# Patient Record
Sex: Male | Born: 1975 | Race: White | Hispanic: No | Marital: Married | Smoking: Former smoker
Health system: Southern US, Community
[De-identification: ages and names within clinical notes are randomized; demographics above are authoritative.]

## PROBLEM LIST (undated history)

## (undated) DIAGNOSIS — M549 Dorsalgia, unspecified: Secondary | ICD-10-CM

## (undated) DIAGNOSIS — C3490 Malignant neoplasm of unspecified part of unspecified bronchus or lung: Secondary | ICD-10-CM

## (undated) DIAGNOSIS — L309 Dermatitis, unspecified: Secondary | ICD-10-CM

## (undated) DIAGNOSIS — K219 Gastro-esophageal reflux disease without esophagitis: Secondary | ICD-10-CM

## (undated) DIAGNOSIS — G8929 Other chronic pain: Secondary | ICD-10-CM

## (undated) HISTORY — PX: OTHER SURGICAL HISTORY: SHX169

## (undated) HISTORY — PX: WISDOM TOOTH EXTRACTION: SHX21

## (undated) HISTORY — PX: PORTACATH PLACEMENT: SHX2246

---

## 2006-07-18 ENCOUNTER — Ambulatory Visit (HOSPITAL_COMMUNITY): Admission: RE | Admit: 2006-07-18 | Discharge: 2006-07-18 | Payer: Self-pay | Admitting: Family Medicine

## 2011-07-09 ENCOUNTER — Emergency Department (HOSPITAL_COMMUNITY)
Admission: EM | Admit: 2011-07-09 | Discharge: 2011-07-09 | Disposition: A | Discharge status: Home / Self Care | Payer: Worker's Compensation | Attending: Emergency Medicine | Admitting: Emergency Medicine

## 2011-07-09 ENCOUNTER — Emergency Department (HOSPITAL_COMMUNITY): Payer: Worker's Compensation

## 2011-07-09 ENCOUNTER — Encounter: Payer: Self-pay | Admitting: *Deleted

## 2011-07-09 DIAGNOSIS — S0180XA Unspecified open wound of other part of head, initial encounter: Secondary | ICD-10-CM | POA: Insufficient documentation

## 2011-07-09 DIAGNOSIS — S0083XA Contusion of other part of head, initial encounter: Secondary | ICD-10-CM

## 2011-07-09 DIAGNOSIS — S0003XA Contusion of scalp, initial encounter: Secondary | ICD-10-CM | POA: Insufficient documentation

## 2011-07-09 DIAGNOSIS — S0181XA Laceration without foreign body of other part of head, initial encounter: Secondary | ICD-10-CM

## 2011-07-09 DIAGNOSIS — S1093XA Contusion of unspecified part of neck, initial encounter: Secondary | ICD-10-CM | POA: Insufficient documentation

## 2011-07-09 DIAGNOSIS — IMO0002 Reserved for concepts with insufficient information to code with codable children: Secondary | ICD-10-CM | POA: Insufficient documentation

## 2011-07-09 DIAGNOSIS — F17200 Nicotine dependence, unspecified, uncomplicated: Secondary | ICD-10-CM | POA: Insufficient documentation

## 2011-07-09 MED ORDER — TETANUS-DIPHTH-ACELL PERTUSSIS 5-2.5-18.5 LF-MCG/0.5 IM SUSP
0.5000 mL | Freq: Once | INTRAMUSCULAR | Status: AC
  Administered 2011-07-09: 0.5 mL via INTRAMUSCULAR
  Filled 2011-07-09: qty 0.5

## 2011-07-09 NOTE — ED Notes (Signed)
Pt states was using a pri-bar bar at work when [employer] bar came loose and hit pt in nose causing a small laceration on lt side of nare and noted swelling at the bridge of nose. Denies LOC.

## 2011-07-09 NOTE — ED Provider Notes (Signed)
History     CSN: [id number] Arrival date & time: 07/09/2011  3:00 PM  Chief Complaint  Patient presents with  . Facial Injury   HPI Comments: Was using a prybar to open a door which slipped and struck him in the face.  Caused a small laceration to the left side of the nose.  Complains of nasal pain, swelling.  Last tdap 7 years ago.  No loc.  No other complaints.  Patient is a 35 y.o. male presenting with facial injury. The history is provided by the patient.  Facial Injury  The incident occurred just prior to arrival. The incident occurred at work. The injury mechanism was a direct blow. Pertinent negatives include no headaches and no light-headedness.    History reviewed. No pertinent past medical history.  History reviewed. No pertinent past surgical history.  History reviewed. No pertinent family history.  History  Substance Use Topics  . Smoking status: Current Everyday Smoker  . Smokeless tobacco: Not on file  . Alcohol Use: No      Review of Systems  Constitutional: Negative for chills and fatigue.  HENT: Positive for facial swelling.        As above  Respiratory: Negative for shortness of breath.   Neurological: Negative for dizziness, light-headedness and headaches.    Physical Exam  BP 121/76  Pulse 78  Temp(Src) 98 F (36.7 C) (Oral)  Resp 20  Ht 5\' 10"  (1.778 m)  Wt 180 lb (81.647 kg)  BMI 25.83 kg/m2  SpO2 100%  Physical Exam  Constitutional: He is oriented to person, place, and time. He appears well-developed and well-nourished. No distress.  HENT:  Head: Normocephalic.       There is a small laceration to the left side of the nose.  There is no deformity otherwise and there is no septal hematoma.    Eyes: EOM are normal. Pupils are equal, round, and reactive to light.  Neck: Normal range of motion. Neck supple.  Neurological: He is alert and oriented to person, place, and time.  Skin: He is not diaphoretic.    ED Course  Procedures  MDM No  fracture.  No sutures indicated.  Will discharge.      Geoffery Lyons, MD 07/09/11 [phone]

## 2011-07-09 NOTE — ED Notes (Signed)
Pt was working with a piece of equipment  when it flew back and hit pt in his nose, pt has laceration to left side of nose, no bleeding noted, denies any loc,

## 2013-01-30 ENCOUNTER — Emergency Department (HOSPITAL_COMMUNITY)
Admission: EM | Admit: 2013-01-30 | Discharge: 2013-01-30 | Disposition: A | Discharge status: Home / Self Care | Payer: Managed Care, Other (non HMO) | Attending: Emergency Medicine | Admitting: Emergency Medicine

## 2013-01-30 ENCOUNTER — Encounter (HOSPITAL_COMMUNITY): Payer: Self-pay

## 2013-01-30 DIAGNOSIS — T394X5A Adverse effect of antirheumatics, not elsewhere classified, initial encounter: Secondary | ICD-10-CM | POA: Insufficient documentation

## 2013-01-30 DIAGNOSIS — R21 Rash and other nonspecific skin eruption: Secondary | ICD-10-CM | POA: Insufficient documentation

## 2013-01-30 DIAGNOSIS — Z79899 Other long term (current) drug therapy: Secondary | ICD-10-CM | POA: Insufficient documentation

## 2013-01-30 DIAGNOSIS — T7840XA Allergy, unspecified, initial encounter: Secondary | ICD-10-CM

## 2013-01-30 DIAGNOSIS — Z872 Personal history of diseases of the skin and subcutaneous tissue: Secondary | ICD-10-CM | POA: Insufficient documentation

## 2013-01-30 DIAGNOSIS — L509 Urticaria, unspecified: Secondary | ICD-10-CM | POA: Insufficient documentation

## 2013-01-30 DIAGNOSIS — F17200 Nicotine dependence, unspecified, uncomplicated: Secondary | ICD-10-CM | POA: Insufficient documentation

## 2013-01-30 DIAGNOSIS — Z8739 Personal history of other diseases of the musculoskeletal system and connective tissue: Secondary | ICD-10-CM | POA: Insufficient documentation

## 2013-01-30 HISTORY — DX: Dorsalgia, unspecified: M54.9

## 2013-01-30 HISTORY — DX: Other chronic pain: G89.29

## 2013-01-30 HISTORY — DX: Dermatitis, unspecified: L30.9

## 2013-01-30 MED ORDER — DIPHENHYDRAMINE HCL 25 MG PO TABS: 25.0000 mg | ORAL_TABLET | Freq: Four times a day (QID) | ORAL | Status: DC | PRN

## 2013-01-30 MED ORDER — FAMOTIDINE 20 MG PO TABS
20.0000 mg | ORAL_TABLET | Freq: Once | ORAL | Status: AC
  Administered 2013-01-30: 20 mg via ORAL
  Filled 2013-01-30: qty 1

## 2013-01-30 MED ORDER — PREDNISONE 50 MG PO TABS
60.0000 mg | ORAL_TABLET | Freq: Once | ORAL | Status: AC
  Administered 2013-01-30: 60 mg via ORAL
  Filled 2013-01-30: qty 1

## 2013-01-30 MED ORDER — PREDNISONE 20 MG PO TABS: 40.0000 mg | ORAL_TABLET | Freq: Every day | ORAL | Status: DC

## 2013-01-30 MED ORDER — DIPHENHYDRAMINE HCL 25 MG PO CAPS
50.0000 mg | ORAL_CAPSULE | Freq: Once | ORAL | Status: AC
  Administered 2013-01-30: 50 mg via ORAL
  Filled 2013-01-30: qty 2

## 2013-01-30 MED ORDER — FAMOTIDINE 20 MG PO TABS: 20.0000 mg | ORAL_TABLET | Freq: Two times a day (BID) | ORAL | Status: DC

## 2013-01-30 NOTE — ED Provider Notes (Signed)
History  This chart was scribed for Cameron Roller, MD by Shari Heritage, ED Scribe. The patient was seen in room APA08/APA08. Patient's care was started at 0911.   CSN: [id number]  Arrival date & time 01/30/13  0902   First MD Initiated Contact with Patient 01/30/13 [phone]      Chief Complaint  Patient presents with  . Allergic Reaction     The history is provided by the patient. No language interpreter was used.    HPI Comments: Cameron Harper is a 37 y.o. male who presents to the Emergency Department complaining of a diffuse itching to arms, legs, chest and back; and hives to abdomen onset 2 hours ago. He has a history of eczema, but thinks that new urticarial rash may have developed due to medication reaction. Patient was prescribed diclofenac last year by his neurologist for treatment of chronic back pain. He says that he took the medicine this morning at 7 am for the first time in 6 months. He attributes current symptoms to this medicine. He denies use of any other medicines, new soaps, fabrics, lotions or detergents. He states that he hasn't eaten any new or unusual foods in the past several hours. No exposure to new animals or pets. He has no shortness of breath, chest tightness, throat swelling, trouble swallowing, or fever. Patient also denies chills, wheezing, chest pain, hematuria, blood in stool, abdominal pain, history of bleeding easily, blurred vision, myalgias or arthralgias. He has not taken any benadryl for symptom relief. Patient lives at home with his wife and two kids. He states that they are all asymptomatic. Patient is a current every day smoker. He denies drug or alcohol use.    Past Medical History  Diagnosis Date  . Eczema   . Chronic back pain     History reviewed. No pertinent past surgical history.  No family history on file.  History  Substance Use Topics  . Smoking status: Current Every Day Smoker    Types: Cigarettes  . Smokeless tobacco: Not on  file  . Alcohol Use: No      Review of Systems  Constitutional: Negative for fever and chills.  HENT: Negative for sore throat, trouble swallowing and neck pain.   Eyes: Negative for visual disturbance.  Respiratory: Negative for cough, chest tightness, shortness of breath and wheezing.   Cardiovascular: Negative for chest pain.  Gastrointestinal: Negative for nausea, vomiting, abdominal pain, diarrhea and blood in stool.  Genitourinary: Negative for dysuria, frequency and hematuria.  Musculoskeletal: Negative for myalgias, back pain and arthralgias.  Skin: Positive for rash.  Neurological: Negative for weakness, numbness and headaches.  Hematological: Negative for adenopathy. Does not bruise/bleed easily.  Psychiatric/Behavioral: Negative for behavioral problems.  All other systems reviewed and are negative.    Allergies  Penicillins  Home Medications   Current Outpatient Rx  Name  Route  Sig  Dispense  Refill  . acetaminophen (TYLENOL) 500 MG tablet   Oral   Take 1,000 mg by mouth once as needed. For headache           . diphenhydrAMINE (BENADRYL) 25 MG tablet   Oral   Take 1 tablet (25 mg total) by mouth every 6 (six) hours as needed for itching (Rash).   30 tablet   0   . famotidine (PEPCID) 20 MG tablet   Oral   Take 1 tablet (20 mg total) by mouth 2 (two) times daily.   30 tablet   0   .  predniSONE (DELTASONE) 20 MG tablet   Oral   Take 2 tablets (40 mg total) by mouth daily.   10 tablet   0     Triage Vitals: BP 130/69  Pulse 71  Temp(Src) 97.3 F (36.3 C) (Oral)  Resp 20  SpO2 98%  Physical Exam  Constitutional: He is oriented to person, place, and time. He appears well-developed and well-nourished.  HENT:  Head: Normocephalic and atraumatic.  Mouth/Throat: Oropharynx is clear and moist.  Eyes: Conjunctivae and EOM are normal. Pupils are equal, round, and reactive to light. No scleral icterus.  Neck: Normal range of motion. Neck supple.   Cardiovascular: Normal rate, regular rhythm and normal heart sounds.  Exam reveals no gallop and no friction rub.   No murmur heard. Pulmonary/Chest: Effort normal and breath sounds normal. No respiratory distress. He has no wheezes. He has no rales.  Abdominal: Soft. There is no tenderness.  Musculoskeletal: Normal range of motion.  Neurological: He is alert and oriented to person, place, and time.  Skin: Skin is warm and dry. Rash noted. Rash is urticarial.  Urticaria to lower abdomen. Excoriations noted throughout.    ED Course  Procedures (including critical care time) DIAGNOSTIC STUDIES: Oxygen Saturation is 98% on room air, normal by my interpretation.    COORDINATION OF CARE: 9:22 AM- Patient informed of current plan for treatment and evaluation and agrees with plan at this time.      Labs Reviewed - No data to display No results found.   1. Allergic reaction, initial encounter       MDM  37 yo male with apparent type 1 allergic reaction. Only exposure is Diclofenac. DDx: presumed allergic reaction to Diclofenac. Plan: d/c all NSAIDs including Diclofenac, Benadryl/Pepcid/Prenisone. Needs skin allergy testing. Told to return to ED if symptoms worsen or if respiratory symptoms develop.  NO signs of oral swelling, SOB or wheezing, rash has all but resolved, stable for d/c, recommended allergy testing, prednisone, pepcid, benadryl, stable, expresses understasnding.   I personally performed the services described in this documentation, which was scribed in my presence. The recorded information has been reviewed and is accurate.      Cameron Roller, MD 01/30/13 [phone]

## 2013-01-30 NOTE — ED Notes (Signed)
Pt arrived to er after new onset of itching, fine rash noted to abd, back, chest and arm area. Pt states that the itching started after taking diclofenac this am. Has had the medication before but has not taken it "in a while", denies any sob, resp problems, VSS,

## 2013-01-30 NOTE — ED Notes (Signed)
?  allergic reaction to diclofenac this am. Itching all over. No resp distress. Denies any other new meds, lotions or creams.

## 2013-01-30 NOTE — ED Notes (Signed)
Pt states that the itching is better, pt updated on plan of care,  

## 2014-10-07 ENCOUNTER — Encounter (HOSPITAL_COMMUNITY): Payer: Self-pay | Admitting: Emergency Medicine

## 2014-10-07 ENCOUNTER — Emergency Department (HOSPITAL_COMMUNITY): Payer: Worker's Compensation

## 2014-10-07 ENCOUNTER — Emergency Department (HOSPITAL_COMMUNITY)
Admission: EM | Admit: 2014-10-07 | Discharge: 2014-10-07 | Disposition: A | Discharge status: Home / Self Care | Payer: Worker's Compensation | Attending: Emergency Medicine | Admitting: Emergency Medicine

## 2014-10-07 DIAGNOSIS — S61221A Laceration with foreign body of left index finger without damage to nail, initial encounter: Secondary | ICD-10-CM | POA: Insufficient documentation

## 2014-10-07 DIAGNOSIS — Z872 Personal history of diseases of the skin and subcutaneous tissue: Secondary | ICD-10-CM | POA: Insufficient documentation

## 2014-10-07 DIAGNOSIS — Z87891 Personal history of nicotine dependence: Secondary | ICD-10-CM | POA: Diagnosis not present

## 2014-10-07 DIAGNOSIS — Z880 Allergy status to penicillin: Secondary | ICD-10-CM | POA: Diagnosis not present

## 2014-10-07 DIAGNOSIS — Y929 Unspecified place or not applicable: Secondary | ICD-10-CM | POA: Diagnosis not present

## 2014-10-07 DIAGNOSIS — G8929 Other chronic pain: Secondary | ICD-10-CM | POA: Insufficient documentation

## 2014-10-07 DIAGNOSIS — Z23 Encounter for immunization: Secondary | ICD-10-CM | POA: Insufficient documentation

## 2014-10-07 DIAGNOSIS — Z7952 Long term (current) use of systemic steroids: Secondary | ICD-10-CM | POA: Diagnosis not present

## 2014-10-07 DIAGNOSIS — W458XXA Other foreign body or object entering through skin, initial encounter: Secondary | ICD-10-CM | POA: Insufficient documentation

## 2014-10-07 DIAGNOSIS — Y9389 Activity, other specified: Secondary | ICD-10-CM | POA: Insufficient documentation

## 2014-10-07 DIAGNOSIS — M795 Residual foreign body in soft tissue: Secondary | ICD-10-CM

## 2014-10-07 DIAGNOSIS — Z79899 Other long term (current) drug therapy: Secondary | ICD-10-CM | POA: Insufficient documentation

## 2014-10-07 MED ORDER — POVIDONE-IODINE 10 % EX SOLN
CUTANEOUS | Status: AC
  Administered 2014-10-07: 15:00:00
  Filled 2014-10-07: qty 118

## 2014-10-07 MED ORDER — IBUPROFEN 800 MG PO TABS
800.0000 mg | ORAL_TABLET | Freq: Once | ORAL | Status: AC
  Administered 2014-10-07: 800 mg via ORAL
  Filled 2014-10-07: qty 1

## 2014-10-07 MED ORDER — LIDOCAINE HCL (PF) 2 % IJ SOLN
10.0000 mL | Freq: Once | INTRAMUSCULAR | Status: AC
  Administered 2014-10-07: 10 mL
  Filled 2014-10-07: qty 10

## 2014-10-07 MED ORDER — TETANUS-DIPHTH-ACELL PERTUSSIS 5-2.5-18.5 LF-MCG/0.5 IM SUSP
0.5000 mL | Freq: Once | INTRAMUSCULAR | Status: AC
  Administered 2014-10-07: 0.5 mL via INTRAMUSCULAR
  Filled 2014-10-07: qty 0.5

## 2014-10-07 NOTE — ED Notes (Signed)
4x4 drsg applied to finger.

## 2014-10-07 NOTE — ED Provider Notes (Signed)
CSN: [id number]     Arrival date & time 10/07/14  1406 History   First MD Initiated Contact with Patient 10/07/14 1419     Chief Complaint  Patient presents with  . Foreign Body in Skin     (Consider location/radiation/quality/duration/timing/severity/associated sxs/prior Treatment) HPI  Cameron Harper is a 38 y.o. male who presents to the Emergency Department complaining of metal staple embedded into the nail of the left index finger.  He states this is a work related injury that occurred while using a airgun stapler.  He c/o pain to his fingertip, but denies numbness or inability to move the end of his finger.  Minimal bleeding.  Pt is unsure of last Td.     Past Medical History  Diagnosis Date  . Eczema   . Chronic back pain    History reviewed. No pertinent past surgical history. No family history on file. History  Substance Use Topics  . Smoking status: Former Smoker    Types: Cigarettes  . Smokeless tobacco: Not on file  . Alcohol Use: No    Review of Systems  Constitutional: Negative for fever and chills.  Gastrointestinal: Negative for nausea and vomiting.  Genitourinary: Negative for dysuria and difficulty urinating.  Musculoskeletal: Positive for arthralgias. Negative for joint swelling.       Left index finger pain  Skin: Negative for color change and wound.  Neurological: Negative for weakness and numbness.  All other systems reviewed and are negative.     Allergies  Penicillins  Home Medications   Prior to Admission medications   Medication Sig Start Date End Date Taking? Authorizing Provider  acetaminophen (TYLENOL) 500 MG tablet Take 1,000 mg by mouth once as needed. For headache      Historical Provider, MD  diphenhydrAMINE (BENADRYL) 25 MG tablet Take 1 tablet (25 mg total) by mouth every 6 (six) hours as needed for itching (Rash). 01/30/13   Johnna Acosta, MD  famotidine (PEPCID) 20 MG tablet Take 1 tablet (20 mg total) by mouth 2 (two) times  daily. 01/30/13   Johnna Acosta, MD  predniSONE (DELTASONE) 20 MG tablet Take 2 tablets (40 mg total) by mouth daily. 01/30/13   Johnna Acosta, MD   BP 109/56 mmHg  Pulse 75  Temp(Src) 97.8 F (36.6 C) (Oral)  Resp 18  Ht 5\' 9"  (1.753 m)  Wt 185 lb (83.915 kg)  BMI 27.31 kg/m2  SpO2 100% Physical Exam  Constitutional: He is oriented to person, place, and time. He appears well-developed and well-nourished. No distress.  Cardiovascular: Normal rate, normal heart sounds and intact distal pulses.   No murmur heard. Pulmonary/Chest: Effort normal and breath sounds normal. No respiratory distress.  Musculoskeletal: He exhibits tenderness. He exhibits no edema.  One end of a metal staple embedded in the fingernail of the left index finger,  No active bleeding, distal sensation intact.  Pt has full ROM of the DIP.  No edema  Neurological: He is alert and oriented to person, place, and time. He exhibits normal muscle tone. Coordination normal.  Skin: Skin is warm and dry.  Nursing note and vitals reviewed.   ED Course  FOREIGN BODY REMOVAL Date/Time: 10/07/2014 2:30 PM Performed by: Vanessa [place], Yessika Otte L. Authorized by: Hale Bogus Consent: Verbal consent obtained. Risks and benefits: risks, benefits and alternatives were discussed Consent given by: patient Site marked: the operative site was marked Imaging studies: imaging studies available Patient identity confirmed: verbally with patient and arm band  Time out: Immediately prior to procedure a "time out" was called to verify the correct patient, procedure, equipment, support staff and site/side marked as required. Intake: left index finger. Anesthesia: digital block Local anesthetic: lidocaine 2% without epinephrine Anesthetic total: 2 ml Patient sedated: no Patient restrained: no Patient cooperative: yes Complexity: simple 1 objects recovered. Objects recovered: metal staple Post-procedure assessment: foreign body  removed Patient tolerance: Patient tolerated the procedure well with no immediate complications   (including critical care time) Labs Review Labs Reviewed - No data to display  Imaging Review Dg Finger Index Left  10/07/2014   CLINICAL DATA:  The patient shot a staple in his finger  EXAM: LEFT INDEX FINGER 2+V  COMPARISON:  None.  FINDINGS: There is no retained radiopaque foreign body. There is no acute fracture or dislocation. There is no significant soft tissue swelling. The bone mineralization appears grossly normal.  IMPRESSION: 1. No acute fracture or dislocation 2. No radiopaque foreign body.   Electronically Signed   By: Rosemarie Ax   On: 10/07/2014 15:02     EKG Interpretation None        MDM   Final diagnoses:  Foreign body (FB) in soft tissue    Staple removed w/o difficulty.  Mild bleeding upon staple removal that quickly subsided.   puncture wound of the nail bed.  Pt has full ROM of the distal finger, sensation intact.  No bony injury on XR.  Td updated, finger bandaged.  OTC ibuprofen if needed.    Sandee Bernath L. Vanessa [place], PA-C 10/08/14 2106  Shaune Pollack, MD 10/11/14 [phone]

## 2014-10-07 NOTE — ED Notes (Signed)
PT was using an airgun stapler today and a staple hit a piece of metal and came back and is present in left hand index finger in fingernail bed.

## 2015-05-31 ENCOUNTER — Other Ambulatory Visit (HOSPITAL_COMMUNITY): Payer: Self-pay | Admitting: Family Medicine

## 2015-05-31 ENCOUNTER — Ambulatory Visit (HOSPITAL_COMMUNITY)
Admission: RE | Admit: 2015-05-31 | Discharge: 2015-05-31 | Disposition: A | Discharge status: Home / Self Care | Payer: Managed Care, Other (non HMO) | Source: Ambulatory Visit | Attending: Family Medicine | Admitting: Family Medicine

## 2015-05-31 DIAGNOSIS — R05 Cough: Secondary | ICD-10-CM | POA: Diagnosis not present

## 2015-05-31 DIAGNOSIS — M5412 Radiculopathy, cervical region: Secondary | ICD-10-CM | POA: Diagnosis not present

## 2015-05-31 DIAGNOSIS — Z87891 Personal history of nicotine dependence: Secondary | ICD-10-CM | POA: Insufficient documentation

## 2015-05-31 DIAGNOSIS — R0989 Other specified symptoms and signs involving the circulatory and respiratory systems: Secondary | ICD-10-CM | POA: Insufficient documentation

## 2015-05-31 DIAGNOSIS — M542 Cervicalgia: Secondary | ICD-10-CM

## 2015-05-31 DIAGNOSIS — M25512 Pain in left shoulder: Secondary | ICD-10-CM

## 2017-03-25 ENCOUNTER — Other Ambulatory Visit (HOSPITAL_COMMUNITY): Payer: Self-pay | Admitting: Registered Nurse

## 2017-03-25 DIAGNOSIS — R7401 Elevation of levels of liver transaminase levels: Secondary | ICD-10-CM

## 2017-03-25 DIAGNOSIS — R740 Nonspecific elevation of levels of transaminase and lactic acid dehydrogenase [LDH]: Principal | ICD-10-CM

## 2017-04-02 ENCOUNTER — Ambulatory Visit (HOSPITAL_COMMUNITY)
Admission: RE | Admit: 2017-04-02 | Discharge: 2017-04-02 | Disposition: A | Discharge status: Home / Self Care | Payer: Managed Care, Other (non HMO) | Source: Ambulatory Visit | Attending: Registered Nurse | Admitting: Registered Nurse

## 2017-04-02 DIAGNOSIS — R740 Nonspecific elevation of levels of transaminase and lactic acid dehydrogenase [LDH]: Secondary | ICD-10-CM | POA: Insufficient documentation

## 2017-04-02 DIAGNOSIS — R7402 Elevation of levels of lactic acid dehydrogenase (LDH): Secondary | ICD-10-CM

## 2017-04-15 ENCOUNTER — Encounter: Payer: Self-pay | Admitting: Gastroenterology

## 2017-05-10 ENCOUNTER — Ambulatory Visit: Payer: Managed Care, Other (non HMO) | Admitting: Gastroenterology

## 2020-06-30 ENCOUNTER — Ambulatory Visit (HOSPITAL_COMMUNITY)
Admission: RE | Admit: 2020-06-30 | Discharge: 2020-06-30 | Disposition: A | Discharge status: Home / Self Care | Payer: Managed Care, Other (non HMO) | Source: Ambulatory Visit | Attending: Family Medicine | Admitting: Family Medicine

## 2020-06-30 ENCOUNTER — Other Ambulatory Visit (HOSPITAL_COMMUNITY): Payer: Self-pay | Admitting: Family Medicine

## 2020-06-30 ENCOUNTER — Other Ambulatory Visit: Payer: Self-pay

## 2020-06-30 DIAGNOSIS — R0789 Other chest pain: Secondary | ICD-10-CM | POA: Insufficient documentation

## 2020-07-24 ENCOUNTER — Other Ambulatory Visit: Payer: Self-pay

## 2020-07-24 ENCOUNTER — Ambulatory Visit
Admission: EM | Admit: 2020-07-24 | Discharge: 2020-07-24 | Disposition: A | Discharge status: Home / Self Care | Payer: Managed Care, Other (non HMO) | Attending: Family Medicine | Admitting: Family Medicine

## 2020-07-24 DIAGNOSIS — Z1152 Encounter for screening for COVID-19: Secondary | ICD-10-CM

## 2020-07-24 NOTE — ED Triage Notes (Signed)
Pt began feeling unwell and took home covid test and may have been positvie , needs pcr test for work

## 2020-07-25 LAB — SARS-COV-2, NAA 2 DAY TAT

## 2020-07-25 LAB — NOVEL CORONAVIRUS, NAA: SARS-CoV-2, NAA: DETECTED — AB

## 2020-09-09 ENCOUNTER — Encounter: Payer: Self-pay | Admitting: *Deleted

## 2020-09-12 ENCOUNTER — Ambulatory Visit: Payer: Managed Care, Other (non HMO) | Admitting: Cardiology

## 2020-10-18 ENCOUNTER — Ambulatory Visit: Payer: Managed Care, Other (non HMO) | Admitting: Cardiology

## 2020-11-15 ENCOUNTER — Other Ambulatory Visit (HOSPITAL_COMMUNITY): Payer: Self-pay | Admitting: Family Medicine

## 2020-11-15 ENCOUNTER — Other Ambulatory Visit: Payer: Self-pay | Admitting: Family Medicine

## 2020-11-15 DIAGNOSIS — M549 Dorsalgia, unspecified: Secondary | ICD-10-CM

## 2020-11-30 ENCOUNTER — Encounter (HOSPITAL_COMMUNITY): Payer: Self-pay

## 2020-11-30 ENCOUNTER — Ambulatory Visit (HOSPITAL_COMMUNITY): Payer: Managed Care, Other (non HMO)

## 2020-12-09 ENCOUNTER — Ambulatory Visit (HOSPITAL_COMMUNITY)
Admission: RE | Admit: 2020-12-09 | Discharge: 2020-12-09 | Disposition: A | Discharge status: Home / Self Care | Payer: Managed Care, Other (non HMO) | Source: Ambulatory Visit | Attending: Family Medicine | Admitting: Family Medicine

## 2020-12-09 ENCOUNTER — Other Ambulatory Visit: Payer: Self-pay

## 2020-12-09 DIAGNOSIS — M549 Dorsalgia, unspecified: Secondary | ICD-10-CM | POA: Diagnosis present

## 2021-01-26 ENCOUNTER — Ambulatory Visit (HOSPITAL_COMMUNITY)
Admission: RE | Admit: 2021-01-26 | Discharge: 2021-01-26 | Disposition: A | Discharge status: Home / Self Care | Payer: Managed Care, Other (non HMO) | Source: Ambulatory Visit | Attending: Family Medicine | Admitting: Family Medicine

## 2021-01-26 ENCOUNTER — Other Ambulatory Visit (HOSPITAL_COMMUNITY): Payer: Self-pay | Admitting: Family Medicine

## 2021-01-26 ENCOUNTER — Other Ambulatory Visit: Payer: Self-pay

## 2021-01-26 DIAGNOSIS — J208 Acute bronchitis due to other specified organisms: Secondary | ICD-10-CM | POA: Insufficient documentation

## 2021-03-06 ENCOUNTER — Encounter: Payer: Self-pay | Admitting: *Deleted

## 2021-03-06 ENCOUNTER — Encounter: Payer: Self-pay | Admitting: Cardiology

## 2021-03-06 NOTE — Progress Notes (Signed)
Cardiology Office Note  Date: 03/07/2021   ID: Cameron Harper, DOB 01-01-1976, MRN [id number]  PCP:  [name], MD  Cardiologist:  Rozann Lesches, MD Electrophysiologist:  None   Chief Complaint  Patient presents with  . Chest discomfort    History of Present Illness: Cameron Harper is a 45 y.o. male referred for cardiology consultation by Dr. Hilma Favors for evaluation of chest discomfort.  I reviewed the available records.  He tells me that he has had intermittent episodes of chest congestion and cough, associated shortness of breath and pleuritic chest discomfort.  He has been treated with antibiotics at least twice with improvement in symptoms, has pending evaluation with Pulmonary as well.  No baseline history of chronic lung disease or asthma.  He does state that he had COVID-19 in the fall of last year.  Chest x-ray in February showed no acute process.  He does not report any wheezing, no orthopnea or PND.  No syncope. Chest discomfort is largely pleuritic, did notice it when he was doing some yard work recently, since resolved.    ECG today shows sinus rhythm with LPFB, no significant ST segment changes.  Past Medical History:  Diagnosis Date  . Chronic back pain   . Eczema     Past Surgical History:  Procedure Laterality Date  . Spine injection     Pain control    Current Outpatient Medications  Medication Sig Dispense Refill  . LEVOFLOXACIN PO Take by mouth.    . naproxen sodium (ALEVE) 220 MG tablet Take 220 mg by mouth.     No current facility-administered medications for this visit.   Allergies:  Penicillins   Social History: The patient  reports that he has been smoking cigarettes. He has never used smokeless tobacco. He reports that he does not drink alcohol and does not use drugs.   Family History: The patient's family history includes CAD in his maternal grandmother; Diabetes in his maternal grandfather; Heart attack in his maternal grandmother.    ROS: No palpitations or syncope.  Physical Exam: VS:  BP 118/68   Pulse 88   Ht 5\' 9"  (1.753 m)   Wt 173 lb (78.5 kg)   SpO2 98%   BMI 25.55 kg/m , BMI Body mass index is 25.55 kg/m.  Wt Readings from Last 3 Encounters:  03/07/21 173 lb (78.5 kg)  10/07/14 185 lb (83.9 kg)  07/09/11 180 lb (81.6 kg)    General: Patient appears comfortable at rest. HEENT: Conjunctiva and lids normal, wearing a mask. Neck: Supple, no elevated JVP or carotid bruits, no thyromegaly. Lungs: Coarse inspiratory sounds, no wheezing, nonlabored breathing at rest. Cardiac: Regular rate and rhythm, no S3 or significant systolic murmur, no pericardial rub. Abdomen: Soft, bowel sounds present. Extremities: No pitting edema, distal pulses 2+. Skin: Warm and dry. Musculoskeletal: No kyphosis. Neuropsychiatric: Alert and oriented x3, affect grossly appropriate.  ECG:  No prior tracings for review.  Recent Labwork:  No lab work for review today.  Other Studies Reviewed Today:  Chest x-ray 01/26/2021: FINDINGS: The heart size and mediastinal contours are within normal limits. Both lungs are clear. The visualized skeletal structures are unremarkable.  IMPRESSION: No active cardiopulmonary disease.  Assessment and Plan:  1.  Atypical chest pain as outlined above.  ECG reviewed and overall nonspecific.  He has not undergone any prior ischemic testing, will arrange a basic GXT for screening evaluation at age 33.  2.  Recurring chest congestion, cough and  possible URI symptoms.  Reports history of COVID-19 in the fall of last year.  He has pending consultation with Pulmonary.  Echocardiogram will be obtained to ensure structurally normal heart.  Medication Adjustments/Labs and Tests Ordered: Current medicines are reviewed at length with the patient today.  Concerns regarding medicines are outlined above.   Tests Ordered: Orders Placed This Encounter  Procedures  . EXERCISE TOLERANCE TEST (ETT)   . EKG 12-Lead  . ECHOCARDIOGRAM COMPLETE    Medication Changes: No orders of the defined types were placed in this encounter.   Disposition:  Follow up test results.  Signed, Satira Sark, MD, [facility] 03/07/2021 10:52 AM    [facility] [place], [place], [place] [address] Phone: [phone]; Fax: [phone]

## 2021-03-07 ENCOUNTER — Encounter: Payer: Self-pay | Admitting: *Deleted

## 2021-03-07 ENCOUNTER — Other Ambulatory Visit: Payer: Self-pay

## 2021-03-07 ENCOUNTER — Ambulatory Visit: Payer: Managed Care, Other (non HMO) | Admitting: Cardiology

## 2021-03-07 ENCOUNTER — Encounter: Payer: Self-pay | Admitting: Cardiology

## 2021-03-07 VITALS — BP 118/68 | HR 88 | Ht 69.0 in | Wt 173.0 lb

## 2021-03-07 DIAGNOSIS — R0602 Shortness of breath: Secondary | ICD-10-CM | POA: Diagnosis not present

## 2021-03-07 DIAGNOSIS — R0789 Other chest pain: Secondary | ICD-10-CM | POA: Diagnosis not present

## 2021-03-07 NOTE — Patient Instructions (Addendum)
Medication Instructions:   Your physician recommends that you continue on your current medications as directed. Please refer to the Current Medication list given to you today.  Labwork:  Covid test before stress test.Quarantine afterwards until stress test completed  Testing/Procedures: Your physician has requested that you have an echocardiogram. Echocardiography is a painless test that uses sound waves to create images of your heart. It provides your doctor with information about the size and shape of your heart and how well your heart's chambers and valves are working. This procedure takes approximately one hour. There are no restrictions for this procedure. Your physician has requested that you have an exercise tolerance test. For further information please visit [email or web address]. Please also follow instruction sheet, as given.   Follow-Up:  Your physician recommends that you schedule a follow-up appointment in: pending.  Any Other Special Instructions Will Be Listed Below (If Applicable).  If you need a refill on your cardiac medications before your next appointment, please call your pharmacy.

## 2021-03-13 ENCOUNTER — Other Ambulatory Visit (HOSPITAL_COMMUNITY)
Admission: RE | Admit: 2021-03-13 | Discharge: 2021-03-13 | Disposition: A | Discharge status: Home / Self Care | Payer: Managed Care, Other (non HMO) | Source: Ambulatory Visit | Attending: Cardiology | Admitting: Cardiology

## 2021-03-13 ENCOUNTER — Other Ambulatory Visit: Payer: Self-pay

## 2021-03-13 ENCOUNTER — Ambulatory Visit (INDEPENDENT_AMBULATORY_CARE_PROVIDER_SITE_OTHER): Payer: Managed Care, Other (non HMO)

## 2021-03-13 DIAGNOSIS — I361 Nonrheumatic tricuspid (valve) insufficiency: Secondary | ICD-10-CM

## 2021-03-13 DIAGNOSIS — Z20822 Contact with and (suspected) exposure to covid-19: Secondary | ICD-10-CM | POA: Insufficient documentation

## 2021-03-13 DIAGNOSIS — Z01812 Encounter for preprocedural laboratory examination: Secondary | ICD-10-CM | POA: Diagnosis not present

## 2021-03-13 DIAGNOSIS — R0602 Shortness of breath: Secondary | ICD-10-CM

## 2021-03-14 LAB — ECHOCARDIOGRAM COMPLETE
AR max vel: 2.41 cm2
AV Area VTI: 2.46 cm2
AV Area mean vel: 2.42 cm2
AV Mean grad: 3 mmHg
AV Peak grad: 6.2 mmHg
Ao pk vel: 1.25 m/s
Area-P 1/2: 4.86 cm2
Calc EF: 57.8 %
S' Lateral: 2.86 cm
Single Plane A2C EF: 60.7 %
Single Plane A4C EF: 57.1 %

## 2021-03-14 LAB — SARS CORONAVIRUS 2 (TAT 6-24 HRS): SARS Coronavirus 2: NEGATIVE

## 2021-03-15 ENCOUNTER — Other Ambulatory Visit: Payer: Self-pay

## 2021-03-15 ENCOUNTER — Ambulatory Visit (HOSPITAL_COMMUNITY)
Admission: RE | Admit: 2021-03-15 | Discharge: 2021-03-15 | Disposition: A | Discharge status: Home / Self Care | Payer: Managed Care, Other (non HMO) | Source: Ambulatory Visit | Attending: Cardiology | Admitting: Cardiology

## 2021-03-15 DIAGNOSIS — R0789 Other chest pain: Secondary | ICD-10-CM | POA: Diagnosis present

## 2021-03-15 LAB — EXERCISE TOLERANCE TEST
Estimated workload: 7.8 METS
Exercise duration (min): 6 min
Exercise duration (sec): 35 s
MPHR: 173 {beats}/min
Peak HR: 153 {beats}/min
Percent HR: 88 %
RPE: 16
Rest HR: 83 {beats}/min

## 2021-03-16 ENCOUNTER — Telehealth: Payer: Self-pay | Admitting: *Deleted

## 2021-03-16 NOTE — Telephone Encounter (Signed)
-----   Message from Satira Sark, MD sent at 03/15/2021  4:33 PM EDT ----- Please let him know stress test was normal, reassuring. No further cardiac testing is planned. Would keep follow-up with PCP.

## 2021-03-16 NOTE — Telephone Encounter (Signed)
Patient informed. Copy sent to PCP °

## 2021-03-16 NOTE — Telephone Encounter (Signed)
-----   Message from Satira Sark, MD sent at 03/14/2021  5:02 PM EDT ----- Results reviewed. Normal cardiac function with LVEF 60-65%. No significant valvular abnormalities. GXT pending.

## 2021-03-20 ENCOUNTER — Encounter (HOSPITAL_COMMUNITY): Payer: Managed Care, Other (non HMO)

## 2021-04-03 ENCOUNTER — Ambulatory Visit (INDEPENDENT_AMBULATORY_CARE_PROVIDER_SITE_OTHER): Payer: Managed Care, Other (non HMO) | Admitting: Internal Medicine

## 2021-04-03 ENCOUNTER — Encounter: Payer: Self-pay | Admitting: Internal Medicine

## 2021-04-03 ENCOUNTER — Other Ambulatory Visit (HOSPITAL_COMMUNITY)
Admission: RE | Admit: 2021-04-03 | Discharge: 2021-04-03 | Disposition: A | Discharge status: Home / Self Care | Payer: Managed Care, Other (non HMO) | Source: Ambulatory Visit | Attending: Internal Medicine | Admitting: Internal Medicine

## 2021-04-03 ENCOUNTER — Other Ambulatory Visit: Payer: Self-pay

## 2021-04-03 DIAGNOSIS — F17210 Nicotine dependence, cigarettes, uncomplicated: Secondary | ICD-10-CM

## 2021-04-03 DIAGNOSIS — R053 Chronic cough: Secondary | ICD-10-CM | POA: Diagnosis not present

## 2021-04-03 LAB — CBC WITH DIFFERENTIAL/PLATELET
Abs Immature Granulocytes: 0.03 10*3/uL (ref 0.00–0.07)
Basophils Absolute: 0.1 10*3/uL (ref 0.0–0.1)
Basophils Relative: 1 %
Eosinophils Absolute: 0.3 10*3/uL (ref 0.0–0.5)
Eosinophils Relative: 4 %
HCT: 44.5 % (ref 39.0–52.0)
Hemoglobin: 14.2 g/dL (ref 13.0–17.0)
Immature Granulocytes: 0 %
Lymphocytes Relative: 22 %
Lymphs Abs: 1.9 10*3/uL (ref 0.7–4.0)
MCH: 29.6 pg (ref 26.0–34.0)
MCHC: 31.9 g/dL (ref 30.0–36.0)
MCV: 92.9 fL (ref 80.0–100.0)
Monocytes Absolute: 0.7 10*3/uL (ref 0.1–1.0)
Monocytes Relative: 8 %
Neutro Abs: 5.5 10*3/uL (ref 1.7–7.7)
Neutrophils Relative %: 65 %
Platelets: 266 10*3/uL (ref 150–400)
RBC: 4.79 MIL/uL (ref 4.22–5.81)
RDW: 12.6 % (ref 11.5–15.5)
WBC: 8.4 10*3/uL (ref 4.0–10.5)
nRBC: 0 % (ref 0.0–0.2)

## 2021-04-03 LAB — SEDIMENTATION RATE: Sed Rate: 35 mm/hr — ABNORMAL HIGH (ref 0–16)

## 2021-04-03 MED ORDER — BENZONATATE 200 MG PO CAPS: 200.0000 mg | ORAL_CAPSULE | Freq: Three times a day (TID) | ORAL | 1 refills | Status: DC | PRN

## 2021-04-03 MED ORDER — METHYLPREDNISOLONE ACETATE 80 MG/ML IJ SUSP
120.0000 mg | Freq: Once | INTRAMUSCULAR | Status: AC
  Administered 2021-04-03: 120 mg via INTRAMUSCULAR

## 2021-04-03 MED ORDER — PANTOPRAZOLE SODIUM 40 MG PO TBEC: 40.0000 mg | DELAYED_RELEASE_TABLET | Freq: Every day | ORAL | 2 refills | Status: DC

## 2021-04-03 MED ORDER — FAMOTIDINE 20 MG PO TABS: ORAL_TABLET | ORAL | 11 refills | Status: DC

## 2021-04-03 NOTE — Assessment & Plan Note (Signed)
Counseled re importance of smoking cessation but did not meet time criteria for separate billing   °

## 2021-04-03 NOTE — Patient Instructions (Addendum)
The key is to stop smoking completely before smoking completely stops you!   Pantoprazole (protonix) 40 mg   Take  30-60 min before first meal of the day and Pepcid (famotidine)  20 mg one after supper  until return to office - this is the best way to tell whether stomach acid is contributing to your problem.     For drainage / throat tickle try take CHLORPHENIRAMINE  4 mg  (Chlortab 4mg   at [employer] should be easiest to find in the green box)  take one every 4 hours as needed - available over the counter- may cause drowsiness so start with just a  dose or two an hour before bedtime  and see how you tolerate it before trying in daytime     GERD (REFLUX)  is an extremely common cause of respiratory symptoms just like yours , many times with no obvious heartburn at all.    It can be treated with medication, but also with lifestyle changes including elevation of the head of your bed (ideally with 6-8inch blocks under the headboard of your bed),  Smoking cessation, avoidance of late meals, excessive alcohol, and avoid fatty foods, chocolate, peppermint, colas, red wine, and acidic juices such as orange juice.  NO MINT OR MENTHOL PRODUCTS SO NO COUGH DROPS  USE SUGARLESS CANDY INSTEAD (Jolley ranchers or Stover's or Life Savers) or even ice chips will also do - the key is to swallow to prevent all throat clearing. NO OIL BASED VITAMINS - use powdered substitutes.  Avoid fish oil when coughing.   For cough > tessalon 200 mg every 6 hours as needed   Depomedrol 120 mg today   Please schedule a follow up office visit in 2 weeks, sooner if needed

## 2021-04-03 NOTE — Assessment & Plan Note (Signed)
Onset Feb 2022  - Allergy profile 04/03/2021 >  Eos 0.3 /  IgE   - max rx for gerd plus tessalon  04/03/2021 >>>   Worse on breztri, generally better nocturnally with pseudowheeze on exam all favor over asthma the dx of Upper airway cough syndrome (previously labeled PNDS),  is so named because it's frequently impossible to sort out how much is  CR/sinusitis with freq throat clearing (which can be related to primary GERD)   vs  causing  secondary (" extra esophageal")  GERD from wide swings in gastric pressure that occur with throat clearing, often  promoting self use of mint and menthol lozenges that reduce the lower esophageal sphincter tone and exacerbate the problem further in a cyclical fashion.   These are the same pts (now being labeled as having "irritable larynx syndrome" by some cough centers) who not infrequently have a history of having failed to tolerate ace inhibitors,  dry powder inhalers or biphosphonates or report having atypical/extraesophageal reflux symptoms that don't respond to standard doses of PPI  and are easily confused as having aecopd or asthma flares by even experienced allergists/ pulmonologists (myself included).    Of the three most common causes of  Sub-acute / recurrent or chronic cough, only one (GERD)  can actually contribute to/ trigger  the other two (asthma and post nasal drip syndrome)  and perpetuate the cylce of cough.  While not intuitively obvious, many patients with chronic low grade reflux do not cough until there is a primary insult that disturbs the protective epithelial barrier and exposes sensitive nerve endings.   This is typically viral but can due to PNDS and  either may apply here.   The point is that once this occurs, it is difficult to eliminate the cycle  using anything but a maximally effective acid suppression regimen at least in the short run, accompanied by an appropriate diet to address non acid GERD and control / eliminate the cough itself for at  least 3 days with tessalon plus  depomedrol 120 mg IM   in case of component of Th-2 driven upper or lower airways inflammation (if cough responds short term only to relapse before return while will on full rx for uacs (as above), then  that would point to allergic rhinitis/ asthma or eos bronchitis as alternative dx)           Each maintenance medication was reviewed in detail including emphasizing most importantly the difference between maintenance and prns and under what circumstances the prns are to be triggered using an action plan format where appropriate.  Total time for H and P, chart review, counseling,  and generating customized AVS unique to this office visit / same day charting = 50 min

## 2021-04-03 NOTE — Progress Notes (Signed)
Cameron Harper, male    DOB: 08-15-1976,   MRN: [id number]   Brief patient profile:  60 yowm active smoker with new springtime rhinitis/ sneezing x 4098-11 complicated ? Sinusitis > better p abx / prednisone then covid Sept 2021 p vax April 2021 c/b chronic cough > better on breztri  X one week then stopped and did fine until Feb 2022 gradually worse cough > ? Sinusitis per PCP > rx abx and completely better for just a few weeks then cough > retried breztri felt worse p one puff so did not take it again and referred to pulmonary clinic 04/03/2021 by Dr  Cameron Harper for refractory cough and mild doe.      History of Present Illness  04/03/2021  Pulmonary/ 1st office eval/ Cameron Harper / [facility] Office  Chief Complaint  Patient presents with  . Pulmonary Consult    Referred by Dr Cameron Harper. Pt c/o cough and chest tightness since Feb 2022. Cough is non prod and worse in the evening and at night.   Dyspnea:  MMRC1 = can walk nl pace, flat grade, can't hurry or go uphills or steps s sob   Cough: dry but globus sedation worse before supper and uses cough drops  Sleep: wakes up several  Nights per week  / on side /bed is flat/  SABA use: none  Takes advil x 2 (or one aleve) daily  for back and chest discomfort R abd pain with coughing only  No obvious day to day or daytime variability or assoc excess/ purulent sputum or mucus plugs or hemoptysis or cp or   subjective wheeze or overt sinus or hb symptoms.     Also denies any obvious fluctuation of symptoms with weather or environmental changes or other aggravating or alleviating factors except as outlined above   No unusual exposure hx or h/o childhood pna/ asthma or knowledge of premature birth.  Current Allergies, Complete Past Medical History, Past Surgical History, Family History, and Social History were reviewed in [employer] record.  ROS  The following are not active complaints unless bolded Hoarseness, sore throat,  dysphagia, dental problems, itching, sneezing,  nasal congestion or discharge of excess mucus or purulent secretions, ear ache,   fever, chills, sweats, unintended wt loss or wt gain, classically pleuritic or exertional cp,  orthopnea pnd or arm/hand swelling  or leg swelling, presyncope, palpitations, abdominal pain, anorexia, nausea, vomiting, diarrhea  or change in bowel habits or change in bladder habits, change in stools or change in urine, dysuria, hematuria,  rash, arthralgias, visual complaints, headache, numbness, weakness or ataxia or problems with walking or coordination,  change in mood or  memory.           Past Medical History:  Diagnosis Date  . Chronic back pain   . Eczema     Outpatient Medications Prior to Visit  Medication Sig Dispense Refill  . naproxen sodium (ALEVE) 220 MG tablet Take 220 mg by mouth 2 (two) times daily as needed.    . Pseudoeph-Doxylamine-DM-APAP (NYQUIL PO) Take by mouth as needed.               Objective:     BP 106/60 (BP Location: Left Arm, Cuff Size: Normal)   Pulse 85   Temp 97.6 F (36.4 C) (Temporal)   Ht 5\' 9"  (1.753 m)   Wt 171 lb (77.6 kg)   SpO2 100% Comment: on RA  BMI 25.25 kg/m   SpO2: 100 % (  on RA)  slt hoarse amb wm nad with classic pseudowheeze.    HEENT : pt wearing mask not removed for exam due to covid -19 concerns.    NECK :  without JVD/Nodes/TM/ nl carotid upstrokes bilaterally   LUNGS: no acc muscle use,  Nl contour chest which is clear to A and P bilaterally without cough on insp or exp maneuvers   CV:  RRR  no s3 or murmur or increase in P2, and no edema   ABD:  soft and nontender with nl inspiratory excursion in the supine position. No bruits or organomegaly appreciated, bowel sounds nl  MS:  Nl gait/ ext warm without deformities, calf tenderness, cyanosis or clubbing No obvious joint restrictions   SKIN: warm and dry without lesions    NEURO:  alert, approp, nl sensorium with  no motor or  cerebellar deficits apparent.    I personally reviewed images and agree with radiology impression as follows:  CXR:  Pa and lateral 01/26/21  No active cardiopulmonary disease.     Assessment   Chronic cough Onset Feb 2022  - Allergy profile 04/03/2021 >  Eos 0.3 /  IgE   - max rx for gerd plus tessalon  04/03/2021 >>>   Worse on breztri, generally better nocturnally with pseudowheeze on exam all favor over asthma the dx of Upper airway cough syndrome (previously labeled PNDS),  is so named because it's frequently impossible to sort out how much is  CR/sinusitis with freq throat clearing (which can be related to primary GERD)   vs  causing  secondary (" extra esophageal")  GERD from wide swings in gastric pressure that occur with throat clearing, often  promoting self use of mint and menthol lozenges that reduce the lower esophageal sphincter tone and exacerbate the problem further in a cyclical fashion.   These are the same pts (now being labeled as having "irritable larynx syndrome" by some cough centers) who not infrequently have a history of having failed to tolerate ace inhibitors,  dry powder inhalers or biphosphonates or report having atypical/extraesophageal reflux symptoms that don't respond to standard doses of PPI  and are easily confused as having aecopd or asthma flares by even experienced allergists/ pulmonologists (myself included).    Of the three most common causes of  Sub-acute / recurrent or chronic cough, only one (GERD)  can actually contribute to/ trigger  the other two (asthma and post nasal drip syndrome)  and perpetuate the cylce of cough.  While not intuitively obvious, many patients with chronic low grade reflux do not cough until there is a primary insult that disturbs the protective epithelial barrier and exposes sensitive nerve endings.   This is typically viral but can due to PNDS and  either may apply here.   The point is that once this occurs, it is difficult to  eliminate the cycle  using anything but a maximally effective acid suppression regimen at least in the short run, accompanied by an appropriate diet to address non acid GERD and control / eliminate the cough itself for at least 3 days with tessalon plus  depomedrol 120 mg IM   in case of component of Th-2 driven upper or lower airways inflammation (if cough responds short term only to relapse before return while will on full rx for uacs (as above), then  that would point to allergic rhinitis/ asthma or eos bronchitis as alternative dx)    Cigarette smoking Counseled re importance of smoking cessation but did  not meet time criteria for separate billing      Each maintenance medication was reviewed in detail including emphasizing most importantly the difference between maintenance and prns and under what circumstances the prns are to be triggered using an action plan format where appropriate.  Total time for H and P, chart review, counseling,  and generating customized AVS unique to this office visit / same day charting = 50 min           Christinia Gully, MD 04/03/2021

## 2021-04-04 ENCOUNTER — Other Ambulatory Visit (HOSPITAL_COMMUNITY): Payer: Managed Care, Other (non HMO)

## 2021-04-07 ENCOUNTER — Telehealth: Payer: Self-pay | Admitting: Internal Medicine

## 2021-04-07 NOTE — Telephone Encounter (Signed)
Called and updated patient on Dr Gustavus Bryant response/recommendations. All questions answered and patient expressed full understanding. Nothing further needed at this time.

## 2021-04-07 NOTE — Telephone Encounter (Signed)
I don't worry about esr of anything < 40  And will let him know once all labs are back on any change in recs

## 2021-04-07 NOTE — Telephone Encounter (Signed)
Spoke with the pt  He is calling about labs  IGE still pending but he reviewed them in [place] and wants to know if MW concerned about sed rate of 35 Please advise thanks

## 2021-04-09 LAB — IGE: IgE (Immunoglobulin E), Serum: 36 IU/mL (ref 6–495)

## 2021-04-10 ENCOUNTER — Encounter: Payer: Self-pay | Admitting: Internal Medicine

## 2021-04-10 NOTE — Progress Notes (Signed)
Letter mailed

## 2021-04-18 ENCOUNTER — Encounter: Payer: Self-pay | Admitting: Internal Medicine

## 2021-04-18 ENCOUNTER — Ambulatory Visit: Payer: Managed Care, Other (non HMO) | Admitting: Internal Medicine

## 2021-04-18 ENCOUNTER — Ambulatory Visit (HOSPITAL_COMMUNITY)
Admission: RE | Admit: 2021-04-18 | Discharge: 2021-04-18 | Disposition: A | Discharge status: Home / Self Care | Payer: Managed Care, Other (non HMO) | Source: Ambulatory Visit | Attending: Internal Medicine | Admitting: Internal Medicine

## 2021-04-18 ENCOUNTER — Other Ambulatory Visit: Payer: Self-pay | Admitting: Internal Medicine

## 2021-04-18 ENCOUNTER — Other Ambulatory Visit: Payer: Self-pay

## 2021-04-18 DIAGNOSIS — R053 Chronic cough: Secondary | ICD-10-CM

## 2021-04-18 DIAGNOSIS — R918 Other nonspecific abnormal finding of lung field: Secondary | ICD-10-CM | POA: Insufficient documentation

## 2021-04-18 DIAGNOSIS — F17210 Nicotine dependence, cigarettes, uncomplicated: Secondary | ICD-10-CM

## 2021-04-18 MED ORDER — METHYLPREDNISOLONE ACETATE 80 MG/ML IJ SUSP
120.0000 mg | Freq: Once | INTRAMUSCULAR | Status: AC
  Administered 2021-04-18: 120 mg via INTRAMUSCULAR

## 2021-04-18 NOTE — Assessment & Plan Note (Signed)
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            Each maintenance medication was reviewed in detail including emphasizing most importantly the difference between maintenance and prns and under what circumstances the prns are to be triggered using an action plan format where appropriate.  Total time for H and P, chart review, counseling, reviewing 30 min  device(s) and generating customized AVS unique to this office visit / same day charting  > 30 min

## 2021-04-18 NOTE — Progress Notes (Signed)
Cameron Harper, male    DOB: 26-Mar-1976,   MRN: [id number]   Brief patient profile:  68 yowm active smoker with new  rhinitis/ sneezing x 9528-41 complicated ? Sinusitis > resolved  p abx / prednisone then covid Sept 2021 p vax April 2021 c/b chronic cough >improved on breztri  X one week then stopped and did fine until Feb 2022 gradually worse cough/nasal discharge/generalized facial pressure  > ? Sinusitis per PCP > rx abx and completely better for just a few weeks then cough s assoc nasal symptoms > retried breztri felt worse p one puff so did not take it again and referred to pulmonary clinic 04/03/2021 by Dr  Hilma Favors for refractory cough and mild doe.      History of Present Illness  04/03/2021  Pulmonary/ 1st office eval/ Cameron Harper / [facility] Office  Chief Complaint  Patient presents with  . Pulmonary Consult    Referred by Dr [name]. Pt c/o cough and chest tightness since Feb 2022. Cough is non prod and worse in the evening and at night.   Dyspnea:  MMRC1 = can walk nl pace, flat grade, can't hurry or go uphills or steps s sob   Cough: dry but globus sedation worse before supper and uses cough drops  Sleep: wakes up several  Nights per week with cough  / on side /bed is flat/  SABA use: none  Takes advil x 2 (or one aleve) daily  for back and chest discomfort R abd pain with coughing only rec The key is to stop smoking completely before smoking completely stops you! Pantoprazole (protonix) 40 mg   Take  30-60 min before first meal of the day and Pepcid (famotidine)  20 mg one after supper   For drainage / throat tickle try take CHLORPHENIRAMINE  4 mg   GERD diet  For cough > tessalon 200 mg every 6 hours as needed  Depomedrol 120 mg today    - Allergy profile 04/03/2021 >  Eos 0.3 /  IgE   32    04/18/2021  f/u ov/[place] office/Cameron Harper re: uacs  Chief Complaint  Patient presents with  . Follow-up    Cough is much better, but still present and worse in late evening and  early am. Also, heavy exertion can trigger cough. Cough is non prod.   Dyspnea:  Better  Cough: no change on Tessalon/ worse p stirs in am  Sleeping: bed blocks/ no noct cough p one chlotrimeton  SABA use: none  02: none  Covid status: vax x 2 and had infection  Lung cancer screening: n/a    No obvious day to day or daytime variability or assoc excess/ purulent sputum or mucus plugs or hemoptysis or cp or chest tightness, subjective wheeze or overt sinus or hb symptoms.   Sleeping  without nocturnal  or early am exacerbation  of respiratory  c/o's or need for noct saba. Also denies any obvious fluctuation of symptoms with weather or environmental changes or other aggravating or alleviating factors except as outlined above   No unusual exposure hx or h/o childhood pna/ asthma or knowledge of premature birth.  Current Allergies, Complete Past Medical History, Past Surgical History, Family History, and Social History were reviewed in [employer] record.  ROS  The following are not active complaints unless bolded Hoarseness, sore throat, dysphagia, dental problems, itching, sneezing,  nasal congestion or discharge of excess mucus or purulent secretions, ear ache,   fever,  chills, sweats, unintended wt loss or wt gain, classically pleuritic or exertional cp,  orthopnea pnd or arm/hand swelling  or leg swelling, presyncope, palpitations, abdominal pain, anorexia, nausea, vomiting, diarrhea  or change in bowel habits or change in bladder habits, change in stools or change in urine, dysuria, hematuria,  rash, arthralgias, visual complaints, headache, numbness, weakness or ataxia or problems with walking or coordination,  change in mood or  memory.        Current Meds  Medication Sig  . chlorpheniramine (CHLOR-TRIMETON) 4 MG tablet Take 4 mg by mouth every 4 (four) hours as needed for allergies.  . famotidine (PEPCID) 20 MG tablet One after supper  . ibuprofen (ADVIL) 200 MG  tablet Take 200 mg by mouth every 6 (six) hours as needed.  . pantoprazole (PROTONIX) 40 MG tablet Take 1 tablet (40 mg total) by mouth daily. Take 30-60 min before first meal of the day               Past Medical History:  Diagnosis Date  . Chronic back pain   . Eczema     Objective:       Wt Readings from Last 3 Encounters:  04/18/21 170 lb (77.1 kg)  04/03/21 171 lb (77.6 kg)  03/07/21 173 lb (78.5 kg)     Vital signs reviewed  04/18/2021  - Note at rest 02 sats  99% on RA   General appearance:    amb wm with prominent "pseudowheeze"     HEENT : pt wearing mask not removed for exam due to covid -19 concerns.    NECK :  without JVD/Nodes/TM/ nl carotid upstrokes bilaterally   LUNGS: no acc muscle use,  Nl contour chest which is clear to A and P bilaterally without cough on insp or exp maneuvers   CV:  RRR  no s3 or murmur or increase in P2, and no edema   ABD:  soft and nontender with nl inspiratory excursion in the supine position. No bruits or organomegaly appreciated, bowel sounds nl  MS:  Nl gait/ ext warm without deformities, calf tenderness, cyanosis  - subtle  clubbing No obvious joint restrictions   SKIN: warm and dry without lesions    NEURO:  alert, approp, nl sensorium with  no motor or cerebellar deficits apparent.     CXR PA and Lateral:   04/18/2021 :    I personally reviewed images and agree with radiology impression as follows:    Mediastinal mass is suspected overlying the left hilar region. Further evaluation with contrast-enhanced chest CT recommended.                Assessment

## 2021-04-18 NOTE — Assessment & Plan Note (Signed)
Onset Feb 2022  - Allergy profile 04/03/2021 >  Eos 0.3 /  IgE   32 - max rx for gerd plus tessalon  04/03/2021 >> some better 04/18/2021 but clubbing on exam and L hilar mass clearly present on cxr > see lung mass

## 2021-04-18 NOTE — Progress Notes (Signed)
PCC's aware to cancel ENT referral and order for ct with cm and bmet was placed.

## 2021-04-18 NOTE — H&P (View-Only) (Signed)
Cameron Harper, male    DOB: 03/26/1976,   MRN: [id number]   Brief patient profile:  62 yowm active smoker with new  rhinitis/ sneezing x 8469-62 complicated ? Sinusitis > resolved  p abx / prednisone then covid Sept 2021 p vax April 2021 c/b chronic cough >improved on breztri  X one week then stopped and did fine until Feb 2022 gradually worse cough/nasal discharge/generalized facial pressure  > ? Sinusitis per PCP > rx abx and completely better for just a few weeks then cough s assoc nasal symptoms > retried breztri felt worse p one puff so did not take it again and referred to pulmonary clinic 04/03/2021 by Cameron Harper for refractory cough and mild doe.      History of Present Illness  04/03/2021  Pulmonary/ 1st office eval/ Cameron Harper / [facility] Office  Chief Complaint  Patient presents with  . Pulmonary Consult    Referred by Cameron Harper. Pt c/o cough and chest tightness since Feb 2022. Cough is non prod and worse in the evening and at night.   Dyspnea:  MMRC1 = can walk nl pace, flat grade, can't hurry or go uphills or steps s sob   Cough: dry but globus sedation worse before supper and uses cough drops  Sleep: wakes up several  Nights per week with cough  / on side /bed is flat/  SABA use: none  Takes advil x 2 (or one aleve) daily  for back and chest discomfort R abd pain with coughing only rec The key is to stop smoking completely before smoking completely stops you! Pantoprazole (protonix) 40 mg   Take  30-60 min before first meal of the day and Pepcid (famotidine)  20 mg one after supper   For drainage / throat tickle try take CHLORPHENIRAMINE  4 mg   GERD diet  For cough > tessalon 200 mg every 6 hours as needed  Depomedrol 120 mg today    - Allergy profile 04/03/2021 >  Eos 0.3 /  IgE   32    04/18/2021  f/u ov/[place] office/Cameron Harper re: uacs  Chief Complaint  Patient presents with  . Follow-up    Cough is much better, but still present and worse in late evening and  early am. Also, heavy exertion can trigger cough. Cough is non prod.   Dyspnea:  Better  Cough: no change on Tessalon/ worse p stirs in am  Sleeping: bed blocks/ no noct cough p one chlotrimeton  SABA use: none  02: none  Covid status: vax x 2 and had infection  Lung cancer screening: n/a    No obvious day to day or daytime variability or assoc excess/ purulent sputum or mucus plugs or hemoptysis or cp or chest tightness, subjective wheeze or overt sinus or hb symptoms.   Sleeping  without nocturnal  or early am exacerbation  of respiratory  c/o's or need for noct saba. Also denies any obvious fluctuation of symptoms with weather or environmental changes or other aggravating or alleviating factors except as outlined above   No unusual exposure hx or h/o childhood pna/ asthma or knowledge of premature birth.  Current Allergies, Complete Past Medical History, Past Surgical History, Family History, and Social History were reviewed in [employer] record.  ROS  The following are not active complaints unless bolded Hoarseness, sore throat, dysphagia, dental problems, itching, sneezing,  nasal congestion or discharge of excess mucus or purulent secretions, ear ache,   fever,  chills, sweats, unintended wt loss or wt gain, classically pleuritic or exertional cp,  orthopnea pnd or arm/hand swelling  or leg swelling, presyncope, palpitations, abdominal pain, anorexia, nausea, vomiting, diarrhea  or change in bowel habits or change in bladder habits, change in stools or change in urine, dysuria, hematuria,  rash, arthralgias, visual complaints, headache, numbness, weakness or ataxia or problems with walking or coordination,  change in mood or  memory.        Current Meds  Medication Sig  . chlorpheniramine (CHLOR-TRIMETON) 4 MG tablet Take 4 mg by mouth every 4 (four) hours as needed for allergies.  . famotidine (PEPCID) 20 MG tablet One after supper  . ibuprofen (ADVIL) 200 MG  tablet Take 200 mg by mouth every 6 (six) hours as needed.  . pantoprazole (PROTONIX) 40 MG tablet Take 1 tablet (40 mg total) by mouth daily. Take 30-60 min before first meal of the day               Past Medical History:  Diagnosis Date  . Chronic back pain   . Eczema     Objective:       Wt Readings from Last 3 Encounters:  04/18/21 170 lb (77.1 kg)  04/03/21 171 lb (77.6 kg)  03/07/21 173 lb (78.5 kg)     Vital signs reviewed  04/18/2021  - Note at rest 02 sats  99% on RA   General appearance:    amb wm with prominent "pseudowheeze"     HEENT : pt wearing mask not removed for exam due to covid -19 concerns.    NECK :  without JVD/Nodes/TM/ nl carotid upstrokes bilaterally   LUNGS: no acc muscle use,  Nl contour chest which is clear to A and P bilaterally without cough on insp or exp maneuvers   CV:  RRR  no s3 or murmur or increase in P2, and no edema   ABD:  soft and nontender with nl inspiratory excursion in the supine position. No bruits or organomegaly appreciated, bowel sounds nl  MS:  Nl gait/ ext warm without deformities, calf tenderness, cyanosis  - subtle  clubbing No obvious joint restrictions   SKIN: warm and dry without lesions    NEURO:  alert, approp, nl sensorium with  no motor or cerebellar deficits apparent.     CXR PA and Lateral:   04/18/2021 :    I personally reviewed images and agree with radiology impression as follows:    Mediastinal mass is suspected overlying the left hilar region. Further evaluation with contrast-enhanced chest CT recommended.                Assessment

## 2021-04-18 NOTE — Patient Instructions (Addendum)
The key is to stop smoking completely before smoking completely stops you!  I will be referring you to ENT for sinus and throat evaluaton and record your "wheezing" sound with normal breathing and deep breathing.  Depomderol 120 mg IM today    Please remember to go to the lab and x-ray department at [facility]   for your tests - we will call you with the results when they are available.      Pulmonary follow up is as needed Add: cancel ENT/ order  CT chest  With contrast

## 2021-04-19 ENCOUNTER — Other Ambulatory Visit: Payer: Self-pay | Admitting: Internal Medicine

## 2021-04-19 DIAGNOSIS — R918 Other nonspecific abnormal finding of lung field: Secondary | ICD-10-CM

## 2021-04-20 ENCOUNTER — Telehealth: Payer: Self-pay | Admitting: Internal Medicine

## 2021-04-20 NOTE — Telephone Encounter (Signed)
Pt states that [facility] wanted to schedule a PET scan, but pt's understanding was that he needed a CT scan. Is scheduled on the 31st but wants to know if he should go sooner. Is also wondering about the meds he is taking and if he should keep taking them. Final concern is the mass that was supposedly found. Pt just has a lot of questions, concerns and anxiety about everything going on. Please advise.

## 2021-04-20 NOTE — Telephone Encounter (Signed)
ATC x1, LVM to return call.  Dr. Melvyn Novas, Does the patient need to get a PET scan as well as the CT with contrast?  Please advise.  Thank you.

## 2021-04-20 NOTE — Telephone Encounter (Signed)
I called patient back and he has already spoken with Dr. Melvyn Novas who explained why he wanted a PET scan and not CT w/contrast and answered all the patients questions. Nothing further was needed so will close encounter.

## 2021-04-25 NOTE — Telephone Encounter (Signed)
Advised that insurance won't pay for PET  See if we can set up a peer to peer call asap

## 2021-04-25 NOTE — Telephone Encounter (Signed)
This was brought up to Dr Melvyn Novas because I was trying to precert the Pet scan and there was no ct to go by insurance will not auth the pet without a ct first Magda Paganini was going to talk to dr wert about it to see if he would order the ct first Cameron Harper

## 2021-04-25 NOTE — Telephone Encounter (Signed)
Routing to [employer] about peer-to-peer

## 2021-05-02 ENCOUNTER — Encounter (HOSPITAL_COMMUNITY)
Admission: RE | Admit: 2021-05-02 | Discharge: 2021-05-02 | Disposition: A | Discharge status: Home / Self Care | Payer: Managed Care, Other (non HMO) | Source: Ambulatory Visit | Attending: Internal Medicine | Admitting: Internal Medicine

## 2021-05-02 ENCOUNTER — Other Ambulatory Visit: Payer: Self-pay

## 2021-05-02 DIAGNOSIS — R918 Other nonspecific abnormal finding of lung field: Secondary | ICD-10-CM | POA: Diagnosis not present

## 2021-05-02 LAB — GLUCOSE, CAPILLARY: Glucose-Capillary: 98 mg/dL (ref 70–99)

## 2021-05-02 MED ORDER — FLUDEOXYGLUCOSE F - 18 (FDG) INJECTION
8.4600 | Freq: Once | INTRAVENOUS | Status: AC | PRN
  Administered 2021-05-02: 8.46 via INTRAVENOUS

## 2021-05-03 ENCOUNTER — Telehealth: Payer: Self-pay | Admitting: Internal Medicine

## 2021-05-03 NOTE — Telephone Encounter (Signed)
Dr Melvyn Novas can you please advise on PET results?

## 2021-05-04 ENCOUNTER — Telehealth: Payer: Self-pay | Admitting: Pulmonary Disease

## 2021-05-04 DIAGNOSIS — R918 Other nonspecific abnormal finding of lung field: Secondary | ICD-10-CM

## 2021-05-04 DIAGNOSIS — R599 Enlarged lymph nodes, unspecified: Secondary | ICD-10-CM | POA: Diagnosis present

## 2021-05-04 NOTE — Telephone Encounter (Signed)
Referred to Dr Windell Norfolk

## 2021-05-04 NOTE — Telephone Encounter (Signed)
Pt informed of the following:  Covid test 6/7 @ 12:00pm  EBUS 6/10 @ 7:30am; 5:30am arrival time at [facility] ENDO

## 2021-05-04 NOTE — Telephone Encounter (Addendum)
PCCM:   Received urgent referral for bronchoscopy from Dr. Lottie Dawson.  Patient imaging reviewed.  I called and spoke with the patient via phone.  He is available to do this case next Friday morning.  Orders have been placed for video bronchoscopy with endobronchial ultrasound.  Garner Nash, DO [facility] 05/04/2021 12:10 PM

## 2021-05-05 ENCOUNTER — Telehealth: Payer: Self-pay | Admitting: Internal Medicine

## 2021-05-05 NOTE — Telephone Encounter (Signed)
Called and spoke with patient who stated Pepcid is too expensive at pharmacy. Would like something else called into pharmacy. Pharmacy is [facility].   Dr Melvyn Novas please advise.

## 2021-05-05 NOTE — Telephone Encounter (Signed)
Called and went over Dr Gustavus Bryant response/recommendations for OTC pepcid 20mg . All questions answered and patient expressed full understanding. Nothing further needed at this time.

## 2021-05-05 NOTE — Telephone Encounter (Signed)
Would do otc pepcid 20 mg if insurance not covering as this is as cheap as it gets.

## 2021-05-09 ENCOUNTER — Other Ambulatory Visit (HOSPITAL_COMMUNITY)
Admission: RE | Admit: 2021-05-09 | Discharge: 2021-05-09 | Disposition: A | Discharge status: Home / Self Care | Payer: Managed Care, Other (non HMO) | Source: Ambulatory Visit | Attending: Pulmonary Disease | Admitting: Pulmonary Disease

## 2021-05-09 DIAGNOSIS — Z01812 Encounter for preprocedural laboratory examination: Secondary | ICD-10-CM | POA: Diagnosis not present

## 2021-05-09 DIAGNOSIS — Z20822 Contact with and (suspected) exposure to covid-19: Secondary | ICD-10-CM | POA: Insufficient documentation

## 2021-05-09 LAB — SARS CORONAVIRUS 2 (TAT 6-24 HRS): SARS Coronavirus 2: NEGATIVE

## 2021-05-11 ENCOUNTER — Encounter (HOSPITAL_COMMUNITY): Payer: Self-pay | Admitting: Pulmonary Disease

## 2021-05-11 ENCOUNTER — Telehealth: Payer: Self-pay | Admitting: Pulmonary Disease

## 2021-05-11 ENCOUNTER — Other Ambulatory Visit: Payer: Self-pay

## 2021-05-11 NOTE — Telephone Encounter (Signed)
Called and spoke with patient, he had questions regarding medications he can take prior to his procedure in the morning.  He states he received a call from endo regarding his procedure (do's and dont's and when to arrive) and a call from [facility] regarding his medications, but he still had questions.  He says no one told him whether he could leave his nicotine patch on as he is still on the step down process since he stopped smoking.  He also take Pepcid at HS and Protonix in the morning and wants to know if he can take those as he usually does.  He takes Advil dual action (has advil and tylenol in it) for his back).  He told the peple that called him about his mediations everything he takes but was not advised about what to do as far as taking them the day of his procedure.  He last took the Advil dual action today 05/11/21 at 10:08 am.  He also wants to know if he can take something to help him sleep tonight as he is very nervous.  Dr. Valeta Harms, please advise.  Thank you.

## 2021-05-11 NOTE — Progress Notes (Signed)
DUE TO COVID-19 ONLY ONE VISITOR IS ALLOWED TO COME WITH YOU AND STAY IN THE WAITING ROOM ONLY DURING PRE OP AND PROCEDURE DAY OF SURGERY.   Patient denies shortness of breath, fever, cough or chest pain.  PCP - Dr [name] Pulmonology - Dr Christinia Gully  Chest x-ray - DOS 05/12/21; 04/18/21 (2V) EKG - 03/07/21 Stress Test - 03/15/21 ECHO - 03/13/21 Cardiac Cath - n/a  STOP now taking any Aspirin (unless otherwise instructed by your surgeon), Aleve, Naproxen, Ibuprofen, Motrin, Advil, Goody's, BC's, all herbal medications, fish oil, and all vitamins.   Coronavirus Screening Covid test ion 05/09/21 was negative.  Patient verbalized understanding of instructions that were given via phone.

## 2021-05-11 NOTE — Telephone Encounter (Signed)
Called and spoke with patient, provided recommendations per Dr. Valeta Harms.  He states he did receive a call from pre admission and they went over some information and said not to take the Advil, but Tylenol was ok.  He verbalized understanding.  Nothing further needed.

## 2021-05-11 NOTE — Anesthesia Preprocedure Evaluation (Addendum)
Anesthesia Evaluation  Patient identified by MRN, date of birth, ID band Patient awake    Reviewed: Allergy & Precautions, NPO status , Patient's Chart, lab work & pertinent test results  History of Anesthesia Complications Negative for: history of anesthetic complications  Airway Mallampati: II  TM Distance: >3 FB Neck ROM: Full    Dental  (+) Teeth Intact, Dental Advisory Given   Pulmonary neg pulmonary ROS, Patient abstained from smoking., former smoker,  Lung mass   Pulmonary exam normal        Cardiovascular negative cardio ROS Normal cardiovascular exam     Neuro/Psych negative neurological ROS     GI/Hepatic Neg liver ROS, GERD  ,  Endo/Other  negative endocrine ROS  Renal/GU negative Renal ROS  negative genitourinary   Musculoskeletal negative musculoskeletal ROS (+)   Abdominal   Peds  Hematology negative hematology ROS (+)   Anesthesia Other Findings  ETT 03/15/21: Normal ETT, No significant arrhythmias, Normal hemodynamic response   Echo 03/13/21: EF 27-03%, normal diastolic function, normal RV function, normal valves  Reproductive/Obstetrics                           Anesthesia Physical Anesthesia Plan  ASA: 2  Anesthesia Plan: General   Post-op Pain Management:    Induction: Intravenous  PONV Risk Score and Plan: 2 and Ondansetron, Dexamethasone, Treatment may vary due to age or medical condition and Midazolam  Airway Management Planned: Oral ETT  Additional Equipment: None  Intra-op Plan:   Post-operative Plan: Extubation in OR  Informed Consent: I have reviewed the patients History and Physical, chart, labs and discussed the procedure including the risks, benefits and alternatives for the proposed anesthesia with the patient or authorized representative who has indicated his/her understanding and acceptance.     Dental advisory given  Plan Discussed with:    Anesthesia Plan Comments:        Anesthesia Quick Evaluation

## 2021-05-12 ENCOUNTER — Ambulatory Visit (HOSPITAL_COMMUNITY): Payer: Managed Care, Other (non HMO)

## 2021-05-12 ENCOUNTER — Ambulatory Visit (HOSPITAL_COMMUNITY)
Admission: RE | Admit: 2021-05-12 | Discharge: 2021-05-12 | Disposition: A | Discharge status: Home / Self Care | Payer: Managed Care, Other (non HMO) | Attending: Pulmonary Disease | Admitting: Pulmonary Disease

## 2021-05-12 ENCOUNTER — Telehealth: Payer: Self-pay | Admitting: Internal Medicine

## 2021-05-12 ENCOUNTER — Ambulatory Visit (HOSPITAL_COMMUNITY): Payer: Managed Care, Other (non HMO) | Admitting: Anesthesiology

## 2021-05-12 ENCOUNTER — Encounter (HOSPITAL_COMMUNITY)
Admission: RE | Disposition: A | Discharge status: Home / Self Care | Payer: Self-pay | Source: Home / Self Care | Attending: Pulmonary Disease

## 2021-05-12 ENCOUNTER — Encounter (HOSPITAL_COMMUNITY): Payer: Self-pay | Admitting: Pulmonary Disease

## 2021-05-12 ENCOUNTER — Other Ambulatory Visit: Payer: Self-pay

## 2021-05-12 DIAGNOSIS — C3492 Malignant neoplasm of unspecified part of left bronchus or lung: Secondary | ICD-10-CM

## 2021-05-12 DIAGNOSIS — R918 Other nonspecific abnormal finding of lung field: Secondary | ICD-10-CM | POA: Diagnosis present

## 2021-05-12 DIAGNOSIS — Z79899 Other long term (current) drug therapy: Secondary | ICD-10-CM | POA: Diagnosis not present

## 2021-05-12 DIAGNOSIS — R599 Enlarged lymph nodes, unspecified: Secondary | ICD-10-CM | POA: Diagnosis not present

## 2021-05-12 DIAGNOSIS — C3432 Malignant neoplasm of lower lobe, left bronchus or lung: Secondary | ICD-10-CM

## 2021-05-12 DIAGNOSIS — I509 Heart failure, unspecified: Secondary | ICD-10-CM

## 2021-05-12 DIAGNOSIS — C3402 Malignant neoplasm of left main bronchus: Secondary | ICD-10-CM | POA: Insufficient documentation

## 2021-05-12 DIAGNOSIS — C771 Secondary and unspecified malignant neoplasm of intrathoracic lymph nodes: Secondary | ICD-10-CM | POA: Diagnosis not present

## 2021-05-12 DIAGNOSIS — Z87891 Personal history of nicotine dependence: Secondary | ICD-10-CM | POA: Diagnosis not present

## 2021-05-12 HISTORY — PX: HEMOSTASIS CONTROL: SHX6838

## 2021-05-12 HISTORY — PX: BRONCHIAL WASHINGS: SHX5105

## 2021-05-12 HISTORY — PX: VIDEO BRONCHOSCOPY WITH ENDOBRONCHIAL ULTRASOUND: SHX6177

## 2021-05-12 HISTORY — PX: CRYOTHERAPY: SHX6894

## 2021-05-12 HISTORY — PX: BRONCHIAL NEEDLE ASPIRATION BIOPSY: SHX5106

## 2021-05-12 HISTORY — PX: BRONCHIAL BRUSHINGS: SHX5108

## 2021-05-12 HISTORY — DX: Gastro-esophageal reflux disease without esophagitis: K21.9

## 2021-05-12 HISTORY — PX: BRONCHIAL DILITATION: SHX5107

## 2021-05-12 SURGERY — BRONCHOSCOPY, WITH EBUS
Anesthesia: General

## 2021-05-12 MED ORDER — LACTATED RINGERS IV SOLN: INTRAVENOUS | Status: DC

## 2021-05-12 MED ORDER — DEXAMETHASONE SODIUM PHOSPHATE 10 MG/ML IJ SOLN
INTRAMUSCULAR | Status: DC | PRN
  Administered 2021-05-12: 10 mg via INTRAVENOUS

## 2021-05-12 MED ORDER — OXYCODONE HCL 5 MG PO TABS
5.0000 mg | ORAL_TABLET | Freq: Once | ORAL | Status: AC | PRN
  Administered 2021-05-12: 5 mg via ORAL

## 2021-05-12 MED ORDER — FENTANYL CITRATE (PF) 250 MCG/5ML IJ SOLN
INTRAMUSCULAR | Status: DC | PRN
  Administered 2021-05-12: 100 ug via INTRAVENOUS

## 2021-05-12 MED ORDER — SUGAMMADEX SODIUM 200 MG/2ML IV SOLN
INTRAVENOUS | Status: DC | PRN
  Administered 2021-05-12: 200 mg via INTRAVENOUS

## 2021-05-12 MED ORDER — CHLORHEXIDINE GLUCONATE 0.12 % MT SOLN
OROMUCOSAL | Status: AC
  Administered 2021-05-12: 15 mL
  Filled 2021-05-12: qty 15

## 2021-05-12 MED ORDER — ROCURONIUM BROMIDE 10 MG/ML (PF) SYRINGE
PREFILLED_SYRINGE | INTRAVENOUS | Status: DC | PRN
  Administered 2021-05-12: 70 mg via INTRAVENOUS

## 2021-05-12 MED ORDER — OXYCODONE HCL 5 MG PO TABS
ORAL_TABLET | ORAL | Status: AC
  Filled 2021-05-12: qty 1

## 2021-05-12 MED ORDER — SODIUM CHLORIDE (PF) 0.9 % IJ SOLN
PREFILLED_SYRINGE | INTRAMUSCULAR | Status: DC | PRN
  Administered 2021-05-12: 4 mL

## 2021-05-12 MED ORDER — ONDANSETRON HCL 4 MG/2ML IJ SOLN
INTRAMUSCULAR | Status: DC | PRN
  Administered 2021-05-12: 4 mg via INTRAVENOUS

## 2021-05-12 MED ORDER — LIDOCAINE 2% (20 MG/ML) 5 ML SYRINGE
INTRAMUSCULAR | Status: DC | PRN
  Administered 2021-05-12: 100 mg via INTRAVENOUS

## 2021-05-12 MED ORDER — OXYCODONE HCL 5 MG/5ML PO SOLN: 5.0000 mg | Freq: Once | ORAL | Status: AC | PRN

## 2021-05-12 MED ORDER — ONDANSETRON HCL 4 MG/2ML IJ SOLN: 4.0000 mg | Freq: Once | INTRAMUSCULAR | Status: DC | PRN

## 2021-05-12 MED ORDER — PROPOFOL 10 MG/ML IV BOLUS
INTRAVENOUS | Status: DC | PRN
  Administered 2021-05-12: 200 mg via INTRAVENOUS

## 2021-05-12 MED ORDER — FENTANYL CITRATE (PF) 100 MCG/2ML IJ SOLN: 25.0000 ug | INTRAMUSCULAR | Status: DC | PRN

## 2021-05-12 MED ORDER — EPINEPHRINE 1 MG/10ML IJ SOSY
PREFILLED_SYRINGE | INTRAMUSCULAR | Status: AC
  Filled 2021-05-12: qty 10

## 2021-05-12 MED ORDER — AMISULPRIDE (ANTIEMETIC) 5 MG/2ML IV SOLN
10.0000 mg | Freq: Once | INTRAVENOUS | Status: DC | PRN
  Filled 2021-05-12: qty 4

## 2021-05-12 MED ORDER — MIDAZOLAM HCL 5 MG/5ML IJ SOLN
INTRAMUSCULAR | Status: DC | PRN
  Administered 2021-05-12: 2 mg via INTRAVENOUS

## 2021-05-12 SURGICAL SUPPLY — 30 items
BRUSH CYTOL CELLEBRITY 1.5X140 (MISCELLANEOUS) IMPLANT
CANISTER SUCT 3000ML PPV (MISCELLANEOUS) ×4 IMPLANT
CONT SPEC 4OZ CLIKSEAL STRL BL (MISCELLANEOUS) ×4 IMPLANT
COVER BACK TABLE 60X90IN (DRAPES) ×4 IMPLANT
COVER DOME SNAP 22 D (MISCELLANEOUS) ×4 IMPLANT
FORCEPS BIOP RJ4 1.8 (CUTTING FORCEPS) IMPLANT
GAUZE SPONGE 4X4 12PLY STRL (GAUZE/BANDAGES/DRESSINGS) ×4 IMPLANT
GLOVE BIO SURGEON STRL SZ7.5 (GLOVE) ×4 IMPLANT
GOWN STRL REUS W/ TWL LRG LVL3 (GOWN DISPOSABLE) ×2 IMPLANT
GOWN STRL REUS W/TWL LRG LVL3 (GOWN DISPOSABLE) ×4
KIT CLEAN ENDO COMPLIANCE (KITS) ×8 IMPLANT
KIT TURNOVER KIT B (KITS) ×4 IMPLANT
MARKER SKIN DUAL TIP RULER LAB (MISCELLANEOUS) ×4 IMPLANT
NEEDLE EBUS SONO TIP PENTAX (NEEDLE) ×4 IMPLANT
NS IRRIG 1000ML POUR BTL (IV SOLUTION) ×4 IMPLANT
OIL SILICONE PENTAX (PARTS (SERVICE/REPAIRS)) ×4 IMPLANT
PAD ARMBOARD 7.5X6 YLW CONV (MISCELLANEOUS) ×8 IMPLANT
SOL ANTI FOG 6CC (MISCELLANEOUS) ×2 IMPLANT
SOLUTION ANTI FOG 6CC (MISCELLANEOUS) ×2
SYR 20CC LL (SYRINGE) ×8 IMPLANT
SYR 20ML ECCENTRIC (SYRINGE) ×8 IMPLANT
SYR 50ML SLIP (SYRINGE) IMPLANT
SYR 5ML LUER SLIP (SYRINGE) ×4 IMPLANT
TOWEL OR 17X24 6PK STRL BLUE (TOWEL DISPOSABLE) ×4 IMPLANT
TRAP SPECIMEN MUCOUS 40CC (MISCELLANEOUS) IMPLANT
TUBE CONNECTING 20'X1/4 (TUBING) ×2
TUBE CONNECTING 20X1/4 (TUBING) ×6 IMPLANT
UNDERPAD 30X30 (UNDERPADS AND DIAPERS) ×4 IMPLANT
VALVE DISPOSABLE (MISCELLANEOUS) ×4 IMPLANT
WATER STERILE IRR 1000ML POUR (IV SOLUTION) ×4 IMPLANT

## 2021-05-12 NOTE — Anesthesia Procedure Notes (Signed)
Procedure Name: Intubation Date/Time: 05/12/2021 7:32 AM Performed by: Myna Bright, CRNA Pre-anesthesia Checklist: Patient identified, Emergency Drugs available, Suction available and Patient being monitored Patient Re-evaluated:Patient Re-evaluated prior to induction Oxygen Delivery Method: Circle system utilized Preoxygenation: Pre-oxygenation with 100% oxygen Induction Type: IV induction Ventilation: Mask ventilation without difficulty Laryngoscope Size: Mac and 4 Grade View: Grade I Tube type: Oral Tube size: 8.5 mm Number of attempts: 1 Airway Equipment and Method: Stylet Placement Confirmation: ETT inserted through vocal cords under direct vision, positive ETCO2 and breath sounds checked- equal and bilateral Secured at: 22 cm Tube secured with: Tape Dental Injury: Teeth and Oropharynx as per pre-operative assessment

## 2021-05-12 NOTE — Discharge Instructions (Signed)
Flexible Bronchoscopy, Care After This sheet gives you information about how to care for yourself after your test. Your doctor may also give you more specific instructions. If you have problems or questions, contact your doctor. Follow these instructions at home: Eating and drinking Do not eat or drink anything (not even water) for 2 hours after your test, or until your numbing medicine (local anesthetic) wears off. When your numbness is gone and your cough and gag reflexes have come back, you may: Eat only soft foods. Slowly drink liquids. The day after the test, go back to your normal diet. Driving Do not drive for 24 hours if you were given a medicine to help you relax (sedative). Do not drive or use heavy machinery while taking prescription pain medicine. General instructions  Take over-the-counter and prescription medicines only as told by your doctor. Return to your normal activities as told. Ask what activities are safe for you. Do not use any products that have nicotine or tobacco in them. This includes cigarettes and e-cigarettes. If you need help quitting, ask your doctor. Keep all follow-up visits as told by your doctor. This is important. It is very important if you had a tissue sample (biopsy) taken. Get help right away if: You have shortness of breath that gets worse. You get light-headed. You feel like you are going to pass out (faint). You have chest pain. You cough up: More than a little blood. More blood than before. Summary Do not eat or drink anything (not even water) for 2 hours after your test, or until your numbing medicine wears off. Do not use cigarettes. Do not use e-cigarettes. Get help right away if you have chest pain.  This information is not intended to replace advice given to you by your health care provider. Make sure you discuss any questions you have with your health care provider. Document Released: 09/16/2009 Document Revised: 11/01/2017 Document  Reviewed: 12/07/2016 Elsevier Patient Education  2020 [employer].

## 2021-05-12 NOTE — Interval H&P Note (Signed)
History and Physical Interval Note:  05/12/2021 7:07 AM  Cameron Harper  has presented today for surgery, with the diagnosis of Mediastinal adenopathy.  The various methods of treatment have been discussed with the patient and family. After consideration of risks, benefits and other options for treatment, the patient has consented to  Procedure(s): Ensign (N/A) as a surgical intervention.  The patient's history has been reviewed, patient examined, no change in status, stable for surgery.  I have reviewed the patient's chart and labs.  Questions were answered to the patient's satisfaction.     Lee

## 2021-05-12 NOTE — Transfer of Care (Signed)
Immediate Anesthesia Transfer of Care Note  Patient: LUVERN MCISAAC  Procedure(s) Performed: VIDEO BRONCHOSCOPY WITH ENDOBRONCHIAL ULTRASOUND BRONCHIAL BRUSHINGS HEMOSTASIS CONTROL BRONCHIAL DILITATION CRYOTHERAPY BRONCHIAL WASHINGS BRONCHIAL NEEDLE ASPIRATION BIOPSIES  Patient Location: PACU  Anesthesia Type:General  Level of Consciousness: awake, alert  and oriented  Airway & Oxygen Therapy: Patient Spontanous Breathing  Post-op Assessment: Report given to RN and Post -op Vital signs reviewed and stable  Post vital signs: Reviewed and stable  Last Vitals:  Vitals Value Taken Time  BP 119/67 05/12/21 0838  Temp    Pulse 86 05/12/21 0839  Resp 16 05/12/21 0839  SpO2 98 % 05/12/21 0839  Vitals shown include unvalidated device data.  Last Pain:  Vitals:   05/12/21 0627  PainSc: 2       Patients Stated Pain Goal: 2 (83/41/96 2229)  Complications: No notable events documented.

## 2021-05-12 NOTE — Anesthesia Postprocedure Evaluation (Signed)
Anesthesia Post Note  Patient: Cameron Harper  Procedure(s) Performed: VIDEO BRONCHOSCOPY WITH ENDOBRONCHIAL ULTRASOUND BRONCHIAL BRUSHINGS HEMOSTASIS CONTROL BRONCHIAL DILITATION CRYOTHERAPY BRONCHIAL WASHINGS BRONCHIAL NEEDLE ASPIRATION BIOPSIES     Patient location during evaluation: PACU Anesthesia Type: General Level of consciousness: awake and alert Pain management: pain level controlled Vital Signs Assessment: post-procedure vital signs reviewed and stable Respiratory status: spontaneous breathing, nonlabored ventilation and respiratory function stable Cardiovascular status: blood pressure returned to baseline and stable Postop Assessment: no apparent nausea or vomiting Anesthetic complications: no   No notable events documented.  Last Vitals:  Vitals:   05/12/21 0852 05/12/21 0907  BP: 115/69 121/73  Pulse: 89 79  Resp: (!) 23 15  Temp:  36.7 C  SpO2: 99% 98%    Last Pain:  Vitals:   05/12/21 0900  PainSc: [place]

## 2021-05-12 NOTE — Telephone Encounter (Signed)
Scheduled appt per 6/9 referral. Called pt, no answer. Left msg for pt with appt date and time.

## 2021-05-12 NOTE — Op Note (Signed)
Video Bronchoscopy with Endobronchial Ultrasound Procedure Note Video Bronchoscopy with endobronchial cryotherapy, cryo biopsies, tumor debulking, relief of obstruction and left mainstem airway balloon dilatation   Date of Operation: 05/12/2021  Pre-op Diagnosis: Left lung mass  Post-op Diagnosis: Left lung mass  Surgeon: Garner Nash, DO   Assistants: None   Anesthesia: General endotracheal anesthesia  Operation: Flexible video fiberoptic bronchoscopy with endobronchial ultrasound and biopsies.  Estimated Blood Loss: Minimal  Complications: None   Indications and History: Cameron Harper is a 45 y.o. male with left lung mass.  The risks, benefits, complications, treatment options and expected outcomes were discussed with the patient.  The possibilities of pneumothorax, pneumonia, reaction to medication, pulmonary aspiration, perforation of a viscus, bleeding, failure to diagnose a condition and creating a complication requiring transfusion or operation were discussed with the patient who freely signed the consent.    Description of Procedure: The patient was examined in the preoperative area and history and data from the preprocedure consultation were reviewed. It was deemed appropriate to proceed.  The patient was taken to [facility] endoscopy room 2, identified as Cameron Harper and the procedure verified as Flexible Video Fiberoptic Bronchoscopy.  A Time Out was held and the above information confirmed. After being taken to the operating room general anesthesia was initiated and the patient  was orally intubated. The video fiberoptic bronchoscope was introduced via the endotracheal tube and a general inspection was performed which showed normal right lung anatomy, distal left mainstem approximately 75% occlusion of the opening of the left lower lobe from submucosal tumor infiltration and extrinsic compression.  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 The standard scope was then withdrawn and the endobronchial ultrasound was used to identify and characterize the peritracheal, hilar and bronchial lymph nodes. Inspection showed enlarged paratracheal adenopathy, 4L. Using real-time ultrasound guidance Wang needle biopsies were take from Station 4L nodes and were sent for cytology.   Following the endobronchial ultrasound portion the standard therapeutic bronchoscope was inserted to the airway.  4 cc of 1: 10,000 dilution of epinephrine was used to coat the left mainstem endobronchial tumor bed.  Using a cytology brush we completed specimens from the left mainstem brushings, the brush was clipped and added to cellblock analysis. Using a 1.7 mm erbe therapy probe endobronchial cryo biopsies were taken from visible tumor for relief of obstruction, lesional excision and tumor debulking.  Following opening of the hall we utilized freeze thaw cycles of approximately 30 seconds along the anterior lateral wall of the left mainstem.  At this point most of the left lower lobe was now open and at least visible.  We were able to obtain visualization of the left lower lobe superior segment.  However the opening was still too small to pass the bronchoscope distally into the left lower lobe.  We then utilized a 07-11-09 millimeter [employer] pulmonary a balloon.  We inserted the balloon into the left lower lobe positioned with the mid section of the balloon over the distal portion of the left mainstem extending into the lower lobe.  We completed three 1 minute dilatations of the left lower lobe holding the balloon in place for 1-2 minutes at a time.  After each balloon dilatation the bronchoscope was used for airway inspection to ensure no mucosal tearing.  The left lower lobe  was well visualized now.  We were able to pass the  bronchoscope into the left lower lobe.  And there is no distal tumor involvement.  Again cryotherapy was used along the distal left mainstem opening in freeze thaw cycles to help control recurrence of endobronchial disease.  Following the therapeutic portions of the bronchoscopy we then completed a BAL to the left upper lobe for cytology.  At the termination of the procedure there was no significant bleeding and no airway lacerations from dilatation.  The standard therapeutic bronchoscope was used for aspiration of the bilateral mainstem's to remove any remaining blood clots and debris.  The standard scope was brought to just above the main carina for observation.  The patient tolerated the procedure well without apparent complications. There was no significant blood loss. The bronchoscope was withdrawn. Anesthesia was reversed and the patient was taken to the PACU for recovery.   Samples: 1.  Wang needle biopsies from 4L node 2.  Left mainstem brushing 3.  Endobronchial biopsies, cryo biopsies left mainstem 4.  BAL left upper lobe cytology  Plans:  The patient will be discharged from the PACU to home when recovered from anesthesia. We will review the cytology, pathology results with the patient when they become available. Outpatient followup will be with Garner Nash, DO.   Garner Nash, DO [facility] 05/12/2021 8:41 AM

## 2021-05-16 ENCOUNTER — Ambulatory Visit
Admission: RE | Admit: 2021-05-16 | Discharge: 2021-05-16 | Disposition: A | Discharge status: Home / Self Care | Payer: Managed Care, Other (non HMO) | Source: Ambulatory Visit | Attending: Radiation Oncology | Admitting: Radiation Oncology

## 2021-05-16 ENCOUNTER — Encounter: Payer: Self-pay | Admitting: Radiation Oncology

## 2021-05-16 ENCOUNTER — Institutional Professional Consult (permissible substitution): Payer: Managed Care, Other (non HMO) | Admitting: Radiation Oncology

## 2021-05-16 ENCOUNTER — Other Ambulatory Visit: Payer: Self-pay

## 2021-05-16 ENCOUNTER — Ambulatory Visit: Payer: Managed Care, Other (non HMO)

## 2021-05-16 VITALS — Ht 69.0 in | Wt 170.0 lb

## 2021-05-16 DIAGNOSIS — C3432 Malignant neoplasm of lower lobe, left bronchus or lung: Secondary | ICD-10-CM

## 2021-05-16 HISTORY — DX: Malignant neoplasm of unspecified part of unspecified bronchus or lung: C34.90

## 2021-05-16 LAB — SURGICAL PATHOLOGY

## 2021-05-16 LAB — CYTOLOGY - NON PAP

## 2021-05-16 NOTE — Progress Notes (Signed)
Thoracic Location of Tumor / Histology: Mediastinal Mass  He states he has had problems since having covid last year.  He reports the coughing was getting worse.   PET 6/81/1572: Hypermetabolic mass or lymphadenopathy involving the left hilum and central left lower lobe. Hypermetabolic lymphadenopathy also seen within the mediastinum, bilateral parotid glands, and abdominal retroperitoneum. Differential diagnosis includes lung carcinoma with metastatic disease and lymphoma.  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 Julien Nordmann 05/18/2021   Signs/Symptoms Weight changes, if any: He has made some dietary changes here recently and has lost a little weight. Respiratory complaints, if any: He notes more wheezing since his biopsy, SOB has improved. Hemoptysis, if any: He reports his cough has gotten better, non-productive, no hemoptysis noted. Pain issues, if any: None  SAFETY ISSUES: Prior radiation? No Pacemaker/ICD? No Possible current pregnancy? N/a Is the patient on methotrexate? No  Current Complaints / other details:

## 2021-05-16 NOTE — Progress Notes (Signed)
Radiation Oncology         [phone] ________________________________  Initial Outpatient Consultation - Conducted via telephone due to current COVID-19 concerns for limiting patient exposure  I spoke with the patient to conduct this consult visit via telephone to spare the patient unnecessary potential exposure in the healthcare setting during the current COVID-19 pandemic. The patient was notified in advance and was offered a [place] meeting to allow for face to face communication but unfortunately reported that they did not have the appropriate resources/technology to support such a visit and instead preferred to proceed with a telephone consult.   Name: Cameron Harper        MRN: [id number]  Date of Service: 05/16/2021 DOB: 12-Mar-1976  QQ:[id number], Cameron Reichmann, MD  Garner Nash, DO     REFERRING PHYSICIAN: Garner Nash, DO   DIAGNOSIS: The encounter diagnosis was Malignant neoplasm of lower lobe of left lung ([place]).   HISTORY OF PRESENT ILLNESS: Cameron Harper is a 45 y.o. male seen at the request of Dr. Valeta Harms for a newly diagnosed lung cancer. The patient had Covid 19 in September 2021. He continued to have an intermittent cough. He underwent a CXR on 01/26/21 that was negative for acute findings. His symptoms continued and a CXR on 04/18/21 showed a mass in the mediastinum. A PET scan on 05/02/21 showed a blood pool of 2.2, and several subcentimeter parotid nodes bilaterally which show hypermetabolic activity on the left with an SUV of 6.5, versus right with an SUV of 4.1. The mass in the chest measured 4.9 x 3.7 cm and had an SUV of 11.7, and mediastinal adenopathy measuring 2.3 x 2.1 cm with an SUV of 11.3, and lower paraesophageal adenopathy with an SUV of 5.9. He also has hypermetabolic adenopathy in the aortocaval space measuring 2.3 cm with an SUV of 8.6. He underwent a bronchoscopy on 05/12/21 that revealed NSCLC, poorly differentiated adenocarcinoma in the 4L node cytology and  cytology as well as pathology of the left mainstem bronchus. He's contacted today to discuss palliative radiotherapy to the chest given the central location of disease and risks of lung collapse.     PREVIOUS RADIATION THERAPY: No   PAST MEDICAL HISTORY:  Past Medical History:  Diagnosis Date   Chronic back pain    Eczema    GERD (gastroesophageal reflux disease)    Lung cancer ([place])        PAST SURGICAL HISTORY: Past Surgical History:  Procedure Laterality Date   BRONCHIAL BRUSHINGS  05/12/2021   Procedure: BRONCHIAL BRUSHINGS;  Surgeon: Garner Nash, DO;  Location: [facility];  Service: Pulmonary;;   BRONCHIAL DILITATION  05/12/2021   Procedure: BRONCHIAL DILITATION;  Surgeon: Garner Nash, DO;  Location: [facility];  Service: Pulmonary;;   BRONCHIAL NEEDLE ASPIRATION BIOPSY  05/12/2021   Procedure: BRONCHIAL NEEDLE ASPIRATION BIOPSIES;  Surgeon: Garner Nash, DO;  Location: [facility];  Service: Pulmonary;;   BRONCHIAL WASHINGS  05/12/2021   Procedure: BRONCHIAL WASHINGS;  Surgeon: Garner Nash, DO;  Location: [facility];  Service: Pulmonary;;   CRYOTHERAPY  05/12/2021   Procedure: CRYOTHERAPY;  Surgeon: Garner Nash, DO;  Location: [facility];  Service: Pulmonary;;   HEMOSTASIS CONTROL  05/12/2021   Procedure: HEMOSTASIS CONTROL;  Surgeon: Garner Nash, DO;  Location: [place];  Service: Pulmonary;;  epi injection   Spine injection     Pain control   VIDEO BRONCHOSCOPY WITH ENDOBRONCHIAL ULTRASOUND N/A 05/12/2021   Procedure: VIDEO  BRONCHOSCOPY WITH ENDOBRONCHIAL ULTRASOUND;  Surgeon: Garner Nash, DO;  Location: [facility];  Service: Pulmonary;  Laterality: N/A;   WISDOM TOOTH EXTRACTION     no anesthesia involed     FAMILY HISTORY:  Family History  Problem Relation Age of Onset   Heart attack Maternal Grandmother    CAD Maternal Grandmother    Diabetes Maternal Grandfather      SOCIAL HISTORY:  reports that he quit smoking  about 4 weeks ago. His smoking use included cigarettes. He has a 25.00 pack-year smoking history. He has never used smokeless tobacco. He reports that he does not drink alcohol and does not use drugs. The patient is married and lives in [place]. He works for [employer] as a Psychologist, clinical. He has two children, one is a teenager and the other in their early 60s. His wife works as a Orthoptist for [employer].   ALLERGIES: Penicillins   MEDICATIONS:  Current Outpatient Medications  Medication Sig Dispense Refill   acetaminophen (TYLENOL) 500 MG tablet Take 500-1,000 mg by mouth every 6 (six) hours as needed (for back pain.).     famotidine (PEPCID) 20 MG tablet One after supper 30 tablet 11   ibuprofen (ADVIL) 200 MG tablet Take 400-600 mg by mouth every 8 (eight) hours as needed (for pain.).     nicotine (NICODERM CQ - DOSED IN MG/24 HOURS) 21 mg/24hr patch Place 21 mg onto the skin daily.     pantoprazole (PROTONIX) 40 MG tablet Take 1 tablet (40 mg total) by mouth daily. Take 30-60 min before first meal of the day 30 tablet 2   No current facility-administered medications for this encounter.     REVIEW OF SYSTEMS: On review of systems, the patient reports that he is doing well overall. He denies any chest pain, shortness of breath. His cough  has improved and he denies hemoptysis since his biopsy or prior to. He denies fevers, chills, night sweats, unintended weight changes. He has been making dietary changes and has lost weigh over the past year being more active. He also reports that he has been anxious about this process and may have lost a few pounds for this reason. He has chronic low back pain, but denies new pains. He has occasional headaches but no progressive symptoms or focal neurologic symptoms. No other complaints are verbalized.      PHYSICAL EXAM:  Wt Readings from Last 3 Encounters:  05/16/21 170 lb (77.1 kg)  05/11/21 170 lb (77.1 kg)  04/18/21 170 lb (77.1 kg)     Pain Assessment Pain Score: 3  Pain Loc: Back/10  Unable to assess due to encounter type.   ECOG = 1  0 - Asymptomatic (Fully active, able to carry on all predisease activities without restriction)  1 - Symptomatic but completely ambulatory (Restricted in physically strenuous activity but ambulatory and able to carry out work of a light or sedentary nature. For example, light housework, office work)  2 - Symptomatic, <50% in bed during the day (Ambulatory and capable of all self care but unable to carry out any work activities. Up and about more than 50% of waking hours)  3 - Symptomatic, >50% in bed, but not bedbound (Capable of only limited self-care, confined to bed or chair 50% or more of waking hours)  4 - Bedbound (Completely disabled. Cannot carry on any self-care. Totally confined to bed or chair)  5 - Death   Eustace Pen MM, Creech RH, Peggs,  et al. [phone]). "Toxicity and response criteria of the [facility]". [place] Oncol. 5 (6): 649-55    LABORATORY DATA:  Lab Results  Component Value Date   WBC 8.4 04/03/2021   HGB 14.2 04/03/2021   HCT 44.5 04/03/2021   MCV 92.9 04/03/2021   PLT 266 04/03/2021   No results found for: NA, K, CL, CO2 No results found for: ALT, AST, GGT, ALKPHOS, BILITOT    RADIOGRAPHY: DG Chest 2 View  Result Date: 04/18/2021 CLINICAL DATA:  45 year old male with a history of cough EXAM: CHEST - 2 VIEW COMPARISON:  01/26/2021 FINDINGS: Cardiomediastinal silhouette unchanged in size and contour. There is a double density projecting over the left hilar region, posterior to the hilum on the lateral view. No evidence of central vascular congestion. No interlobular septal thickening. No pneumothorax or pleural effusion. Coarsened interstitial markings, with no confluent airspace disease. No acute displaced fracture. Mild degenerative changes of the spine. IMPRESSION: Negative for acute cardiopulmonary disease Mediastinal  mass is suspected overlying the left hilar region. Further evaluation with contrast-enhanced chest CT recommended. Electronically Signed   By: Corrie Mckusick D.O.   On: 04/18/2021 11:56   NM PET Image Initial (PI) Skull Base To Thigh  Result Date: 05/02/2021 CLINICAL DATA:  Initial treatment strategy for mediastinal mass. EXAM: NUCLEAR MEDICINE PET SKULL BASE TO THIGH TECHNIQUE: 8.5 mCi F-18 FDG was injected intravenously. Full-ring PET imaging was performed from the skull base to thigh after the radiotracer. CT data was obtained and used for attenuation correction and anatomic localization. Fasting blood glucose: 98 mg/dl COMPARISON:  None. FINDINGS: Mediastinal blood-pool activity (background): SUV max = 2.2 Liver activity (reference): SUV max = N/A NECK: Several sub-cm parotid lymph nodes are seen bilaterally which show hypermetabolic activity, greatest on the left with SUV max of 6.5, and on the right with SUV max of 4.1. Incidental CT findings:  None. CHEST: Hypermetabolic mass or lymphadenopathy is seen involving the left hilum and central left lower lobe, which measures 4.9 x 3.7 cm on image 77/4 and has SUV max of 11.7. Hypermetabolic mediastinal lymphadenopathy is seen in the lateral aortic region and AP window, with index area in the AP window measuring 2.3 x 2.1 cm on image 70/4, with SUV max of 11.3. A 1 cm hypermetabolic lymph node is also seen in the lower paraesophageal region, with SUV max of 5.9. Incidental CT findings:  None. ABDOMEN/PELVIS: No abnormal hypermetabolic activity within the liver, pancreas, adrenal glands, or spleen. Hypermetabolic lymphadenopathy is seen in the abdominal retroperitoneum in the aortocaval space, with largest lymph node measuring 2.3 x 2.1 cm on image 121/4, with SUV max of 8.6. No hypermetabolic masses or lymphadenopathy identified within the pelvis. Incidental CT findings:  None. SKELETON: No focal hypermetabolic bone lesions to suggest skeletal metastasis.  Incidental CT findings:  None. IMPRESSION: Hypermetabolic mass or lymphadenopathy involving the left hilum and central left lower lobe. Hypermetabolic lymphadenopathy also seen within the mediastinum, bilateral parotid glands, and abdominal retroperitoneum. Differential diagnosis includes lung carcinoma with metastatic disease and lymphoma. Electronically Signed   By: Marlaine Hind M.D.   On: 05/02/2021 15:49   DG Chest Port 1 View  Result Date: 05/12/2021 CLINICAL DATA:  Post via bronchoscopy with endobronchial ultrasound for mediastinal lymph node in LEFT lower lobe nodule. EXAM: PORTABLE CHEST 1 VIEW COMPARISON:  Apr 18, 2021. FINDINGS: EKG leads project over the chest. Abnormal LEFT mediastinal contours are similar to the prior exam. Heart size is stable.  Minimal haziness at the periphery of the LEFT chest may relate to recent biopsy. No lobar consolidation. No visible pneumothorax. On limited assessment no acute skeletal process. IMPRESSION: 1. Minimal haziness at the periphery of the LEFT chest may reflect changes of recent biopsy. 2. No visible pneumothorax. Electronically Signed   By: Zetta Bills M.D.   On: 05/12/2021 09:54       IMPRESSION/PLAN: 1. Stage IV, cT2N1M1b,NSCLC, poorly differentiated adenocarcinoma of the LLL. Dr. Lisbeth Renshaw discusses the pathology findings and reviews the nature of  metastatic lung cancer. We discussed the risks, benefits, short, and long term effects of radiotherapy to the chest, as well as the palliative intent, and the patient is interested in proceeding. Dr. Lisbeth Renshaw discusses the delivery and logistics of radiotherapy and anticipates a course of 2 weeks of radiotherapy to the left chest. He will simulate on Thursday morning prior to his appointment with Dr. Julien Nordmann and will sign written consent to proceed at that time. We will also proceed with an MRI of the brain to rule out disease.  Given current concerns for patient exposure during the COVID-19 pandemic, this  encounter was conducted via telephone.  The patient has provided two factor identification and has given verbal consent for this type of encounter and has been advised to only accept a meeting of this type in a secure network environment. The time spent during this encounter was 60 minutes including preparation, discussion, and coordination of the patient's care. The attendants for this meeting include Blenda Nicely, RN, Dr. Lisbeth Renshaw, Hayden Pedro  and Cameron Harper and his wife Cameron Harper  During the encounter,  Blenda Nicely, RN, Dr. Lisbeth Renshaw, and Hayden Pedro were located at [facility] Radiation Oncology Department.  Cameron Harper was located at home with his wife Cameron Harper.    The above documentation reflects my direct findings during this shared patient visit. Please see the separate note by Dr. Lisbeth Renshaw on this date for the remainder of the patient's plan of care.    Carola Rhine, [facility]   **Disclaimer: This note was dictated with voice recognition software. Similar sounding words can inadvertently be transcribed and this note may contain transcription errors which may not have been corrected upon publication of note.**

## 2021-05-17 ENCOUNTER — Ambulatory Visit: Payer: Managed Care, Other (non HMO) | Admitting: Pulmonary Disease

## 2021-05-17 VITALS — BP 106/68 | HR 82 | Ht 69.0 in | Wt 166.0 lb

## 2021-05-17 DIAGNOSIS — C3492 Malignant neoplasm of unspecified part of left bronchus or lung: Secondary | ICD-10-CM

## 2021-05-17 DIAGNOSIS — Z87891 Personal history of nicotine dependence: Secondary | ICD-10-CM | POA: Diagnosis not present

## 2021-05-17 DIAGNOSIS — R599 Enlarged lymph nodes, unspecified: Secondary | ICD-10-CM | POA: Diagnosis not present

## 2021-05-17 DIAGNOSIS — R053 Chronic cough: Secondary | ICD-10-CM

## 2021-05-17 DIAGNOSIS — R918 Other nonspecific abnormal finding of lung field: Secondary | ICD-10-CM

## 2021-05-17 NOTE — Progress Notes (Signed)
Synopsis: Referred in June 2022 for lung mass, PCP: By [name], MD  Subjective:   PATIENT ID: Cameron Harper GENDER: male DOB: [birth year], MRN: [id number]  Chief Complaint  Patient presents with   Follow-up    Biopsy results    This is a 45 year old gentleman, history of reflux, chronic back pain, eczema.  Patient had ongoing cough.  He was evaluated for cough, abnormal chest x-ray concerning for mass.  He underwent a nuclear medicine PET scan which revealed a hypermetabolic mass involving the left lung, left hilum central portion of the left lower lobe with associated hypermetabolic adenopathy in the mediastinum and abdominal retroperitoneal cavity.  Concern for a advanced stage bronchogenic carcinoma.  Patient was taken for tissue biopsy on 05/12/2021.  Patient underwent video bronchoscopy with endobronchial ultrasound transbronchial needle aspirations as well as endobronchial biopsies.  Patient was referred to radiation and medical oncology.  Patient was seen by radiation oncology on 05/16/2021.  Office note from Dr. Lisbeth Renshaw was [employer]  He has had a MRI of the brain ordered to complete staging.  And he has an appointment with medical oncology, Dr. Earlie Server tomorrow.   Oncology History   No history exists.     Past Medical History:  Diagnosis Date   Chronic back pain    Eczema    GERD (gastroesophageal reflux disease)    Lung cancer ([place])      Family History  Problem Relation Age of Onset   Heart attack Maternal Grandmother    CAD Maternal Grandmother    Diabetes Maternal Grandfather      Past Surgical History:  Procedure Laterality Date   BRONCHIAL BRUSHINGS  05/12/2021   Procedure: BRONCHIAL BRUSHINGS;  Surgeon: Garner Nash, DO;  Location: [facility];  Service: Pulmonary;;   BRONCHIAL DILITATION  05/12/2021   Procedure: BRONCHIAL DILITATION;  Surgeon: Garner Nash, DO;  Location: [facility];  Service: Pulmonary;;   BRONCHIAL NEEDLE  ASPIRATION BIOPSY  05/12/2021   Procedure: BRONCHIAL NEEDLE ASPIRATION BIOPSIES;  Surgeon: Garner Nash, DO;  Location: [facility];  Service: Pulmonary;;   BRONCHIAL WASHINGS  05/12/2021   Procedure: BRONCHIAL WASHINGS;  Surgeon: Garner Nash, DO;  Location: [place];  Service: Pulmonary;;   CRYOTHERAPY  05/12/2021   Procedure: CRYOTHERAPY;  Surgeon: Garner Nash, DO;  Location: [facility] [place] [facility];  Service: Pulmonary;;   HEMOSTASIS CONTROL  05/12/2021   Procedure: HEMOSTASIS CONTROL;  Surgeon: Garner Nash, DO;  Location: [facility];  Service: Pulmonary;;  epi injection   Spine injection     Pain control   VIDEO BRONCHOSCOPY WITH ENDOBRONCHIAL ULTRASOUND N/A 05/12/2021   Procedure: VIDEO BRONCHOSCOPY WITH ENDOBRONCHIAL ULTRASOUND;  Surgeon: Garner Nash, DO;  Location: [place] [facility];  Service: Pulmonary;  Laterality: N/A;   WISDOM TOOTH EXTRACTION     no anesthesia involed    Social History   Socioeconomic History   Marital status: Married    Spouse name: Not on file   Number of children: Not on file   Years of education: Not on file   Highest education level: Not on file  Occupational History   Not on file  Tobacco Use   Smoking status: Former    Packs/day: 1.00    Years: 25.00    Pack years: 25.00    Types: Cigarettes    Quit date: 04/18/2021    Years since quitting: 0.0   Smokeless tobacco: Never   Tobacco comments:    currently 7 cigs per day/  04/18/21//lmr  Vaping Use   Vaping Use: Not on file  Substance and Sexual Activity   Alcohol use: No   Drug use: No   Sexual activity: Not on file  Other Topics Concern   Not on file  Social History Narrative   Not on file   Social Determinants of Health   Financial Resource Strain: Not on file  Food Insecurity: Not on file  Transportation Needs: Not on file  Physical Activity: Not on file  Stress: Not on file  Social Connections: Not on file  Intimate Partner Violence: Not on file     Allergies   Allergen Reactions   Penicillins Other (See Comments)    Childhood allergy-Unknown reaction     Outpatient Medications Prior to Visit  Medication Sig Dispense Refill   acetaminophen (TYLENOL) 500 MG tablet Take 500-1,000 mg by mouth every 6 (six) hours as needed (for back pain.).     famotidine (PEPCID) 20 MG tablet One after supper 30 tablet 11   ibuprofen (ADVIL) 200 MG tablet Take 400-600 mg by mouth every 8 (eight) hours as needed (for pain.).     nicotine (NICODERM CQ - DOSED IN MG/24 HOURS) 21 mg/24hr patch Place 21 mg onto the skin daily.     pantoprazole (PROTONIX) 40 MG tablet Take 1 tablet (40 mg total) by mouth daily. Take 30-60 min before first meal of the day 30 tablet 2   No facility-administered medications prior to visit.    Review of Systems  Constitutional:  Negative for chills, fever, malaise/fatigue and weight loss.  HENT:  Negative for hearing loss, sore throat and tinnitus.   Eyes:  Negative for blurred vision and double vision.  Respiratory:  Positive for cough. Negative for hemoptysis, sputum production, shortness of breath, wheezing and stridor.   Cardiovascular:  Negative for chest pain, palpitations, orthopnea, leg swelling and PND.  Gastrointestinal:  Negative for abdominal pain, constipation, diarrhea, heartburn, nausea and vomiting.  Genitourinary:  Negative for dysuria, hematuria and urgency.  Musculoskeletal:  Negative for joint pain and myalgias.  Skin:  Negative for itching and rash.  Neurological:  Negative for dizziness, tingling, weakness and headaches.  Endo/Heme/Allergies:  Negative for environmental allergies. Does not bruise/bleed easily.  Psychiatric/Behavioral:  Negative for depression. The patient is not nervous/anxious and does not have insomnia.   All other systems reviewed and are negative.   Objective:  Physical Exam Vitals reviewed.  Constitutional:      General: He is not in acute distress.    Appearance: He is well-developed.   HENT:     Head: Normocephalic and atraumatic.  Eyes:     General: No scleral icterus.    Conjunctiva/sclera: Conjunctivae normal.     Pupils: Pupils are equal, round, and reactive to light.  Neck:     Vascular: No JVD.     Trachea: No tracheal deviation.  Cardiovascular:     Rate and Rhythm: Normal rate and regular rhythm.     Heart sounds: Normal heart sounds. No murmur heard. Pulmonary:     Effort: Pulmonary effort is normal. No tachypnea, accessory muscle usage or respiratory distress.     Breath sounds: No stridor. Rhonchi present. No wheezing or rales.     Comments: Left-sided rhonchi Abdominal:     General: Bowel sounds are normal. There is no distension.     Palpations: Abdomen is soft.     Tenderness: There is no abdominal tenderness.  Musculoskeletal:        General:  No tenderness.     Cervical back: Neck supple.  Lymphadenopathy:     Cervical: No cervical adenopathy.  Skin:    General: Skin is warm and dry.     Capillary Refill: Capillary refill takes less than 2 seconds.     Findings: No rash.  Neurological:     Mental Status: He is alert and oriented to person, place, and time.  Psychiatric:        Behavior: Behavior normal.     Vitals:   05/17/21 0954  BP: 106/68  Pulse: 82  SpO2: 99%  Weight: 166 lb (75.3 kg)  Height: 5\' 9"  (1.753 m)   99% on RA BMI Readings from Last 3 Encounters:  05/17/21 24.51 kg/m  05/16/21 25.10 kg/m  05/12/21 25.10 kg/m   Wt Readings from Last 3 Encounters:  05/17/21 166 lb (75.3 kg)  05/16/21 170 lb (77.1 kg)  05/11/21 170 lb (77.1 kg)     CBC    Component Value Date/Time   WBC 8.4 04/03/2021 1049   RBC 4.79 04/03/2021 1049   HGB 14.2 04/03/2021 1049   HCT 44.5 04/03/2021 1049   PLT 266 04/03/2021 1049   MCV 92.9 04/03/2021 1049   MCH 29.6 04/03/2021 1049   MCHC 31.9 04/03/2021 1049   RDW 12.6 04/03/2021 1049   LYMPHSABS 1.9 04/03/2021 1049   MONOABS 0.7 04/03/2021 1049   EOSABS 0.3 04/03/2021 1049    BASOSABS 0.1 04/03/2021 1049    Chest Imaging: 05/02/2021 nuclear medicine pet imaging: Large 5 cm hypermetabolic left-sided lung mass concerning for advanced age bronchogenic carcinoma with associated adenopathy. The patient's images have been independently reviewed by me.    Pulmonary Functions Testing Results: No flowsheet data found.  FeNO:   Pathology:   Bronchoscopy 05/12/2021: FINAL MICROSCOPIC DIAGNOSIS:  A. LYMPH NODE, 4L, FINE NEEDLE ASPIRATION:  - Malignant cells consistent with non-small cell carcinoma  B. LUNG, LEFT MAINSTEM, BRUSHING:  - Malignant cells consistent with non-small cell carcinoma   Echocardiogram:   Heart Catheterization:     Assessment & Plan:     ICD-10-CM   1. NSCLC of left lung (HCC)  C34.92     2. Mass of left lung  R91.8     3. Former smoker  Z87.891     4. Adenopathy  R59.9     5. Chronic cough  R05.3     6. Endobronchial cancer, left [facility])  C34.92       Discussion:  45 year old gentleman recent diagnosis of non-small cell lung cancer, appears to be advanced stage with metastasis to retroperitoneal nodes concerning for stage IV disease.  Patient underwent bronchoscopy with tissue sampling and biopsy.  He also has significant left-sided endobronchial disease.  Bronchoscopy on the left side to help relieve obstruction.  Patient was referred to radiation oncology to start radiation treatments as soon as possible.  He has medical oncology follow-up scheduled tomorrow.  Plan: Discussed all of the recent events. Discussed bronchoscopy results. Answered questions for patient and patient's wife regarding next steps. Spent time in the office today counseling and reviewing images with the patient's wife that had several questions. From a respiratory standpoint I think once he is able to start radiation treatments will help due to his left-sided endobronchial obstruction.  They are supposed to call me if he has any change in his respiratory  symptoms or develop hemoptysis.  RTC in 3 months or as needed  Lives [facility], nonprofit lung cancer organization bag was given to  patient today in the office.  Help connect with local support group.  He would likely be connected with the local [place] lung cancer support group this week after appointments as well.   Current Outpatient Medications:    acetaminophen (TYLENOL) 500 MG tablet, Take 500-1,000 mg by mouth every 6 (six) hours as needed (for back pain.)., Disp: , Rfl:    famotidine (PEPCID) 20 MG tablet, One after supper, Disp: 30 tablet, Rfl: 11   ibuprofen (ADVIL) 200 MG tablet, Take 400-600 mg by mouth every 8 (eight) hours as needed (for pain.)., Disp: , Rfl:    nicotine (NICODERM CQ - DOSED IN MG/24 HOURS) 21 mg/24hr patch, Place 21 mg onto the skin daily., Disp: , Rfl:    pantoprazole (PROTONIX) 40 MG tablet, Take 1 tablet (40 mg total) by mouth daily. Take 30-60 min before first meal of the day, Disp: 30 tablet, Rfl: 2  I spent 31 minutes dedicated to the care of this patient on the date of this encounter to include pre-visit review of records, face-to-face time with the patient discussing conditions above, post visit ordering of testing, clinical documentation with the electronic health record, making appropriate referrals as documented, and communicating necessary findings to members of the patients care team.   Garner Nash, DO [facility] 05/17/2021 11:49 AM

## 2021-05-17 NOTE — Patient Instructions (Signed)
Thank you for visiting Dr. Valeta Harms at [facility]. Today we recommend the following:  Return in about 3 months (around 08/17/2021).    Please do your part to reduce the spread of COVID-19.

## 2021-05-18 ENCOUNTER — Ambulatory Visit
Admission: RE | Admit: 2021-05-18 | Discharge: 2021-05-18 | Disposition: A | Discharge status: Home / Self Care | Payer: Managed Care, Other (non HMO) | Source: Ambulatory Visit | Attending: Radiation Oncology | Admitting: Radiation Oncology

## 2021-05-18 ENCOUNTER — Telehealth: Payer: Self-pay | Admitting: *Deleted

## 2021-05-18 ENCOUNTER — Other Ambulatory Visit: Payer: Self-pay

## 2021-05-18 ENCOUNTER — Ambulatory Visit: Payer: Managed Care, Other (non HMO)

## 2021-05-18 ENCOUNTER — Encounter: Payer: Self-pay | Admitting: *Deleted

## 2021-05-18 ENCOUNTER — Encounter: Payer: Self-pay | Admitting: Internal Medicine

## 2021-05-18 ENCOUNTER — Inpatient Hospital Stay: Payer: Managed Care, Other (non HMO) | Admitting: Internal Medicine

## 2021-05-18 ENCOUNTER — Encounter: Payer: Self-pay | Admitting: Emergency Medicine

## 2021-05-18 ENCOUNTER — Ambulatory Visit: Payer: Managed Care, Other (non HMO) | Admitting: Radiation Oncology

## 2021-05-18 VITALS — BP 129/73 | HR 79 | Temp 98.2°F | Resp 18 | Ht 69.0 in | Wt 164.6 lb

## 2021-05-18 DIAGNOSIS — Z923 Personal history of irradiation: Secondary | ICD-10-CM | POA: Insufficient documentation

## 2021-05-18 DIAGNOSIS — F17210 Nicotine dependence, cigarettes, uncomplicated: Secondary | ICD-10-CM | POA: Insufficient documentation

## 2021-05-18 DIAGNOSIS — C3432 Malignant neoplasm of lower lobe, left bronchus or lung: Secondary | ICD-10-CM

## 2021-05-18 DIAGNOSIS — R599 Enlarged lymph nodes, unspecified: Secondary | ICD-10-CM | POA: Diagnosis not present

## 2021-05-18 DIAGNOSIS — Z510 Encounter for antineoplastic radiation therapy: Secondary | ICD-10-CM | POA: Insufficient documentation

## 2021-05-18 DIAGNOSIS — K118 Other diseases of salivary glands: Secondary | ICD-10-CM | POA: Diagnosis not present

## 2021-05-18 DIAGNOSIS — Z5111 Encounter for antineoplastic chemotherapy: Secondary | ICD-10-CM | POA: Insufficient documentation

## 2021-05-18 DIAGNOSIS — Z8616 Personal history of COVID-19: Secondary | ICD-10-CM

## 2021-05-18 DIAGNOSIS — K111 Hypertrophy of salivary gland: Secondary | ICD-10-CM | POA: Insufficient documentation

## 2021-05-18 DIAGNOSIS — M5136 Other intervertebral disc degeneration, lumbar region: Secondary | ICD-10-CM | POA: Insufficient documentation

## 2021-05-18 DIAGNOSIS — R634 Abnormal weight loss: Secondary | ICD-10-CM

## 2021-05-18 DIAGNOSIS — Z87891 Personal history of nicotine dependence: Secondary | ICD-10-CM

## 2021-05-18 DIAGNOSIS — G8929 Other chronic pain: Secondary | ICD-10-CM

## 2021-05-18 DIAGNOSIS — M5126 Other intervertebral disc displacement, lumbar region: Secondary | ICD-10-CM | POA: Insufficient documentation

## 2021-05-18 DIAGNOSIS — R590 Localized enlarged lymph nodes: Secondary | ICD-10-CM | POA: Insufficient documentation

## 2021-05-18 DIAGNOSIS — M549 Dorsalgia, unspecified: Secondary | ICD-10-CM

## 2021-05-18 NOTE — Research (Signed)
Trial:  [id number] NSCLC - Customer service manager for the Discovery and Validation of Biomarkers for the Prediction, Diagnosis, and Management of Disease Patient Cameron Harper was identified by this Research officer, political party as a potential candidate for the above listed study.  This Clinical Research Coordinator met with Cameron Harper, MRN [id number], on 05/18/21 in a manner and location that ensures patient privacy to discuss participation in the above listed research study.  A copy of the informed consent document and separate HIPAA Authorization was provided to the patient.  Patient reads, speaks, and understands [place].   Patient was provided with the business card of this Coordinator and encouraged to contact the research team with any questions.  Approximately 10 minutes were spent with the patient reviewing the informed consent documents.  Patient was provided the option of taking informed consent documents home to review and was encouraged to review at their convenience with their support network, including other care providers. Patient took the consent documents home to review. Plan to follow up with patient via phone next week.  Clabe Seal Clinical Research Coordinator I  05/18/21 12:53 PM

## 2021-05-18 NOTE — Telephone Encounter (Signed)
Called patient to inform of MRI for 06-02-21 - arrival time - 3:30 pm @ [facility], no restrictions to test, spoke with patient and he is aware of this test

## 2021-05-18 NOTE — Progress Notes (Signed)
Oncology Nurse Navigator Documentation  Oncology Nurse Navigator Flowsheets 05/18/2021  Abnormal Finding Date 04/18/2021  Confirmed Diagnosis Date 05/12/2021  Diagnosis Status Pending Molecular Studies  Planned Course of Treatment Radiation  Phase of Treatment Radiation  Navigator Follow Up Date: 05/23/2021  Navigator Follow Up Reason: Awaiting Molecular Testing  Navigator Location [facility]  Referral Date to RadOnc/MedOnc 05/12/2021  Navigator Encounter Type Clinic/MDC  Patient Visit Type Initial;MedOnc/spoke to patient and his wife today. He is newly dx lung cancer patient. I gave and explained information on his dx.  Per Dr. Julien Nordmann, I completed request for tissue to be send for molecular testing.  I also contacted [facility] rep to update them that the fax is coming. Will follow up with [facility].   Treatment Phase Pre-Tx/Tx Discussion  Barriers/Navigation Needs Education;Coordination of Care  Education Newly Diagnosed Cancer Education;Other  Interventions Psycho-Social Support;Coordination of Care  Acuity Level 3-Moderate Needs (3-4 Barriers Identified)  Coordination of Care Other  Time Spent with Patient 45

## 2021-05-18 NOTE — Progress Notes (Signed)
[place] [phone]   [phone]  CONSULT NOTE  REFERRING PHYSICIAN: Dr. Leory Plowman Icard  REASON FOR CONSULTATION:  45 years old white male recently diagnosed with lung cancer.  HPI Cameron Harper is a 45 y.o. male with past medical history significant for chronic back pain, eczema and GERD as well as long history of smoking.  The patient mentioned that in September 2021 he was diagnosed with COVID-19 with mild symptoms initially.  2 months later in November 2021 he started having more cough and was seen by his primary care physician and treated with a course of antibiotics.  He has initial improvement but in January 2022 he started having more cough and sinus infection and treated again with a course of antibiotics with mild improvement.  He kept having recurrent symptoms and refractory cough as well as chest tightness and shortness of breath.  He was referred to Dr. Melvyn Novas for evaluation and initially chest x-ray was unremarkable.  Repeat chest x-ray on 04/18/2021 showed mediastinal mass suspected overlying the left hilar region.  The patient had a PET scan on May 02, 2021 and it showed hypermetabolic mass or lymphadenopathy involving the left hilum and central left lower lobe measuring 4.9 x 3.7 cm with SUV max of 11.7.  There was hypermetabolic mediastinal lymphadenopathy seen in the lateral aortic region and AP window with index area in the AP window measuring 2.3 x 2.1 cm with SUV max of 11.3.  A 1.0 cm hypermetabolic lymph node is also seen in the lower paraesophageal region with SUV max of 5.9.  There was also several subcentimeter parotid lymph nodes seen bilaterally with hypermetabolic activity greatest on the left with SUV max of 6.5 and on the right with SUV max of 4.1.  The PET scan also showed hypermetabolic lymphadenopathy seen in the abdominal retroperitoneum in the aortocaval space with largest lymph node measuring 2.3 x 2.1 cm and SUV max of  8.6.  There is no other evidence of additional metastatic disease or skeletal bone metastasis. The patient was referred to Dr. Valeta Harms and on May 12, 2021 he underwent video bronchoscopy with endobronchial ultrasound procedure as well as endobronchial cryotherapy, cryobiopsies, tumor debulking and relief of obstruction in addition to left mainstem airway balloon dilatation. The final pathology ([id number]) showed non-small cell carcinoma. The malignant infiltrate is positive with TTF-1 and negative with cytokeratin 5/6 and p63.  The immunophenotype is most suggestive of poorly differentiated adenocarcinoma. Dr. Valeta Harms kindly referred the patient to me today for evaluation and recommendation regarding treatment of his condition.  The patient was also seen by radiation oncology for consideration of palliative radiotherapy to the bulky mediastinal and left lower lobe central lung mass. When seen today he is feeling fine except for the chronic back pain more in the iliac area with radiation to the leg.  He is very anxious and concerned about his recent diagnosis.  He denied having any chest pain or shortness of breath but has mild cough with no hemoptysis.  He lost around 15 pounds in the last 18 months.  He denied having any nausea, vomiting, diarrhea or constipation.  He has no headache or visual changes. Family history significant for mother who is healthy at age 59.  Father died from pulmonary embolism after a hip procedure. The patient is married and has 2 children a son age 21 and daughter age 67.  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  He was accompanied  by his wife Helene Kelp.  The patient has a history of smoking 1 pack/day for around 25 years and quit in May 2022.  He has no history of alcohol or drug abuse.   HPI  Past Medical History:  Diagnosis Date   Chronic back pain    Eczema    GERD (gastroesophageal reflux disease)    Lung cancer [facility])     Past Surgical History:   Procedure Laterality Date   BRONCHIAL BRUSHINGS  05/12/2021   Procedure: BRONCHIAL BRUSHINGS;  Surgeon: Garner Nash, DO;  Location: [place] ENDOSCOPY;  Service: Pulmonary;;   BRONCHIAL DILITATION  05/12/2021   Procedure: BRONCHIAL DILITATION;  Surgeon: Garner Nash, DO;  Location: [place] ENDOSCOPY;  Service: Pulmonary;;   BRONCHIAL NEEDLE ASPIRATION BIOPSY  05/12/2021   Procedure: BRONCHIAL NEEDLE ASPIRATION BIOPSIES;  Surgeon: Garner Nash, DO;  Location: [place] ENDOSCOPY;  Service: Pulmonary;;   BRONCHIAL WASHINGS  05/12/2021   Procedure: BRONCHIAL WASHINGS;  Surgeon: Garner Nash, DO;  Location: [place] ENDOSCOPY;  Service: Pulmonary;;   CRYOTHERAPY  05/12/2021   Procedure: CRYOTHERAPY;  Surgeon: Garner Nash, DO;  Location: [place] ENDOSCOPY;  Service: Pulmonary;;   HEMOSTASIS CONTROL  05/12/2021   Procedure: HEMOSTASIS CONTROL;  Surgeon: Garner Nash, DO;  Location: [place] ENDOSCOPY;  Service: Pulmonary;;  epi injection   Spine injection     Pain control   VIDEO BRONCHOSCOPY WITH ENDOBRONCHIAL ULTRASOUND N/A 05/12/2021   Procedure: VIDEO BRONCHOSCOPY WITH ENDOBRONCHIAL ULTRASOUND;  Surgeon: Garner Nash, DO;  Location: [place];  Service: Pulmonary;  Laterality: N/A;   WISDOM TOOTH EXTRACTION     no anesthesia involed    Family History  Problem Relation Age of Onset   Heart attack Maternal Grandmother    CAD Maternal Grandmother    Diabetes Maternal Grandfather     Social History Social History   Tobacco Use   Smoking status: Former    Packs/day: 1.00    Years: 25.00    Pack years: 25.00    Types: Cigarettes    Quit date: 04/18/2021    Years since quitting: 0.0   Smokeless tobacco: Never   Tobacco comments:    currently 7 cigs per day/ 04/18/21//lmr  Substance Use Topics   Alcohol use: No   Drug use: No    Allergies  Allergen Reactions   Penicillins Other (See Comments)    Childhood allergy-Unknown reaction    Current Outpatient Medications  Medication Sig  Dispense Refill   acetaminophen (TYLENOL) 500 MG tablet Take 500-1,000 mg by mouth every 6 (six) hours as needed (for back pain.).     famotidine (PEPCID) 20 MG tablet One after supper 30 tablet 11   ibuprofen (ADVIL) 200 MG tablet Take 400-600 mg by mouth every 8 (eight) hours as needed (for pain.).     nicotine (NICODERM CQ - DOSED IN MG/24 HOURS) 21 mg/24hr patch Place 21 mg onto the skin daily.     pantoprazole (PROTONIX) 40 MG tablet Take 1 tablet (40 mg total) by mouth daily. Take 30-60 min before first meal of the day 30 tablet 2   No current facility-administered medications for this visit.    Review of Systems  Constitutional: positive for fatigue and weight loss Eyes: negative Ears, nose, mouth, throat, and face: negative Respiratory: positive for cough Cardiovascular: negative Gastrointestinal: negative Genitourinary:negative Integument/breast: negative Hematologic/lymphatic: negative Musculoskeletal:positive for back pain Neurological: negative Behavioral/Psych: negative Endocrine: negative Allergic/Immunologic: negative  Physical Exam  DTO:IZTIW, healthy, no distress, well nourished, well  developed, and anxious SKIN: skin color, texture, turgor are normal, no rashes or significant lesions HEAD: Normocephalic, No masses, lesions, tenderness or abnormalities EYES: normal, PERRLA EARS: External ears normal, Canals clear OROPHARYNX:no exudate, no erythema, and lips, buccal mucosa, and tongue normal  NECK: supple, no adenopathy LYMPH:  no palpable lymphadenopathy, no hepatosplenomegaly LUNGS: clear to auscultation , and palpation HEART: regular rate & rhythm, no murmurs, and no gallops ABDOMEN:abdomen soft, non-tender, and normal bowel sounds BACK: No CVA tenderness, Range of motion is normal EXTREMITIES:no joint deformities, effusion, or inflammation, no edema  NEURO: alert & oriented x 3 with fluent speech, no focal motor/sensory deficits  PERFORMANCE STATUS:  ECOG 1  LABORATORY DATA: Lab Results  Component Value Date   WBC 8.4 04/03/2021   HGB 14.2 04/03/2021   HCT 44.5 04/03/2021   MCV 92.9 04/03/2021   PLT 266 04/03/2021      Chemistry   No results found for: NA, K, CL, CO2, BUN, CREATININE, GLU No results found for: CALCIUM, ALKPHOS, AST, ALT, BILITOT     RADIOGRAPHIC STUDIES: NM PET Image Initial (PI) Skull Base To Thigh  Result Date: 05/02/2021 CLINICAL DATA:  Initial treatment strategy for mediastinal mass. EXAM: NUCLEAR MEDICINE PET SKULL BASE TO THIGH TECHNIQUE: 8.5 mCi F-18 FDG was injected intravenously. Full-ring PET imaging was performed from the skull base to thigh after the radiotracer. CT data was obtained and used for attenuation correction and anatomic localization. Fasting blood glucose: 98 mg/dl COMPARISON:  None. FINDINGS: Mediastinal blood-pool activity (background): SUV max = 2.2 Liver activity (reference): SUV max = N/A NECK: Several sub-cm parotid lymph nodes are seen bilaterally which show hypermetabolic activity, greatest on the left with SUV max of 6.5, and on the right with SUV max of 4.1. Incidental CT findings:  None. CHEST: Hypermetabolic mass or lymphadenopathy is seen involving the left hilum and central left lower lobe, which measures 4.9 x 3.7 cm on image 77/4 and has SUV max of 11.7. Hypermetabolic mediastinal lymphadenopathy is seen in the lateral aortic region and AP window, with index area in the AP window measuring 2.3 x 2.1 cm on image 70/4, with SUV max of 11.3. A 1 cm hypermetabolic lymph node is also seen in the lower paraesophageal region, with SUV max of 5.9. Incidental CT findings:  None. ABDOMEN/PELVIS: No abnormal hypermetabolic activity within the liver, pancreas, adrenal glands, or spleen. Hypermetabolic lymphadenopathy is seen in the abdominal retroperitoneum in the aortocaval space, with largest lymph node measuring 2.3 x 2.1 cm on image 121/4, with SUV max of 8.6. No hypermetabolic masses or  lymphadenopathy identified within the pelvis. Incidental CT findings:  None. SKELETON: No focal hypermetabolic bone lesions to suggest skeletal metastasis. Incidental CT findings:  None. IMPRESSION: Hypermetabolic mass or lymphadenopathy involving the left hilum and central left lower lobe. Hypermetabolic lymphadenopathy also seen within the mediastinum, bilateral parotid glands, and abdominal retroperitoneum. Differential diagnosis includes lung carcinoma with metastatic disease and lymphoma. Electronically Signed   By: Marlaine Hind M.D.   On: 05/02/2021 15:49   DG Chest Port 1 View  Result Date: 05/12/2021 CLINICAL DATA:  Post via bronchoscopy with endobronchial ultrasound for mediastinal lymph node in LEFT lower lobe nodule. EXAM: PORTABLE CHEST 1 VIEW COMPARISON:  Apr 18, 2021. FINDINGS: EKG leads project over the chest. Abnormal LEFT mediastinal contours are similar to the prior exam. Heart size is stable. Minimal haziness at the periphery of the LEFT chest may relate to recent biopsy. No lobar consolidation. No visible pneumothorax. On  limited assessment no acute skeletal process. IMPRESSION: 1. Minimal haziness at the periphery of the LEFT chest may reflect changes of recent biopsy. 2. No visible pneumothorax. Electronically Signed   By: Zetta Bills M.D.   On: 05/12/2021 09:54    ASSESSMENT: This is a very pleasant 45 years old white male recently diagnosed with stage IV (T2b, N2, M1 B) non-small cell lung cancer, adenocarcinoma presented with large central left lower lobe lung mass with left hilar and mediastinal lymphadenopathy as well as abdominal retroperitoneal lymphadenopathy diagnosed in May 2022.  The patient also has bilateral parotid gland nodules that need close monitoring.   PLAN: I had a lengthy discussion with the patient and his wife today about his current disease stage, prognosis and treatment options.  I personally and independently reviewed the scan images and discussed the  result and showed the images to the patient and his wife. I recommended for the patient to complete the staging work-up with MRI of the brain to rule out brain metastasis. I explained to the patient that he has incurable condition and all the treatment will be of palliative nature. I recommended for the patient to send the tissue block for molecular studies by [facility]. I also recommend for the patient to proceed with the palliative course of radiotherapy as recommended by Dr. Lisbeth Renshaw. I discussed with him the treatment options including palliative care versus palliative systemic chemotherapy with carboplatin for AUC of 5, Alimta 500 Mg/M2 and Keytruda 200 Mg IV every 3 weeks if the patient has no actionable mutations versus treatment with targeted therapy if the molecular studies showed an actionable mutation. The patient will come back for follow-up visit in 2 weeks for more detailed discussion of his treatment options based on the final molecular studies and staging work-up. For the smoking cessation was strongly encouraged the patient to continue with the smoking cessation at this point. He was advised to call immediately if he has any other concerning symptoms in the interval.  The patient voices understanding of current disease status and treatment options and is in agreement with the current care plan.  All questions were answered. The patient knows to call the clinic with any problems, questions or concerns. We can certainly see the patient much sooner if necessary.  Thank you so much for allowing me to participate in the care of Cameron Harper. I will continue to follow up the patient with you and assist in his care.  The total time spent in the appointment was 90 minutes.  Disclaimer: This note was dictated with voice recognition software. Similar sounding words can inadvertently be transcribed and may not be corrected upon review.   Eilleen Kempf May 18, 2021, 12:10 PM

## 2021-05-19 ENCOUNTER — Encounter: Payer: Self-pay | Admitting: General Practice

## 2021-05-19 DIAGNOSIS — Z510 Encounter for antineoplastic radiation therapy: Secondary | ICD-10-CM | POA: Diagnosis not present

## 2021-05-19 NOTE — Progress Notes (Signed)
[facility] Psychosocial Distress Screening Clinical Social Work  Clinical Social Work was referred by distress screening protocol.  The patient scored a 7 on the Psychosocial Distress Thermometer which indicates moderate distress. Clinical Social Worker contacted patient by phone to assess for distress and other psychosocial needs. He is aware of his Stage IV cancer and proposed treatments "that can help."  "I am just trusting in God."  Have "good church family, work family, and family."  "We are not giving up no matter what they say, it is in [employer]."  He is working "the best I can."  Realizes he may need to stop at some time.  Has short and long term disability available to him.    ONCBCN DISTRESS SCREENING 05/16/2021  Screening Type Initial Screening  Distress experienced in past week (1-10) 7  Emotional problem type Nervousness/Anxiety;Adjusting to illness    Clinical Social Worker follow up needed: No.  If yes, follow up plan:  Beverely Pace, [place], LCSW Clinical Social Worker Phone:  [phone]

## 2021-05-22 ENCOUNTER — Other Ambulatory Visit: Payer: Self-pay

## 2021-05-22 ENCOUNTER — Encounter: Payer: Self-pay | Admitting: *Deleted

## 2021-05-22 ENCOUNTER — Ambulatory Visit
Admission: RE | Admit: 2021-05-22 | Discharge: 2021-05-22 | Disposition: A | Discharge status: Home / Self Care | Payer: Managed Care, Other (non HMO) | Source: Ambulatory Visit | Attending: Radiation Oncology | Admitting: Radiation Oncology

## 2021-05-22 DIAGNOSIS — Z510 Encounter for antineoplastic radiation therapy: Secondary | ICD-10-CM | POA: Diagnosis not present

## 2021-05-22 NOTE — Progress Notes (Signed)
I faxed request for [facility] to complete molecular testing. I followed up on their patient portal for Dr. Julien Nordmann and did not see an update. I contacted them to request an update.

## 2021-05-22 NOTE — Progress Notes (Signed)
I received notification that [facility] has received Mr. Ransford tissue for testing.

## 2021-05-23 ENCOUNTER — Other Ambulatory Visit: Payer: Self-pay

## 2021-05-23 ENCOUNTER — Ambulatory Visit
Admission: RE | Admit: 2021-05-23 | Discharge: 2021-05-23 | Disposition: A | Discharge status: Home / Self Care | Payer: Managed Care, Other (non HMO) | Source: Ambulatory Visit | Attending: Radiation Oncology | Admitting: Radiation Oncology

## 2021-05-23 DIAGNOSIS — Z510 Encounter for antineoplastic radiation therapy: Secondary | ICD-10-CM | POA: Diagnosis not present

## 2021-05-24 ENCOUNTER — Ambulatory Visit
Admission: RE | Admit: 2021-05-24 | Discharge: 2021-05-24 | Disposition: A | Discharge status: Home / Self Care | Payer: Managed Care, Other (non HMO) | Source: Ambulatory Visit | Attending: Radiation Oncology | Admitting: Radiation Oncology

## 2021-05-24 DIAGNOSIS — Z510 Encounter for antineoplastic radiation therapy: Secondary | ICD-10-CM | POA: Diagnosis not present

## 2021-05-25 ENCOUNTER — Other Ambulatory Visit: Payer: Self-pay

## 2021-05-25 ENCOUNTER — Ambulatory Visit
Admission: RE | Admit: 2021-05-25 | Discharge: 2021-05-25 | Disposition: A | Discharge status: Home / Self Care | Payer: Managed Care, Other (non HMO) | Source: Ambulatory Visit | Attending: Radiation Oncology | Admitting: Radiation Oncology

## 2021-05-25 DIAGNOSIS — Z510 Encounter for antineoplastic radiation therapy: Secondary | ICD-10-CM | POA: Diagnosis not present

## 2021-05-26 ENCOUNTER — Ambulatory Visit
Admission: RE | Admit: 2021-05-26 | Discharge: 2021-05-26 | Disposition: A | Discharge status: Home / Self Care | Payer: Managed Care, Other (non HMO) | Source: Ambulatory Visit | Attending: Radiation Oncology | Admitting: Radiation Oncology

## 2021-05-26 ENCOUNTER — Telehealth: Payer: Self-pay | Admitting: *Deleted

## 2021-05-26 DIAGNOSIS — Z510 Encounter for antineoplastic radiation therapy: Secondary | ICD-10-CM | POA: Diagnosis not present

## 2021-05-26 DIAGNOSIS — R918 Other nonspecific abnormal finding of lung field: Secondary | ICD-10-CM

## 2021-05-26 DIAGNOSIS — C3432 Malignant neoplasm of lower lobe, left bronchus or lung: Secondary | ICD-10-CM

## 2021-05-26 MED ORDER — SONAFINE EX EMUL
1.0000 "application " | Freq: Once | CUTANEOUS | Status: AC
  Administered 2021-05-26: 1 via TOPICAL

## 2021-05-26 NOTE — Progress Notes (Signed)
Pt here for patient teaching.    Pt given Radiation and You booklet, skin care instructions, and Sonafine.    Reviewed areas of pertinence such as diarrhea, fatigue, hair loss, skin changes, throat changes, headache, cough, and shortness of breath .   Pt able to give teach back to pat skin, use unscented/gentle soap, and drink plenty of water,apply Sonafine bid and avoid applying anything to skin within 4 hours of treatment.   Pt verbalizes understanding of information given and will contact nursing with any questions or concerns.    [email or web address]

## 2021-05-26 NOTE — Telephone Encounter (Signed)
CALLED PATIENT TO INFORM OF NUTRITION APPT. FOR 06-02-21 @ 1:45 PM WITH JESSICA CLAYTON, SPOKE WITH PATIENT AND HE IS AWARE OF THIS APPT.

## 2021-05-29 ENCOUNTER — Ambulatory Visit
Admission: RE | Admit: 2021-05-29 | Discharge: 2021-05-29 | Disposition: A | Discharge status: Home / Self Care | Payer: Managed Care, Other (non HMO) | Source: Ambulatory Visit | Attending: Radiation Oncology | Admitting: Radiation Oncology

## 2021-05-29 ENCOUNTER — Other Ambulatory Visit: Payer: Self-pay

## 2021-05-29 DIAGNOSIS — Z510 Encounter for antineoplastic radiation therapy: Secondary | ICD-10-CM | POA: Diagnosis not present

## 2021-05-30 ENCOUNTER — Other Ambulatory Visit: Payer: Self-pay

## 2021-05-30 ENCOUNTER — Ambulatory Visit
Admission: RE | Admit: 2021-05-30 | Discharge: 2021-05-30 | Disposition: A | Discharge status: Home / Self Care | Payer: Managed Care, Other (non HMO) | Source: Ambulatory Visit | Attending: Radiation Oncology | Admitting: Radiation Oncology

## 2021-05-30 DIAGNOSIS — Z510 Encounter for antineoplastic radiation therapy: Secondary | ICD-10-CM | POA: Diagnosis not present

## 2021-05-31 ENCOUNTER — Ambulatory Visit
Admission: RE | Admit: 2021-05-31 | Discharge: 2021-05-31 | Disposition: A | Discharge status: Home / Self Care | Payer: Managed Care, Other (non HMO) | Source: Ambulatory Visit | Attending: Radiation Oncology | Admitting: Radiation Oncology

## 2021-05-31 ENCOUNTER — Inpatient Hospital Stay (HOSPITAL_BASED_OUTPATIENT_CLINIC_OR_DEPARTMENT_OTHER): Payer: Managed Care, Other (non HMO) | Admitting: Internal Medicine

## 2021-05-31 ENCOUNTER — Inpatient Hospital Stay: Payer: Managed Care, Other (non HMO)

## 2021-05-31 VITALS — BP 133/63 | HR 84 | Temp 98.6°F | Resp 20 | Ht 69.0 in | Wt 168.2 lb

## 2021-05-31 DIAGNOSIS — C3432 Malignant neoplasm of lower lobe, left bronchus or lung: Secondary | ICD-10-CM | POA: Diagnosis not present

## 2021-05-31 DIAGNOSIS — Z5111 Encounter for antineoplastic chemotherapy: Secondary | ICD-10-CM

## 2021-05-31 DIAGNOSIS — Z510 Encounter for antineoplastic radiation therapy: Secondary | ICD-10-CM | POA: Diagnosis not present

## 2021-05-31 LAB — CMP (CANCER CENTER ONLY)
ALT: 14 U/L (ref 0–44)
AST: 12 U/L — ABNORMAL LOW (ref 15–41)
Albumin: 3.3 g/dL — ABNORMAL LOW (ref 3.5–5.0)
Alkaline Phosphatase: 98 U/L (ref 38–126)
Anion gap: 13 (ref 5–15)
BUN: 8 mg/dL (ref 6–20)
CO2: 24 mmol/L (ref 22–32)
Calcium: 9.4 mg/dL (ref 8.9–10.3)
Chloride: 107 mmol/L (ref 98–111)
Creatinine: 0.76 mg/dL (ref 0.61–1.24)
GFR, Estimated: >60 mL/min (ref >60)
Glucose, Bld: 125 mg/dL — ABNORMAL HIGH (ref 70–99)
Potassium: 3.5 mmol/L (ref 3.5–5.1)
Sodium: 144 mmol/L (ref 135–145)
Total Bilirubin: 0.3 mg/dL (ref 0.3–1.2)
Total Protein: 7.2 g/dL (ref 6.5–8.1)

## 2021-05-31 LAB — CBC WITH DIFFERENTIAL (CANCER CENTER ONLY)
Abs Immature Granulocytes: 0.02 10*3/uL (ref 0.00–0.07)
Basophils Absolute: 0.1 10*3/uL (ref 0.0–0.1)
Basophils Relative: 1 %
Eosinophils Absolute: 0.3 10*3/uL (ref 0.0–0.5)
Eosinophils Relative: 4 %
HCT: 39.8 % (ref 39.0–52.0)
Hemoglobin: 13 g/dL (ref 13.0–17.0)
Immature Granulocytes: 0 %
Lymphocytes Relative: 12 %
Lymphs Abs: 0.8 10*3/uL (ref 0.7–4.0)
MCH: 29.3 pg (ref 26.0–34.0)
MCHC: 32.7 g/dL (ref 30.0–36.0)
MCV: 89.6 fL (ref 80.0–100.0)
Monocytes Absolute: 0.6 10*3/uL (ref 0.1–1.0)
Monocytes Relative: 8 %
Neutro Abs: 5.1 10*3/uL (ref 1.7–7.7)
Neutrophils Relative %: 75 %
Platelet Count: 312 10*3/uL (ref 150–400)
RBC: 4.44 MIL/uL (ref 4.22–5.81)
RDW: 13.2 % (ref 11.5–15.5)
WBC Count: 6.8 10*3/uL (ref 4.0–10.5)
nRBC: 0 % (ref 0.0–0.2)

## 2021-05-31 MED ORDER — CYANOCOBALAMIN 1000 MCG/ML IJ SOLN
INTRAMUSCULAR | Status: AC
  Filled 2021-05-31: qty 1

## 2021-05-31 MED ORDER — PROCHLORPERAZINE MALEATE 10 MG PO TABS: 10.0000 mg | ORAL_TABLET | Freq: Four times a day (QID) | ORAL | 0 refills | Status: DC | PRN

## 2021-05-31 MED ORDER — CYANOCOBALAMIN 1000 MCG/ML IJ SOLN
1000.0000 ug | Freq: Once | INTRAMUSCULAR | Status: AC
  Administered 2021-05-31: 1000 ug via INTRAMUSCULAR

## 2021-05-31 MED ORDER — FOLIC ACID 1 MG PO TABS: 1.0000 mg | ORAL_TABLET | Freq: Every day | ORAL | 4 refills | Status: DC

## 2021-05-31 NOTE — Progress Notes (Signed)
START ON PATHWAY REGIMEN - Non-Small Cell Lung     A cycle is every 21 days:     Pemetrexed      Carboplatin      Bevacizumab-xxxx   **Always confirm dose/schedule in your pharmacy ordering system**  Patient Characteristics: Stage IV Metastatic, Nonsquamous, Awaiting Molecular Test Results and Need to Start Chemotherapy, PS = 0, 1 Therapeutic Status: Stage IV Metastatic Histology: Nonsquamous Cell Broad Molecular Profiling Status: Awaiting Molecular Test Results and Need to Start Chemotherapy ECOG Performance Status: 1 Intent of Therapy: Non-Curative / Palliative Intent, Discussed with Patient

## 2021-05-31 NOTE — Progress Notes (Signed)
[place] [phone]   [phone]  OFFICE PROGRESS NOTE  [name], MD [place] [address]  DIAGNOSIS:  Stage IV (T2b, N2, M1 B) non-small cell lung cancer, adenocarcinoma presented with large central left lower lobe lung mass with left hilar and mediastinal lymphadenopathy as well as abdominal retroperitoneal lymphadenopathy diagnosed in May 2022.  The patient also has bilateral parotid gland nodules that need close monitoring.  PRIOR THERAPY: Palliative radiotherapy to the large central left lower lobe lung mass under the care of Dr. Lisbeth Renshaw.  CURRENT THERAPY: Systemic chemotherapy with carboplatin for AUC of 5, Alimta 500 Mg/M2 and possibly Keytruda 200 Mg IV every 3 weeks if the molecular studies are negative for actionable mutation.  First dose expected June 14, 2021.  INTERVAL HISTORY: Cameron Harper 45 y.o. male returns to the clinic today for follow-up visit accompanied by his wife.  The patient is feeling fine today with no concerning complaints except for the chronic back pain.  He is currently undergoing palliative radiotherapy to the large central left lower lobe lung mass and left hilar mediastinal adenopathy.  He is tolerating his radiotherapy fairly well.  He denied having any current chest pain but has shortness of breath with exertion with mild cough and no hemoptysis.  He denied having any weight loss or night sweats.  He has no nausea, vomiting, diarrhea or constipation.  He is scheduled to have MRI of the brain in 2 days.  The patient also had tissue blocks and to [facility] for molecular studies but the final report is not available yet.  He is here for evaluation and discussion of his treatment options.  MEDICAL HISTORY: Past Medical History:  Diagnosis Date   Chronic back pain    Eczema    GERD (gastroesophageal reflux disease)    Lung cancer (HCC)     ALLERGIES:  is allergic to  penicillins.  MEDICATIONS:  Current Outpatient Medications  Medication Sig Dispense Refill   acetaminophen (TYLENOL) 500 MG tablet Take 500-1,000 mg by mouth every 6 (six) hours as needed (for back pain.).     famotidine (PEPCID) 20 MG tablet One after supper 30 tablet 11   ibuprofen (ADVIL) 200 MG tablet Take 400-600 mg by mouth every 8 (eight) hours as needed (for pain.).     nicotine (NICODERM CQ - DOSED IN MG/24 HOURS) 21 mg/24hr patch Place 21 mg onto the skin daily.     pantoprazole (PROTONIX) 40 MG tablet Take 1 tablet (40 mg total) by mouth daily. Take 30-60 min before first meal of the day 30 tablet 2   No current facility-administered medications for this visit.    SURGICAL HISTORY:  Past Surgical History:  Procedure Laterality Date   BRONCHIAL BRUSHINGS  05/12/2021   Procedure: BRONCHIAL BRUSHINGS;  Surgeon: Garner Nash, DO;  Location: [place] [facility];  Service: Pulmonary;;   BRONCHIAL DILITATION  05/12/2021   Procedure: BRONCHIAL DILITATION;  Surgeon: Garner Nash, DO;  Location: [place] [facility];  Service: Pulmonary;;   BRONCHIAL NEEDLE ASPIRATION BIOPSY  05/12/2021   Procedure: BRONCHIAL NEEDLE ASPIRATION BIOPSIES;  Surgeon: Garner Nash, DO;  Location: [place] [facility];  Service: Pulmonary;;   BRONCHIAL WASHINGS  05/12/2021   Procedure: BRONCHIAL WASHINGS;  Surgeon: Garner Nash, DO;  Location: [place];  Service: Pulmonary;;   CRYOTHERAPY  05/12/2021   Procedure: CRYOTHERAPY;  Surgeon: Garner Nash, DO;  Location: [place] [facility]COPY;  Service: Pulmonary;;   HEMOSTASIS  CONTROL  05/12/2021   Procedure: HEMOSTASIS CONTROL;  Surgeon: Garner Nash, DO;  Location: [place] [facility];  Service: Pulmonary;;  epi injection   Spine injection     Pain control   VIDEO BRONCHOSCOPY WITH ENDOBRONCHIAL ULTRASOUND N/A 05/12/2021   Procedure: VIDEO BRONCHOSCOPY WITH ENDOBRONCHIAL ULTRASOUND;  Surgeon: Garner Nash, DO;  Location: [place];  Service: Pulmonary;  Laterality: N/A;    WISDOM TOOTH EXTRACTION     no anesthesia involed    REVIEW OF SYSTEMS:  Constitutional: positive for fatigue Eyes: negative Ears, nose, mouth, throat, and face: negative Respiratory: positive for dyspnea on exertion Cardiovascular: negative Gastrointestinal: negative Genitourinary:negative Integument/breast: negative Hematologic/lymphatic: negative Musculoskeletal:positive for back pain Neurological: negative Behavioral/Psych: negative Endocrine: negative Allergic/Immunologic: negative   PHYSICAL EXAMINATION: General appearance: alert, cooperative, fatigued, and no distress Head: Normocephalic, without obvious abnormality, atraumatic Neck: no adenopathy, no JVD, supple, symmetrical, trachea midline, and thyroid not enlarged, symmetric, no tenderness/mass/nodules Lymph nodes: Cervical, supraclavicular, and axillary nodes normal. Resp: clear to auscultation bilaterally Back: symmetric, no curvature. ROM normal. No CVA tenderness. Cardio: regular rate and rhythm, S1, S2 normal, no murmur, click, rub or gallop GI: soft, non-tender; bowel sounds normal; no masses,  no organomegaly Extremities: extremities normal, atraumatic, no cyanosis or edema Neurologic: Alert and oriented X 3, normal strength and tone. Normal symmetric reflexes. Normal coordination and gait  ECOG PERFORMANCE STATUS: 1 - Symptomatic but completely ambulatory  Blood pressure 133/63, pulse 84, temperature 98.6 F (37 C), temperature source Tympanic, resp. rate 20, height 5\' 9"  (1.753 m), weight 168 lb 3.2 oz (76.3 kg), SpO2 99 %.  LABORATORY DATA: Lab Results  Component Value Date   WBC 6.8 05/31/2021   HGB 13.0 05/31/2021   HCT 39.8 05/31/2021   MCV 89.6 05/31/2021   PLT 312 05/31/2021      Chemistry   No results found for: NA, K, CL, CO2, BUN, CREATININE, GLU No results found for: CALCIUM, ALKPHOS, AST, ALT, BILITOT     RADIOGRAPHIC STUDIES: NM PET Image Initial (PI) Skull Base To Thigh  Result  Date: 05/02/2021 CLINICAL DATA:  Initial treatment strategy for mediastinal mass. EXAM: NUCLEAR MEDICINE PET SKULL BASE TO THIGH TECHNIQUE: 8.5 mCi F-18 FDG was injected intravenously. Full-ring PET imaging was performed from the skull base to thigh after the radiotracer. CT data was obtained and used for attenuation correction and anatomic localization. Fasting blood glucose: 98 mg/dl COMPARISON:  None. FINDINGS: Mediastinal blood-pool activity (background): SUV max = 2.2 Liver activity (reference): SUV max = N/A NECK: Several sub-cm parotid lymph nodes are seen bilaterally which show hypermetabolic activity, greatest on the left with SUV max of 6.5, and on the right with SUV max of 4.1. Incidental CT findings:  None. CHEST: Hypermetabolic mass or lymphadenopathy is seen involving the left hilum and central left lower lobe, which measures 4.9 x 3.7 cm on image 77/4 and has SUV max of 11.7. Hypermetabolic mediastinal lymphadenopathy is seen in the lateral aortic region and AP window, with index area in the AP window measuring 2.3 x 2.1 cm on image 70/4, with SUV max of 11.3. A 1 cm hypermetabolic lymph node is also seen in the lower paraesophageal region, with SUV max of 5.9. Incidental CT findings:  None. ABDOMEN/PELVIS: No abnormal hypermetabolic activity within the liver, pancreas, adrenal glands, or spleen. Hypermetabolic lymphadenopathy is seen in the abdominal retroperitoneum in the aortocaval space, with largest lymph node measuring 2.3 x 2.1 cm on image 121/4, with SUV max of 8.6. No hypermetabolic  masses or lymphadenopathy identified within the pelvis. Incidental CT findings:  None. SKELETON: No focal hypermetabolic bone lesions to suggest skeletal metastasis. Incidental CT findings:  None. IMPRESSION: Hypermetabolic mass or lymphadenopathy involving the left hilum and central left lower lobe. Hypermetabolic lymphadenopathy also seen within the mediastinum, bilateral parotid glands, and abdominal  retroperitoneum. Differential diagnosis includes lung carcinoma with metastatic disease and lymphoma. Electronically Signed   By: Marlaine Hind M.D.   On: 05/02/2021 15:49   DG Chest Port 1 View  Result Date: 05/12/2021 CLINICAL DATA:  Post via bronchoscopy with endobronchial ultrasound for mediastinal lymph node in LEFT lower lobe nodule. EXAM: PORTABLE CHEST 1 VIEW COMPARISON:  Apr 18, 2021. FINDINGS: EKG leads project over the chest. Abnormal LEFT mediastinal contours are similar to the prior exam. Heart size is stable. Minimal haziness at the periphery of the LEFT chest may relate to recent biopsy. No lobar consolidation. No visible pneumothorax. On limited assessment no acute skeletal process. IMPRESSION: 1. Minimal haziness at the periphery of the LEFT chest may reflect changes of recent biopsy. 2. No visible pneumothorax. Electronically Signed   By: Zetta Bills M.D.   On: 05/12/2021 09:54    ASSESSMENT AND PLAN: This is a very pleasant 45 years old white male recently diagnosed with a stage IV (T2b, N2, M1 B) non-small cell lung cancer, adenocarcinoma presented with large central left lower lobe lung mass in addition to left hilar and mediastinal lymphadenopathy as well as retroperitoneal abdominal lymph node diagnosed in May 2022.  The patient is currently undergoing palliative radiotherapy to the large left lower lobe lung mass under the care of of Dr. Lisbeth Renshaw.  He is expected to complete this course of treatment on June 12, 2021. He is scheduled to have MRI of the brain in 2 days. The patient has molecular studies by [facility] but still pending. I had a lengthy discussion with the patient and his wife today about his current condition and treatment options. The patient understand that he has incurable condition and all the treatment will be of palliative nature. I am still waiting on the final molecular studies to finalize his treatment plan.  I will tentatively arrange for the patient to start  systemic chemotherapy with carboplatin for AUC of 5, Alimta 500 Mg/M2 and Keytruda 200 Mg IV every 3 weeks on June 14, 2021 if the molecular studies are negative. If the final molecular studies showed evidence for actionable mutation, we will cancel the chemotherapy and started the patient on the targeted therapy. The patient will receive vitamin B12 injection today and I will call his pharmacy with prescription for Compazine 10 mg p.o. every 6 hours as needed for nausea in addition to folic acid 1 mg p.o. daily. I will see the patient back for follow-up visit on the first day of the start of his treatment. He will have a chemotherapy education class before the first dose of his treatment. For the back pain, he has no evidence of osseous metastasis to this area but he has degenerative disc disease with bulging disc in the lumbar spine.  He will continue with his current medication with Tylenol and I also advised him to use lidocaine patch if needed.  He has been seen in the past by orthopedic surgery. The patient was advised to call immediately if he has any other concerning symptoms in the interval. The patient voices understanding of current disease status and treatment options and is in agreement with the current care plan.  All  questions were answered. The patient knows to call the clinic with any problems, questions or concerns. We can certainly see the patient much sooner if necessary. The total time spent in the appointment was 35 minutes.  Disclaimer: This note was dictated with voice recognition software. Similar sounding words can inadvertently be transcribed and may not be corrected upon review.

## 2021-06-01 ENCOUNTER — Other Ambulatory Visit: Payer: Self-pay

## 2021-06-01 ENCOUNTER — Ambulatory Visit
Admission: RE | Admit: 2021-06-01 | Discharge: 2021-06-01 | Disposition: A | Discharge status: Home / Self Care | Payer: Managed Care, Other (non HMO) | Source: Ambulatory Visit | Attending: Radiation Oncology | Admitting: Radiation Oncology

## 2021-06-01 DIAGNOSIS — Z510 Encounter for antineoplastic radiation therapy: Secondary | ICD-10-CM | POA: Diagnosis not present

## 2021-06-02 ENCOUNTER — Ambulatory Visit (HOSPITAL_COMMUNITY)
Admission: RE | Admit: 2021-06-02 | Discharge: 2021-06-02 | Disposition: A | Discharge status: Home / Self Care | Payer: Managed Care, Other (non HMO) | Source: Ambulatory Visit | Attending: Radiation Oncology | Admitting: Radiation Oncology

## 2021-06-02 ENCOUNTER — Ambulatory Visit
Admission: RE | Admit: 2021-06-02 | Discharge: 2021-06-02 | Disposition: A | Discharge status: Home / Self Care | Payer: Managed Care, Other (non HMO) | Source: Ambulatory Visit | Attending: Radiation Oncology | Admitting: Radiation Oncology

## 2021-06-02 ENCOUNTER — Telehealth: Payer: Self-pay | Admitting: Internal Medicine

## 2021-06-02 ENCOUNTER — Encounter: Payer: Self-pay | Admitting: *Deleted

## 2021-06-02 ENCOUNTER — Inpatient Hospital Stay: Payer: Managed Care, Other (non HMO) | Attending: Internal Medicine | Admitting: Dietician

## 2021-06-02 DIAGNOSIS — Z79899 Other long term (current) drug therapy: Secondary | ICD-10-CM | POA: Diagnosis not present

## 2021-06-02 DIAGNOSIS — C3432 Malignant neoplasm of lower lobe, left bronchus or lung: Secondary | ICD-10-CM | POA: Insufficient documentation

## 2021-06-02 DIAGNOSIS — Z5112 Encounter for antineoplastic immunotherapy: Secondary | ICD-10-CM | POA: Diagnosis present

## 2021-06-02 DIAGNOSIS — Z510 Encounter for antineoplastic radiation therapy: Secondary | ICD-10-CM | POA: Insufficient documentation

## 2021-06-02 DIAGNOSIS — Z87891 Personal history of nicotine dependence: Secondary | ICD-10-CM | POA: Diagnosis not present

## 2021-06-02 DIAGNOSIS — M5136 Other intervertebral disc degeneration, lumbar region: Secondary | ICD-10-CM | POA: Insufficient documentation

## 2021-06-02 DIAGNOSIS — Z5111 Encounter for antineoplastic chemotherapy: Secondary | ICD-10-CM | POA: Diagnosis present

## 2021-06-02 MED ORDER — GADOBUTROL 1 MMOL/ML IV SOLN
7.5000 mL | Freq: Once | INTRAVENOUS | Status: AC | PRN
  Administered 2021-06-02: 7.5 mL via INTRAVENOUS

## 2021-06-02 NOTE — Progress Notes (Signed)
Nutrition Assessment   Reason for Assessment: Patient request   ASSESSMENT: 45 year old male with newly diagnosed stage IV lung cancer. He is receiving palliative radiation to right lower lobe. Plans to begin systemic chemotherapy, expected July 13 pending molecular study report.  Met with patient and wife in clinic. Patient reports tolerating radiation therapy. He denies nutrition impact symptoms. Patient reports decrease in appetite after diagnosis, but has gotten "much better" recently. He reports chronic lower back pain which has worsened over the past month. Patient reports he is not as active, spending more time sitting in his recliner at home. Patient and wife have a strong support system through their church, says his pastor brought him to a radiation appointment last week. They have received various information regarding dietary and cancer treatment options from church community and Internet. Patient has been informed not to have any foods with sugar due to it feeding cancer. Patient and wife asking what foods he should and should not be eating.    Medications: Folic acid, Compazine, Pepcid, Protonix   Labs: Glucose 125   Anthropometrics: Patient is 5 lbs (2.9%) under reported usual weight. This is insignificant for time frame.   Height: 5'9" Weight: 168 lb 3.2 oz UBW: 173 lb (per pt in August 2021) BMI: 24.84   NUTRITION DIAGNOSIS: Food and nutrition related knowledge deficit related to newly diagnosed lung cancer as evidenced by requested nutrition recommendations     INTERVENTION:  Educated on the importance of adequate intake of calorie and protein energy to maintain weights, strength, and nutrition Discussed evidence-based nutrition recommendations and answered questions regarding sugar, fact sheet given Provided aicr.org resource  Encouraged small frequent meals and snacks with focus on protein High calorie, high protein snack ideas given Smoothie recipes  given Recommended drinking 1 Ensure Plus/equivalent for added calories/protein Ensure coupons given Encouraged increased activity as able to preserve lean muscle mass Contact information provided, encouraged to contact with additional questions/concerns   MONITORING, EVALUATION, GOAL: Patient will tolerate adequate calories and protein to minimize weight loss   Next Visit: To be determined with treatment plan

## 2021-06-02 NOTE — Progress Notes (Signed)
I received an update from [facility] that Cameron Harper results will be completed by next week. Dr. Julien Nordmann is aware.

## 2021-06-02 NOTE — Telephone Encounter (Signed)
Scheduled per los. Called and spoke with patient. Confirmed appts  

## 2021-06-03 ENCOUNTER — Encounter: Payer: Self-pay | Admitting: Internal Medicine

## 2021-06-06 ENCOUNTER — Ambulatory Visit
Admission: RE | Admit: 2021-06-06 | Discharge: 2021-06-06 | Disposition: A | Discharge status: Home / Self Care | Payer: Managed Care, Other (non HMO) | Source: Ambulatory Visit | Attending: Radiation Oncology | Admitting: Radiation Oncology

## 2021-06-06 ENCOUNTER — Other Ambulatory Visit: Payer: Self-pay

## 2021-06-06 ENCOUNTER — Telehealth: Payer: Self-pay | Admitting: Medical Oncology

## 2021-06-06 DIAGNOSIS — Z5112 Encounter for antineoplastic immunotherapy: Secondary | ICD-10-CM | POA: Diagnosis not present

## 2021-06-06 NOTE — Telephone Encounter (Signed)
err

## 2021-06-07 ENCOUNTER — Ambulatory Visit
Admission: RE | Admit: 2021-06-07 | Discharge: 2021-06-07 | Disposition: A | Discharge status: Home / Self Care | Payer: Managed Care, Other (non HMO) | Source: Ambulatory Visit | Attending: Radiation Oncology | Admitting: Radiation Oncology

## 2021-06-07 ENCOUNTER — Other Ambulatory Visit: Payer: Self-pay | Admitting: Internal Medicine

## 2021-06-07 DIAGNOSIS — Z5112 Encounter for antineoplastic immunotherapy: Secondary | ICD-10-CM | POA: Diagnosis not present

## 2021-06-07 DIAGNOSIS — R053 Chronic cough: Secondary | ICD-10-CM

## 2021-06-08 ENCOUNTER — Other Ambulatory Visit: Payer: Self-pay

## 2021-06-08 ENCOUNTER — Other Ambulatory Visit: Payer: Self-pay | Admitting: Internal Medicine

## 2021-06-08 ENCOUNTER — Ambulatory Visit
Admission: RE | Admit: 2021-06-08 | Discharge: 2021-06-08 | Disposition: A | Discharge status: Home / Self Care | Payer: Managed Care, Other (non HMO) | Source: Ambulatory Visit | Attending: Radiation Oncology | Admitting: Radiation Oncology

## 2021-06-08 ENCOUNTER — Telehealth: Payer: Self-pay | Admitting: *Deleted

## 2021-06-08 DIAGNOSIS — Z5112 Encounter for antineoplastic immunotherapy: Secondary | ICD-10-CM | POA: Diagnosis not present

## 2021-06-08 NOTE — Progress Notes (Signed)
DISCONTINUE ON PATHWAY REGIMEN - Non-Small Cell Lung     A cycle is every 21 days:     Pemetrexed      Carboplatin      Bevacizumab-xxxx   **Always confirm dose/schedule in your pharmacy ordering system**  REASON: Change In Patient Status PRIOR TREATMENT: [id number]: Carboplatin AUC=5 + Pemetrexed 500 mg/m2 + Bevacizumab 15 mg/kg q21 Days x 1 Cycle  START ON PATHWAY REGIMEN - Non-Small Cell Lung     A cycle is every 21 days:     Pembrolizumab      Pemetrexed      Carboplatin   **Always confirm dose/schedule in your pharmacy ordering system**  Patient Characteristics: Stage IV Metastatic, Nonsquamous, Molecular Analysis Completed, Molecular Alteration Present and Targeted Therapy Exhausted OR EGFR Exon 20+ or KRAS G12C+ Present and No Prior Chemo/Immunotherapy OR No Alteration Present, Initial  Chemotherapy/Immunotherapy, PS = 0, 1, No Alteration Present, No Alteration Present, Candidate for Immunotherapy, PD-L1 Expression Positive  ? 50% (TPS) and Immunotherapy Candidate Therapeutic Status: Stage IV Metastatic Histology: Nonsquamous Cell Broad Molecular Profiling Status: Engineer, manufacturing Analysis Results: No Alteration Present ECOG Performance Status: 1 Chemotherapy/Immunotherapy Line of Therapy: Initial Chemotherapy/Immunotherapy EGFR Exons 18-21 Mutation Testing Status: Completed and Negative ALK Fusion/Rearrangement Testing Status: Completed and Negative BRAF V600 Mutation Testing Status: Completed and Negative KRAS G12C Mutation Testing Status: Completed and Negative MET Exon 14 Mutation Testing Status: Completed and Negative RET Fusion/Rearrangement Testing Status: Completed and Negative NTRK Fusion/Rearrangement Testing Status: Completed and Negative ROS1 Fusion/Rearrangement Testing Status: Completed and Negative Immunotherapy Candidate Status: Candidate for Immunotherapy PD-L1 Expression Status: PD-L1 Positive ? 50% (TPS) Intent of  Therapy: Non-Curative / Palliative Intent, Discussed with Patient

## 2021-06-08 NOTE — Telephone Encounter (Signed)
Notified molecular testing was negative. Will proceed with chemo and immunotherapy.

## 2021-06-08 NOTE — Telephone Encounter (Signed)
Cameron Harper is wanting to know what type of treatment he will be taking. Dr Julien Nordmann was waiting for additional testing. Please advise

## 2021-06-09 ENCOUNTER — Ambulatory Visit
Admission: RE | Admit: 2021-06-09 | Discharge: 2021-06-09 | Disposition: A | Discharge status: Home / Self Care | Payer: Managed Care, Other (non HMO) | Source: Ambulatory Visit | Attending: Radiation Oncology | Admitting: Radiation Oncology

## 2021-06-09 DIAGNOSIS — Z5112 Encounter for antineoplastic immunotherapy: Secondary | ICD-10-CM | POA: Diagnosis not present

## 2021-06-09 MED FILL — Dexamethasone Sodium Phosphate Inj 100 MG/10ML: INTRAMUSCULAR | Qty: 1 | Status: AC

## 2021-06-09 MED FILL — Fosaprepitant Dimeglumine For IV Infusion 150 MG (Base Eq): INTRAVENOUS | Qty: 5 | Status: AC

## 2021-06-12 ENCOUNTER — Other Ambulatory Visit: Payer: Self-pay

## 2021-06-12 ENCOUNTER — Ambulatory Visit
Admission: RE | Admit: 2021-06-12 | Discharge: 2021-06-12 | Disposition: A | Discharge status: Home / Self Care | Payer: Managed Care, Other (non HMO) | Source: Ambulatory Visit | Attending: Radiation Oncology | Admitting: Radiation Oncology

## 2021-06-12 ENCOUNTER — Telehealth: Payer: Self-pay | Admitting: Radiation Oncology

## 2021-06-12 ENCOUNTER — Encounter: Payer: Self-pay | Admitting: Radiation Oncology

## 2021-06-12 ENCOUNTER — Inpatient Hospital Stay: Payer: Managed Care, Other (non HMO)

## 2021-06-12 DIAGNOSIS — Z5112 Encounter for antineoplastic immunotherapy: Secondary | ICD-10-CM | POA: Diagnosis not present

## 2021-06-12 NOTE — Telephone Encounter (Signed)
I called pt to confirm his MRI results which he was aware of through [place]. He just finished palliative XRT to this lung today. We will follow up by phone in a few weeks to see how he's doing as well as he continues with Dr. Julien Nordmann.

## 2021-06-13 ENCOUNTER — Other Ambulatory Visit: Payer: Self-pay | Admitting: Physician Assistant

## 2021-06-13 ENCOUNTER — Telehealth: Payer: Self-pay | Admitting: *Deleted

## 2021-06-13 DIAGNOSIS — C3432 Malignant neoplasm of lower lobe, left bronchus or lung: Secondary | ICD-10-CM

## 2021-06-13 NOTE — Telephone Encounter (Signed)
Pt called with a few questions.  He wanted to know if he could cont. Chantix with chemo starting tomorrow.  Informed that this should be OK.  He also asked about pain meds.  He has been taking advil/tylenol together for his back pain which is thought to not be related to his cancer.  He hasn't been able to reach his neurologist.  Education RN informed pt to not take ibuprofen since NSAID's can lower platelets along with chemo. He wants to know if there is something else he can take.  Referred to PCP or neurologist for pain med.  Message routed to Dr Mohamed/pod RN.  Pt expressed understanding.

## 2021-06-14 ENCOUNTER — Inpatient Hospital Stay: Payer: Managed Care, Other (non HMO) | Admitting: Internal Medicine

## 2021-06-14 ENCOUNTER — Inpatient Hospital Stay: Payer: Managed Care, Other (non HMO)

## 2021-06-14 ENCOUNTER — Other Ambulatory Visit: Payer: Self-pay

## 2021-06-14 VITALS — BP 105/68 | HR 73 | Temp 97.1°F | Resp 19 | Ht 69.0 in | Wt 165.9 lb

## 2021-06-14 DIAGNOSIS — Z5112 Encounter for antineoplastic immunotherapy: Secondary | ICD-10-CM | POA: Diagnosis not present

## 2021-06-14 DIAGNOSIS — C3432 Malignant neoplasm of lower lobe, left bronchus or lung: Secondary | ICD-10-CM

## 2021-06-14 LAB — CBC WITH DIFFERENTIAL (CANCER CENTER ONLY)
Abs Immature Granulocytes: 0.02 10*3/uL (ref 0.00–0.07)
Basophils Absolute: 0.1 10*3/uL (ref 0.0–0.1)
Basophils Relative: 1 %
Eosinophils Absolute: 0.4 10*3/uL (ref 0.0–0.5)
Eosinophils Relative: 6 %
HCT: 40.8 % (ref 39.0–52.0)
Hemoglobin: 13.2 g/dL (ref 13.0–17.0)
Immature Granulocytes: 0 %
Lymphocytes Relative: 10 %
Lymphs Abs: 0.6 10*3/uL — ABNORMAL LOW (ref 0.7–4.0)
MCH: 29.3 pg (ref 26.0–34.0)
MCHC: 32.4 g/dL (ref 30.0–36.0)
MCV: 90.7 fL (ref 80.0–100.0)
Monocytes Absolute: 0.7 10*3/uL (ref 0.1–1.0)
Monocytes Relative: 12 %
Neutro Abs: 4.2 10*3/uL (ref 1.7–7.7)
Neutrophils Relative %: 71 %
Platelet Count: 258 10*3/uL (ref 150–400)
RBC: 4.5 MIL/uL (ref 4.22–5.81)
RDW: 13.5 % (ref 11.5–15.5)
WBC Count: 5.9 10*3/uL (ref 4.0–10.5)
nRBC: 0 % (ref 0.0–0.2)

## 2021-06-14 LAB — CMP (CANCER CENTER ONLY)
ALT: 15 U/L (ref 0–44)
AST: 10 U/L — ABNORMAL LOW (ref 15–41)
Albumin: 3.3 g/dL — ABNORMAL LOW (ref 3.5–5.0)
Alkaline Phosphatase: 85 U/L (ref 38–126)
Anion gap: 10 (ref 5–15)
BUN: 11 mg/dL (ref 6–20)
CO2: 24 mmol/L (ref 22–32)
Calcium: 9.4 mg/dL (ref 8.9–10.3)
Chloride: 109 mmol/L (ref 98–111)
Creatinine: 0.78 mg/dL (ref 0.61–1.24)
GFR, Estimated: >60 mL/min (ref >60)
Glucose, Bld: 92 mg/dL (ref 70–99)
Potassium: 4.1 mmol/L (ref 3.5–5.1)
Sodium: 143 mmol/L (ref 135–145)
Total Bilirubin: 0.3 mg/dL (ref 0.3–1.2)
Total Protein: 7.3 g/dL (ref 6.5–8.1)

## 2021-06-14 LAB — TSH: TSH: 1.576 u[IU]/mL (ref 0.320–4.118)

## 2021-06-14 MED ORDER — PALONOSETRON HCL INJECTION 0.25 MG/5ML
INTRAVENOUS | Status: AC
  Filled 2021-06-14: qty 5

## 2021-06-14 MED ORDER — SODIUM CHLORIDE 0.9 % IV SOLN
500.0000 mg/m2 | Freq: Once | INTRAVENOUS | Status: AC
  Administered 2021-06-14: 1000 mg via INTRAVENOUS
  Filled 2021-06-14: qty 40

## 2021-06-14 MED ORDER — OXYCODONE HCL 5 MG PO TABS
ORAL_TABLET | ORAL | Status: AC
  Filled 2021-06-14: qty 1

## 2021-06-14 MED ORDER — PALONOSETRON HCL INJECTION 0.25 MG/5ML
0.2500 mg | Freq: Once | INTRAVENOUS | Status: AC
  Administered 2021-06-14: 0.25 mg via INTRAVENOUS

## 2021-06-14 MED ORDER — ACETAMINOPHEN 325 MG PO TABS
325.0000 mg | ORAL_TABLET | Freq: Once | ORAL | Status: AC
Start: 2021-06-14 — End: 2021-06-14
  Administered 2021-06-14: 325 mg via ORAL

## 2021-06-14 MED ORDER — SODIUM CHLORIDE 0.9 % IV SOLN
200.0000 mg | Freq: Once | INTRAVENOUS | Status: AC
  Administered 2021-06-14: 200 mg via INTRAVENOUS
  Filled 2021-06-14: qty 8

## 2021-06-14 MED ORDER — SODIUM CHLORIDE 0.9 % IV SOLN
150.0000 mg | Freq: Once | INTRAVENOUS | Status: AC
  Administered 2021-06-14: 150 mg via INTRAVENOUS
  Filled 2021-06-14: qty 150

## 2021-06-14 MED ORDER — SODIUM CHLORIDE 0.9 % IV SOLN
741.0000 mg | Freq: Once | INTRAVENOUS | Status: AC
  Administered 2021-06-14: 740 mg via INTRAVENOUS
  Filled 2021-06-14: qty 74

## 2021-06-14 MED ORDER — ACETAMINOPHEN 325 MG PO TABS
ORAL_TABLET | ORAL | Status: AC
  Filled 2021-06-14: qty 1

## 2021-06-14 MED ORDER — SODIUM CHLORIDE 0.9 % IV SOLN
Freq: Once | INTRAVENOUS | Status: AC
  Filled 2021-06-14: qty 250

## 2021-06-14 MED ORDER — SODIUM CHLORIDE 0.9 % IV SOLN
10.0000 mg | Freq: Once | INTRAVENOUS | Status: AC
  Administered 2021-06-14: 10 mg via INTRAVENOUS
  Filled 2021-06-14: qty 10

## 2021-06-14 MED ORDER — OXYCODONE HCL 5 MG PO TABS
5.0000 mg | ORAL_TABLET | Freq: Once | ORAL | Status: AC
  Administered 2021-06-14: 5 mg via ORAL

## 2021-06-14 NOTE — Progress Notes (Signed)
[facility] Telephone:([phone]) [phone]   [phone]  OFFICE PROGRESS NOTE  [name], MD [place] [address]  DIAGNOSIS:  Stage IV (T2b, N2, M1 B) non-small cell lung cancer, adenocarcinoma presented with large central left lower lobe lung mass with left hilar and mediastinal lymphadenopathy as well as abdominal retroperitoneal lymphadenopathy diagnosed in May 2022.  The patient also has bilateral parotid gland nodules that need close monitoring.  [facility] MOLECULAR STUDY: Results with Therapy Associations BIOMARKER METHOD ANALYTE RESULT THERAPY ASSOCIATION BIOMARKER LEVEL* .PD-L1 (22c3) IHC Protein Positive, TPS: 50% BENEFIT cemiplimab, pembrolizumab Level 1 .PD-L1 (28-8) IHC Protein Positive  1+, 50% BENEFIT nivolumab/ipilimumab combination Level 1 .TMB Seq DNA-Tumor High, 18 mut/Mb BENEFIT pembrolizumab Level 2 . alectinib, ceritinib, crizotinib, lorlatinib Level 1 . IHC Protein Negative  0 brigatinib Level 2 . ALK Seq RNA-Tumor Fusion Not Detected LACK OF BENEFIT alectinib, brigatinib, ceritinib, crizotinib, lorlatinib Level 2 .BRAF Seq DNA-Tumor Mutation Not Detected LACK OF BENEFIT dabrafenib and trametinib combination therapy, vemurafenib Level 2 .EGFR Seq DNA-Tumor Mutation Not Detected LACK OF BENEFIT erlotinib, gefitinib Level 2 .KRAS Seq DNA-Tumor Mutation Not Detected LACK OF BENEFIT sotorasib Level 2 .RET Seq RNA-Tumor Fusion Not Detected LACK OF BENEFIT pralsetinib, selpercatinib Level 2 .ROS1 Seq RNA-Tumor Fusion Not Detected LACK OF BENEFIT ceritinib, crizotinib, entrectinib, lorlatinib Level 2 . CNA-Seq DNA-Tumor Amplification Not Detected . MET Seq RNA-Tumor Variant Transcript Not Detected LACK OF BENEFIT crizotinib Level 3  PRIOR THERAPY: Palliative radiotherapy to the large central left lower lobe lung mass under the care of Dr. Lisbeth Renshaw.  CURRENT THERAPY: Systemic chemotherapy with carboplatin  for AUC of 5, Alimta 500 Mg/M2 and possibly Keytruda 200 Mg IV every 3 weeks.  First dose expected June 14, 2021.  INTERVAL HISTORY: Cameron Harper 45 y.o. male returns to the clinic today for follow-up visit.  The patient is feeling fine today with no concerning complaints except for the chronic back pain he has been using Tylenol and ibuprofen as needed basis.  He has an appointment with neurosurgeon for evaluation and consideration of steroid injection for his chronic back pain.  He denied having any current chest pain, shortness of breath, cough or hemoptysis.  He denied having any fever or chills.  He has no nausea, vomiting, diarrhea or constipation but has abdominal bloating.  The patient had molecular studies by [facility] and unfortunately it showed no actionable mutation but his PD-L1 expression is 50%.  The patient is here today for evaluation before starting the first cycle of his systemic chemotherapy.   MEDICAL HISTORY: Past Medical History:  Diagnosis Date   Chronic back pain    Eczema    GERD (gastroesophageal reflux disease)    Lung cancer (HCC)     ALLERGIES:  is allergic to penicillins.  MEDICATIONS:  Current Outpatient Medications  Medication Sig Dispense Refill   acetaminophen (TYLENOL) 500 MG tablet Take 500-1,000 mg by mouth every 6 (six) hours as needed (for back pain.).     famotidine (PEPCID) 20 MG tablet One after supper 30 tablet 11   folic acid (FOLVITE) 1 MG tablet Take 1 tablet (1 mg total) by mouth daily. 30 tablet 4   ibuprofen (ADVIL) 200 MG tablet Take 400-600 mg by mouth every 8 (eight) hours as needed (for pain.).     nicotine (NICODERM CQ - DOSED IN MG/24 HOURS) 21 mg/24hr patch Place 21 mg onto the skin daily.     pantoprazole (PROTONIX) 40 MG  tablet TAKE 1 TABLET BY MOUTH 30-60 MINS BEFORE FIRST MEAL ONCE A DAY. 30 tablet 2   prochlorperazine (COMPAZINE) 10 MG tablet Take 1 tablet (10 mg total) by mouth every 6 (six) hours as needed for nausea or  vomiting. 30 tablet 0   No current facility-administered medications for this visit.    SURGICAL HISTORY:  Past Surgical History:  Procedure Laterality Date   BRONCHIAL BRUSHINGS  05/12/2021   Procedure: BRONCHIAL BRUSHINGS;  Surgeon: Garner Nash, DO;  Location: [facility];  Service: Pulmonary;;   BRONCHIAL DILITATION  05/12/2021   Procedure: BRONCHIAL DILITATION;  Surgeon: Garner Nash, DO;  Location: [facility];  Service: Pulmonary;;   BRONCHIAL NEEDLE ASPIRATION BIOPSY  05/12/2021   Procedure: BRONCHIAL NEEDLE ASPIRATION BIOPSIES;  Surgeon: Garner Nash, DO;  Location: [facility];  Service: Pulmonary;;   BRONCHIAL WASHINGS  05/12/2021   Procedure: BRONCHIAL WASHINGS;  Surgeon: Garner Nash, DO;  Location: [facility];  Service: Pulmonary;;   CRYOTHERAPY  05/12/2021   Procedure: CRYOTHERAPY;  Surgeon: Garner Nash, DO;  Location: [facility];  Service: Pulmonary;;   HEMOSTASIS CONTROL  05/12/2021   Procedure: HEMOSTASIS CONTROL;  Surgeon: Garner Nash, DO;  Location: [facility];  Service: Pulmonary;;  epi injection   Spine injection     Pain control   VIDEO BRONCHOSCOPY WITH ENDOBRONCHIAL ULTRASOUND N/A 05/12/2021   Procedure: VIDEO BRONCHOSCOPY WITH ENDOBRONCHIAL ULTRASOUND;  Surgeon: Garner Nash, DO;  Location: [facility];  Service: Pulmonary;  Laterality: N/A;   WISDOM TOOTH EXTRACTION     no anesthesia involed    REVIEW OF SYSTEMS:  Constitutional: negative Eyes: negative Ears, nose, mouth, throat, and face: negative Respiratory: positive for dyspnea on exertion Cardiovascular: negative Gastrointestinal: negative Genitourinary:negative Integument/breast: negative Hematologic/lymphatic: negative Musculoskeletal:positive for back pain Neurological: negative Behavioral/Psych: negative Endocrine: negative Allergic/Immunologic: negative   PHYSICAL EXAMINATION: General appearance: alert, cooperative, fatigued, and no distress Head:  Normocephalic, without obvious abnormality, atraumatic Neck: no adenopathy, no JVD, supple, symmetrical, trachea midline, and thyroid not enlarged, symmetric, no tenderness/mass/nodules Lymph nodes: Cervical, supraclavicular, and axillary nodes normal. Resp: clear to auscultation bilaterally Back: symmetric, no curvature. ROM normal. No CVA tenderness. Cardio: regular rate and rhythm, S1, S2 normal, no murmur, click, rub or gallop GI: soft, non-tender; bowel sounds normal; no masses,  no organomegaly Extremities: extremities normal, atraumatic, no cyanosis or edema Neurologic: Alert and oriented X 3, normal strength and tone. Normal symmetric reflexes. Normal coordination and gait  ECOG PERFORMANCE STATUS: 1 - Symptomatic but completely ambulatory  Blood pressure 105/68, pulse 73, temperature (!) 97.1 F (36.2 C), temperature source Tympanic, resp. rate 19, height $RemoveBe'5\' 9"'[id number]$  (1.753 m), weight 165 lb 14.4 oz (75.3 kg), SpO2 98 %.  LABORATORY DATA: Lab Results  Component Value Date   WBC 5.9 06/14/2021   HGB 13.2 06/14/2021   HCT 40.8 06/14/2021   MCV 90.7 06/14/2021   PLT 258 06/14/2021      Chemistry      Component Value Date/Time   NA 144 05/31/2021 1435   K 3.5 05/31/2021 1435   CL 107 05/31/2021 1435   CO2 24 05/31/2021 1435   BUN 8 05/31/2021 1435   CREATININE 0.76 05/31/2021 1435      Component Value Date/Time   CALCIUM 9.4 05/31/2021 1435   ALKPHOS 98 05/31/2021 1435   AST 12 (L) 05/31/2021 1435   ALT 14 05/31/2021 1435   BILITOT 0.3 05/31/2021 1435       RADIOGRAPHIC STUDIES: MR Brain  W Wo Contrast  Result Date: 06/06/2021 CLINICAL DATA:  Non-small cell lung cancer staging. EXAM: MRI HEAD WITHOUT AND WITH CONTRAST TECHNIQUE: Multiplanar, multiecho pulse sequences of the brain and surrounding structures were obtained without and with intravenous contrast. CONTRAST:  7.22mL GADAVIST GADOBUTROL 1 MMOL/ML IV SOLN COMPARISON:  None. FINDINGS: Brain: No acute infarction,  hemorrhage, hydrocephalus, extra-axial collection or mass lesion. Vascular: Major arterial flow voids are maintained at the skull base. Skull and upper cervical spine: Normal marrow signal. Sinuses/Orbits: Moderate mucosal thickening of scattered ethmoid air cells. Mild mucosal thickening of bilateral frontal sinuses and maxillary sinuses with inferior right maxillary sinus retention cyst. Unremarkable orbits. Other: No mastoid effusions. IMPRESSION: No evidence of acute abnormality or metastatic disease. Electronically Signed   By: Margaretha Sheffield MD   On: 06/06/2021 07:01    ASSESSMENT AND PLAN: This is a very pleasant 45 years old white male recently diagnosed with a stage IV (T2b, N2, M1 B) non-small cell lung cancer, adenocarcinoma presented with large central left lower lobe lung mass in addition to left hilar and mediastinal lymphadenopathy as well as retroperitoneal abdominal lymph node diagnosed in May 2022.  The patient is currently undergoing palliative radiotherapy to the large left lower lobe lung mass under the care of of Dr. Lisbeth Renshaw.  He is expected to complete this course of treatment on June 12, 2021. MRI of the brain showed no evidence of metastatic disease to the brain. His molecular studies by [facility] showed no actionable mutations and PD-L1 expression of 50%. The patient is here today for evaluation before starting the first cycle of systemic chemotherapy with carboplatin for AUC of 5, Alimta 500 Mg/M2 and Keytruda 200 Mg IV every 3 weeks.  I recommended for the patient to proceed with his treatment today as planned. For the back pain, he has no evidence of osseous metastasis to this area but he has degenerative disc disease with bulging disc in the lumbar spine.  I will give him a dose of Percocet in the clinic today but he is scheduled to see a neurosurgeon for evaluation and management of his condition. I will see the patient back for follow-up visit in 1 week for evaluation and  management of any adverse effect of his treatment. The patient was advised to call immediately if he has any other concerning symptoms in the interval The patient voices understanding of current disease status and treatment options and is in agreement with the current care plan. All questions were answered. The patient knows to call the clinic with any problems, questions or concerns. We can certainly see the patient much sooner if necessary.  Disclaimer: This note was dictated with voice recognition software. Similar sounding words can inadvertently be transcribed and may not be corrected upon review.

## 2021-06-14 NOTE — Patient Instructions (Signed)
[facility]  Discharge Instructions: Thank you for choosing [facility] to provide your oncology and hematology care.   If you have a lab appointment with the [facility], please go directly to the [facility] and check in at the registration area.   Wear comfortable clothing and clothing appropriate for easy access to any Portacath or PICC line.   We strive to give you quality time with your provider. You may need to reschedule your appointment if you arrive late (15 or more minutes).  Arriving late affects you and other patients whose appointments are after yours.  Also, if you miss three or more appointments without notifying the office, you may be dismissed from the clinic at the provider's discretion.      For prescription refill requests, have your pharmacy contact our office and allow 72 hours for refills to be completed.    Today you received the following chemotherapy and/or immunotherapy agents: Keytruda/Alimta/Carboplatin.      To help prevent nausea and vomiting after your treatment, we encourage you to take your nausea medication as directed.  BELOW ARE SYMPTOMS THAT SHOULD BE REPORTED IMMEDIATELY: *FEVER GREATER THAN 100.4 F (38 C) OR HIGHER *CHILLS OR SWEATING *NAUSEA AND VOMITING THAT IS NOT CONTROLLED WITH YOUR NAUSEA MEDICATION *UNUSUAL SHORTNESS OF BREATH *UNUSUAL BRUISING OR BLEEDING *URINARY PROBLEMS (pain or burning when urinating, or frequent urination) *BOWEL PROBLEMS (unusual diarrhea, constipation, pain near the anus) TENDERNESS IN MOUTH AND THROAT WITH OR WITHOUT PRESENCE OF ULCERS (sore throat, sores in mouth, or a toothache) UNUSUAL RASH, SWELLING OR PAIN  UNUSUAL VAGINAL DISCHARGE OR ITCHING   Items with * indicate a potential emergency and should be followed up as soon as possible or go to the Emergency Department if any problems should occur.  Please show the CHEMOTHERAPY ALERT CARD or IMMUNOTHERAPY ALERT  CARD at check-in to the Emergency Department and triage nurse.  Should you have questions after your visit or need to cancel or reschedule your appointment, please contact [facility]  Dept: [phone]  and follow the prompts.  Office hours are 8:00 a.m. to 4:30 p.m. Monday - Friday. Please note that voicemails left after 4:00 p.m. may not be returned until the following business day.  We are closed weekends and major holidays. You have access to a nurse at all times for urgent questions. Please call the main number to the clinic Dept: [phone] and follow the prompts.   For any non-urgent questions, you may also contact your provider using MyChart. We now offer e-Visits for anyone 47 and older to request care online for non-urgent symptoms. For details visit [email or web address].   Also download the MyChart app! Go to the app store, search "MyChart", open the app, select [facility], and log in with your MyChart username and password.  Due to Covid, a mask is required upon entering the hospital/clinic. If you do not have a mask, one will be given to you upon arrival. For doctor visits, patients may have 1 support person aged 68 or older with them. For treatment visits, patients cannot have anyone with them due to current Covid guidelines and our immunocompromised population.   Pembrolizumab injection What is this medication? PEMBROLIZUMAB (pem broe liz ue mab) is a monoclonal antibody. It is used totreat certain types of cancer. This medicine may be used for other purposes; ask your health care provider orpharmacist if you have questions. COMMON BRAND NAME(S): Keytruda What should I  tell my care team before I take this medication? They need to know if you have any of these conditions: autoimmune diseases like Crohn's disease, ulcerative colitis, or lupus have had or planning to have an allogeneic stem cell transplant (uses someone else's stem cells) history  of organ transplant history of chest radiation nervous system problems like myasthenia gravis or Guillain-Barre syndrome an unusual or allergic reaction to pembrolizumab, other medicines, foods, dyes, or preservatives pregnant or trying to get pregnant breast-feeding How should I use this medication? This medicine is for infusion into a vein. It is given by a health careprofessional in a hospital or clinic setting. A special MedGuide will be given to you before each treatment. Be sure to readthis information carefully each time. Talk to your pediatrician regarding the use of this medicine in children. While this drug may be prescribed for children as young as 6 months for selectedconditions, precautions do apply. Overdosage: If you think you have taken too much of this medicine contact apoison control center or emergency room at once. NOTE: This medicine is only for you. Do not share this medicine with others. What if I miss a dose? It is important not to miss your dose. Call your doctor or health careprofessional if you are unable to keep an appointment. What may interact with this medication? Interactions have not been studied. This list may not describe all possible interactions. Give your health care provider a list of all the medicines, herbs, non-prescription drugs, or dietary supplements you use. Also tell them if you smoke, drink alcohol, or use illegaldrugs. Some items may interact with your medicine. What should I watch for while using this medication? Your condition will be monitored carefully while you are receiving thismedicine. You may need blood work done while you are taking this medicine. Do not become pregnant while taking this medicine or for 4 months after stopping it. Women should inform their doctor if they wish to become pregnant or think they might be pregnant. There is a potential for serious side effects to an unborn child. Talk to your health care professional or  pharmacist for more information. Do not breast-feed an infant while taking this medicine orfor 4 months after the last dose. What side effects may I notice from receiving this medication? Side effects that you should report to your doctor or health care professionalas soon as possible: allergic reactions like skin rash, itching or hives, swelling of the face, lips, or tongue bloody or black, tarry breathing problems changes in vision chest pain chills confusion constipation cough diarrhea dizziness or feeling faint or lightheaded fast or irregular heartbeat fever flushing joint pain low blood counts - this medicine may decrease the number of white blood cells, red blood cells and platelets. You may be at increased risk for infections and bleeding. muscle pain muscle weakness pain, tingling, numbness in the hands or feet persistent headache redness, blistering, peeling or loosening of the skin, including inside the mouth signs and symptoms of high blood sugar such as dizziness; dry mouth; dry skin; fruity breath; nausea; stomach pain; increased hunger or thirst; increased urination signs and symptoms of kidney injury like trouble passing urine or change in the amount of urine signs and symptoms of liver injury like dark urine, light-colored stools, loss of appetite, nausea, right upper belly pain, yellowing of the eyes or skin sweating swollen lymph nodes weight loss Side effects that usually do not require medical attention (report to yourdoctor or health care professional if they continue  or are bothersome): decreased appetite hair loss tiredness This list may not describe all possible side effects. Call your doctor for medical advice about side effects. You may report side effects to FDA at1-800-FDA-1088. Where should I keep my medication? This drug is given in a hospital or clinic and will not be stored at home. NOTE: This sheet is a summary. It may not cover all possible  information. If you have questions about this medicine, talk to your doctor, pharmacist, orhealth care provider.  2022 Elsevier/Gold Standard (2019-10-21 21:44:53)  Pemetrexed injection What is this medication? PEMETREXED (PEM e TREX ed) is a chemotherapy drug used to treat lung cancers like non-small cell lung cancer and mesothelioma. It may also be used to treatother cancers. This medicine may be used for other purposes; ask your health care provider orpharmacist if you have questions. COMMON BRAND NAME(S): Alimta What should I tell my care team before I take this medication? They need to know if you have any of these conditions: infection (especially a virus infection such as chickenpox, cold sores, or herpes) kidney disease low blood counts, like low white cell, platelet, or red cell counts lung or breathing disease, like asthma radiation therapy an unusual or allergic reaction to pemetrexed, other medicines, foods, dyes, or preservative pregnant or trying to get pregnant breast-feeding How should I use this medication? This drug is given as an infusion into a vein. It is administered in a hospitalor clinic by a specially trained health care professional. Talk to your pediatrician regarding the use of this medicine in children.Special care may be needed. Overdosage: If you think you have taken too much of this medicine contact apoison control center or emergency room at once. NOTE: This medicine is only for you. Do not share this medicine with others. What if I miss a dose? It is important not to miss your dose. Call your doctor or health careprofessional if you are unable to keep an appointment. What may interact with this medication? This medicine may interact with the following medications: Ibuprofen This list may not describe all possible interactions. Give your health care provider a list of all the medicines, herbs, non-prescription drugs, or dietary supplements you use. Also  tell them if you smoke, drink alcohol, or use illegaldrugs. Some items may interact with your medicine. What should I watch for while using this medication? Visit your doctor for checks on your progress. This drug may make you feel generally unwell. This is not uncommon, as chemotherapy can affect healthy cells as well as cancer cells. Report any side effects. Continue your course oftreatment even though you feel ill unless your doctor tells you to stop. In some cases, you may be given additional medicines to help with side effects.Follow all directions for their use. Call your doctor or health care professional for advice if you get a fever, chills or sore throat, or other symptoms of a cold or flu. Do not treat yourself. This drug decreases your body's ability to fight infections. Try toavoid being around people who are sick. This medicine may increase your risk to bruise or bleed. Call your doctor orhealth care professional if you notice any unusual bleeding. Be careful brushing and flossing your teeth or using a toothpick because you may get an infection or bleed more easily. If you have any dental work done,tell your dentist you are receiving this medicine. Avoid taking products that contain aspirin, acetaminophen, ibuprofen, naproxen, or ketoprofen unless instructed by your doctor. These medicines may hide afever.  Call your doctor or health care professional if you get diarrhea or mouthsores. Do not treat yourself. To protect your kidneys, drink water or other fluids as directed while you aretaking this medicine. Do not become pregnant while taking this medicine or for 6 months after stopping it. Women should inform their doctor if they wish to become pregnant or think they might be pregnant. Men should not father a child while taking this medicine and for 3 months after stopping it. This may interfere with the ability to father a child. You should talk to your doctor or health care professional if  you are concerned about your fertility. There is a potential for serious side effects to an unborn child. Talk to your health care professional or pharmacist for more information. Do not breast-feed an infantwhile taking this medicine or for 1 week after stopping it. What side effects may I notice from receiving this medication? Side effects that you should report to your doctor or health care professionalas soon as possible: allergic reactions like skin rash, itching or hives, swelling of the face, lips, or tongue breathing problems redness, blistering, peeling or loosening of the skin, including inside the mouth signs and symptoms of bleeding such as bloody or black, tarry stools; red or dark-brown urine; spitting up blood or brown material that looks like coffee grounds; red spots on the skin; unusual bruising or bleeding from the eye, gums, or nose signs and symptoms of infection like fever or chills; cough; sore throat; pain or trouble passing urine signs and symptoms of kidney injury like trouble passing urine or change in the amount of urine signs and symptoms of liver injury like dark yellow or brown urine; general ill feeling or flu-like symptoms; light-colored stools; loss of appetite; nausea; right upper belly pain; unusually weak or tired; yellowing of the eyes or skin Side effects that usually do not require medical attention (report to yourdoctor or health care professional if they continue or are bothersome): constipation mouth sores nausea, vomiting unusually weak or tired This list may not describe all possible side effects. Call your doctor for medical advice about side effects. You may report side effects to FDA at1-800-FDA-1088. Where should I keep my medication? This drug is given in a hospital or clinic and will not be stored at home. NOTE: This sheet is a summary. It may not cover all possible information. If you have questions about this medicine, talk to your doctor,  pharmacist, orhealth care provider.  2022 Elsevier/Gold Standard (2018-01-08 16:11:33)  Carboplatin injection What is this medication? CARBOPLATIN (KAR boe pla tin) is a chemotherapy drug. It targets fast dividing cells, like cancer cells, and causes these cells to die. This medicine is usedto treat ovarian cancer and many other cancers. This medicine may be used for other purposes; ask your health care provider orpharmacist if you have questions. COMMON BRAND NAME(S): Paraplatin What should I tell my care team before I take this medication? They need to know if you have any of these conditions: blood disorders hearing problems kidney disease recent or ongoing radiation therapy an unusual or allergic reaction to carboplatin, cisplatin, other chemotherapy, other medicines, foods, dyes, or preservatives pregnant or trying to get pregnant breast-feeding How should I use this medication? This drug is usually given as an infusion into a vein. It is administered in [facility] or clinic by a specially trained health care professional. Talk to your pediatrician regarding the use of this medicine in children.Special care may be needed. Overdosage: If  you think you have taken too much of this medicine contact apoison control center or emergency room at once. NOTE: This medicine is only for you. Do not share this medicine with others. What if I miss a dose? It is important not to miss a dose. Call your doctor or health careprofessional if you are unable to keep an appointment. What may interact with this medication? medicines for seizures medicines to increase blood counts like filgrastim, pegfilgrastim, sargramostim some antibiotics like amikacin, gentamicin, neomycin, streptomycin, tobramycin vaccines Talk to your doctor or health care professional before taking any of thesemedicines: acetaminophen aspirin ibuprofen ketoprofen naproxen This list may not describe all possible interactions.  Give your health care provider a list of all the medicines, herbs, non-prescription drugs, or dietary supplements you use. Also tell them if you smoke, drink alcohol, or use illegaldrugs. Some items may interact with your medicine. What should I watch for while using this medication? Your condition will be monitored carefully while you are receiving this medicine. You will need important blood work done while you are taking thismedicine. This drug may make you feel generally unwell. This is not uncommon, as chemotherapy can affect healthy cells as well as cancer cells. Report any side effects. Continue your course of treatment even though you feel ill unless yourdoctor tells you to stop. In some cases, you may be given additional medicines to help with side effects.Follow all directions for their use. Call your doctor or health care professional for advice if you get a fever, chills or sore throat, or other symptoms of a cold or flu. Do not treat yourself. This drug decreases your body's ability to fight infections. Try toavoid being around people who are sick. This medicine may increase your risk to bruise or bleed. Call your doctor orhealth care professional if you notice any unusual bleeding. Be careful brushing and flossing your teeth or using a toothpick because you may get an infection or bleed more easily. If you have any dental work done,tell your dentist you are receiving this medicine. Avoid taking products that contain aspirin, acetaminophen, ibuprofen, naproxen, or ketoprofen unless instructed by your doctor. These medicines may hide afever. Do not become pregnant while taking this medicine. Women should inform their doctor if they wish to become pregnant or think they might be pregnant. There is a potential for serious side effects to an unborn child. Talk to your health care professional or pharmacist for more information. Do not breast-feed aninfant while taking this medicine. What side  effects may I notice from receiving this medication? Side effects that you should report to your doctor or health care professionalas soon as possible: allergic reactions like skin rash, itching or hives, swelling of the face, lips, or tongue signs of infection - fever or chills, cough, sore throat, pain or difficulty passing urine signs of decreased platelets or bleeding - bruising, pinpoint red spots on the skin, black, tarry stools, nosebleeds signs of decreased red blood cells - unusually weak or tired, fainting spells, lightheadedness breathing problems changes in hearing changes in vision chest pain high blood pressure low blood counts - This drug may decrease the number of white blood cells, red blood cells and platelets. You may be at increased risk for infections and bleeding. nausea and vomiting pain, swelling, redness or irritation at the injection site pain, tingling, numbness in the hands or feet problems with balance, talking, walking trouble passing urine or change in the amount of urine Side effects that usually do not  require medical attention (report to yourdoctor or health care professional if they continue or are bothersome): hair loss loss of appetite metallic taste in the mouth or changes in taste This list may not describe all possible side effects. Call your doctor for medical advice about side effects. You may report side effects to FDA at1-800-FDA-1088. Where should I keep my medication? This drug is given in a hospital or clinic and will not be stored at home. NOTE: This sheet is a summary. It may not cover all possible information. If you have questions about this medicine, talk to your doctor, pharmacist, orhealth care provider.  2022 Elsevier/Gold Standard (2008-02-24 14:38:05)

## 2021-06-15 ENCOUNTER — Telehealth: Payer: Self-pay | Admitting: *Deleted

## 2021-06-16 ENCOUNTER — Telehealth: Payer: Self-pay

## 2021-06-16 NOTE — Telephone Encounter (Signed)
Pts wife called stating pt has not had a BM since Wednesday 7/13. He denies fever but does have some abdominal pain.   I have advised them he can drink one bottle of magnesium citrate OR take Miralax with at least 8oz of fluids 1-2 times a day. I have advised if his sx worsen or pt develops a fever over the weekend, go to the ER.  Mrs. Tyrell Antonio expressed understanding of this information.

## 2021-06-19 NOTE — Telephone Encounter (Signed)
Pts wife called back today advising that Cameron Harper has been given two doses of Miralax and a dose of Milk of Magnesia and still has not have a BM since 7/13. Cameron Harper has ongoing abdominal pain with this.  Discussed this with Cassandra PA-C who also discussed this with Dr. Julien Nordmann as well and he wants Cameron Harper to continue with taking Milk of Magnesia.  I have advised pts wife of this and she expressed understanding of this information. I have also advised to please call back if this does not provide relief or his sx worsen.

## 2021-06-22 ENCOUNTER — Inpatient Hospital Stay: Payer: Managed Care, Other (non HMO) | Admitting: Internal Medicine

## 2021-06-22 ENCOUNTER — Other Ambulatory Visit: Payer: Self-pay

## 2021-06-22 ENCOUNTER — Inpatient Hospital Stay: Payer: Managed Care, Other (non HMO)

## 2021-06-22 VITALS — BP 120/68 | HR 79 | Temp 98.0°F | Resp 19 | Ht 69.0 in | Wt 158.4 lb

## 2021-06-22 DIAGNOSIS — Z5111 Encounter for antineoplastic chemotherapy: Secondary | ICD-10-CM

## 2021-06-22 DIAGNOSIS — Z5112 Encounter for antineoplastic immunotherapy: Secondary | ICD-10-CM | POA: Insufficient documentation

## 2021-06-22 DIAGNOSIS — C3432 Malignant neoplasm of lower lobe, left bronchus or lung: Secondary | ICD-10-CM

## 2021-06-22 LAB — CMP (CANCER CENTER ONLY)
ALT: 23 U/L (ref 0–44)
AST: 20 U/L (ref 15–41)
Albumin: 3.8 g/dL (ref 3.5–5.0)
Alkaline Phosphatase: 91 U/L (ref 38–126)
Anion gap: 9 (ref 5–15)
BUN: 13 mg/dL (ref 6–20)
CO2: 29 mmol/L (ref 22–32)
Calcium: 9.1 mg/dL (ref 8.9–10.3)
Chloride: 99 mmol/L (ref 98–111)
Creatinine: 0.76 mg/dL (ref 0.61–1.24)
GFR, Estimated: >60 mL/min (ref >60)
Glucose, Bld: 113 mg/dL — ABNORMAL HIGH (ref 70–99)
Potassium: 3.6 mmol/L (ref 3.5–5.1)
Sodium: 137 mmol/L (ref 135–145)
Total Bilirubin: 0.4 mg/dL (ref 0.3–1.2)
Total Protein: 7.2 g/dL (ref 6.5–8.1)

## 2021-06-22 LAB — CBC WITH DIFFERENTIAL (CANCER CENTER ONLY)
Abs Immature Granulocytes: 0.03 10*3/uL (ref 0.00–0.07)
Basophils Absolute: 0 10*3/uL (ref 0.0–0.1)
Basophils Relative: 1 %
Eosinophils Absolute: 0.2 10*3/uL (ref 0.0–0.5)
Eosinophils Relative: 7 %
HCT: 38.5 % — ABNORMAL LOW (ref 39.0–52.0)
Hemoglobin: 13.3 g/dL (ref 13.0–17.0)
Immature Granulocytes: 1 %
Lymphocytes Relative: 11 %
Lymphs Abs: 0.4 10*3/uL — ABNORMAL LOW (ref 0.7–4.0)
MCH: 30 pg (ref 26.0–34.0)
MCHC: 34.5 g/dL (ref 30.0–36.0)
MCV: 86.9 fL (ref 80.0–100.0)
Monocytes Absolute: 0.4 10*3/uL (ref 0.1–1.0)
Monocytes Relative: 10 %
Neutro Abs: 2.5 10*3/uL (ref 1.7–7.7)
Neutrophils Relative %: 70 %
Platelet Count: 178 10*3/uL (ref 150–400)
RBC: 4.43 MIL/uL (ref 4.22–5.81)
RDW: 12.7 % (ref 11.5–15.5)
WBC Count: 3.5 10*3/uL — ABNORMAL LOW (ref 4.0–10.5)
nRBC: 0 % (ref 0.0–0.2)

## 2021-06-22 NOTE — Progress Notes (Signed)
Cameron Harper [phone]   [phone]  OFFICE PROGRESS NOTE  Cameron Sites, MD Cameron Harper [id number]  DIAGNOSIS:  Stage IV (T2b, N2, M1 B) non-small cell lung cancer, adenocarcinoma presented with large central left lower lobe lung mass with left hilar and mediastinal lymphadenopathy as well as abdominal retroperitoneal lymphadenopathy diagnosed in May 2022.  The patient also has bilateral parotid gland nodules that need close monitoring.  [facility]IS MOLECULAR STUDY: Results with Therapy Associations BIOMARKER METHOD ANALYTE RESULT THERAPY ASSOCIATION BIOMARKER LEVEL* .PD-L1 (22c3) IHC Protein Positive, TPS: 50% BENEFIT cemiplimab, pembrolizumab Level 1 .PD-L1 (28-8) IHC Protein Positive  1+, 50% BENEFIT nivolumab/ipilimumab combination Level 1 .TMB Seq DNA-Tumor High, 18 mut/Mb BENEFIT pembrolizumab Level 2 . alectinib, ceritinib, crizotinib, lorlatinib Level 1 . IHC Protein Negative  0 brigatinib Level 2 . ALK Seq RNA-Tumor Fusion Not Detected LACK OF BENEFIT alectinib, brigatinib, ceritinib, crizotinib, lorlatinib Level 2 .BRAF Seq DNA-Tumor Mutation Not Detected LACK OF BENEFIT dabrafenib and trametinib combination therapy, vemurafenib Level 2 .EGFR Seq DNA-Tumor Mutation Not Detected LACK OF BENEFIT erlotinib, gefitinib Level 2 .KRAS Seq DNA-Tumor Mutation Not Detected LACK OF BENEFIT sotorasib Level 2 .RET Seq RNA-Tumor Fusion Not Detected LACK OF BENEFIT pralsetinib, selpercatinib Level 2 .ROS1 Seq RNA-Tumor Fusion Not Detected LACK OF BENEFIT ceritinib, crizotinib, entrectinib, lorlatinib Level 2 . CNA-Seq DNA-Tumor Amplification Not Detected . MET Seq RNA-Tumor Variant Transcript Not Detected LACK OF BENEFIT crizotinib Level 3  PRIOR THERAPY: Palliative radiotherapy to the large central left lower lobe lung mass under the care of Dr. Lisbeth Renshaw.  CURRENT THERAPY: Systemic chemotherapy with carboplatin  for AUC of 5, Alimta 500 Mg/M2 and possibly Keytruda 200 Mg IV every 3 weeks.  First dose expected June 14, 2021.  Status post 1 cycle of treatment.  INTERVAL HISTORY: Cameron Harper 45 y.o. male returns to the clinic today for follow-up visit accompanied by his wife.  The patient is feeling fine today with no concerning complaints except for mild fatigue.  He has 1 episode of nausea after his chemotherapy last week.  He also has some mouth sores and few episodes of diarrhea.  He denied having any current chest pain, shortness of breath except with exertion with mild cough and no hemoptysis.  He denied having any fever or chills.  He has no significant weight loss or night sweats.  His back pain is much better after receiving treatment with tramadol from his neurosurgeon.  He does not use the tramadol except 1 or 2 tablets a day as needed.  He is here today for evaluation and repeat blood work.  MEDICAL HISTORY: Past Medical History:  Diagnosis Date   Chronic back pain    Eczema    GERD (gastroesophageal reflux disease)    Lung cancer (HCC)     ALLERGIES:  is allergic to penicillins.  MEDICATIONS:  Current Outpatient Medications  Medication Sig Dispense Refill   acetaminophen (TYLENOL) 500 MG tablet Take 500-1,000 mg by mouth every 6 (six) hours as needed (for back pain.).     famotidine (PEPCID) 20 MG tablet One after supper 30 tablet 11   folic acid (FOLVITE) 1 MG tablet Take 1 tablet (1 mg total) by mouth daily. 30 tablet 4   ibuprofen (ADVIL) 200 MG tablet Take 400-600 mg by mouth every 8 (eight) hours as needed (for pain.).     nicotine (NICODERM CQ - DOSED IN MG/24 HOURS) 21 mg/24hr patch Place 21 mg onto the  skin daily.     pantoprazole (PROTONIX) 40 MG tablet TAKE 1 TABLET BY MOUTH 30-60 MINS BEFORE FIRST MEAL ONCE A DAY. 30 tablet 2   prochlorperazine (COMPAZINE) 10 MG tablet Take 1 tablet (10 mg total) by mouth every 6 (six) hours as needed for nausea or vomiting. 30 tablet 0    No current facility-administered medications for this visit.    SURGICAL HISTORY:  Past Surgical History:  Procedure Laterality Date   BRONCHIAL BRUSHINGS  05/12/2021   Procedure: BRONCHIAL BRUSHINGS;  Surgeon: Garner Nash, DO;  Location: [place];  Service: Pulmonary;;   BRONCHIAL DILITATION  05/12/2021   Procedure: BRONCHIAL DILITATION;  Surgeon: Garner Nash, DO;  Location: [place];  Service: Pulmonary;;   BRONCHIAL NEEDLE ASPIRATION BIOPSY  05/12/2021   Procedure: BRONCHIAL NEEDLE ASPIRATION BIOPSIES;  Surgeon: Garner Nash, DO;  Location: [place];  Service: Pulmonary;;   BRONCHIAL WASHINGS  05/12/2021   Procedure: BRONCHIAL WASHINGS;  Surgeon: Garner Nash, DO;  Location: [place];  Service: Pulmonary;;   CRYOTHERAPY  05/12/2021   Procedure: CRYOTHERAPY;  Surgeon: Garner Nash, DO;  Location: [place] [facility];  Service: Pulmonary;;   HEMOSTASIS CONTROL  05/12/2021   Procedure: HEMOSTASIS CONTROL;  Surgeon: Garner Nash, DO;  Location: [place] [facility];  Service: Pulmonary;;  epi injection   Spine injection     Pain control   VIDEO BRONCHOSCOPY WITH ENDOBRONCHIAL ULTRASOUND N/A 05/12/2021   Procedure: VIDEO BRONCHOSCOPY WITH ENDOBRONCHIAL ULTRASOUND;  Surgeon: Garner Nash, DO;  Location: [place];  Service: Pulmonary;  Laterality: N/A;   WISDOM TOOTH EXTRACTION     no anesthesia involed    REVIEW OF SYSTEMS:  A comprehensive review of systems was negative except for: Constitutional: positive for fatigue Respiratory: positive for cough Gastrointestinal: positive for diarrhea and nausea Musculoskeletal: positive for back pain   PHYSICAL EXAMINATION: General appearance: alert, cooperative, fatigued, and no distress Head: Normocephalic, without obvious abnormality, atraumatic Neck: no adenopathy, no JVD, supple, symmetrical, trachea midline, and thyroid not enlarged, symmetric, no tenderness/mass/nodules Lymph nodes: Cervical,  supraclavicular, and axillary nodes normal. Resp: clear to auscultation bilaterally Back: symmetric, no curvature. ROM normal. No CVA tenderness. Cardio: regular rate and rhythm, S1, S2 normal, no murmur, click, rub or gallop GI: soft, non-tender; bowel sounds normal; no masses,  no organomegaly Extremities: extremities normal, atraumatic, no cyanosis or edema  ECOG PERFORMANCE STATUS: 1 - Symptomatic but completely ambulatory  Blood pressure 120/68, pulse 79, temperature 98 F (36.7 C), temperature source Tympanic, resp. rate 19, height $RemoveBe'5\' 9"'WchfFCABD$  (1.753 m), weight 158 lb 6.4 oz (71.8 kg), SpO2 100 %.  LABORATORY DATA: Lab Results  Component Value Date   WBC 3.5 (L) 06/22/2021   HGB 13.3 06/22/2021   HCT 38.5 (L) 06/22/2021   MCV 86.9 06/22/2021   PLT 178 06/22/2021      Chemistry      Component Value Date/Time   NA 143 06/14/2021 0754   K 4.1 06/14/2021 0754   CL 109 06/14/2021 0754   CO2 24 06/14/2021 0754   BUN 11 06/14/2021 0754   CREATININE 0.78 06/14/2021 0754      Component Value Date/Time   CALCIUM 9.4 06/14/2021 0754   ALKPHOS 85 06/14/2021 0754   AST 10 (L) 06/14/2021 0754   ALT 15 06/14/2021 0754   BILITOT 0.3 06/14/2021 0754       RADIOGRAPHIC STUDIES: MR Brain W Wo Contrast  Result Date: 06/06/2021 CLINICAL DATA:  Non-small cell lung cancer staging. EXAM:  MRI HEAD WITHOUT AND WITH CONTRAST TECHNIQUE: Multiplanar, multiecho pulse sequences of the brain and surrounding structures were obtained without and with intravenous contrast. CONTRAST:  7.43mL GADAVIST GADOBUTROL 1 MMOL/ML IV SOLN COMPARISON:  None. FINDINGS: Brain: No acute infarction, hemorrhage, hydrocephalus, extra-axial collection or mass lesion. Vascular: Major arterial flow voids are maintained at the skull base. Skull and upper cervical spine: Normal marrow signal. Sinuses/Orbits: Moderate mucosal thickening of scattered ethmoid air cells. Mild mucosal thickening of bilateral frontal sinuses and  maxillary sinuses with inferior right maxillary sinus retention cyst. Unremarkable orbits. Other: No mastoid effusions. IMPRESSION: No evidence of acute abnormality or metastatic disease. Electronically Signed   By: Margaretha Sheffield MD   On: 06/06/2021 07:01    ASSESSMENT AND PLAN: This is a very pleasant 45 years old white male recently diagnosed with a stage IV (T2b, N2, M1 B) non-small cell lung cancer, adenocarcinoma presented with large central left lower lobe lung mass in addition to left hilar and mediastinal lymphadenopathy as well as retroperitoneal abdominal lymph node diagnosed in May 2022.  The patient is currently undergoing palliative radiotherapy to the large left lower lobe lung mass under the care of of Dr. Lisbeth Renshaw.  He is expected to complete this course of treatment on June 12, 2021. MRI of the brain showed no evidence of metastatic disease to the brain. His molecular studies by CAR[facility] showed no actionable mutations and PD-L1 expression of 50%. He is currently undergoing systemic chemotherapy with carboplatin for AUC of 5, Alimta 500 Mg/M2 and Keytruda 200 Mg IV every 3 weeks.  Status post 1 cycle. The patient tolerated the first week of his treatment well except for mild fatigue and 1 episode of nausea and few episodes of diarrhea that completely resolved.  He is feeling much better today. I recommended for him to continue his treatment as planned and he is expected to start cycle #2 in 2 weeks. For the back pain, he is currently on tramadol by his neurosurgeon. The patient will come back for follow-up visit in 2 weeks for evaluation before starting cycle #2. He was advised to call immediately if he has any other concerning symptoms in the interval.  The patient voices understanding of current disease status and treatment options and is in agreement with the current care plan. All questions were answered. The patient knows to call the clinic with any problems, questions or concerns. We  can certainly see the patient much sooner if necessary.  Disclaimer: This note was dictated with voice recognition software. Similar sounding words can inadvertently be transcribed and may not be corrected upon review.

## 2021-06-23 ENCOUNTER — Encounter: Payer: Self-pay | Admitting: General Practice

## 2021-06-23 NOTE — Progress Notes (Signed)
[place] CSW Progress Notes  Returned call to patient.  He has questions about when to apply for Social Security Disability income (SSDI) and how to continue to have insurance coverage if/when his employer coverage becomes inaccessible.  Referred to T[facility] Cancer, his employer HR office and possibly his employers disability insurance provider for accurate information based on his specific circumstances.  Mailed him information from [place] for his information.  Encouraged him to call back if he wants [place] to refer him to [facility] for SSDI application, after he has gathered information on the best path forward for himself based on his options w his employer benefits.  Edwyna Shell, LCSW Clinical Social Worker Phone:  [phone]

## 2021-06-26 ENCOUNTER — Telehealth: Payer: Self-pay

## 2021-06-26 NOTE — Telephone Encounter (Signed)
PAC appt has been changed to the [facility] location for Thursday 06/29/21 with an arrival time of 7:30am.   Pt is to be NPO after midnight, must have a driver. Pts wife has been advised of this and given the location information of "Short Stay, Entrance A" and go to the admitting department. Pt wife expressed understanding of this information.

## 2021-06-26 NOTE — Telephone Encounter (Signed)
Pt wife called stating they have gotten control of his constipation and is taking Miralax daily.   Pt c/o a bloating feeling with a lot of gas. Pt has been taking Gas-X but it is not helping and wants to know what he can take.  Pts wife also states pt hasn't received a call regarding his port placement. I have called IR and spoke with Tiffany who has the pt now scheduled for 12pm arrival on August 5th.

## 2021-06-27 ENCOUNTER — Encounter: Payer: Self-pay | Admitting: Internal Medicine

## 2021-06-27 NOTE — Progress Notes (Signed)
                                                                                                                                                             Patient Name: Cameron Harper MRN: [id number] DOB: 1976/05/21 Referring Physician: June Leap Date of Service: 06/12/2021 [facility],                                                         End Of Treatment Note  Diagnoses: C34.32-Malignant neoplasm of lower lobe, left bronchus or lung  Cancer Staging: Stage IV, cT2N1M1b,NSCLC, poorly differentiated adenocarcinoma of the LLL  Intent: Palliative  Radiation Treatment Dates: 05/22/2021 through 06/12/2021 Site Technique Total Dose (Gy) Dose per Fx (Gy) Completed Fx Beam Energies  Lung, Left: Lung_Lt 3D 37.5/37.5 2.5 15/15 6X, 10X, 15X   Narrative: The patient tolerated radiation therapy relatively well.   Plan: The patient will receive a call in about one month from the radiation oncology department. He will continue follow up with Dr. Julien Nordmann as well.   ________________________________________________    Carola Rhine, [facility]

## 2021-06-28 ENCOUNTER — Other Ambulatory Visit: Payer: Self-pay | Admitting: Radiology

## 2021-06-28 ENCOUNTER — Inpatient Hospital Stay: Payer: Managed Care, Other (non HMO)

## 2021-06-28 ENCOUNTER — Other Ambulatory Visit: Payer: Self-pay

## 2021-06-28 ENCOUNTER — Other Ambulatory Visit: Payer: Self-pay | Admitting: Student

## 2021-06-28 DIAGNOSIS — Z5112 Encounter for antineoplastic immunotherapy: Secondary | ICD-10-CM | POA: Diagnosis not present

## 2021-06-28 DIAGNOSIS — C3432 Malignant neoplasm of lower lobe, left bronchus or lung: Secondary | ICD-10-CM

## 2021-06-28 LAB — CBC WITH DIFFERENTIAL (CANCER CENTER ONLY)
Abs Immature Granulocytes: 0.01 10*3/uL (ref 0.00–0.07)
Basophils Absolute: 0 10*3/uL (ref 0.0–0.1)
Basophils Relative: 0 %
Eosinophils Absolute: 0.2 10*3/uL (ref 0.0–0.5)
Eosinophils Relative: 6 %
HCT: 35.5 % — ABNORMAL LOW (ref 39.0–52.0)
Hemoglobin: 11.9 g/dL — ABNORMAL LOW (ref 13.0–17.0)
Immature Granulocytes: 0 %
Lymphocytes Relative: 17 %
Lymphs Abs: 0.5 10*3/uL — ABNORMAL LOW (ref 0.7–4.0)
MCH: 29.8 pg (ref 26.0–34.0)
MCHC: 33.5 g/dL (ref 30.0–36.0)
MCV: 89 fL (ref 80.0–100.0)
Monocytes Absolute: 0.4 10*3/uL (ref 0.1–1.0)
Monocytes Relative: 17 %
Neutro Abs: 1.6 10*3/uL — ABNORMAL LOW (ref 1.7–7.7)
Neutrophils Relative %: 60 %
Platelet Count: 105 10*3/uL — ABNORMAL LOW (ref 150–400)
RBC: 3.99 MIL/uL — ABNORMAL LOW (ref 4.22–5.81)
RDW: 13.1 % (ref 11.5–15.5)
WBC Count: 2.6 10*3/uL — ABNORMAL LOW (ref 4.0–10.5)
nRBC: 0 % (ref 0.0–0.2)

## 2021-06-28 LAB — CMP (CANCER CENTER ONLY)
ALT: 16 U/L (ref 0–44)
AST: 13 U/L — ABNORMAL LOW (ref 15–41)
Albumin: 3.2 g/dL — ABNORMAL LOW (ref 3.5–5.0)
Alkaline Phosphatase: 101 U/L (ref 38–126)
Anion gap: 9 (ref 5–15)
BUN: 8 mg/dL (ref 6–20)
CO2: 27 mmol/L (ref 22–32)
Calcium: 9.4 mg/dL (ref 8.9–10.3)
Chloride: 103 mmol/L (ref 98–111)
Creatinine: 0.76 mg/dL (ref 0.61–1.24)
GFR, Estimated: >60 mL/min (ref >60)
Glucose, Bld: 116 mg/dL — ABNORMAL HIGH (ref 70–99)
Potassium: 3.8 mmol/L (ref 3.5–5.1)
Sodium: 139 mmol/L (ref 135–145)
Total Bilirubin: 0.3 mg/dL (ref 0.3–1.2)
Total Protein: 7 g/dL (ref 6.5–8.1)

## 2021-06-29 ENCOUNTER — Ambulatory Visit (HOSPITAL_COMMUNITY)
Admission: RE | Admit: 2021-06-29 | Discharge: 2021-06-29 | Disposition: A | Discharge status: Home / Self Care | Payer: Managed Care, Other (non HMO) | Source: Ambulatory Visit | Attending: Internal Medicine | Admitting: Internal Medicine

## 2021-06-29 ENCOUNTER — Encounter (HOSPITAL_COMMUNITY): Payer: Self-pay

## 2021-06-29 DIAGNOSIS — Z8616 Personal history of COVID-19: Secondary | ICD-10-CM | POA: Diagnosis not present

## 2021-06-29 DIAGNOSIS — Z79899 Other long term (current) drug therapy: Secondary | ICD-10-CM | POA: Diagnosis not present

## 2021-06-29 DIAGNOSIS — Z87891 Personal history of nicotine dependence: Secondary | ICD-10-CM | POA: Insufficient documentation

## 2021-06-29 DIAGNOSIS — C3432 Malignant neoplasm of lower lobe, left bronchus or lung: Secondary | ICD-10-CM | POA: Diagnosis present

## 2021-06-29 DIAGNOSIS — Z880 Allergy status to penicillin: Secondary | ICD-10-CM | POA: Diagnosis not present

## 2021-06-29 HISTORY — PX: IR IMAGING GUIDED PORT INSERTION: IMG5740

## 2021-06-29 MED ORDER — LIDOCAINE-EPINEPHRINE 1 %-1:100000 IJ SOLN
INTRAMUSCULAR | Status: AC
  Filled 2021-06-29: qty 1

## 2021-06-29 MED ORDER — ACETAMINOPHEN 325 MG PO TABS: 325.0000 mg | ORAL_TABLET | Freq: Once | ORAL | Status: DC

## 2021-06-29 MED ORDER — HEPARIN SOD (PORK) LOCK FLUSH 100 UNIT/ML IV SOLN
INTRAVENOUS | Status: AC
  Filled 2021-06-29: qty 5

## 2021-06-29 MED ORDER — SODIUM CHLORIDE 0.9 % IV SOLN: INTRAVENOUS | Status: DC

## 2021-06-29 MED ORDER — OXYCODONE HCL 5 MG PO TABS: 5.0000 mg | ORAL_TABLET | Freq: Once | ORAL | Status: DC

## 2021-06-29 MED ORDER — LIDOCAINE HCL 1 % IJ SOLN
INTRAMUSCULAR | Status: AC
  Filled 2021-06-29: qty 20

## 2021-06-29 MED ORDER — FENTANYL CITRATE (PF) 100 MCG/2ML IJ SOLN
INTRAMUSCULAR | Status: AC | PRN
  Administered 2021-06-29: 25 ug via INTRAVENOUS
  Administered 2021-06-29 (×5): 50 ug via INTRAVENOUS

## 2021-06-29 MED ORDER — MIDAZOLAM HCL 2 MG/2ML IJ SOLN
INTRAMUSCULAR | Status: AC
  Filled 2021-06-29: qty 4

## 2021-06-29 MED ORDER — LIDOCAINE HCL 1 % IJ SOLN
INTRAMUSCULAR | Status: AC | PRN
Start: 2021-06-29 — End: 2021-06-29
  Administered 2021-06-29: 10 mL

## 2021-06-29 MED ORDER — MIDAZOLAM HCL 2 MG/2ML IJ SOLN
INTRAMUSCULAR | Status: AC | PRN
  Administered 2021-06-29 (×4): 1 mg via INTRAVENOUS

## 2021-06-29 MED ORDER — FENTANYL CITRATE (PF) 100 MCG/2ML IJ SOLN
INTRAMUSCULAR | Status: AC
  Filled 2021-06-29: qty 4

## 2021-06-29 NOTE — Progress Notes (Signed)
Discharge instructions reviewed with pt and his wife. Both voice understanding. 

## 2021-06-29 NOTE — Procedures (Addendum)
Interventional Radiology Procedure:   Indications: Lung cancer  Procedure: Port placement  Findings: Right jugular port, tip at SVC/RA junction  Complications: None     EBL: Minimal, less than 10 ml  Plan: Discharge in one hour.  Keep port site and incisions dry for at least 24 hours.     Cartel Mauss R. Anselm Pancoast, MD  Pager: [phone]   (No note.)

## 2021-06-29 NOTE — H&P (Signed)
Chief Complaint: Patient was seen in consultation today for image guided Port-A-Cath placement at the request of [facility]  Referring Physician(s): Mohamed,Mohamed  Supervising Physician: Markus Daft  Patient Status: [facility] - Out-pt  History of Present Illness: Cameron Harper is a 45 y.o. male with past medical history of eczema, GERD, chronic back pain, and recent diagnosis of lung cancer in left lower lobe.  Patient was diagnosed with COVID in September 2021, had persistent cough which got worse and around November 2021.  Patient was treated with antibiotic, but continued to have refractory cough as well as chest tightness and shortness of breath.  Patient was referred to Dr. Melvyn Novas in pulmonary disease, CXR on 05/07/1721 showed mediastinal mass, PET scan on 05/02/2021 showed hypermetabolic mass or lymphadenopathy involving the left helium and central left lower lobe, hypermetabolic lymphadenopathy also seen within the mediastinum, bilateral parotid glands, and abdominal retroperitoneum. Patient underwent bronchoscopy and biopsy, pathology revealed non-small cell carcinoma.  Patient was referred to oncology and radiation oncology, had initial consultations with Dr. Earlie Server and Worthy Flank, PA-C  in mid June 2022. A palliative radiotherapy with systemic chemotherapy was recommended to the patient, after thorough discussion and shared decision making, patient decided to proceed with radiation therapy and systemic chemotherapy.  IR was requested for image guided Port-A-Cath placement.  Patient laying in bed, not in acute distress.  Patient reports abdominal pain after his first chemotherapy session, but does not have abdominal pain today. Denise headache, fever, chills, shortness of breath, cough, chest pain, nausea ,vomiting, and bleeding.  A note from Short Stay RN stating that there was a singed and held order by Dr. Earlie Server for Tylenol and oxycodone to be given at 8 AM today.   Spoke with the patient and he is currently not in any pain, he thinks that the medicine was prescribed in the past for his back pain.  Patient states that he does not think he needs dose medications. Orders discontinued.  Past Medical History:  Diagnosis Date   Chronic back pain    Eczema    GERD (gastroesophageal reflux disease)    Lung cancer [facility])     Past Surgical History:  Procedure Laterality Date   BRONCHIAL BRUSHINGS  05/12/2021   Procedure: BRONCHIAL BRUSHINGS;  Surgeon: Garner Nash, DO;  Location: [place] [facility];  Service: Pulmonary;;   BRONCHIAL DILITATION  05/12/2021   Procedure: BRONCHIAL DILITATION;  Surgeon: Garner Nash, DO;  Location: [place] [facility];  Service: Pulmonary;;   BRONCHIAL NEEDLE ASPIRATION BIOPSY  05/12/2021   Procedure: BRONCHIAL NEEDLE ASPIRATION BIOPSIES;  Surgeon: Garner Nash, DO;  Location: [place] [facility];  Service: Pulmonary;;   BRONCHIAL WASHINGS  05/12/2021   Procedure: BRONCHIAL WASHINGS;  Surgeon: Garner Nash, DO;  Location: [place] [facility];  Service: Pulmonary;;   CRYOTHERAPY  05/12/2021   Procedure: CRYOTHERAPY;  Surgeon: Garner Nash, DO;  Location: [place] [facility];  Service: Pulmonary;;   HEMOSTASIS CONTROL  05/12/2021   Procedure: HEMOSTASIS CONTROL;  Surgeon: Garner Nash, DO;  Location: [place] [facility];  Service: Pulmonary;;  epi injection   Spine injection     Pain control   VIDEO BRONCHOSCOPY WITH ENDOBRONCHIAL ULTRASOUND N/A 05/12/2021   Procedure: VIDEO BRONCHOSCOPY WITH ENDOBRONCHIAL ULTRASOUND;  Surgeon: Garner Nash, DO;  Location: [place];  Service: Pulmonary;  Laterality: N/A;   WISDOM TOOTH EXTRACTION     no anesthesia involed    Allergies: Penicillins  Medications: Prior to Admission medications   Medication Sig Start Date End Date  Taking? Authorizing Provider  acetaminophen (TYLENOL) 500 MG tablet Take 500-1,000 mg by mouth every 6 (six) hours as needed (for back pain.).   Yes [provider]   folic acid (FOLVITE) 1 MG tablet Take 1 tablet (1 mg total) by mouth daily. 05/31/21  Yes Curt Bears, MD  prochlorperazine (COMPAZINE) 10 MG tablet Take 1 tablet (10 mg total) by mouth every 6 (six) hours as needed for nausea or vomiting. 05/31/21  Yes Curt Bears, MD  traMADol (ULTRAM) 50 MG tablet Take 50 mg by mouth every 6 (six) hours as needed for severe pain.   Yes [provider]  zolpidem (AMBIEN) 10 MG tablet Take 5-10 mg by mouth at bedtime as needed for sleep. 05/23/21  Yes [provider]     Family History  Problem Relation Age of Onset   Heart attack Maternal Grandmother    CAD Maternal Grandmother    Diabetes Maternal Grandfather     Social History   Socioeconomic History   Marital status: Married    Spouse name: Not on file   Number of children: Not on file   Years of education: Not on file   Highest education level: Not on file  Occupational History   Not on file  Tobacco Use   Smoking status: Former    Packs/day: 1.00    Years: 25.00    Pack years: 25.00    Types: Cigarettes    Quit date: 04/18/2021    Years since quitting: 0.1   Smokeless tobacco: Never   Tobacco comments:    currently 7 cigs per day/ 04/18/21//lmr  Vaping Use   Vaping Use: Not on file  Substance and Sexual Activity   Alcohol use: No   Drug use: No   Sexual activity: Not on file  Other Topics Concern   Not on file  Social History Narrative   Not on file   Social Determinants of Health   Financial Resource Strain: Low Risk    Difficulty of Paying Living Expenses: Not very hard  Food Insecurity: No Food Insecurity   Worried About Running Out of Food in the Last Year: Never true   [place] in the Last Year: Never true  Transportation Needs: No Transportation Needs   Lack of Transportation (Medical): No   Lack of Transportation (Non-Medical): No  Physical Activity: Not on file  Stress: Not on file  Social Connections: Socially Integrated    Frequency of Communication with Friends and Family: More than three times a week   Frequency of Social Gatherings with Friends and Family: More than three times a week   Attends Religious Services: More than 4 times per year   Active Member of [employer] or Organizations: Yes   Attends Music therapist: More than 4 times per year   Marital Status: Married     Review of Systems: A 12 point ROS discussed and pertinent positives are indicated in the HPI above.  All other systems are negative.   Vital Signs: BP 114/61   Pulse 74   Temp 99 F (37.2 C) (Oral)   Ht 5\' 9"  (1.753 m)   Wt 161 lb (73 kg)   SpO2 99%   BMI 23.78 kg/m   Physical Exam Vitals reviewed.  Constitutional:      Appearance: Normal appearance.  HENT:     Head: Normocephalic and atraumatic.     Mouth/Throat:     Mouth: Mucous membranes are moist.  Cardiovascular:  Rate and Rhythm: Normal rate and regular rhythm.     Pulses: Normal pulses.     Heart sounds: Normal heart sounds.  Pulmonary:     Effort: Pulmonary effort is normal.     Breath sounds: Wheezing present.     Comments: Expiratory wheezing on the right lower lobe Abdominal:     General: Abdomen is flat. Bowel sounds are normal.     Palpations: Abdomen is soft.  Musculoskeletal:     Cervical back: Neck supple.  Skin:    General: Skin is warm and dry.     Coloration: Skin is not jaundiced or pale.  Neurological:     Mental Status: He is alert and oriented to person, place, and time.  Psychiatric:        Mood and Affect: Mood normal.        Behavior: Behavior normal.        Judgment: Judgment normal.    MD Evaluation Airway: WNL Heart: WNL Abdomen: WNL Chest/ Lungs: WNL ASA  Classification: 3 Mallampati/Airway Score: Two  Imaging: MR Brain W Wo Contrast  Result Date: 06/06/2021 CLINICAL DATA:  Non-small cell lung cancer staging. EXAM: MRI HEAD WITHOUT AND WITH CONTRAST TECHNIQUE: Multiplanar, multiecho pulse sequences of the  brain and surrounding structures were obtained without and with intravenous contrast. CONTRAST:  7.16mL GADAVIST GADOBUTROL 1 MMOL/ML IV SOLN COMPARISON:  None. FINDINGS: Brain: No acute infarction, hemorrhage, hydrocephalus, extra-axial collection or mass lesion. Vascular: Major arterial flow voids are maintained at the skull base. Skull and upper cervical spine: Normal marrow signal. Sinuses/Orbits: Moderate mucosal thickening of scattered ethmoid air cells. Mild mucosal thickening of bilateral frontal sinuses and maxillary sinuses with inferior right maxillary sinus retention cyst. Unremarkable orbits. Other: No mastoid effusions. IMPRESSION: No evidence of acute abnormality or metastatic disease. Electronically Signed   By: Margaretha Sheffield MD   On: 06/06/2021 07:01    Labs:  CBC: Recent Labs    05/31/21 1435 06/14/21 0754 06/22/21 1108 06/28/21 1106  WBC 6.8 5.9 3.5* 2.6*  HGB 13.0 13.2 13.3 11.9*  HCT 39.8 40.8 38.5* 35.5*  PLT 312 258 178 105*    COAGS: No results for input(s): INR, APTT in the last 8760 hours.  BMP: Recent Labs    05/31/21 1435 06/14/21 0754 06/22/21 1108 06/28/21 1106  NA 144 143 137 139  K 3.5 4.1 3.6 3.8  CL 107 109 99 103  CO2 24 24 29 27   GLUCOSE 125* 92 113* 116*  BUN 8 11 13 8   CALCIUM 9.4 9.4 9.1 9.4  CREATININE 0.76 0.78 0.76 0.76  GFRNONAA >60 >60 >60 >60    LIVER FUNCTION TESTS: Recent Labs    05/31/21 1435 06/14/21 0754 06/22/21 1108 06/28/21 1106  BILITOT 0.3 0.3 0.4 0.3  AST 12* 10* 20 13*  ALT 14 15 23 16   ALKPHOS 98 85 91 101  PROT 7.2 7.3 7.2 7.0  ALBUMIN 3.3* 3.3* 3.8 3.2*    TUMOR MARKERS: No results for input(s): AFPTM, CEA, CA199, CHROMGRNA in the last 8760 hours.  Assessment and Plan: 45 y.o. male with recent diagnosis of non-small cell carcinoma of LLL biopsy-proven. Patient had consultations with oncology and radiation oncology and systemic chemotherapy with palliative radiation therapy was recommended to the  patient.  After thorough discussion and shared decision with oncology team, patient proceeded with systemic chemotherapy and palliative radiation therapy.  IR was requested for image guided Port-A-Cath placement. Patient presents to [facility] IR today for the procedure. N.p.o.  since midnight VSS Not on anticoagulation or antiplatelet treatment.  Risks and benefits of image guided port-a-catheter placement was discussed with the patient including, but not limited to bleeding, infection, pneumothorax, or fibrin sheath development and need for additional procedures.  All of the patient's questions were answered, patient is agreeable to proceed. Consent signed and in chart.   Thank you for this interesting consult.  I greatly enjoyed meeting Cameron Harper and look forward to participating in their care.  A copy of this report was sent to the requesting provider on this date.  Electronically Signed: Tera Mater, PA-C 06/29/2021, 8:51 AM   I spent a total of  30 Minutes   in face to face in clinical consultation, greater than 50% of which was counseling/coordinating care for image guided Port-A-Cath placement

## 2021-06-30 ENCOUNTER — Other Ambulatory Visit: Payer: Self-pay

## 2021-06-30 ENCOUNTER — Other Ambulatory Visit: Payer: Self-pay | Admitting: Physician Assistant

## 2021-06-30 DIAGNOSIS — C3432 Malignant neoplasm of lower lobe, left bronchus or lung: Secondary | ICD-10-CM

## 2021-06-30 MED ORDER — LIDOCAINE-PRILOCAINE 2.5-2.5 % EX CREA: 1.0000 "application " | TOPICAL_CREAM | CUTANEOUS | 2 refills | Status: AC | PRN

## 2021-07-04 ENCOUNTER — Telehealth: Payer: Self-pay | Admitting: *Deleted

## 2021-07-04 NOTE — Telephone Encounter (Signed)
Connected with patient regarding FMLA form received yesterday.   "I am requesting a twelve week, continuous FMLA.  I did go in a few hours yesterday to help out."  Advised to communicate with HR regarding benefits.  Short and/or long term disability or other accommodations may be options if selected to use securing employment benefits if or when twelve weeks allowed is exhausted.  Annual FMLA time frame defined per employer may not be per calender year.   Continuous FMLA indicated with start date of 07/05/2021 through inconclusive estimated end date up to six months.

## 2021-07-05 ENCOUNTER — Inpatient Hospital Stay: Payer: Managed Care, Other (non HMO)

## 2021-07-05 ENCOUNTER — Other Ambulatory Visit: Payer: Self-pay

## 2021-07-05 ENCOUNTER — Inpatient Hospital Stay: Payer: Managed Care, Other (non HMO) | Attending: Internal Medicine | Admitting: Internal Medicine

## 2021-07-05 VITALS — BP 109/69 | HR 69 | Temp 97.1°F | Resp 19 | Ht 69.0 in | Wt 162.3 lb

## 2021-07-05 DIAGNOSIS — C3432 Malignant neoplasm of lower lobe, left bronchus or lung: Secondary | ICD-10-CM

## 2021-07-05 DIAGNOSIS — Z79899 Other long term (current) drug therapy: Secondary | ICD-10-CM | POA: Diagnosis not present

## 2021-07-05 DIAGNOSIS — Z5112 Encounter for antineoplastic immunotherapy: Secondary | ICD-10-CM | POA: Diagnosis present

## 2021-07-05 DIAGNOSIS — Z5111 Encounter for antineoplastic chemotherapy: Secondary | ICD-10-CM | POA: Diagnosis present

## 2021-07-05 LAB — TSH: TSH: 1.822 u[IU]/mL (ref 0.320–4.118)

## 2021-07-05 LAB — CMP (CANCER CENTER ONLY)
ALT: 23 U/L (ref 0–44)
AST: 15 U/L (ref 15–41)
Albumin: 3.3 g/dL — ABNORMAL LOW (ref 3.5–5.0)
Alkaline Phosphatase: 102 U/L (ref 38–126)
Anion gap: 10 (ref 5–15)
BUN: 7 mg/dL (ref 6–20)
CO2: 24 mmol/L (ref 22–32)
Calcium: 9.7 mg/dL (ref 8.9–10.3)
Chloride: 107 mmol/L (ref 98–111)
Creatinine: 0.84 mg/dL (ref 0.61–1.24)
GFR, Estimated: >60 mL/min (ref >60)
Glucose, Bld: 95 mg/dL (ref 70–99)
Potassium: 4.1 mmol/L (ref 3.5–5.1)
Sodium: 141 mmol/L (ref 135–145)
Total Bilirubin: <0.2 mg/dL — ABNORMAL LOW (ref 0.3–1.2)
Total Protein: 7.3 g/dL (ref 6.5–8.1)

## 2021-07-05 LAB — CBC WITH DIFFERENTIAL (CANCER CENTER ONLY)
Abs Immature Granulocytes: 0.05 10*3/uL (ref 0.00–0.07)
Basophils Absolute: 0 10*3/uL (ref 0.0–0.1)
Basophils Relative: 1 %
Eosinophils Absolute: 0.1 10*3/uL (ref 0.0–0.5)
Eosinophils Relative: 3 %
HCT: 35.8 % — ABNORMAL LOW (ref 39.0–52.0)
Hemoglobin: 11.9 g/dL — ABNORMAL LOW (ref 13.0–17.0)
Immature Granulocytes: 1 %
Lymphocytes Relative: 16 %
Lymphs Abs: 0.6 10*3/uL — ABNORMAL LOW (ref 0.7–4.0)
MCH: 30 pg (ref 26.0–34.0)
MCHC: 33.2 g/dL (ref 30.0–36.0)
MCV: 90.2 fL (ref 80.0–100.0)
Monocytes Absolute: 0.6 10*3/uL (ref 0.1–1.0)
Monocytes Relative: 14 %
Neutro Abs: 2.5 10*3/uL (ref 1.7–7.7)
Neutrophils Relative %: 65 %
Platelet Count: 299 10*3/uL (ref 150–400)
RBC: 3.97 MIL/uL — ABNORMAL LOW (ref 4.22–5.81)
RDW: 13.8 % (ref 11.5–15.5)
WBC Count: 3.8 10*3/uL — ABNORMAL LOW (ref 4.0–10.5)
nRBC: 0 % (ref 0.0–0.2)

## 2021-07-05 MED ORDER — SODIUM CHLORIDE 0.9 % IV SOLN
741.0000 mg | Freq: Once | INTRAVENOUS | Status: AC
  Administered 2021-07-05: 740 mg via INTRAVENOUS
  Filled 2021-07-05: qty 74

## 2021-07-05 MED ORDER — SODIUM CHLORIDE 0.9 % IV SOLN
150.0000 mg | Freq: Once | INTRAVENOUS | Status: AC
  Administered 2021-07-05: 150 mg via INTRAVENOUS
  Filled 2021-07-05: qty 150

## 2021-07-05 MED ORDER — SODIUM CHLORIDE 0.9 % IV SOLN
10.0000 mg | Freq: Once | INTRAVENOUS | Status: AC
  Administered 2021-07-05: 10 mg via INTRAVENOUS
  Filled 2021-07-05: qty 10

## 2021-07-05 MED ORDER — PALONOSETRON HCL INJECTION 0.25 MG/5ML
INTRAVENOUS | Status: AC
  Filled 2021-07-05: qty 5

## 2021-07-05 MED ORDER — SODIUM CHLORIDE 0.9 % IV SOLN
Freq: Once | INTRAVENOUS | Status: AC
  Filled 2021-07-05: qty 250

## 2021-07-05 MED ORDER — SODIUM CHLORIDE 0.9 % IV SOLN
200.0000 mg | Freq: Once | INTRAVENOUS | Status: AC
  Administered 2021-07-05: 200 mg via INTRAVENOUS
  Filled 2021-07-05: qty 8

## 2021-07-05 MED ORDER — SODIUM CHLORIDE 0.9 % IV SOLN
500.0000 mg/m2 | Freq: Once | INTRAVENOUS | Status: AC
  Administered 2021-07-05: 1000 mg via INTRAVENOUS
  Filled 2021-07-05: qty 40

## 2021-07-05 MED ORDER — HEPARIN SOD (PORK) LOCK FLUSH 100 UNIT/ML IV SOLN
500.0000 [IU] | Freq: Once | INTRAVENOUS | Status: AC | PRN
  Administered 2021-07-05: 500 [IU]
  Filled 2021-07-05: qty 5

## 2021-07-05 MED ORDER — PALONOSETRON HCL INJECTION 0.25 MG/5ML
0.2500 mg | Freq: Once | INTRAVENOUS | Status: AC
  Administered 2021-07-05: 0.25 mg via INTRAVENOUS

## 2021-07-05 MED ORDER — SODIUM CHLORIDE 0.9% FLUSH
10.0000 mL | INTRAVENOUS | Status: DC | PRN
Start: 2021-07-05 — End: 2021-07-05
  Administered 2021-07-05: 10 mL
  Filled 2021-07-05: qty 10

## 2021-07-05 NOTE — Progress Notes (Signed)
[facility] Telephone:([phone]   Fax:([phone]  OFFICE PROGRESS NOTE  [name], MD [place] [address]  DIAGNOSIS:  Stage IV (T2b, N2, M1 B) non-small cell lung cancer, adenocarcinoma presented with large central left lower lobe lung mass with left hilar and mediastinal lymphadenopathy as well as abdominal retroperitoneal lymphadenopathy diagnosed in May 2022.  The patient also has bilateral parotid gland nodules that need close monitoring.  [facility] MOLECULAR STUDY: Results with Therapy Associations BIOMARKER METHOD ANALYTE RESULT THERAPY ASSOCIATION BIOMARKER LEVEL* .PD-L1 (22c3) IHC Protein Positive, TPS: 50% BENEFIT cemiplimab, pembrolizumab Level 1 .PD-L1 (28-8) IHC Protein Positive  1+, 50% BENEFIT nivolumab/ipilimumab combination Level 1 .TMB Seq DNA-Tumor High, 18 mut/Mb BENEFIT pembrolizumab Level 2 . alectinib, ceritinib, crizotinib, lorlatinib Level 1 . IHC Protein Negative  0 brigatinib Level 2 . ALK Seq RNA-Tumor Fusion Not Detected LACK OF BENEFIT alectinib, brigatinib, ceritinib, crizotinib, lorlatinib Level 2 .BRAF Seq DNA-Tumor Mutation Not Detected LACK OF BENEFIT dabrafenib and trametinib combination therapy, vemurafenib Level 2 .EGFR Seq DNA-Tumor Mutation Not Detected LACK OF BENEFIT erlotinib, gefitinib Level 2 .KRAS Seq DNA-Tumor Mutation Not Detected LACK OF BENEFIT sotorasib Level 2 .RET Seq RNA-Tumor Fusion Not Detected LACK OF BENEFIT pralsetinib, selpercatinib Level 2 .ROS1 Seq RNA-Tumor Fusion Not Detected LACK OF BENEFIT ceritinib, crizotinib, entrectinib, lorlatinib Level 2 . CNA-Seq DNA-Tumor Amplification Not Detected . MET Seq RNA-Tumor Variant Transcript Not Detected LACK OF BENEFIT crizotinib Level 3  PRIOR THERAPY: Palliative radiotherapy to the large central left lower lobe lung mass under the care of Dr. Lisbeth Renshaw.  CURRENT THERAPY: Systemic chemotherapy with carboplatin  for AUC of 5, Alimta 500 Mg/M2 and possibly Keytruda 200 Mg IV every 3 weeks.  First dose expected June 14, 2021.  Status post 1 cycle of treatment.  INTERVAL HISTORY: Cameron Harper 45 y.o. male returns to the clinic today for follow-up visit.  The patient is feeling fine today with no concerning complaints.  He tolerated the first cycle of his treatment fairly well with no concerning adverse effects.  He denied having any chest pain, shortness of breath, cough or hemoptysis.  He denied having any fever or chills.  He has no nausea, vomiting, diarrhea or constipation.  He has no headache or visual changes.  The patient is here today for evaluation before starting cycle #2 of his treatment.   MEDICAL HISTORY: Past Medical History:  Diagnosis Date   Chronic back pain    Eczema    GERD (gastroesophageal reflux disease)    Lung cancer (HCC)     ALLERGIES:  is allergic to penicillins.  MEDICATIONS:  Current Outpatient Medications  Medication Sig Dispense Refill   acetaminophen (TYLENOL) 500 MG tablet Take 500-1,000 mg by mouth every 6 (six) hours as needed (for back pain.).     folic acid (FOLVITE) 1 MG tablet Take 1 tablet (1 mg total) by mouth daily. 30 tablet 4   lidocaine-prilocaine (EMLA) cream Apply 1 application topically as needed. 30 g 2   prochlorperazine (COMPAZINE) 10 MG tablet Take 1 tablet (10 mg total) by mouth every 6 (six) hours as needed for nausea or vomiting. 30 tablet 0   traMADol (ULTRAM) 50 MG tablet Take 50 mg by mouth every 6 (six) hours as needed for severe pain.     zolpidem (AMBIEN) 10 MG tablet Take 5-10 mg by mouth at bedtime as needed for sleep.     No current facility-administered medications for this visit.  SURGICAL HISTORY:  Past Surgical History:  Procedure Laterality Date   BRONCHIAL BRUSHINGS  05/12/2021   Procedure: BRONCHIAL BRUSHINGS;  Surgeon: Garner Nash, DO;  Location: [facility];  Service: Pulmonary;;   BRONCHIAL DILITATION   05/12/2021   Procedure: BRONCHIAL DILITATION;  Surgeon: Garner Nash, DO;  Location: [facility];  Service: Pulmonary;;   BRONCHIAL NEEDLE ASPIRATION BIOPSY  05/12/2021   Procedure: BRONCHIAL NEEDLE ASPIRATION BIOPSIES;  Surgeon: Garner Nash, DO;  Location: [facility];  Service: Pulmonary;;   BRONCHIAL WASHINGS  05/12/2021   Procedure: BRONCHIAL WASHINGS;  Surgeon: Garner Nash, DO;  Location: [facility];  Service: Pulmonary;;   CRYOTHERAPY  05/12/2021   Procedure: CRYOTHERAPY;  Surgeon: Garner Nash, DO;  Location: [facility];  Service: Pulmonary;;   HEMOSTASIS CONTROL  05/12/2021   Procedure: HEMOSTASIS CONTROL;  Surgeon: Garner Nash, DO;  Location: [facility];  Service: Pulmonary;;  epi injection   IR IMAGING GUIDED PORT INSERTION  06/29/2021   Spine injection     Pain control   VIDEO BRONCHOSCOPY WITH ENDOBRONCHIAL ULTRASOUND N/A 05/12/2021   Procedure: VIDEO BRONCHOSCOPY WITH ENDOBRONCHIAL ULTRASOUND;  Surgeon: Garner Nash, DO;  Location: [facility];  Service: Pulmonary;  Laterality: N/A;   WISDOM TOOTH EXTRACTION     no anesthesia involed    REVIEW OF SYSTEMS:  A comprehensive review of systems was negative.   PHYSICAL EXAMINATION: General appearance: alert, cooperative, and no distress Head: Normocephalic, without obvious abnormality, atraumatic Neck: no adenopathy, no JVD, supple, symmetrical, trachea midline, and thyroid not enlarged, symmetric, no tenderness/mass/nodules Lymph nodes: Cervical, supraclavicular, and axillary nodes normal. Resp: clear to auscultation bilaterally Back: symmetric, no curvature. ROM normal. No CVA tenderness. Cardio: regular rate and rhythm, S1, S2 normal, no murmur, click, rub or gallop GI: soft, non-tender; bowel sounds normal; no masses,  no organomegaly Extremities: extremities normal, atraumatic, no cyanosis or edema  ECOG PERFORMANCE STATUS: 1 - Symptomatic but completely ambulatory  Blood pressure 109/69,  pulse 69, temperature (!) 97.1 F (36.2 C), temperature source Tympanic, resp. rate 19, height $RemoveBe'5\' 9"'SEzpnvpOA$  (1.753 m), weight 162 lb 4.8 oz (73.6 kg), SpO2 100 %.  LABORATORY DATA: Lab Results  Component Value Date   WBC 2.6 (L) 06/28/2021   HGB 11.9 (L) 06/28/2021   HCT 35.5 (L) 06/28/2021   MCV 89.0 06/28/2021   PLT 105 (L) 06/28/2021      Chemistry      Component Value Date/Time   NA 139 06/28/2021 1106   K 3.8 06/28/2021 1106   CL 103 06/28/2021 1106   CO2 27 06/28/2021 1106   BUN 8 06/28/2021 1106   CREATININE 0.76 06/28/2021 1106      Component Value Date/Time   CALCIUM 9.4 06/28/2021 1106   ALKPHOS 101 06/28/2021 1106   AST 13 (L) 06/28/2021 1106   ALT 16 06/28/2021 1106   BILITOT 0.3 06/28/2021 1106       RADIOGRAPHIC STUDIES: IR IMAGING GUIDED PORT INSERTION  Result Date: 06/29/2021 INDICATION: 45 year old with lung cancer.  Port-A-Cath needed for treatment. EXAM: FLUOROSCOPIC AND ULTRASOUND GUIDED PLACEMENT OF A SUBCUTANEOUS PORT COMPARISON:  None. MEDICATIONS: Moderate sedation ANESTHESIA/SEDATION: Versed 4.0 mg IV; Fentanyl 200 mcg IV; Moderate Sedation Time:  29 minutes The patient was continuously monitored during the procedure by the interventional radiology nurse under my direct supervision. FLUOROSCOPY TIME:  18 seconds, 2 mGy COMPLICATIONS: None immediate. PROCEDURE: The procedure, risks, benefits, and alternatives were explained to the patient. Questions regarding the procedure were encouraged  and answered. The patient understands and consents to the procedure. Patient was placed supine on the interventional table. Ultrasound confirmed a patent right internal jugular vein. Ultrasound image was saved for documentation. The right chest and neck were cleaned with a skin antiseptic and a sterile drape was placed. Maximal barrier sterile technique was utilized including caps, mask, sterile gowns, sterile gloves, sterile drape, hand hygiene and skin antiseptic. The right  neck was anesthetized with 1% lidocaine. Small incision was made in the right neck with a blade. Micropuncture set was placed in the right internal jugular vein with ultrasound guidance. The micropuncture wire was used for measurement purposes. The right chest was anesthetized with 1% lidocaine with epinephrine. #15 blade was used to make an incision and a subcutaneous port pocket was formed. [place] was assembled. Subcutaneous tunnel was formed with a stiff tunneling device. The port catheter was brought through the subcutaneous tunnel. The port was placed in the subcutaneous pocket. The micropuncture set was exchanged for a peel-away sheath. The catheter was placed through the peel-away sheath and the tip was positioned at the superior cavoatrial junction. Catheter placement was confirmed with fluoroscopy. The port was accessed and flushed with heparinized saline. The port pocket was closed using two layers of absorbable sutures and Dermabond. The vein skin site was closed using a single layer of absorbable suture and Dermabond. Sterile dressings were applied. Patient tolerated the procedure well without an immediate complication. Ultrasound and fluoroscopic images were taken and saved for this procedure. IMPRESSION: Placement of a subcutaneous power-injectable port device. Catheter tip at the superior cavoatrial junction. Electronically Signed   By: Markus Daft M.D.   On: 06/29/2021 10:43    ASSESSMENT AND PLAN: This is a very pleasant 45 years old white male recently diagnosed with a stage IV (T2b, N2, M1 B) non-small cell lung cancer, adenocarcinoma presented with large central left lower lobe lung mass in addition to left hilar and mediastinal lymphadenopathy as well as retroperitoneal abdominal lymph node diagnosed in May 2022.  The patient is currently undergoing palliative radiotherapy to the large left lower lobe lung mass under the care of of Dr. Lisbeth Renshaw.  He is expected to complete this course  of treatment on June 12, 2021. MRI of the brain showed no evidence of metastatic disease to the brain. His molecular studies by [facility] showed no actionable mutations and PD-L1 expression of 50%. He is currently undergoing systemic chemotherapy with carboplatin for AUC of 5, Alimta 500 Mg/M2 and Keytruda 200 Mg IV every 3 weeks.  Status post 1 cycle. He tolerated the first cycle of his treatment well with no concerning adverse effects. His back pain is much better and he is followed by neurosurgery. I recommended for him to proceed with cycle #2 today as planned. I will see him back for follow-up visit in 3 weeks for evaluation before the next cycle of his treatment. The patient was advised to call immediately if he has any concerning symptoms in the interval.  The patient voices understanding of current disease status and treatment options and is in agreement with the current care plan. All questions were answered. The patient knows to call the clinic with any problems, questions or concerns. We can certainly see the patient much sooner if necessary.  Disclaimer: This note was dictated with voice recognition software. Similar sounding words can inadvertently be transcribed and may not be corrected upon review.

## 2021-07-05 NOTE — Patient Instructions (Signed)
[facility]  Discharge Instructions: Thank you for choosing [facility] to provide your oncology and hematology care.   If you have a lab appointment with the [facility], please go directly to the [facility] and check in at the registration area.   Wear comfortable clothing and clothing appropriate for easy access to any Portacath or PICC line.   We strive to give you quality time with your provider. You may need to reschedule your appointment if you arrive late (15 or more minutes).  Arriving late affects you and other patients whose appointments are after yours.  Also, if you miss three or more appointments without notifying the office, you may be dismissed from the clinic at the provider's discretion.      For prescription refill requests, have your pharmacy contact our office and allow 72 hours for refills to be completed.    Today you received the following chemotherapy and/or immunotherapy agents : Keytruda, Carboplatin, Alimta      To help prevent nausea and vomiting after your treatment, we encourage you to take your nausea medication as directed.  BELOW ARE SYMPTOMS THAT SHOULD BE REPORTED IMMEDIATELY: *FEVER GREATER THAN 100.4 F (38 C) OR HIGHER *CHILLS OR SWEATING *NAUSEA AND VOMITING THAT IS NOT CONTROLLED WITH YOUR NAUSEA MEDICATION *UNUSUAL SHORTNESS OF BREATH *UNUSUAL BRUISING OR BLEEDING *URINARY PROBLEMS (pain or burning when urinating, or frequent urination) *BOWEL PROBLEMS (unusual diarrhea, constipation, pain near the anus) TENDERNESS IN MOUTH AND THROAT WITH OR WITHOUT PRESENCE OF ULCERS (sore throat, sores in mouth, or a toothache) UNUSUAL RASH, SWELLING OR PAIN  UNUSUAL VAGINAL DISCHARGE OR ITCHING   Items with * indicate a potential emergency and should be followed up as soon as possible or go to the Emergency Department if any problems should occur.  Please show the CHEMOTHERAPY ALERT CARD or IMMUNOTHERAPY ALERT  CARD at check-in to the Emergency Department and triage nurse.  Should you have questions after your visit or need to cancel or reschedule your appointment, please contact [facility]  Dept: [phone]  and follow the prompts.  Office hours are 8:00 a.m. to 4:30 p.m. Monday - Friday. Please note that voicemails left after 4:00 p.m. may not be returned until the following business day.  We are closed weekends and major holidays. You have access to a nurse at all times for urgent questions. Please call the main number to the clinic Dept: [phone] and follow the prompts.   For any non-urgent questions, you may also contact your provider using MyChart. We now offer e-Visits for anyone 48 and older to request care online for non-urgent symptoms. For details visit [email or web address].   Also download the MyChart app! Go to the app store, search "MyChart", open the app, select [facility], and log in with your MyChart username and password.  Due to Covid, a mask is required upon entering the hospital/clinic. If you do not have a mask, one will be given to you upon arrival. For doctor visits, patients may have 1 support person aged 75 or older with them. For treatment visits, patients cannot have anyone with them due to current Covid guidelines and our immunocompromised population.

## 2021-07-07 ENCOUNTER — Other Ambulatory Visit (HOSPITAL_COMMUNITY): Payer: Managed Care, Other (non HMO)

## 2021-07-12 ENCOUNTER — Inpatient Hospital Stay: Payer: Managed Care, Other (non HMO)

## 2021-07-12 ENCOUNTER — Other Ambulatory Visit: Payer: Self-pay

## 2021-07-12 ENCOUNTER — Other Ambulatory Visit: Payer: Managed Care, Other (non HMO)

## 2021-07-12 DIAGNOSIS — C3432 Malignant neoplasm of lower lobe, left bronchus or lung: Secondary | ICD-10-CM

## 2021-07-12 DIAGNOSIS — Z95828 Presence of other vascular implants and grafts: Secondary | ICD-10-CM

## 2021-07-12 DIAGNOSIS — Z5112 Encounter for antineoplastic immunotherapy: Secondary | ICD-10-CM | POA: Diagnosis not present

## 2021-07-12 LAB — CBC WITH DIFFERENTIAL (CANCER CENTER ONLY)
Abs Immature Granulocytes: 0 10*3/uL (ref 0.00–0.07)
Basophils Absolute: 0 10*3/uL (ref 0.0–0.1)
Basophils Relative: 1 %
Eosinophils Absolute: 0.1 10*3/uL (ref 0.0–0.5)
Eosinophils Relative: 4 %
HCT: 32.3 % — ABNORMAL LOW (ref 39.0–52.0)
Hemoglobin: 11.1 g/dL — ABNORMAL LOW (ref 13.0–17.0)
Immature Granulocytes: 0 %
Lymphocytes Relative: 32 %
Lymphs Abs: 0.5 10*3/uL — ABNORMAL LOW (ref 0.7–4.0)
MCH: 30 pg (ref 26.0–34.0)
MCHC: 34.4 g/dL (ref 30.0–36.0)
MCV: 87.3 fL (ref 80.0–100.0)
Monocytes Absolute: 0.1 10*3/uL (ref 0.1–1.0)
Monocytes Relative: 6 %
Neutro Abs: 0.8 10*3/uL — ABNORMAL LOW (ref 1.7–7.7)
Neutrophils Relative %: 57 %
Platelet Count: 240 10*3/uL (ref 150–400)
RBC: 3.7 MIL/uL — ABNORMAL LOW (ref 4.22–5.81)
RDW: 13.2 % (ref 11.5–15.5)
WBC Count: 1.5 10*3/uL — ABNORMAL LOW (ref 4.0–10.5)
nRBC: 0 % (ref 0.0–0.2)

## 2021-07-12 LAB — CMP (CANCER CENTER ONLY)
ALT: 25 U/L (ref 0–44)
AST: 17 U/L (ref 15–41)
Albumin: 3.3 g/dL — ABNORMAL LOW (ref 3.5–5.0)
Alkaline Phosphatase: 93 U/L (ref 38–126)
Anion gap: 9 (ref 5–15)
BUN: 15 mg/dL (ref 6–20)
CO2: 26 mmol/L (ref 22–32)
Calcium: 9.4 mg/dL (ref 8.9–10.3)
Chloride: 105 mmol/L (ref 98–111)
Creatinine: 0.82 mg/dL (ref 0.61–1.24)
GFR, Estimated: >60 mL/min (ref >60)
Glucose, Bld: 134 mg/dL — ABNORMAL HIGH (ref 70–99)
Potassium: 3.9 mmol/L (ref 3.5–5.1)
Sodium: 140 mmol/L (ref 135–145)
Total Bilirubin: 0.3 mg/dL (ref 0.3–1.2)
Total Protein: 6.7 g/dL (ref 6.5–8.1)

## 2021-07-12 MED ORDER — SODIUM CHLORIDE 0.9% FLUSH
10.0000 mL | INTRAVENOUS | Status: AC | PRN
  Administered 2021-07-12: 10 mL
  Filled 2021-07-12: qty 10

## 2021-07-12 MED ORDER — HEPARIN SOD (PORK) LOCK FLUSH 100 UNIT/ML IV SOLN
500.0000 [IU] | INTRAVENOUS | Status: AC | PRN
  Administered 2021-07-12: 500 [IU]
  Filled 2021-07-12: qty 5

## 2021-07-13 ENCOUNTER — Encounter: Payer: Self-pay | Admitting: Internal Medicine

## 2021-07-13 ENCOUNTER — Telehealth: Payer: Self-pay | Admitting: Medical Oncology

## 2021-07-13 DIAGNOSIS — R21 Rash and other nonspecific skin eruption: Secondary | ICD-10-CM

## 2021-07-13 MED ORDER — METHYLPREDNISOLONE 4 MG PO TBPK: ORAL_TABLET | ORAL | 0 refills | Status: DC

## 2021-07-13 NOTE — Telephone Encounter (Signed)
Mouth feels like sandpaper and his gums are bleeding  only when he brushes his teeth.  His skin is breaking out 2-3 places on stomach and side of knees where his excema appears but he describes it as darker with redness, bumps, itchy.  For the mouth -Per Dr Julien Nordmann I instructed pt to start Biotene mouth rinse and salt water in between .  Skin-eruption- Dr Julien Nordmann ordered a medrol dose pak.  Port a cath and golfing. Asking if playing golf will interfere with port functioning. I told him that it should not and he can play golf.

## 2021-07-13 NOTE — Telephone Encounter (Signed)
Error

## 2021-07-17 ENCOUNTER — Ambulatory Visit
Admission: RE | Admit: 2021-07-17 | Discharge: 2021-07-17 | Disposition: A | Discharge status: Home / Self Care | Payer: Managed Care, Other (non HMO) | Source: Ambulatory Visit | Attending: Internal Medicine | Admitting: Internal Medicine

## 2021-07-17 DIAGNOSIS — C3432 Malignant neoplasm of lower lobe, left bronchus or lung: Secondary | ICD-10-CM

## 2021-07-17 NOTE — Progress Notes (Signed)
  Radiation Oncology         [phone]) [phone] ________________________________  Name: Cameron Harper MRN: [id number]  Date of Service: 07/17/2021  DOB: [birth year]  Post Treatment Telephone Note  Diagnosis:   Stage IV, cT2N1M1b,NSCLC, poorly differentiated adenocarcinoma of the LLL  Interval Since Last Radiation:  5 weeks   05/22/2021 through 06/12/2021 Site Technique Total Dose (Gy) Dose per Fx (Gy) Completed Fx Beam Energies  Lung, Left: Lung_Lt 3D 37.5/37.5 2.5 15/15 6X, 10X, 15X    Narrative:  The patient was contacted today for routine follow-up. During treatment he did very well with radiotherapy and did not have significant desquamation. He reports he is fatigued from chemotherapy but that the second treatment was more tolerable than the first. His breathing is stable but his coughing has also improved since radiation.   Impression/Plan: 1. Stage IV, cT2N1M1b,NSCLC, poorly differentiated adenocarcinoma of the LLL. The patient has been doing well since completion of radiotherapy. We discussed that we would be happy to continue to follow him as needed, but he will also continue to follow up with Dr. Julien Nordmann in medical oncology.      Carola Rhine, PAC

## 2021-07-18 ENCOUNTER — Telehealth: Payer: Self-pay

## 2021-07-18 NOTE — Telephone Encounter (Signed)
Pt Cameron Harper stating his "vein" where he received his first chemo tx.   Discussed with Dr. Julien Nordmann who advised for pt to take some Tylenol and use warm compresses. He states this is normal and is from his chemo tx.  I have spoken with the pt and his wife and advised as indicated. They expressed understanding of this information.

## 2021-07-19 ENCOUNTER — Encounter: Payer: Self-pay | Admitting: Internal Medicine

## 2021-07-19 ENCOUNTER — Other Ambulatory Visit: Payer: Self-pay

## 2021-07-19 ENCOUNTER — Other Ambulatory Visit: Payer: Managed Care, Other (non HMO)

## 2021-07-19 ENCOUNTER — Inpatient Hospital Stay: Payer: Managed Care, Other (non HMO)

## 2021-07-19 ENCOUNTER — Telehealth: Payer: Self-pay | Admitting: Medical Oncology

## 2021-07-19 DIAGNOSIS — Z95828 Presence of other vascular implants and grafts: Secondary | ICD-10-CM

## 2021-07-19 DIAGNOSIS — C3432 Malignant neoplasm of lower lobe, left bronchus or lung: Secondary | ICD-10-CM

## 2021-07-19 DIAGNOSIS — Z5112 Encounter for antineoplastic immunotherapy: Secondary | ICD-10-CM | POA: Diagnosis not present

## 2021-07-19 LAB — CBC WITH DIFFERENTIAL (CANCER CENTER ONLY)
Abs Immature Granulocytes: 0 10*3/uL (ref 0.00–0.07)
Basophils Absolute: 0 10*3/uL (ref 0.0–0.1)
Basophils Relative: 0 %
Eosinophils Absolute: 0.1 10*3/uL (ref 0.0–0.5)
Eosinophils Relative: 6 %
HCT: 30.6 % — ABNORMAL LOW (ref 39.0–52.0)
Hemoglobin: 10.5 g/dL — ABNORMAL LOW (ref 13.0–17.0)
Immature Granulocytes: 0 %
Lymphocytes Relative: 32 %
Lymphs Abs: 0.6 10*3/uL — ABNORMAL LOW (ref 0.7–4.0)
MCH: 30.4 pg (ref 26.0–34.0)
MCHC: 34.3 g/dL (ref 30.0–36.0)
MCV: 88.7 fL (ref 80.0–100.0)
Monocytes Absolute: 0.5 10*3/uL (ref 0.1–1.0)
Monocytes Relative: 29 %
Neutro Abs: 0.6 10*3/uL — ABNORMAL LOW (ref 1.7–7.7)
Neutrophils Relative %: 33 %
Platelet Count: 81 10*3/uL — ABNORMAL LOW (ref 150–400)
RBC: 3.45 MIL/uL — ABNORMAL LOW (ref 4.22–5.81)
RDW: 13.2 % (ref 11.5–15.5)
WBC Count: 1.9 10*3/uL — ABNORMAL LOW (ref 4.0–10.5)
nRBC: 0 % (ref 0.0–0.2)

## 2021-07-19 LAB — CMP (CANCER CENTER ONLY)
ALT: 33 U/L (ref 0–44)
AST: 20 U/L (ref 15–41)
Albumin: 3.4 g/dL — ABNORMAL LOW (ref 3.5–5.0)
Alkaline Phosphatase: 86 U/L (ref 38–126)
Anion gap: 10 (ref 5–15)
BUN: 10 mg/dL (ref 6–20)
CO2: 26 mmol/L (ref 22–32)
Calcium: 9.3 mg/dL (ref 8.9–10.3)
Chloride: 109 mmol/L (ref 98–111)
Creatinine: 1.01 mg/dL (ref 0.61–1.24)
GFR, Estimated: >60 mL/min (ref >60)
Glucose, Bld: 99 mg/dL (ref 70–99)
Potassium: 3.9 mmol/L (ref 3.5–5.1)
Sodium: 145 mmol/L (ref 135–145)
Total Bilirubin: 0.3 mg/dL (ref 0.3–1.2)
Total Protein: 6.9 g/dL (ref 6.5–8.1)

## 2021-07-19 MED ORDER — HEPARIN SOD (PORK) LOCK FLUSH 100 UNIT/ML IV SOLN
500.0000 [IU] | INTRAVENOUS | Status: AC | PRN
  Administered 2021-07-19: 500 [IU]

## 2021-07-19 MED ORDER — SODIUM CHLORIDE 0.9% FLUSH
10.0000 mL | INTRAVENOUS | Status: AC | PRN
  Administered 2021-07-19: 10 mL

## 2021-07-19 NOTE — Progress Notes (Signed)
Met w/ pt to discuss copay assistance.  Pt thinks his insurance should pay for his treatment at 100% so we'll wait to see if his ins leaves him a balance for treatment, if so, we will apply for copay assistance at that time.  I also informed him of the [employer] but he's overqualified for that grant.  He has my card for any questions or concerns he may have in the future.

## 2021-07-19 NOTE — Telephone Encounter (Signed)
Disability form placed in Roz/Jeri"s folder.

## 2021-07-21 ENCOUNTER — Telehealth: Payer: Self-pay

## 2021-07-21 NOTE — Telephone Encounter (Signed)
I have spoke with the pt who states the "soreness" was in his right calf and has a "pencil eraser size bruise" on it. Pt denies fever, chills, swelling, no hardness, and the area is not hot to the touch. Pt states he did a lot of yard work yesterday, so it is possible something may have bumped or hit him during that time. He acknowledges Tylenol helped.  I have discussed this with Dr. Alen Blew and he has no concerns of a blood clot.  I have called the pt back and advised of this. Pt expressed understanding of this.

## 2021-07-21 NOTE — Telephone Encounter (Signed)
Pts wife LM stating pt is c/o calf pain with a bruised area.

## 2021-07-27 ENCOUNTER — Other Ambulatory Visit: Payer: Self-pay

## 2021-07-27 ENCOUNTER — Inpatient Hospital Stay: Payer: Managed Care, Other (non HMO)

## 2021-07-27 ENCOUNTER — Encounter: Payer: Self-pay | Admitting: Internal Medicine

## 2021-07-27 ENCOUNTER — Inpatient Hospital Stay (HOSPITAL_BASED_OUTPATIENT_CLINIC_OR_DEPARTMENT_OTHER): Payer: Managed Care, Other (non HMO) | Admitting: Internal Medicine

## 2021-07-27 ENCOUNTER — Other Ambulatory Visit: Payer: Managed Care, Other (non HMO)

## 2021-07-27 VITALS — BP 125/75 | HR 79 | Temp 97.4°F | Resp 19 | Ht 69.0 in | Wt 164.3 lb

## 2021-07-27 DIAGNOSIS — C3432 Malignant neoplasm of lower lobe, left bronchus or lung: Secondary | ICD-10-CM

## 2021-07-27 DIAGNOSIS — Z95828 Presence of other vascular implants and grafts: Secondary | ICD-10-CM | POA: Insufficient documentation

## 2021-07-27 DIAGNOSIS — Z5112 Encounter for antineoplastic immunotherapy: Secondary | ICD-10-CM

## 2021-07-27 DIAGNOSIS — Z5111 Encounter for antineoplastic chemotherapy: Secondary | ICD-10-CM

## 2021-07-27 DIAGNOSIS — C3490 Malignant neoplasm of unspecified part of unspecified bronchus or lung: Secondary | ICD-10-CM

## 2021-07-27 LAB — CBC WITH DIFFERENTIAL (CANCER CENTER ONLY)
Abs Immature Granulocytes: 0.04 10*3/uL (ref 0.00–0.07)
Basophils Absolute: 0 10*3/uL (ref 0.0–0.1)
Basophils Relative: 1 %
Eosinophils Absolute: 0.1 10*3/uL (ref 0.0–0.5)
Eosinophils Relative: 3 %
HCT: 31.2 % — ABNORMAL LOW (ref 39.0–52.0)
Hemoglobin: 10.3 g/dL — ABNORMAL LOW (ref 13.0–17.0)
Immature Granulocytes: 1 %
Lymphocytes Relative: 23 %
Lymphs Abs: 0.8 10*3/uL (ref 0.7–4.0)
MCH: 30.2 pg (ref 26.0–34.0)
MCHC: 33 g/dL (ref 30.0–36.0)
MCV: 91.5 fL (ref 80.0–100.0)
Monocytes Absolute: 0.6 10*3/uL (ref 0.1–1.0)
Monocytes Relative: 19 %
Neutro Abs: 1.8 10*3/uL (ref 1.7–7.7)
Neutrophils Relative %: 53 %
Platelet Count: 258 10*3/uL (ref 150–400)
RBC: 3.41 MIL/uL — ABNORMAL LOW (ref 4.22–5.81)
RDW: 15.2 % (ref 11.5–15.5)
WBC Count: 3.4 10*3/uL — ABNORMAL LOW (ref 4.0–10.5)
nRBC: 0 % (ref 0.0–0.2)

## 2021-07-27 LAB — CMP (CANCER CENTER ONLY)
ALT: 35 U/L (ref 0–44)
AST: 21 U/L (ref 15–41)
Albumin: 3.5 g/dL (ref 3.5–5.0)
Alkaline Phosphatase: 81 U/L (ref 38–126)
Anion gap: 8 (ref 5–15)
BUN: 7 mg/dL (ref 6–20)
CO2: 27 mmol/L (ref 22–32)
Calcium: 9.2 mg/dL (ref 8.9–10.3)
Chloride: 108 mmol/L (ref 98–111)
Creatinine: 0.97 mg/dL (ref 0.61–1.24)
GFR, Estimated: >60 mL/min (ref >60)
Glucose, Bld: 103 mg/dL — ABNORMAL HIGH (ref 70–99)
Potassium: 4 mmol/L (ref 3.5–5.1)
Sodium: 143 mmol/L (ref 135–145)
Total Bilirubin: 0.2 mg/dL — ABNORMAL LOW (ref 0.3–1.2)
Total Protein: 6.9 g/dL (ref 6.5–8.1)

## 2021-07-27 LAB — TSH: TSH: 0.92 u[IU]/mL (ref 0.320–4.118)

## 2021-07-27 MED ORDER — SODIUM CHLORIDE 0.9% FLUSH
10.0000 mL | INTRAVENOUS | Status: DC | PRN
  Administered 2021-07-27: 10 mL

## 2021-07-27 MED ORDER — SODIUM CHLORIDE 0.9 % IV SOLN
500.0000 mg/m2 | Freq: Once | INTRAVENOUS | Status: AC
  Administered 2021-07-27: 1000 mg via INTRAVENOUS
  Filled 2021-07-27: qty 40

## 2021-07-27 MED ORDER — CYANOCOBALAMIN 1000 MCG/ML IJ SOLN
1000.0000 ug | Freq: Once | INTRAMUSCULAR | Status: AC
  Administered 2021-07-27: 1000 ug via INTRAMUSCULAR
  Filled 2021-07-27: qty 1

## 2021-07-27 MED ORDER — SODIUM CHLORIDE 0.9% FLUSH
10.0000 mL | Freq: Once | INTRAVENOUS | Status: AC
Start: 2021-07-27 — End: 2021-07-27
  Administered 2021-07-27: 10 mL

## 2021-07-27 MED ORDER — PALONOSETRON HCL INJECTION 0.25 MG/5ML
0.2500 mg | Freq: Once | INTRAVENOUS | Status: AC
  Administered 2021-07-27: 0.25 mg via INTRAVENOUS
  Filled 2021-07-27: qty 5

## 2021-07-27 MED ORDER — SODIUM CHLORIDE 0.9 % IV SOLN
613.0000 mg | Freq: Once | INTRAVENOUS | Status: AC
  Administered 2021-07-27: 610 mg via INTRAVENOUS
  Filled 2021-07-27: qty 61

## 2021-07-27 MED ORDER — SODIUM CHLORIDE 0.9 % IV SOLN
10.0000 mg | Freq: Once | INTRAVENOUS | Status: AC
  Administered 2021-07-27: 10 mg via INTRAVENOUS
  Filled 2021-07-27: qty 10

## 2021-07-27 MED ORDER — SODIUM CHLORIDE 0.9 % IV SOLN
150.0000 mg | Freq: Once | INTRAVENOUS | Status: AC
  Administered 2021-07-27: 150 mg via INTRAVENOUS
  Filled 2021-07-27: qty 150

## 2021-07-27 MED ORDER — HEPARIN SOD (PORK) LOCK FLUSH 100 UNIT/ML IV SOLN
500.0000 [IU] | Freq: Once | INTRAVENOUS | Status: AC | PRN
  Administered 2021-07-27: 500 [IU]

## 2021-07-27 MED ORDER — SODIUM CHLORIDE 0.9 % IV SOLN
200.0000 mg | Freq: Once | INTRAVENOUS | Status: AC
  Administered 2021-07-27: 200 mg via INTRAVENOUS
  Filled 2021-07-27: qty 8

## 2021-07-27 MED ORDER — SODIUM CHLORIDE 0.9 % IV SOLN: Freq: Once | INTRAVENOUS | Status: AC

## 2021-07-27 NOTE — Patient Instructions (Signed)
[facility]   Discharge Instructions: Thank you for choosing [facility] to provide your oncology and hematology care.   If you have a lab appointment with the [facility], please go directly to the [facility] and check in at the registration area.   Wear comfortable clothing and clothing appropriate for easy access to any Portacath or PICC line.   We strive to give you quality time with your provider. You may need to reschedule your appointment if you arrive late (15 or more minutes).  Arriving late affects you and other patients whose appointments are after yours.  Also, if you miss three or more appointments without notifying the office, you may be dismissed from the clinic at the provider's discretion.      For prescription refill requests, have your pharmacy contact our office and allow 72 hours for refills to be completed.    Today you received the following chemotherapy and/or immunotherapy agents: Pembrolizumab (Keytruda), Pemetrexed (Alimta), and Carboplatin       To help prevent nausea and vomiting after your treatment, we encourage you to take your nausea medication as directed.  BELOW ARE SYMPTOMS THAT SHOULD BE REPORTED IMMEDIATELY: *FEVER GREATER THAN 100.4 F (38 C) OR HIGHER *CHILLS OR SWEATING *NAUSEA AND VOMITING THAT IS NOT CONTROLLED WITH YOUR NAUSEA MEDICATION *UNUSUAL SHORTNESS OF BREATH *UNUSUAL BRUISING OR BLEEDING *URINARY PROBLEMS (pain or burning when urinating, or frequent urination) *BOWEL PROBLEMS (unusual diarrhea, constipation, pain near the anus) TENDERNESS IN MOUTH AND THROAT WITH OR WITHOUT PRESENCE OF ULCERS (sore throat, sores in mouth, or a toothache) UNUSUAL RASH, SWELLING OR PAIN  UNUSUAL VAGINAL DISCHARGE OR ITCHING   Items with * indicate a potential emergency and should be followed up as soon as possible or go to the Emergency Department if any problems should occur.  Please show the  CHEMOTHERAPY ALERT CARD or IMMUNOTHERAPY ALERT CARD at check-in to the Emergency Department and triage nurse.  Should you have questions after your visit or need to cancel or reschedule your appointment, please contact [facility]  Dept: [phone]  and follow the prompts.  Office hours are 8:00 a.m. to 4:30 p.m. Monday - Friday. Please note that voicemails left after 4:00 p.m. may not be returned until the following business day.  We are closed weekends and major holidays. You have access to a nurse at all times for urgent questions. Please call the main number to the clinic Dept: [phone] and follow the prompts.   For any non-urgent questions, you may also contact your provider using MyChart. We now offer e-Visits for anyone 32 and older to request care online for non-urgent symptoms. For details visit [email or web address].   Also download the MyChart app! Go to the app store, search "MyChart", open the app, select [facility], and log in with your MyChart username and password.  Due to Covid, a mask is required upon entering the hospital/clinic. If you do not have a mask, one will be given to you upon arrival. For doctor visits, patients may have 1 support person aged 71 or older with them. For treatment visits, patients cannot have anyone with them due to current Covid guidelines and our immunocompromised population.

## 2021-07-27 NOTE — Progress Notes (Signed)
[name] [phone]   [phone]  OFFICE PROGRESS NOTE  [name], MD [place] [address]  DIAGNOSIS:  Stage IV (T2b, N2, M1 B) non-small cell lung cancer, adenocarcinoma presented with large central left lower lobe lung mass with left hilar and mediastinal lymphadenopathy as well as abdominal retroperitoneal lymphadenopathy diagnosed in May 2022.  The patient also has bilateral parotid gland nodules that need close monitoring.  [facility] MOLECULAR STUDY: Results with Therapy Associations BIOMARKER METHOD ANALYTE RESULT THERAPY ASSOCIATION BIOMARKER LEVEL* .PD-L1 (22c3) IHC Protein Positive, TPS: 50% BENEFIT cemiplimab, pembrolizumab Level 1 .PD-L1 (28-8) IHC Protein Positive  1+, 50% BENEFIT nivolumab/ipilimumab combination Level 1 .TMB Seq DNA-Tumor High, 18 mut/Mb BENEFIT pembrolizumab Level 2 . alectinib, ceritinib, crizotinib, lorlatinib Level 1 . IHC Protein Negative  0 brigatinib Level 2 . ALK Seq RNA-Tumor Fusion Not Detected LACK OF BENEFIT alectinib, brigatinib, ceritinib, crizotinib, lorlatinib Level 2 .BRAF Seq DNA-Tumor Mutation Not Detected LACK OF BENEFIT dabrafenib and trametinib combination therapy, vemurafenib Level 2 .EGFR Seq DNA-Tumor Mutation Not Detected LACK OF BENEFIT erlotinib, gefitinib Level 2 .KRAS Seq DNA-Tumor Mutation Not Detected LACK OF BENEFIT sotorasib Level 2 .RET Seq RNA-Tumor Fusion Not Detected LACK OF BENEFIT pralsetinib, selpercatinib Level 2 .ROS1 Seq RNA-Tumor Fusion Not Detected LACK OF BENEFIT ceritinib, crizotinib, entrectinib, lorlatinib Level 2 . CNA-Seq DNA-Tumor Amplification Not Detected . MET Seq RNA-Tumor Variant Transcript Not Detected LACK OF BENEFIT crizotinib Level 3  PRIOR THERAPY: Palliative radiotherapy to the large central left lower lobe lung mass under the care of Dr. Lisbeth Renshaw.  CURRENT THERAPY: Systemic chemotherapy with carboplatin  for AUC of 5, Alimta 500 Mg/M2 and possibly Keytruda 200 Mg IV every 3 weeks.  First dose expected June 14, 2021.  Status post 2 cycles of treatment.  INTERVAL HISTORY: Cameron Harper 45 y.o. male returns to the clinic today for follow-up visit.  The patient is feeling fine today with no concerning complaints except for mild aching pain on the right calf but no swelling or erythema.  He was very active over the weekend and mow the grass and did a lot of yard work.  He has a history of low back pain with radiation to the legs.  He denied having any current chest pain, shortness of breath, cough or hemoptysis.  He denied having any fever or chills.  He has no nausea, vomiting, diarrhea or constipation.  He is here today for evaluation before starting cycle #3 of his treatment.  MEDICAL HISTORY: Past Medical History:  Diagnosis Date   Chronic back pain    Eczema    GERD (gastroesophageal reflux disease)    Lung cancer (HCC)     ALLERGIES:  is allergic to penicillins.  MEDICATIONS:  Current Outpatient Medications  Medication Sig Dispense Refill   acetaminophen (TYLENOL) 500 MG tablet Take 500-1,000 mg by mouth every 6 (six) hours as needed (for back pain.).     folic acid (FOLVITE) 1 MG tablet Take 1 tablet (1 mg total) by mouth daily. 30 tablet 4   lidocaine-prilocaine (EMLA) cream Apply 1 application topically as needed. 30 g 2   methylPREDNISolone (MEDROL DOSEPAK) 4 MG TBPK tablet Take per package instructions. 21 tablet 0   prochlorperazine (COMPAZINE) 10 MG tablet Take 1 tablet (10 mg total) by mouth every 6 (six) hours as needed for nausea or vomiting. 30 tablet 0   traMADol (ULTRAM) 50 MG tablet Take 50 mg by mouth every 6 (six)  hours as needed for severe pain.     zolpidem (AMBIEN) 10 MG tablet Take 5-10 mg by mouth at bedtime as needed for sleep.     No current facility-administered medications for this visit.    SURGICAL HISTORY:  Past Surgical History:  Procedure Laterality  Date   BRONCHIAL BRUSHINGS  05/12/2021   Procedure: BRONCHIAL BRUSHINGS;  Surgeon: Garner Nash, DO;  Location: [facility];  Service: Pulmonary;;   BRONCHIAL DILITATION  05/12/2021   Procedure: BRONCHIAL DILITATION;  Surgeon: Garner Nash, DO;  Location: [facility];  Service: Pulmonary;;   BRONCHIAL NEEDLE ASPIRATION BIOPSY  05/12/2021   Procedure: BRONCHIAL NEEDLE ASPIRATION BIOPSIES;  Surgeon: Garner Nash, DO;  Location: [facility];  Service: Pulmonary;;   BRONCHIAL WASHINGS  05/12/2021   Procedure: BRONCHIAL WASHINGS;  Surgeon: Garner Nash, DO;  Location: [facility];  Service: Pulmonary;;   CRYOTHERAPY  05/12/2021   Procedure: CRYOTHERAPY;  Surgeon: Garner Nash, DO;  Location: [facility];  Service: Pulmonary;;   HEMOSTASIS CONTROL  05/12/2021   Procedure: HEMOSTASIS CONTROL;  Surgeon: Garner Nash, DO;  Location: [facility];  Service: Pulmonary;;  epi injection   IR IMAGING GUIDED PORT INSERTION  06/29/2021   Spine injection     Pain control   VIDEO BRONCHOSCOPY WITH ENDOBRONCHIAL ULTRASOUND N/A 05/12/2021   Procedure: VIDEO BRONCHOSCOPY WITH ENDOBRONCHIAL ULTRASOUND;  Surgeon: Garner Nash, DO;  Location: [facility];  Service: Pulmonary;  Laterality: N/A;   WISDOM TOOTH EXTRACTION     no anesthesia involed    REVIEW OF SYSTEMS:  A comprehensive review of systems was negative except for: Musculoskeletal: positive for mild pain in the right calf.    PHYSICAL EXAMINATION: General appearance: alert, cooperative, and no distress Head: Normocephalic, without obvious abnormality, atraumatic Neck: no adenopathy, no JVD, supple, symmetrical, trachea midline, and thyroid not enlarged, symmetric, no tenderness/mass/nodules Lymph nodes: Cervical, supraclavicular, and axillary nodes normal. Resp: clear to auscultation bilaterally Back: symmetric, no curvature. ROM normal. No CVA tenderness. Cardio: regular rate and rhythm, S1, S2 normal, no murmur, click, rub  or gallop GI: soft, non-tender; bowel sounds normal; no masses,  no organomegaly Extremities: extremities normal, atraumatic, no cyanosis or edema  ECOG PERFORMANCE STATUS: 1 - Symptomatic but completely ambulatory  Blood pressure 125/75, pulse 79, temperature (!) 97.4 F (36.3 C), temperature source Tympanic, resp. rate 19, height _0  (1.753 m), weight 164 lb 4.8 oz (74.5 kg), SpO2 100 %.  LABORATORY DATA: Lab Results  Component Value Date   WBC 3.4 (L) 07/27/2021   HGB 10.3 (L) 07/27/2021   HCT 31.2 (L) 07/27/2021   MCV 91.5 07/27/2021   PLT 258 07/27/2021      Chemistry      Component Value Date/Time   NA 145 07/19/2021 1045   K 3.9 07/19/2021 1045   CL 109 07/19/2021 1045   CO2 26 07/19/2021 1045   BUN 10 07/19/2021 1045   CREATININE 1.01 07/19/2021 1045      Component Value Date/Time   CALCIUM 9.3 07/19/2021 1045   ALKPHOS 86 07/19/2021 1045   AST 20 07/19/2021 1045   ALT 33 07/19/2021 1045   BILITOT 0.3 07/19/2021 1045       RADIOGRAPHIC STUDIES: IR IMAGING GUIDED PORT INSERTION  Result Date: 06/29/2021 INDICATION: 45 year old with lung cancer.  Port-A-Cath needed for treatment. EXAM: FLUOROSCOPIC AND ULTRASOUND GUIDED PLACEMENT OF A SUBCUTANEOUS PORT COMPARISON:  None. MEDICATIONS: Moderate sedation ANESTHESIA/SEDATION: Versed 4.0 mg IV; Fentanyl 200 mcg IV; Moderate  Sedation Time:  29 minutes The patient was continuously monitored during the procedure by the interventional radiology nurse under my direct supervision. FLUOROSCOPY TIME:  18 seconds, 2 mGy COMPLICATIONS: None immediate. PROCEDURE: The procedure, risks, benefits, and alternatives were explained to the patient. Questions regarding the procedure were encouraged and answered. The patient understands and consents to the procedure. Patient was placed supine on the interventional table. Ultrasound confirmed a patent right internal jugular vein. Ultrasound image was saved for documentation. The right chest  and neck were cleaned with a skin antiseptic and a sterile drape was placed. Maximal barrier sterile technique was utilized including caps, mask, sterile gowns, sterile gloves, sterile drape, hand hygiene and skin antiseptic. The right neck was anesthetized with 1% lidocaine. Small incision was made in the right neck with a blade. Micropuncture set was placed in the right internal jugular vein with ultrasound guidance. The micropuncture wire was used for measurement purposes. The right chest was anesthetized with 1% lidocaine with epinephrine. #15 blade was used to make an incision and a subcutaneous port pocket was formed. [place] was assembled. Subcutaneous tunnel was formed with a stiff tunneling device. The port catheter was brought through the subcutaneous tunnel. The port was placed in the subcutaneous pocket. The micropuncture set was exchanged for a peel-away sheath. The catheter was placed through the peel-away sheath and the tip was positioned at the superior cavoatrial junction. Catheter placement was confirmed with fluoroscopy. The port was accessed and flushed with heparinized saline. The port pocket was closed using two layers of absorbable sutures and Dermabond. The vein skin site was closed using a single layer of absorbable suture and Dermabond. Sterile dressings were applied. Patient tolerated the procedure well without an immediate complication. Ultrasound and fluoroscopic images were taken and saved for this procedure. IMPRESSION: Placement of a subcutaneous power-injectable port device. Catheter tip at the superior cavoatrial junction. Electronically Signed   By: Markus Daft M.D.   On: 06/29/2021 10:43    ASSESSMENT AND PLAN: This is a very pleasant 45 years old white male recently diagnosed with a stage IV (T2b, N2, M1 B) non-small cell lung cancer, adenocarcinoma presented with large central left lower lobe lung mass in addition to left hilar and mediastinal lymphadenopathy as  well as retroperitoneal abdominal lymph node diagnosed in May 2022.  The patient is currently undergoing palliative radiotherapy to the large left lower lobe lung mass under the care of of Dr. Lisbeth Renshaw.  He is expected to complete this course of treatment on June 12, 2021. MRI of the brain showed no evidence of metastatic disease to the brain. His molecular studies by [facility] showed no actionable mutations and PD-L1 expression of 50%. He is currently undergoing systemic chemotherapy with carboplatin for AUC of 5, Alimta 500 Mg/M2 and Keytruda 200 Mg IV every 3 weeks.  Status post  2 cycles. The patient continues to tolerate this treatment well with no concerning adverse effects. I recommended for him to proceed with cycle #3 today as planned. I will see him back for follow-up visit in 3 weeks for evaluation with repeat CT scan of the chest, abdomen and pelvis for restaging of his disease. For the right calf pain, there is no evidence of swelling or erythema in that area and the patient was advised to monitor it closely and if it is getting worse we will consider him for Doppler of the lower extremity to rule out deep venous thrombosis. He was advised to call immediately if  he has any other concerning symptoms in the interval. The patient voices understanding of current disease status and treatment options and is in agreement with the current care plan. All questions were answered. The patient knows to call the clinic with any problems, questions or concerns. We can certainly see the patient much sooner if necessary.  Disclaimer: This note was dictated with voice recognition software. Similar sounding words can inadvertently be transcribed and may not be corrected upon review.

## 2021-08-02 ENCOUNTER — Inpatient Hospital Stay: Payer: Managed Care, Other (non HMO)

## 2021-08-02 ENCOUNTER — Other Ambulatory Visit: Payer: Self-pay

## 2021-08-02 DIAGNOSIS — Z95828 Presence of other vascular implants and grafts: Secondary | ICD-10-CM

## 2021-08-02 DIAGNOSIS — C3432 Malignant neoplasm of lower lobe, left bronchus or lung: Secondary | ICD-10-CM

## 2021-08-02 DIAGNOSIS — Z5112 Encounter for antineoplastic immunotherapy: Secondary | ICD-10-CM | POA: Diagnosis not present

## 2021-08-02 LAB — CMP (CANCER CENTER ONLY)
ALT: 31 U/L (ref 0–44)
AST: 23 U/L (ref 15–41)
Albumin: 3.7 g/dL (ref 3.5–5.0)
Alkaline Phosphatase: 80 U/L (ref 38–126)
Anion gap: 10 (ref 5–15)
BUN: 13 mg/dL (ref 6–20)
CO2: 25 mmol/L (ref 22–32)
Calcium: 9.2 mg/dL (ref 8.9–10.3)
Chloride: 104 mmol/L (ref 98–111)
Creatinine: 0.88 mg/dL (ref 0.61–1.24)
GFR, Estimated: >60 mL/min (ref >60)
Glucose, Bld: 96 mg/dL (ref 70–99)
Potassium: 4.3 mmol/L (ref 3.5–5.1)
Sodium: 139 mmol/L (ref 135–145)
Total Bilirubin: 0.4 mg/dL (ref 0.3–1.2)
Total Protein: 7.2 g/dL (ref 6.5–8.1)

## 2021-08-02 LAB — CBC WITH DIFFERENTIAL (CANCER CENTER ONLY)
Abs Immature Granulocytes: 0.01 10*3/uL (ref 0.00–0.07)
Basophils Absolute: 0 10*3/uL (ref 0.0–0.1)
Basophils Relative: 1 %
Eosinophils Absolute: 0.1 10*3/uL (ref 0.0–0.5)
Eosinophils Relative: 2 %
HCT: 29.3 % — ABNORMAL LOW (ref 39.0–52.0)
Hemoglobin: 10 g/dL — ABNORMAL LOW (ref 13.0–17.0)
Immature Granulocytes: 0 %
Lymphocytes Relative: 23 %
Lymphs Abs: 0.6 10*3/uL — ABNORMAL LOW (ref 0.7–4.0)
MCH: 31 pg (ref 26.0–34.0)
MCHC: 34.1 g/dL (ref 30.0–36.0)
MCV: 90.7 fL (ref 80.0–100.0)
Monocytes Absolute: 0.2 10*3/uL (ref 0.1–1.0)
Monocytes Relative: 7 %
Neutro Abs: 1.7 10*3/uL (ref 1.7–7.7)
Neutrophils Relative %: 67 %
Platelet Count: 321 10*3/uL (ref 150–400)
RBC: 3.23 MIL/uL — ABNORMAL LOW (ref 4.22–5.81)
RDW: 15 % (ref 11.5–15.5)
WBC Count: 2.6 10*3/uL — ABNORMAL LOW (ref 4.0–10.5)
nRBC: 0 % (ref 0.0–0.2)

## 2021-08-02 MED ORDER — SODIUM CHLORIDE 0.9% FLUSH
10.0000 mL | Freq: Once | INTRAVENOUS | Status: AC
  Administered 2021-08-02: 10 mL

## 2021-08-02 MED ORDER — HEPARIN SOD (PORK) LOCK FLUSH 100 UNIT/ML IV SOLN
500.0000 [IU] | Freq: Once | INTRAVENOUS | Status: AC
  Administered 2021-08-02: 500 [IU]

## 2021-08-08 ENCOUNTER — Telehealth: Payer: Self-pay

## 2021-08-08 NOTE — Telephone Encounter (Signed)
Notified patient of need for signed release of information form to send information to [employer]. Patient stated that he will e-mail the signed form today.

## 2021-08-08 NOTE — Telephone Encounter (Signed)
Notified Patient of completion of Short Term Disability paperwork. Request for medical records forwarded to [employer]

## 2021-08-09 ENCOUNTER — Inpatient Hospital Stay: Payer: Managed Care, Other (non HMO) | Attending: Physician Assistant

## 2021-08-09 ENCOUNTER — Other Ambulatory Visit: Payer: Self-pay

## 2021-08-09 DIAGNOSIS — Z5112 Encounter for antineoplastic immunotherapy: Secondary | ICD-10-CM | POA: Insufficient documentation

## 2021-08-09 DIAGNOSIS — Z87891 Personal history of nicotine dependence: Secondary | ICD-10-CM | POA: Insufficient documentation

## 2021-08-09 DIAGNOSIS — C3432 Malignant neoplasm of lower lobe, left bronchus or lung: Secondary | ICD-10-CM | POA: Diagnosis present

## 2021-08-09 DIAGNOSIS — Z95828 Presence of other vascular implants and grafts: Secondary | ICD-10-CM

## 2021-08-09 DIAGNOSIS — Z5111 Encounter for antineoplastic chemotherapy: Secondary | ICD-10-CM | POA: Diagnosis present

## 2021-08-09 DIAGNOSIS — Z79899 Other long term (current) drug therapy: Secondary | ICD-10-CM | POA: Diagnosis not present

## 2021-08-09 LAB — CMP (CANCER CENTER ONLY)
ALT: 21 U/L (ref 0–44)
AST: 19 U/L (ref 15–41)
Albumin: 3.5 g/dL (ref 3.5–5.0)
Alkaline Phosphatase: 83 U/L (ref 38–126)
Anion gap: 11 (ref 5–15)
BUN: 10 mg/dL (ref 6–20)
CO2: 24 mmol/L (ref 22–32)
Calcium: 9.5 mg/dL (ref 8.9–10.3)
Chloride: 107 mmol/L (ref 98–111)
Creatinine: 0.98 mg/dL (ref 0.61–1.24)
GFR, Estimated: >60 mL/min (ref >60)
Glucose, Bld: 110 mg/dL — ABNORMAL HIGH (ref 70–99)
Potassium: 3.9 mmol/L (ref 3.5–5.1)
Sodium: 142 mmol/L (ref 135–145)
Total Bilirubin: <0.2 mg/dL — ABNORMAL LOW (ref 0.3–1.2)
Total Protein: 6.7 g/dL (ref 6.5–8.1)

## 2021-08-09 LAB — CBC WITH DIFFERENTIAL (CANCER CENTER ONLY)
Abs Immature Granulocytes: 0.01 10*3/uL (ref 0.00–0.07)
Basophils Absolute: 0 10*3/uL (ref 0.0–0.1)
Basophils Relative: 0 %
Eosinophils Absolute: 0.1 10*3/uL (ref 0.0–0.5)
Eosinophils Relative: 2 %
HCT: 26.4 % — ABNORMAL LOW (ref 39.0–52.0)
Hemoglobin: 9 g/dL — ABNORMAL LOW (ref 13.0–17.0)
Immature Granulocytes: 0 %
Lymphocytes Relative: 26 %
Lymphs Abs: 0.7 10*3/uL (ref 0.7–4.0)
MCH: 31.3 pg (ref 26.0–34.0)
MCHC: 34.1 g/dL (ref 30.0–36.0)
MCV: 91.7 fL (ref 80.0–100.0)
Monocytes Absolute: 0.6 10*3/uL (ref 0.1–1.0)
Monocytes Relative: 22 %
Neutro Abs: 1.4 10*3/uL — ABNORMAL LOW (ref 1.7–7.7)
Neutrophils Relative %: 50 %
Platelet Count: 126 10*3/uL — ABNORMAL LOW (ref 150–400)
RBC: 2.88 MIL/uL — ABNORMAL LOW (ref 4.22–5.81)
RDW: 15.2 % (ref 11.5–15.5)
WBC Count: 2.7 10*3/uL — ABNORMAL LOW (ref 4.0–10.5)
nRBC: 0 % (ref 0.0–0.2)

## 2021-08-09 MED ORDER — SODIUM CHLORIDE 0.9% FLUSH
10.0000 mL | Freq: Once | INTRAVENOUS | Status: AC
  Administered 2021-08-09: 10 mL

## 2021-08-09 MED ORDER — HEPARIN SOD (PORK) LOCK FLUSH 100 UNIT/ML IV SOLN
500.0000 [IU] | Freq: Once | INTRAVENOUS | Status: AC
  Administered 2021-08-09: 500 [IU]

## 2021-08-11 ENCOUNTER — Telehealth: Payer: Self-pay

## 2021-08-11 NOTE — Progress Notes (Signed)
[facility] OFFICE PROGRESS NOTE  [name], MD [place] [address]  DIAGNOSIS:  Stage IV (T2b, N2, M1 B) non-small cell lung cancer, adenocarcinoma presented with large central left lower lobe lung mass with left hilar and mediastinal lymphadenopathy as well as abdominal retroperitoneal lymphadenopathy diagnosed in May 2022.  The patient also has bilateral parotid gland nodules that need close monitoring.   [facility] MOLECULAR STUDY: Results with Therapy Associations BIOMARKER METHOD ANALYTE RESULT THERAPY ASSOCIATION BIOMARKER LEVEL* .PD-L1 (22c3) IHC Protein Positive, TPS: 50% BENEFIT cemiplimab, pembrolizumab Level 1 .PD-L1 (28-8) IHC Protein Positive  1+, 50% BENEFIT nivolumab/ipilimumab combination Level 1 .TMB Seq DNA-Tumor High, 18 mut/Mb BENEFIT pembrolizumab Level 2 . alectinib, ceritinib, crizotinib, lorlatinib Level 1 . IHC Protein Negative  0 brigatinib Level 2 . ALK Seq RNA-Tumor Fusion Not Detected LACK OF BENEFIT alectinib, brigatinib, ceritinib, crizotinib, lorlatinib Level 2 .BRAF Seq DNA-Tumor Mutation Not Detected LACK OF BENEFIT dabrafenib and trametinib combination therapy, vemurafenib Level 2 .EGFR Seq DNA-Tumor Mutation Not Detected LACK OF BENEFIT erlotinib, gefitinib Level 2 .KRAS Seq DNA-Tumor Mutation Not Detected LACK OF BENEFIT sotorasib Level 2 .RET Seq RNA-Tumor Fusion Not Detected LACK OF BENEFIT pralsetinib, selpercatinib Level 2 .ROS1 Seq RNA-Tumor Fusion Not Detected LACK OF BENEFIT ceritinib, crizotinib, entrectinib, lorlatinib Level 2 . CNA-Seq DNA-Tumor Amplification Not Detected . MET Seq RNA-Tumor Variant Transcript Not Detected LACK OF BENEFIT crizotinib Level 3  PRIOR THERAPY: Palliative radiotherapy to the large central left lower lobe lung mass under the care of Dr. Lisbeth Renshaw. Last treatment on 06/12/21.  CURRENT THERAPY: Systemic chemotherapy with carboplatin for AUC of 5, Alimta 500 Mg/M2  and Keytruda 200 Mg IV every 3 weeks.  First dose expected June 14, 2021.  Status post 3 cycles of treatment.  INTERVAL HISTORY: Cameron Harper 45 y.o. male returns to the clinic today for a follow-up visit.  The patient is feeling fairly well today without any concerning complaints except for he had some fatigue 1 week following treatment and gum bleeding following treatment. Reviewing his weekly labs, he did not have significant thrombocytopenia. He does have poor dentition and has not seen a dentist in some time. He started using some mouthwash which seemed to help. Denies swelling in the gums. He also has 2 herniated disks in his back and sometimes had pain shooting down his right leg into his calf. Sometimes he only has calf pain. The discomfort comes and goes and occurs more at rest. There is no calf swelling, erythema, or tenderness to palpation. The patient denies any recent fever, chills, night sweats, or unexplained weight loss.  He denies any chest pain, shortness of breath, or hemoptysis. He states he coughs infrequently and when he does, denies sputum production. He mostly coughs when he feels a "tickle" in his throat.  He denies any nausea, vomiting, diarrhea, or constipation.  He denies any rashes or skin changes.  He denies any headache or visual changes.  The patient recently had a restaging CT scan performed.  He is here today for evaluation to review his scan results before starting cycle #4.    MEDICAL HISTORY: Past Medical History:  Diagnosis Date   Chronic back pain    Eczema    GERD (gastroesophageal reflux disease)    Lung cancer (HCC)     ALLERGIES:  is allergic to penicillins.  MEDICATIONS:  Current Outpatient Medications  Medication Sig Dispense Refill   acetaminophen (TYLENOL) 500 MG tablet Take 500-1,000 mg by mouth every  6 (six) hours as needed (for back pain.).     folic acid (FOLVITE) 1 MG tablet Take 1 tablet (1 mg total) by mouth daily. 30 tablet 4    lidocaine-prilocaine (EMLA) cream Apply 1 application topically as needed. 30 g 2   methylPREDNISolone (MEDROL DOSEPAK) 4 MG TBPK tablet Take per package instructions. 21 tablet 0   prochlorperazine (COMPAZINE) 10 MG tablet Take 1 tablet (10 mg total) by mouth every 6 (six) hours as needed for nausea or vomiting. 30 tablet 0   traMADol (ULTRAM) 50 MG tablet Take 50 mg by mouth every 6 (six) hours as needed for severe pain.     zolpidem (AMBIEN) 10 MG tablet Take 5-10 mg by mouth at bedtime as needed for sleep.     No current facility-administered medications for this visit.    SURGICAL HISTORY:  Past Surgical History:  Procedure Laterality Date   BRONCHIAL BRUSHINGS  05/12/2021   Procedure: BRONCHIAL BRUSHINGS;  Surgeon: Garner Nash, DO;  Location: [facility];  Service: Pulmonary;;   BRONCHIAL DILITATION  05/12/2021   Procedure: BRONCHIAL DILITATION;  Surgeon: Garner Nash, DO;  Location: [facility];  Service: Pulmonary;;   BRONCHIAL NEEDLE ASPIRATION BIOPSY  05/12/2021   Procedure: BRONCHIAL NEEDLE ASPIRATION BIOPSIES;  Surgeon: Garner Nash, DO;  Location: [facility];  Service: Pulmonary;;   BRONCHIAL WASHINGS  05/12/2021   Procedure: BRONCHIAL WASHINGS;  Surgeon: Garner Nash, DO;  Location: [facility];  Service: Pulmonary;;   CRYOTHERAPY  05/12/2021   Procedure: CRYOTHERAPY;  Surgeon: Garner Nash, DO;  Location: [facility];  Service: Pulmonary;;   HEMOSTASIS CONTROL  05/12/2021   Procedure: HEMOSTASIS CONTROL;  Surgeon: Garner Nash, DO;  Location: [facility];  Service: Pulmonary;;  epi injection   IR IMAGING GUIDED PORT INSERTION  06/29/2021   Spine injection     Pain control   VIDEO BRONCHOSCOPY WITH ENDOBRONCHIAL ULTRASOUND N/A 05/12/2021   Procedure: VIDEO BRONCHOSCOPY WITH ENDOBRONCHIAL ULTRASOUND;  Surgeon: Garner Nash, DO;  Location: [facility];  Service: Pulmonary;  Laterality: N/A;   WISDOM TOOTH EXTRACTION     no anesthesia involed     REVIEW OF SYSTEMS:   Review of Systems  Constitutional: Positive for fatigue 1 week following treatment. Negative for appetite change, chills, fever and unexpected weight change.  HENT: Negative for mouth sores, nosebleeds, sore throat and trouble swallowing.   Eyes: Negative for eye problems and icterus.  Respiratory: Negative for cough, hemoptysis, shortness of breath and wheezing.   Cardiovascular: Negative for chest pain and leg swelling.  Gastrointestinal: Negative for abdominal pain, constipation, diarrhea, nausea and vomiting.  Genitourinary: Negative for bladder incontinence, difficulty urinating, dysuria, frequency and hematuria.   Musculoskeletal: Positive for intermittent calf and back pain. Negative for gait problem, neck pain and neck stiffness.  Skin: Negative for itching and rash.  Neurological: Negative for dizziness, extremity weakness, gait problem, headaches, light-headedness and seizures.  Hematological: Negative for adenopathy. Does not bruise/bleed easily.  Psychiatric/Behavioral: Negative for confusion, depression and sleep disturbance. The patient is not nervous/anxious.     PHYSICAL EXAMINATION:  Blood pressure 140/75, pulse 86, temperature (!) 97.5 F (36.4 C), temperature source Tympanic, resp. rate 18, weight 168 lb 7 oz (76.4 kg), SpO2 100 %.  ECOG PERFORMANCE STATUS: 1  Physical Exam  Constitutional: Oriented to person, place, and time and well-developed, well-nourished, and in no distress.  HENT:  Head: Normocephalic and atraumatic.  Mouth/Throat: Oropharynx is clear and moist. Poor dentition and gingival  disease. No oropharyngeal exudate.  Eyes: Conjunctivae are normal. Right eye exhibits no discharge. Left eye exhibits no discharge. No scleral icterus.  Neck: Normal range of motion. Neck supple.  Cardiovascular: Normal rate, regular rhythm, normal heart sounds and intact distal pulses.   Pulmonary/Chest: Effort normal and breath sounds normal. No  respiratory distress. No wheezes. No rales.  Abdominal: Soft. Bowel sounds are normal. Exhibits no distension and no mass. There is no tenderness.  Musculoskeletal: Normal range of motion. Exhibits no edema. No calf pain to palpation, erythema, or swelling. Lymphadenopathy:    No cervical adenopathy.  Neurological: Alert and oriented to person, place, and time. Exhibits normal muscle tone. Gait normal. Coordination normal.  Skin: Skin is warm and dry. No rash noted. Not diaphoretic. No erythema. No pallor.  Psychiatric: Mood, memory and judgment normal.  Vitals reviewed.  LABORATORY DATA: Lab Results  Component Value Date   WBC 3.6 (L) 08/17/2021   HGB 9.3 (L) 08/17/2021   HCT 27.3 (L) 08/17/2021   MCV 93.8 08/17/2021   PLT 207 08/17/2021      Chemistry      Component Value Date/Time   NA 143 08/17/2021 0948   K 4.0 08/17/2021 0948   CL 108 08/17/2021 0948   CO2 26 08/17/2021 0948   BUN 8 08/17/2021 0948   CREATININE 0.98 08/17/2021 0948      Component Value Date/Time   CALCIUM 9.5 08/17/2021 0948   ALKPHOS 84 08/17/2021 0948   AST 18 08/17/2021 0948   ALT 23 08/17/2021 0948   BILITOT 0.2 (L) 08/17/2021 0948       RADIOGRAPHIC STUDIES:  CT Chest W Contrast  Result Date: 08/15/2021 CLINICAL DATA:  Primary Cancer Type: Lung Imaging Indication: Assess response to therapy Interval therapy since last imaging? Yes Initial Cancer Diagnosis Date: 05/12/2021; Established by: Biopsy-proven Detailed Pathology: Stage IV non-small cell lung cancer, adenocarcinoma. Primary Tumor location:  Left hilum and central left lower lobe. Surgeries: No. Chemotherapy: Yes; Ongoing? Yes; Most recent administration: 07/27/2021 Immunotherapy?  Yes; Type: Keytruda; Ongoing? Yes Radiation therapy? Yes; Date Range: 05/22/2021 - 06/12/2021; Target: Left lung EXAM: CT CHEST, ABDOMEN, AND PELVIS WITH CONTRAST TECHNIQUE: Multidetector CT imaging of the chest, abdomen and pelvis was performed following the  standard protocol during bolus administration of intravenous contrast. CONTRAST:  67mL OMNIPAQUE IOHEXOL 350 MG/ML SOLN COMPARISON:  05/02/2021 PET-CT FINDINGS: CT CHEST FINDINGS Cardiovascular: No acute findings. Aortic atherosclerotic calcification noted. Minimal pericardial fluid again seen in the superior pericardial recess and along the superior margin of the right atrium. Mediastinum/Lymph Nodes: There has been near complete resolution of mediastinal lymphadenopathy since prior study. Largest residual lymph node in the [facility] window measures 12 mm short axis compared to 21 mm previously. No new lymphadenopathy identified. Lungs/Pleura: Significant decrease in size of soft tissue mass involving the central left lower lobe and left hilum since prior study. This currently measures 2.4 x 2.1 cm compared to 4.9 by 3.7 cm previously. No other suspicious pulmonary nodules or masses identified. No evidence of pleural effusion. Musculoskeletal:  No suspicious bone lesions identified. CT ABDOMEN AND PELVIS FINDINGS Hepatobiliary: No masses identified. Gallbladder is unremarkable. No evidence of biliary ductal dilatation. Pancreas:  No mass or inflammatory changes. Spleen:  Within normal limits in size and appearance. Adrenals/Urinary tract: Normal appearance of both adrenal glands. Small cyst noted in the upper pole the left kidney. No renal masses or hydronephrosis. Stomach/Bowel: No evidence of obstruction, inflammatory process, or abnormal fluid collections. Normal appendix visualized. Diverticulosis  is seen mainly involving the sigmoid colon, however there is no evidence of diverticulitis. Vascular/Lymphatic: Decreased retroperitoneal lymphadenopathy is seen in the aortocaval space, with largest residual lymph node measuring 9 mm on image 64/2, compared to 21 mm previously. No other sites of lymphadenopathy identified. Aortic atherosclerotic calcification noted. No acute vascular findings. Reproductive:  No mass or  other significant abnormality identified. Other:  None. Musculoskeletal:  No suspicious bone lesions identified. IMPRESSION: Significant decrease in size of mass involving the central left lower lobe and left hilum. Near complete resolution of mediastinal and abdominal retroperitoneal lymphadenopathy. No new or progressive disease within the chest, abdomen, or pelvis. Colonic diverticulosis, without radiographic evidence of diverticulitis. Aortic Atherosclerosis (ICD10-I70.0). Electronically Signed   By: Marlaine Hind M.D.   On: 08/15/2021 11:45   CT Abdomen Pelvis W Contrast  Result Date: 08/15/2021 CLINICAL DATA:  Primary Cancer Type: Lung Imaging Indication: Assess response to therapy Interval therapy since last imaging? Yes Initial Cancer Diagnosis Date: 05/12/2021; Established by: Biopsy-proven Detailed Pathology: Stage IV non-small cell lung cancer, adenocarcinoma. Primary Tumor location:  Left hilum and central left lower lobe. Surgeries: No. Chemotherapy: Yes; Ongoing? Yes; Most recent administration: 07/27/2021 Immunotherapy?  Yes; Type: Keytruda; Ongoing? Yes Radiation therapy? Yes; Date Range: 05/22/2021 - 06/12/2021; Target: Left lung EXAM: CT CHEST, ABDOMEN, AND PELVIS WITH CONTRAST TECHNIQUE: Multidetector CT imaging of the chest, abdomen and pelvis was performed following the standard protocol during bolus administration of intravenous contrast. CONTRAST:  45mL OMNIPAQUE IOHEXOL 350 MG/ML SOLN COMPARISON:  05/02/2021 PET-CT FINDINGS: CT CHEST FINDINGS Cardiovascular: No acute findings. Aortic atherosclerotic calcification noted. Minimal pericardial fluid again seen in the superior pericardial recess and along the superior margin of the right atrium. Mediastinum/Lymph Nodes: There has been near complete resolution of mediastinal lymphadenopathy since prior study. Largest residual lymph node in the [facility] window measures 12 mm short axis compared to 21 mm previously. No new lymphadenopathy identified.  Lungs/Pleura: Significant decrease in size of soft tissue mass involving the central left lower lobe and left hilum since prior study. This currently measures 2.4 x 2.1 cm compared to 4.9 by 3.7 cm previously. No other suspicious pulmonary nodules or masses identified. No evidence of pleural effusion. Musculoskeletal:  No suspicious bone lesions identified. CT ABDOMEN AND PELVIS FINDINGS Hepatobiliary: No masses identified. Gallbladder is unremarkable. No evidence of biliary ductal dilatation. Pancreas:  No mass or inflammatory changes. Spleen:  Within normal limits in size and appearance. Adrenals/Urinary tract: Normal appearance of both adrenal glands. Small cyst noted in the upper pole the left kidney. No renal masses or hydronephrosis. Stomach/Bowel: No evidence of obstruction, inflammatory process, or abnormal fluid collections. Normal appendix visualized. Diverticulosis is seen mainly involving the sigmoid colon, however there is no evidence of diverticulitis. Vascular/Lymphatic: Decreased retroperitoneal lymphadenopathy is seen in the aortocaval space, with largest residual lymph node measuring 9 mm on image 64/2, compared to 21 mm previously. No other sites of lymphadenopathy identified. Aortic atherosclerotic calcification noted. No acute vascular findings. Reproductive:  No mass or other significant abnormality identified. Other:  None. Musculoskeletal:  No suspicious bone lesions identified. IMPRESSION: Significant decrease in size of mass involving the central left lower lobe and left hilum. Near complete resolution of mediastinal and abdominal retroperitoneal lymphadenopathy. No new or progressive disease within the chest, abdomen, or pelvis. Colonic diverticulosis, without radiographic evidence of diverticulitis. Aortic Atherosclerosis (ICD10-I70.0). Electronically Signed   By: Marlaine Hind M.D.   On: 08/15/2021 11:45     ASSESSMENT/PLAN:  This is a very  pleasant 45 year old Caucasian male  diagnosed with stage IV (T2b, N2, M1 B) non-small cell lung cancer, adenocarcinoma.  He presented with a large central left lower lobe lung mass in addition to left hilar mediastinal lymphadenopathy.  He also has retroperitoneal abdominal lymphadenopathy.  He was diagnosed in May 2022.  His PD-L1 expression is 50%.  His molecular studies by [facility] did not show any evidence of any actionable mutations.  He underwent palliative radiotherapy to the large left lower lobe lung mass under the care of Dr. Lisbeth Renshaw.  He completed this on 06/12/2021.  The patient is currently undergoing systemic chemotherapy with carboplatin for an AUC of 5, Alimta 500 mg per metered squared, and Keytruda 200 mg IV every 3 weeks.  He is status post 3 cycles.   The patient was seen with Dr. Julien Nordmann today.  Labs were reviewed.  The patient recently had a restaging CT scan performed.  Dr. Julien Nordmann personally and independently reviewed the scan and discussed the results with the patient today.  The scan showed improvement in his disease with improvement in the lung mass and adenopathy.  Dr. Julien Nordmann recommends that he continue on the same treatment at the same dose.  He will proceed with cycle #4 today scheduled.  We will see him back for follow-up visit in 3 weeks for evaluation before starting cycle #5.  No significant thrombocytopenia on weekly labs. We will continue to monitor his labs closely weekly. From exam, he has poor dentition and it is likely the gum bleeding is secondary to gingival disease. No signs of infection. Encouraged him to have good oral hygiene and to ensure he sees his dentist for routine cleanings/evaluations.   For his calf pain, no swelling, erythema, or tenderness on exam today. His pain comes and goes and he has radicular pain from his back down his legs as well. Advised to monitor for now and to call us if he develops new or worsening symptoms such as swelling, erythema, or tenderness and we may consider him  for ultrasound of the lower extremity.   The patient was advised to call immediately if he has any concerning symptoms in the interval. The patient voices understanding of current disease status and treatment options and is in agreement with the current care plan. All questions were answered. The patient knows to call the clinic with any problems, questions or concerns. We can certainly see the patient much sooner if necessary       Orders Placed This Encounter  Procedures   TSH    Standing Status:   Standing    Number of Occurrences:   12    Standing Expiration Date:   08/17/2022      Tobe Sos Delayla Hoffmaster, PA-C 08/17/21  ADDENDUM: Hematology/Oncology Attending: I had a face-to-face encounter with the patient today.  I reviewed his record, lab and scan and recommended his care plan.  This is a very pleasant 45 years old white male diagnosed with a stage IV non-small cell lung cancer, adenocarcinoma with no actionable mutations and PD-L1 expression of 50%.  The patient is currently undergoing systemic chemotherapy with carboplatin for AUC of 5, Alimta 500 Mg/M2 and Keytruda 200 Mg IV every 3 weeks.  He is status post 3 cycles.  He has been tolerating this treatment well with no concerning adverse effects except for mild fatigue. He had repeat CT scan of the chest, abdomen pelvis performed recently.  I personally and independently reviewed the scan images and discussed the result and  showed the images to the patient today. His scan showed improvement of his disease with decrease in the size of the primary tumor in the left lung as well as almost complete resolution of the lymphadenopathy. I recommended for the patient to continue his current treatment with the chemotherapy as well as immunotherapy. He will proceed with cycle #4 today. He will come back for follow-up visit in 3 weeks for evaluation before starting cycle #5 with maintenance Alimta and Keytruda every 3 weeks. The  patient was advised to call immediately if he has any other concerning symptoms in the interval. The total time spent in the appointment was 40 minutes.  Disclaimer: This note was dictated with voice recognition software. Similar sounding words can inadvertently be transcribed and may be missed upon review. Eilleen Kempf, MD 08/17/21

## 2021-08-11 NOTE — Telephone Encounter (Signed)
Pt called requesting to have his weekly labs at [facility] but still come to [facility] on his tx weeks.  I have called [facility] and scheduled the pts next to lab appts on 9/22 at 1:30p and 9/29 at 1pm. I have called the pt back to advise of this. He expressed understanding of this information.

## 2021-08-15 ENCOUNTER — Ambulatory Visit (HOSPITAL_COMMUNITY)
Admission: RE | Admit: 2021-08-15 | Discharge: 2021-08-15 | Disposition: A | Discharge status: Home / Self Care | Payer: Managed Care, Other (non HMO) | Source: Ambulatory Visit | Attending: Internal Medicine | Admitting: Internal Medicine

## 2021-08-15 ENCOUNTER — Other Ambulatory Visit: Payer: Self-pay

## 2021-08-15 DIAGNOSIS — C3490 Malignant neoplasm of unspecified part of unspecified bronchus or lung: Secondary | ICD-10-CM | POA: Insufficient documentation

## 2021-08-15 MED ORDER — HEPARIN SOD (PORK) LOCK FLUSH 100 UNIT/ML IV SOLN
INTRAVENOUS | Status: AC
  Administered 2021-08-15: 500 [IU] via INTRAVENOUS
  Filled 2021-08-15: qty 5

## 2021-08-15 MED ORDER — HEPARIN SOD (PORK) LOCK FLUSH 100 UNIT/ML IV SOLN: 500.0000 [IU] | Freq: Once | INTRAVENOUS | Status: AC

## 2021-08-15 MED ORDER — IOHEXOL 350 MG/ML SOLN
75.0000 mL | Freq: Once | INTRAVENOUS | Status: AC | PRN
  Administered 2021-08-15: 75 mL via INTRAVENOUS

## 2021-08-17 ENCOUNTER — Inpatient Hospital Stay: Payer: Managed Care, Other (non HMO)

## 2021-08-17 ENCOUNTER — Other Ambulatory Visit: Payer: Self-pay

## 2021-08-17 ENCOUNTER — Inpatient Hospital Stay (HOSPITAL_BASED_OUTPATIENT_CLINIC_OR_DEPARTMENT_OTHER): Payer: Managed Care, Other (non HMO) | Admitting: Physician Assistant

## 2021-08-17 VITALS — BP 140/75 | HR 86 | Temp 97.5°F | Resp 18 | Wt 168.4 lb

## 2021-08-17 DIAGNOSIS — Z5112 Encounter for antineoplastic immunotherapy: Secondary | ICD-10-CM | POA: Diagnosis not present

## 2021-08-17 DIAGNOSIS — C3432 Malignant neoplasm of lower lobe, left bronchus or lung: Secondary | ICD-10-CM

## 2021-08-17 DIAGNOSIS — Z5111 Encounter for antineoplastic chemotherapy: Secondary | ICD-10-CM

## 2021-08-17 DIAGNOSIS — Z95828 Presence of other vascular implants and grafts: Secondary | ICD-10-CM

## 2021-08-17 LAB — CMP (CANCER CENTER ONLY)
ALT: 23 U/L (ref 0–44)
AST: 18 U/L (ref 15–41)
Albumin: 3.5 g/dL (ref 3.5–5.0)
Alkaline Phosphatase: 84 U/L (ref 38–126)
Anion gap: 9 (ref 5–15)
BUN: 8 mg/dL (ref 6–20)
CO2: 26 mmol/L (ref 22–32)
Calcium: 9.5 mg/dL (ref 8.9–10.3)
Chloride: 108 mmol/L (ref 98–111)
Creatinine: 0.98 mg/dL (ref 0.61–1.24)
GFR, Estimated: >60 mL/min (ref >60)
Glucose, Bld: 90 mg/dL (ref 70–99)
Potassium: 4 mmol/L (ref 3.5–5.1)
Sodium: 143 mmol/L (ref 135–145)
Total Bilirubin: 0.2 mg/dL — ABNORMAL LOW (ref 0.3–1.2)
Total Protein: 6.8 g/dL (ref 6.5–8.1)

## 2021-08-17 LAB — CBC WITH DIFFERENTIAL (CANCER CENTER ONLY)
Abs Immature Granulocytes: 0.06 10*3/uL (ref 0.00–0.07)
Basophils Absolute: 0 10*3/uL (ref 0.0–0.1)
Basophils Relative: 1 %
Eosinophils Absolute: 0.1 10*3/uL (ref 0.0–0.5)
Eosinophils Relative: 2 %
HCT: 27.3 % — ABNORMAL LOW (ref 39.0–52.0)
Hemoglobin: 9.3 g/dL — ABNORMAL LOW (ref 13.0–17.0)
Immature Granulocytes: 2 %
Lymphocytes Relative: 19 %
Lymphs Abs: 0.7 10*3/uL (ref 0.7–4.0)
MCH: 32 pg (ref 26.0–34.0)
MCHC: 34.1 g/dL (ref 30.0–36.0)
MCV: 93.8 fL (ref 80.0–100.0)
Monocytes Absolute: 0.7 10*3/uL (ref 0.1–1.0)
Monocytes Relative: 18 %
Neutro Abs: 2.1 10*3/uL (ref 1.7–7.7)
Neutrophils Relative %: 58 %
Platelet Count: 207 10*3/uL (ref 150–400)
RBC: 2.91 MIL/uL — ABNORMAL LOW (ref 4.22–5.81)
RDW: 17.4 % — ABNORMAL HIGH (ref 11.5–15.5)
WBC Count: 3.6 10*3/uL — ABNORMAL LOW (ref 4.0–10.5)
nRBC: 0 % (ref 0.0–0.2)

## 2021-08-17 LAB — TSH: TSH: 1.863 u[IU]/mL (ref 0.320–4.118)

## 2021-08-17 MED ORDER — PALONOSETRON HCL INJECTION 0.25 MG/5ML
0.2500 mg | Freq: Once | INTRAVENOUS | Status: AC
Start: 2021-08-17 — End: 2021-08-17
  Administered 2021-08-17: 0.25 mg via INTRAVENOUS
  Filled 2021-08-17: qty 5

## 2021-08-17 MED ORDER — HEPARIN SOD (PORK) LOCK FLUSH 100 UNIT/ML IV SOLN
500.0000 [IU] | Freq: Once | INTRAVENOUS | Status: AC | PRN
  Administered 2021-08-17: 500 [IU]

## 2021-08-17 MED ORDER — SODIUM CHLORIDE 0.9% FLUSH
10.0000 mL | Freq: Once | INTRAVENOUS | Status: AC
  Administered 2021-08-17: 10 mL

## 2021-08-17 MED ORDER — SODIUM CHLORIDE 0.9 % IV SOLN
Freq: Once | INTRAVENOUS | Status: AC
Start: 2021-08-17 — End: 2021-08-17

## 2021-08-17 MED ORDER — SODIUM CHLORIDE 0.9 % IV SOLN
10.0000 mg | Freq: Once | INTRAVENOUS | Status: AC
  Administered 2021-08-17: 10 mg via INTRAVENOUS
  Filled 2021-08-17: qty 10

## 2021-08-17 MED ORDER — SODIUM CHLORIDE 0.9 % IV SOLN
200.0000 mg | Freq: Once | INTRAVENOUS | Status: AC
  Administered 2021-08-17: 200 mg via INTRAVENOUS
  Filled 2021-08-17 (×2): qty 8

## 2021-08-17 MED ORDER — SODIUM CHLORIDE 0.9 % IV SOLN
610.0000 mg | Freq: Once | INTRAVENOUS | Status: AC
  Administered 2021-08-17: 610 mg via INTRAVENOUS
  Filled 2021-08-17: qty 61

## 2021-08-17 MED ORDER — SODIUM CHLORIDE 0.9% FLUSH
10.0000 mL | INTRAVENOUS | Status: DC | PRN
  Administered 2021-08-17: 10 mL

## 2021-08-17 MED ORDER — SODIUM CHLORIDE 0.9 % IV SOLN
500.0000 mg/m2 | Freq: Once | INTRAVENOUS | Status: AC
  Administered 2021-08-17: 1000 mg via INTRAVENOUS
  Filled 2021-08-17: qty 40

## 2021-08-17 MED ORDER — FOSAPREPITANT DIMEGLUMINE INJECTION 150 MG
150.0000 mg | Freq: Once | INTRAVENOUS | Status: AC
  Administered 2021-08-17: 150 mg via INTRAVENOUS
  Filled 2021-08-17: qty 150

## 2021-08-17 NOTE — Patient Instructions (Signed)
[facility]  Discharge Instructions: Thank you for choosing [facility] to provide your oncology and hematology care.   If you have a lab appointment with the [facility], please go directly to the [facility] and check in at the registration area.   Wear comfortable clothing and clothing appropriate for easy access to any Portacath or PICC line.   We strive to give you quality time with your provider. You may need to reschedule your appointment if you arrive late (15 or more minutes).  Arriving late affects you and other patients whose appointments are after yours.  Also, if you miss three or more appointments without notifying the office, you may be dismissed from the clinic at the provider's discretion.      For prescription refill requests, have your pharmacy contact our office and allow 72 hours for refills to be completed.    Today you received the following chemotherapy and/or immunotherapy agents Keytruda, Alimta and Carboplatin      To help prevent nausea and vomiting after your treatment, we encourage you to take your nausea medication as directed.  BELOW ARE SYMPTOMS THAT SHOULD BE REPORTED IMMEDIATELY: *FEVER GREATER THAN 100.4 F (38 C) OR HIGHER *CHILLS OR SWEATING *NAUSEA AND VOMITING THAT IS NOT CONTROLLED WITH YOUR NAUSEA MEDICATION *UNUSUAL SHORTNESS OF BREATH *UNUSUAL BRUISING OR BLEEDING *URINARY PROBLEMS (pain or burning when urinating, or frequent urination) *BOWEL PROBLEMS (unusual diarrhea, constipation, pain near the anus) TENDERNESS IN MOUTH AND THROAT WITH OR WITHOUT PRESENCE OF ULCERS (sore throat, sores in mouth, or a toothache) UNUSUAL RASH, SWELLING OR PAIN  UNUSUAL VAGINAL DISCHARGE OR ITCHING   Items with * indicate a potential emergency and should be followed up as soon as possible or go to the Emergency Department if any problems should occur.  Please show the CHEMOTHERAPY ALERT CARD or IMMUNOTHERAPY  ALERT CARD at check-in to the Emergency Department and triage nurse.  Should you have questions after your visit or need to cancel or reschedule your appointment, please contact [facility]  Dept: [phone]  and follow the prompts.  Office hours are 8:00 a.m. to 4:30 p.m. Monday - Friday. Please note that voicemails left after 4:00 p.m. may not be returned until the following business day.  We are closed weekends and major holidays. You have access to a nurse at all times for urgent questions. Please call the main number to the clinic Dept: [phone] and follow the prompts.   For any non-urgent questions, you may also contact your provider using MyChart. We now offer e-Visits for anyone 31 and older to request care online for non-urgent symptoms. For details visit [email or web address].   Also download the MyChart app! Go to the app store, search "MyChart", open the app, select [facility], and log in with your MyChart username and password.  Due to Covid, a mask is required upon entering the hospital/clinic. If you do not have a mask, one will be given to you upon arrival. For doctor visits, patients may have 1 support person aged 84 or older with them. For treatment visits, patients cannot have anyone with them due to current Covid guidelines and our immunocompromised population.

## 2021-08-21 ENCOUNTER — Telehealth: Payer: Self-pay | Admitting: Medical Oncology

## 2021-08-21 NOTE — Telephone Encounter (Signed)
I told him to keep weekly labs as scheduled.

## 2021-08-22 ENCOUNTER — Other Ambulatory Visit: Payer: Self-pay | Admitting: Physician Assistant

## 2021-08-22 DIAGNOSIS — K1379 Other lesions of oral mucosa: Secondary | ICD-10-CM

## 2021-08-22 MED ORDER — MAGIC MOUTHWASH: 5.0000 mL | Freq: Three times a day (TID) | ORAL | 0 refills | Status: DC | PRN

## 2021-08-24 ENCOUNTER — Encounter (HOSPITAL_COMMUNITY): Payer: Self-pay

## 2021-08-24 ENCOUNTER — Inpatient Hospital Stay (HOSPITAL_COMMUNITY): Payer: Managed Care, Other (non HMO) | Attending: Hematology

## 2021-08-24 VITALS — BP 111/62 | HR 87 | Temp 97.3°F | Resp 18

## 2021-08-24 DIAGNOSIS — C3490 Malignant neoplasm of unspecified part of unspecified bronchus or lung: Secondary | ICD-10-CM

## 2021-08-24 DIAGNOSIS — C3432 Malignant neoplasm of lower lobe, left bronchus or lung: Secondary | ICD-10-CM | POA: Insufficient documentation

## 2021-08-24 DIAGNOSIS — Z95828 Presence of other vascular implants and grafts: Secondary | ICD-10-CM

## 2021-08-24 LAB — CBC WITH DIFFERENTIAL/PLATELET
Abs Immature Granulocytes: 0.01 10*3/uL (ref 0.00–0.07)
Basophils Absolute: 0 10*3/uL (ref 0.0–0.1)
Basophils Relative: 1 %
Eosinophils Absolute: 0.1 10*3/uL (ref 0.0–0.5)
Eosinophils Relative: 4 %
HCT: 25.5 % — ABNORMAL LOW (ref 39.0–52.0)
Hemoglobin: 8.6 g/dL — ABNORMAL LOW (ref 13.0–17.0)
Immature Granulocytes: 1 %
Lymphocytes Relative: 23 %
Lymphs Abs: 0.5 10*3/uL — ABNORMAL LOW (ref 0.7–4.0)
MCH: 32.3 pg (ref 26.0–34.0)
MCHC: 33.7 g/dL (ref 30.0–36.0)
MCV: 95.9 fL (ref 80.0–100.0)
Monocytes Absolute: 0.1 10*3/uL (ref 0.1–1.0)
Monocytes Relative: 6 %
Neutro Abs: 1.5 10*3/uL — ABNORMAL LOW (ref 1.7–7.7)
Neutrophils Relative %: 65 %
Platelets: 245 10*3/uL (ref 150–400)
RBC: 2.66 MIL/uL — ABNORMAL LOW (ref 4.22–5.81)
RDW: 16.6 % — ABNORMAL HIGH (ref 11.5–15.5)
WBC: 2.2 10*3/uL — ABNORMAL LOW (ref 4.0–10.5)
nRBC: 0 % (ref 0.0–0.2)

## 2021-08-24 LAB — COMPREHENSIVE METABOLIC PANEL
ALT: 22 U/L (ref 0–44)
AST: 19 U/L (ref 15–41)
Albumin: 3.6 g/dL (ref 3.5–5.0)
Alkaline Phosphatase: 73 U/L (ref 38–126)
Anion gap: 7 (ref 5–15)
BUN: 16 mg/dL (ref 6–20)
CO2: 26 mmol/L (ref 22–32)
Calcium: 8.8 mg/dL — ABNORMAL LOW (ref 8.9–10.3)
Chloride: 104 mmol/L (ref 98–111)
Creatinine, Ser: 0.87 mg/dL (ref 0.61–1.24)
GFR, Estimated: >60 mL/min (ref >60)
Glucose, Bld: 121 mg/dL — ABNORMAL HIGH (ref 70–99)
Potassium: 3.5 mmol/L (ref 3.5–5.1)
Sodium: 137 mmol/L (ref 135–145)
Total Bilirubin: 0.2 mg/dL — ABNORMAL LOW (ref 0.3–1.2)
Total Protein: 6.9 g/dL (ref 6.5–8.1)

## 2021-08-24 LAB — TSH: TSH: 1.747 u[IU]/mL (ref 0.350–4.500)

## 2021-08-24 MED ORDER — HEPARIN SOD (PORK) LOCK FLUSH 100 UNIT/ML IV SOLN
500.0000 [IU] | Freq: Once | INTRAVENOUS | Status: AC
  Administered 2021-08-24: 500 [IU] via INTRAVENOUS

## 2021-08-24 MED ORDER — SODIUM CHLORIDE 0.9% FLUSH
10.0000 mL | Freq: Once | INTRAVENOUS | Status: AC
  Administered 2021-08-24: 10 mL via INTRAVENOUS

## 2021-08-24 NOTE — Patient Instructions (Signed)
[facility]  Discharge Instructions: °Thank you for choosing [facility] to provide your oncology and hematology care.  °If you have a lab appointment with the Cancer Center, please come in thru the Main Entrance and check in at the main information desk. ° °Wear comfortable clothing and clothing appropriate for easy access to any Portacath or PICC line.  ° °We strive to give you quality time with your provider. You may need to reschedule your appointment if you arrive late (15 or more minutes).  Arriving late affects you and other patients whose appointments are after yours.  Also, if you miss three or more appointments without notifying the office, you may be dismissed from the clinic at the provider’s discretion.    °  °For prescription refill requests, have your pharmacy contact our office and allow 72 hours for refills to be completed.   ° °Today your port was flushed and labs were drawn. Return as scheduled. °  °To help prevent nausea and vomiting after your treatment, we encourage you to take your nausea medication as directed. ° °BELOW ARE SYMPTOMS THAT SHOULD BE REPORTED IMMEDIATELY: °*FEVER GREATER THAN 100.4 F (38 °C) OR HIGHER °*CHILLS OR SWEATING °*NAUSEA AND VOMITING THAT IS NOT CONTROLLED WITH YOUR NAUSEA MEDICATION °*UNUSUAL SHORTNESS OF BREATH °*UNUSUAL BRUISING OR BLEEDING °*URINARY PROBLEMS (pain or burning when urinating, or frequent urination) °*BOWEL PROBLEMS (unusual diarrhea, constipation, pain near the anus) °TENDERNESS IN MOUTH AND THROAT WITH OR WITHOUT PRESENCE OF ULCERS (sore throat, sores in mouth, or a toothache) °UNUSUAL RASH, SWELLING OR PAIN  °UNUSUAL VAGINAL DISCHARGE OR ITCHING  ° °Items with * indicate a potential emergency and should be followed up as soon as possible or go to the Emergency Department if any problems should occur. ° °Please show the CHEMOTHERAPY ALERT CARD or IMMUNOTHERAPY ALERT CARD at check-in to the Emergency Department and triage  nurse. ° °Should you have questions after your visit or need to cancel or reschedule your appointment, please contact [facility] [phone]  and follow the prompts.  Office hours are 8:00 a.m. to 4:30 p.m. Monday - Friday. Please note that voicemails left after 4:00 p.m. may not be returned until the following business day.  We are closed weekends and major holidays. You have access to a nurse at all times for urgent questions. Please call the main number to the clinic [phone] and follow the prompts. ° °For any non-urgent questions, you may also contact your provider using MyChart. We now offer e-Visits for anyone 18 and older to request care online for non-urgent symptoms. For details visit [email or web address]. °  °Also download the MyChart app! Go to the app store, search "MyChart", open the app, select [facility], and log in with your MyChart username and password. ° °Due to Covid, a mask is required upon entering the hospital/clinic. If you do not have a mask, one will be given to you upon arrival. For doctor visits, patients may have 1 support person aged 18 or older with them. For treatment visits, patients cannot have anyone with them due to current Covid guidelines and our immunocompromised population.  °

## 2021-08-24 NOTE — Progress Notes (Signed)
Port flushed with good blood return noted. No bruising or swelling at site. Bandaid applied and patient discharged in satisfactory condition. VVS stable with no signs or symptoms of distressed noted. 

## 2021-08-25 ENCOUNTER — Other Ambulatory Visit: Payer: Self-pay | Admitting: Physician Assistant

## 2021-08-25 DIAGNOSIS — D649 Anemia, unspecified: Secondary | ICD-10-CM

## 2021-08-28 ENCOUNTER — Telehealth: Payer: Self-pay

## 2021-08-28 NOTE — Telephone Encounter (Signed)
Pts wife called to advise pt tested positive for COVID 08/26/21. Pt has runny nose, cough and intermittent fever, which they are managing with Tylenol. He is also making sure he stays hydrated. He was advised if he has CP or SOB, please go to the ER.  I have advised her to contact his PCP and or/Urgent Care to follow-up for additional recommendations. She expressed understanding of this information.  Schedule message sent to adjust pts schedule going forward.

## 2021-08-30 NOTE — Progress Notes (Signed)
[facility] OFFICE PROGRESS NOTE  Cameron Sites, MD [place] [address]  DIAGNOSIS: Stage IV (T2b, N2, M1 B) non-small cell lung cancer, adenocarcinoma presented with large central left lower lobe lung mass with left hilar and mediastinal lymphadenopathy as well as abdominal retroperitoneal lymphadenopathy diagnosed in May 2022.  The patient also has bilateral parotid gland nodules that need close monitoring.   [facility] MOLECULAR STUDY: Results with Therapy Associations BIOMARKER METHOD ANALYTE RESULT THERAPY ASSOCIATION BIOMARKER LEVEL* .PD-L1 (22c3) IHC Protein Positive, TPS: 50% BENEFIT cemiplimab, pembrolizumab Level 1 .PD-L1 (28-8) IHC Protein Positive  1+, 50% BENEFIT nivolumab/ipilimumab combination Level 1 .TMB Seq DNA-Tumor High, 18 mut/Mb BENEFIT pembrolizumab Level 2 . alectinib, ceritinib, crizotinib, lorlatinib Level 1 . IHC Protein Negative  0 brigatinib Level 2 . ALK Seq RNA-Tumor Fusion Not Detected LACK OF BENEFIT alectinib, brigatinib, ceritinib, crizotinib, lorlatinib Level 2 .BRAF Seq DNA-Tumor Mutation Not Detected LACK OF BENEFIT dabrafenib and trametinib combination therapy, vemurafenib Level 2 .EGFR Seq DNA-Tumor Mutation Not Detected LACK OF BENEFIT erlotinib, gefitinib Level 2 .KRAS Seq DNA-Tumor Mutation Not Detected LACK OF BENEFIT sotorasib Level 2 .RET Seq RNA-Tumor Fusion Not Detected LACK OF BENEFIT pralsetinib, selpercatinib Level 2 .ROS1 Seq RNA-Tumor Fusion Not Detected LACK OF BENEFIT ceritinib, crizotinib, entrectinib, lorlatinib Level 2 . CNA-Seq DNA-Tumor Amplification Not Detected . MET Seq RNA-Tumor Variant Transcript Not Detected LACK OF BENEFIT crizotinib Level 3  PRIOR THERAPY: Palliative radiotherapy to the large central left lower lobe lung mass under the care of Dr. Lisbeth Harper. Last treatment on 06/12/21.  CURRENT THERAPY: Systemic chemotherapy with carboplatin for AUC of 5, Alimta 500 Mg/M2  and Keytruda 200 Mg IV every 3 weeks.  First dose expected June 14, 2021.  Status post 4 cycles of treatment.  Starting from cycle #5, the patient will begin maintenance Keytruda and Alimta.  INTERVAL HISTORY: Cameron Harper 45 y.o. male returns to the clinic today for a follow-up visit.  In the interval since the patient's last appointment, the patient was diagnosed with COVID-19 on 08/26/2021. He had fevers for about 6 hours and a cough. He took tylenol. He had some congestion as well for which he took benadryl. His cough is mostly resolved and he states it is "not bad at all". He states he "pretty much feels back to normal".   He denies any recent fever, chills, or night sweats since being diagnosed with COVID-19.  He is weight is stable. He denies any significant chest pain, shortness of breath, or hemoptysis.  He denies any nausea, vomiting, diarrhea, or constipation.  He denies any rashes or skin changes.  Denies any headache or visual changes except he had a headache for 1 day while adjusting to his new bifocal glasses. He has a lot of dental disease but had to cancel his appointment with his dentist due to COVID-19. He will reschedule. He has magic mouth wash if needed for mucositis and he uses salt water rinses which is effective for him. He is here today for evaluation and repeat blood work before starting cycle #5.     MEDICAL HISTORY: Past Medical History:  Diagnosis Date   Chronic back pain    Eczema    GERD (gastroesophageal reflux disease)    Lung cancer (HCC)     ALLERGIES:  is allergic to penicillins.  MEDICATIONS:  Current Outpatient Medications  Medication Sig Dispense Refill   acetaminophen (TYLENOL) 500 MG tablet Take 500-1,000 mg by mouth every 6 (six) hours as needed (  for back pain.).     folic acid (FOLVITE) 1 MG tablet Take 1 tablet (1 mg total) by mouth daily. 30 tablet 4   lidocaine-prilocaine (EMLA) cream Apply 1 application topically as needed. 30 g 2    magic mouthwash SOLN Take 5 mLs by mouth 3 (three) times daily as needed for mouth pain. 240 mL 0   methylPREDNISolone (MEDROL DOSEPAK) 4 MG TBPK tablet Take per package instructions. (Patient not taking: Reported on 08/24/2021) 21 tablet 0   prochlorperazine (COMPAZINE) 10 MG tablet Take 1 tablet (10 mg total) by mouth every 6 (six) hours as needed for nausea or vomiting. 30 tablet 0   traMADol (ULTRAM) 50 MG tablet Take 50 mg by mouth every 6 (six) hours as needed for severe pain.     zolpidem (AMBIEN) 10 MG tablet Take 5-10 mg by mouth at bedtime as needed for sleep.     No current facility-administered medications for this visit.   Facility-Administered Medications Ordered in Other Visits  Medication Dose Route Frequency Provider Last Rate Last Admin   0.9 %  sodium chloride infusion   Intravenous Once Cameron Bears, MD       pembrolizumab [facility]) 200 mg in sodium chloride 0.9 % 50 mL chemo infusion  200 mg Intravenous Once Cameron Bears, MD       PEMEtrexed (ALIMTA) 1,000 mg in sodium chloride 0.9 % 100 mL chemo infusion  500 mg/m2 (Treatment Plan Recorded) Intravenous Once Cameron Bears, MD       prochlorperazine (COMPAZINE) tablet 10 mg  10 mg Oral Once Cameron Bears, MD        SURGICAL HISTORY:  Past Surgical History:  Procedure Laterality Date   BRONCHIAL BRUSHINGS  05/12/2021   Procedure: BRONCHIAL BRUSHINGS;  Surgeon: Garner Nash, DO;  Location: [facility];  Service: Pulmonary;;   BRONCHIAL DILITATION  05/12/2021   Procedure: BRONCHIAL DILITATION;  Surgeon: Garner Nash, DO;  Location: [facility];  Service: Pulmonary;;   BRONCHIAL NEEDLE ASPIRATION BIOPSY  05/12/2021   Procedure: BRONCHIAL NEEDLE ASPIRATION BIOPSIES;  Surgeon: Garner Nash, DO;  Location: [facility];  Service: Pulmonary;;   BRONCHIAL WASHINGS  05/12/2021   Procedure: BRONCHIAL WASHINGS;  Surgeon: Garner Nash, DO;  Location: [facility];  Service: Pulmonary;;   CRYOTHERAPY   05/12/2021   Procedure: CRYOTHERAPY;  Surgeon: Garner Nash, DO;  Location: [facility];  Service: Pulmonary;;   HEMOSTASIS CONTROL  05/12/2021   Procedure: HEMOSTASIS CONTROL;  Surgeon: Garner Nash, DO;  Location: [facility];  Service: Pulmonary;;  epi injection   IR IMAGING GUIDED PORT INSERTION  06/29/2021   Spine injection     Pain control   VIDEO BRONCHOSCOPY WITH ENDOBRONCHIAL ULTRASOUND N/A 05/12/2021   Procedure: VIDEO BRONCHOSCOPY WITH ENDOBRONCHIAL ULTRASOUND;  Surgeon: Garner Nash, DO;  Location: [facility];  Service: Pulmonary;  Laterality: N/A;   WISDOM TOOTH EXTRACTION     no anesthesia involed    REVIEW OF SYSTEMS:   Review of Systems  Constitutional: Negative for appetite change, chills, fever and unexpected weight change.  HENT: Negative for mouth sores (improved), nosebleeds, sore throat and trouble swallowing.   Eyes: Negative for eye problems and icterus.  Respiratory: Positive for mild cough. Negative for hemoptysis, shortness of breath and wheezing.   Cardiovascular: Negative for chest pain and leg swelling.  Gastrointestinal: Negative for abdominal pain, constipation, diarrhea, nausea and vomiting.  Genitourinary: Negative for bladder incontinence, difficulty urinating, dysuria, frequency and hematuria.   Musculoskeletal: Negative  for gait problem, neck pain and neck stiffness.  Skin: Negative for itching and rash.  Neurological: Negative for dizziness, extremity weakness, gait problem, headaches, light-headedness and seizures.  Hematological: Negative for adenopathy. Does not bruise/bleed easily.  Psychiatric/Behavioral: Negative for confusion, depression and sleep disturbance. The patient is not nervous/anxious.    PHYSICAL EXAMINATION:  There were no vitals taken for this visit.  ECOG PERFORMANCE STATUS: 1  Physical Exam  Constitutional: Oriented to person, place, and time and well-developed, well-nourished, and in no distress.  HENT:   Head: Normocephalic and atraumatic.  Mouth/Throat: Oropharynx is clear and moist. Poor dentition and gingival disease. No oropharyngeal exudate.  Eyes: Conjunctivae are normal. Right eye exhibits no discharge. Left eye exhibits no discharge. No scleral icterus.  Neck: Normal range of motion. Neck supple.  Cardiovascular: Normal rate, regular rhythm, normal heart sounds and intact distal pulses.   Pulmonary/Chest: Effort normal and breath sounds normal. No respiratory distress. No wheezes. No rales.  Abdominal: Soft. Bowel sounds are normal. Exhibits no distension and no mass. There is no tenderness.  Musculoskeletal: Normal range of motion. Exhibits no edema. No calf pain to palpation, erythema, or swelling. Lymphadenopathy:    No cervical adenopathy.  Neurological: Alert and oriented to person, place, and time. Exhibits normal muscle tone. Gait normal. Coordination normal.  Skin: Skin is warm and dry. No rash noted. Not diaphoretic. No erythema. No pallor.  Psychiatric: Mood, memory and judgment normal.  Vitals reviewed.  LABORATORY DATA: Lab Results  Component Value Date   WBC 3.7 (L) 09/07/2021   HGB 8.4 (L) 09/07/2021   HCT 24.7 (L) 09/07/2021   MCV 98.4 09/07/2021   PLT 151 09/07/2021      Chemistry      Component Value Date/Time   NA 144 09/07/2021 0954   K 4.3 09/07/2021 0954   CL 109 09/07/2021 0954   CO2 26 09/07/2021 0954   BUN 7 09/07/2021 0954   CREATININE 0.98 09/07/2021 0954      Component Value Date/Time   CALCIUM 9.4 09/07/2021 0954   ALKPHOS 90 09/07/2021 0954   AST 16 09/07/2021 0954   ALT 17 09/07/2021 0954   BILITOT 0.2 (L) 09/07/2021 0954       RADIOGRAPHIC STUDIES:  CT Chest W Contrast  Result Date: 08/15/2021 CLINICAL DATA:  Primary Cancer Type: Lung Imaging Indication: Assess response to therapy Interval therapy since last imaging? Yes Initial Cancer Diagnosis Date: 05/12/2021; Established by: Biopsy-proven Detailed Pathology: Stage IV  non-small cell lung cancer, adenocarcinoma. Primary Tumor location:  Left hilum and central left lower lobe. Surgeries: No. Chemotherapy: Yes; Ongoing? Yes; Most recent administration: 07/27/2021 Immunotherapy?  Yes; Type: Keytruda; Ongoing? Yes Radiation therapy? Yes; Date Range: 05/22/2021 - 06/12/2021; Target: Left lung EXAM: CT CHEST, ABDOMEN, AND PELVIS WITH CONTRAST TECHNIQUE: Multidetector CT imaging of the chest, abdomen and pelvis was performed following the standard protocol during bolus administration of intravenous contrast. CONTRAST:  67mL OMNIPAQUE IOHEXOL 350 MG/ML SOLN COMPARISON:  05/02/2021 PET-CT FINDINGS: CT CHEST FINDINGS Cardiovascular: No acute findings. Aortic atherosclerotic calcification noted. Minimal pericardial fluid again seen in the superior pericardial recess and along the superior margin of the right atrium. Mediastinum/Lymph Nodes: There has been near complete resolution of mediastinal lymphadenopathy since prior study. Largest residual lymph node in the AP window measures 12 mm short axis compared to 21 mm previously. No new lymphadenopathy identified. Lungs/Pleura: Significant decrease in size of soft tissue mass involving the central left lower lobe and left hilum since prior study.  This currently measures 2.4 x 2.1 cm compared to 4.9 by 3.7 cm previously. No other suspicious pulmonary nodules or masses identified. No evidence of pleural effusion. Musculoskeletal:  No suspicious bone lesions identified. CT ABDOMEN AND PELVIS FINDINGS Hepatobiliary: No masses identified. Gallbladder is unremarkable. No evidence of biliary ductal dilatation. Pancreas:  No mass or inflammatory changes. Spleen:  Within normal limits in size and appearance. Adrenals/Urinary tract: Normal appearance of both adrenal glands. Small cyst noted in the upper pole the left kidney. No renal masses or hydronephrosis. Stomach/Bowel: No evidence of obstruction, inflammatory process, or abnormal fluid collections.  Normal appendix visualized. Diverticulosis is seen mainly involving the sigmoid colon, however there is no evidence of diverticulitis. Vascular/Lymphatic: Decreased retroperitoneal lymphadenopathy is seen in the aortocaval space, with largest residual lymph node measuring 9 mm on image 64/2, compared to 21 mm previously. No other Harper of lymphadenopathy identified. Aortic atherosclerotic calcification noted. No acute vascular findings. Reproductive:  No mass or other significant abnormality identified. Other:  None. Musculoskeletal:  No suspicious bone lesions identified. IMPRESSION: Significant decrease in size of mass involving the central left lower lobe and left hilum. Near complete resolution of mediastinal and abdominal retroperitoneal lymphadenopathy. No new or progressive disease within the chest, abdomen, or pelvis. Colonic diverticulosis, without radiographic evidence of diverticulitis. Aortic Atherosclerosis (ICD10-I70.0). Electronically Signed   By: Marlaine Hind M.D.   On: 08/15/2021 11:45   CT Abdomen Pelvis W Contrast  Result Date: 08/15/2021 CLINICAL DATA:  Primary Cancer Type: Lung Imaging Indication: Assess response to therapy Interval therapy since last imaging? Yes Initial Cancer Diagnosis Date: 05/12/2021; Established by: Biopsy-proven Detailed Pathology: Stage IV non-small cell lung cancer, adenocarcinoma. Primary Tumor location:  Left hilum and central left lower lobe. Surgeries: No. Chemotherapy: Yes; Ongoing? Yes; Most recent administration: 07/27/2021 Immunotherapy?  Yes; Type: Keytruda; Ongoing? Yes Radiation therapy? Yes; Date Range: 05/22/2021 - 06/12/2021; Target: Left lung EXAM: CT CHEST, ABDOMEN, AND PELVIS WITH CONTRAST TECHNIQUE: Multidetector CT imaging of the chest, abdomen and pelvis was performed following the standard protocol during bolus administration of intravenous contrast. CONTRAST:  20mL OMNIPAQUE IOHEXOL 350 MG/ML SOLN COMPARISON:  05/02/2021 PET-CT FINDINGS: CT CHEST  FINDINGS Cardiovascular: No acute findings. Aortic atherosclerotic calcification noted. Minimal pericardial fluid again seen in the superior pericardial recess and along the superior margin of the right atrium. Mediastinum/Lymph Nodes: There has been near complete resolution of mediastinal lymphadenopathy since prior study. Largest residual lymph node in the AP window measures 12 mm short axis compared to 21 mm previously. No new lymphadenopathy identified. Lungs/Pleura: Significant decrease in size of soft tissue mass involving the central left lower lobe and left hilum since prior study. This currently measures 2.4 x 2.1 cm compared to 4.9 by 3.7 cm previously. No other suspicious pulmonary nodules or masses identified. No evidence of pleural effusion. Musculoskeletal:  No suspicious bone lesions identified. CT ABDOMEN AND PELVIS FINDINGS Hepatobiliary: No masses identified. Gallbladder is unremarkable. No evidence of biliary ductal dilatation. Pancreas:  No mass or inflammatory changes. Spleen:  Within normal limits in size and appearance. Adrenals/Urinary tract: Normal appearance of both adrenal glands. Small cyst noted in the upper pole the left kidney. No renal masses or hydronephrosis. Stomach/Bowel: No evidence of obstruction, inflammatory process, or abnormal fluid collections. Normal appendix visualized. Diverticulosis is seen mainly involving the sigmoid colon, however there is no evidence of diverticulitis. Vascular/Lymphatic: Decreased retroperitoneal lymphadenopathy is seen in the aortocaval space, with largest residual lymph node measuring 9 mm on image 64/2, compared to  21 mm previously. No other Harper of lymphadenopathy identified. Aortic atherosclerotic calcification noted. No acute vascular findings. Reproductive:  No mass or other significant abnormality identified. Other:  None. Musculoskeletal:  No suspicious bone lesions identified. IMPRESSION: Significant decrease in size of mass involving  the central left lower lobe and left hilum. Near complete resolution of mediastinal and abdominal retroperitoneal lymphadenopathy. No new or progressive disease within the chest, abdomen, or pelvis. Colonic diverticulosis, without radiographic evidence of diverticulitis. Aortic Atherosclerosis (ICD10-I70.0). Electronically Signed   By: Marlaine Hind M.D.   On: 08/15/2021 11:45     ASSESSMENT/PLAN:  This is a very pleasant 45 year old Caucasian male diagnosed with stage IV (T2b, N2, M1 B) non-small cell lung cancer, adenocarcinoma.  He presented with a large central left lower lobe lung mass in addition to left hilar mediastinal lymphadenopathy.  He also has retroperitoneal abdominal lymphadenopathy.  He was diagnosed in May 2022.  His PD-L1 expression is 50%.  His molecular studies by [facility] did not show any evidence of any actionable mutations.  He underwent palliative radiotherapy to the large left lower lobe lung mass under the care of Dr. Lisbeth Harper.  He completed this on 06/12/2021.   The patient is currently undergoing systemic chemotherapy with carboplatin for an AUC of 5, Alimta 500 mg per metered squared, and Keytruda 200 mg IV every 3 weeks.  He is status post 4 cycles.  Starting from cycle #5, the patient will be on maintenance Alimta and Keytruda.  The patient reports that he feels back to his baseline and is no longer symptomatic from his recent COVID-19 infection.  Labs reviewed.  Recommend that he proceed with cycle #5 today scheduled.  Starting from today, the patient will start maintenance Alimta and Keytruda.  He will proceed with cycle #5 today scheduled.  Because of his hemoglobin of 8.4, we will monitor his labs closely at [facility] next week to ensure that he does not need a blood transfusion.  Discussed with him that, otherwise, moving forward we will no longer require weekly labs.  I have added to sample to blood bank with his blood work next week.   We will see him  back for follow-up visit in 3 weeks for evaluation before starting cycle #5.  He will continue to use Magic mouthwash and salt water rinses if he develops any mucositis in the interval.   The patient was advised to call immediately if he has any concerning symptoms in the interval. The patient voices understanding of current disease status and treatment options and is in agreement with the current care plan. All questions were answered. The patient knows to call the clinic with any problems, questions or concerns. We can certainly see the patient much sooner if necessary   Orders Placed This Encounter  Procedures   Sample to Blood Bank    Standing Status:   Standing    Number of Occurrences:   3    Standing Expiration Date:   09/07/2022     The total time spent in the appointment was 20-29 minutes  Jhene Westmoreland L Jerid Catherman, PA-C 09/07/21

## 2021-08-31 ENCOUNTER — Other Ambulatory Visit (HOSPITAL_COMMUNITY): Payer: Managed Care, Other (non HMO)

## 2021-09-07 ENCOUNTER — Other Ambulatory Visit: Payer: Self-pay

## 2021-09-07 ENCOUNTER — Other Ambulatory Visit: Payer: Managed Care, Other (non HMO)

## 2021-09-07 ENCOUNTER — Inpatient Hospital Stay: Payer: Managed Care, Other (non HMO)

## 2021-09-07 ENCOUNTER — Inpatient Hospital Stay: Payer: Managed Care, Other (non HMO) | Attending: Physician Assistant | Admitting: Physician Assistant

## 2021-09-07 ENCOUNTER — Ambulatory Visit: Payer: Managed Care, Other (non HMO)

## 2021-09-07 ENCOUNTER — Ambulatory Visit: Payer: Managed Care, Other (non HMO) | Admitting: Physician Assistant

## 2021-09-07 VITALS — BP 121/85 | HR 77 | Temp 98.1°F | Resp 17 | Wt 168.4 lb

## 2021-09-07 DIAGNOSIS — Z5111 Encounter for antineoplastic chemotherapy: Secondary | ICD-10-CM | POA: Insufficient documentation

## 2021-09-07 DIAGNOSIS — C3432 Malignant neoplasm of lower lobe, left bronchus or lung: Secondary | ICD-10-CM | POA: Diagnosis present

## 2021-09-07 DIAGNOSIS — Z79899 Other long term (current) drug therapy: Secondary | ICD-10-CM | POA: Diagnosis not present

## 2021-09-07 DIAGNOSIS — Z5112 Encounter for antineoplastic immunotherapy: Secondary | ICD-10-CM | POA: Diagnosis not present

## 2021-09-07 DIAGNOSIS — Z95828 Presence of other vascular implants and grafts: Secondary | ICD-10-CM

## 2021-09-07 DIAGNOSIS — D649 Anemia, unspecified: Secondary | ICD-10-CM | POA: Diagnosis not present

## 2021-09-07 LAB — CBC WITH DIFFERENTIAL (CANCER CENTER ONLY)
Abs Immature Granulocytes: 0.04 10*3/uL (ref 0.00–0.07)
Basophils Absolute: 0 10*3/uL (ref 0.0–0.1)
Basophils Relative: 1 %
Eosinophils Absolute: 0.1 10*3/uL (ref 0.0–0.5)
Eosinophils Relative: 2 %
HCT: 24.7 % — ABNORMAL LOW (ref 39.0–52.0)
Hemoglobin: 8.4 g/dL — ABNORMAL LOW (ref 13.0–17.0)
Immature Granulocytes: 1 %
Lymphocytes Relative: 17 %
Lymphs Abs: 0.6 10*3/uL — ABNORMAL LOW (ref 0.7–4.0)
MCH: 33.5 pg (ref 26.0–34.0)
MCHC: 34 g/dL (ref 30.0–36.0)
MCV: 98.4 fL (ref 80.0–100.0)
Monocytes Absolute: 0.7 10*3/uL (ref 0.1–1.0)
Monocytes Relative: 17 %
Neutro Abs: 2.3 10*3/uL (ref 1.7–7.7)
Neutrophils Relative %: 62 %
Platelet Count: 151 10*3/uL (ref 150–400)
RBC: 2.51 MIL/uL — ABNORMAL LOW (ref 4.22–5.81)
RDW: 18.4 % — ABNORMAL HIGH (ref 11.5–15.5)
WBC Count: 3.7 10*3/uL — ABNORMAL LOW (ref 4.0–10.5)
nRBC: 0 % (ref 0.0–0.2)

## 2021-09-07 LAB — CMP (CANCER CENTER ONLY)
ALT: 17 U/L (ref 0–44)
AST: 16 U/L (ref 15–41)
Albumin: 3.4 g/dL — ABNORMAL LOW (ref 3.5–5.0)
Alkaline Phosphatase: 90 U/L (ref 38–126)
Anion gap: 9 (ref 5–15)
BUN: 7 mg/dL (ref 6–20)
CO2: 26 mmol/L (ref 22–32)
Calcium: 9.4 mg/dL (ref 8.9–10.3)
Chloride: 109 mmol/L (ref 98–111)
Creatinine: 0.98 mg/dL (ref 0.61–1.24)
GFR, Estimated: >60 mL/min (ref >60)
Glucose, Bld: 88 mg/dL (ref 70–99)
Potassium: 4.3 mmol/L (ref 3.5–5.1)
Sodium: 144 mmol/L (ref 135–145)
Total Bilirubin: 0.2 mg/dL — ABNORMAL LOW (ref 0.3–1.2)
Total Protein: 7.1 g/dL (ref 6.5–8.1)

## 2021-09-07 LAB — SAMPLE TO BLOOD BANK

## 2021-09-07 LAB — TSH: TSH: 1.056 u[IU]/mL (ref 0.320–4.118)

## 2021-09-07 MED ORDER — SODIUM CHLORIDE 0.9% FLUSH
10.0000 mL | INTRAVENOUS | Status: DC | PRN
  Administered 2021-09-07: 10 mL

## 2021-09-07 MED ORDER — PROCHLORPERAZINE MALEATE 10 MG PO TABS
10.0000 mg | ORAL_TABLET | Freq: Once | ORAL | Status: AC
  Administered 2021-09-07: 10 mg via ORAL

## 2021-09-07 MED ORDER — SODIUM CHLORIDE 0.9 % IV SOLN
200.0000 mg | Freq: Once | INTRAVENOUS | Status: AC
  Administered 2021-09-07: 200 mg via INTRAVENOUS
  Filled 2021-09-07: qty 8

## 2021-09-07 MED ORDER — HEPARIN SOD (PORK) LOCK FLUSH 100 UNIT/ML IV SOLN
500.0000 [IU] | Freq: Once | INTRAVENOUS | Status: AC | PRN
  Administered 2021-09-07: 500 [IU]

## 2021-09-07 MED ORDER — SODIUM CHLORIDE 0.9% FLUSH: 10.0000 mL | Freq: Once | INTRAVENOUS | Status: DC

## 2021-09-07 MED ORDER — SODIUM CHLORIDE 0.9 % IV SOLN: Freq: Once | INTRAVENOUS | Status: AC

## 2021-09-07 MED ORDER — SODIUM CHLORIDE 0.9 % IV SOLN
500.0000 mg/m2 | Freq: Once | INTRAVENOUS | Status: AC
  Administered 2021-09-07: 1000 mg via INTRAVENOUS
  Filled 2021-09-07: qty 40

## 2021-09-07 NOTE — Patient Instructions (Signed)
[facility]  Discharge Instructions: Thank you for choosing [facility] to provide your oncology and hematology care.   If you have a lab appointment with the Cancer Center, please go directly to the Cancer Center and check in at the registration area.   Wear comfortable clothing and clothing appropriate for easy access to any Portacath or PICC line.   We strive to give you quality time with your provider. You may need to reschedule your appointment if you arrive late (15 or more minutes).  Arriving late affects you and other patients whose appointments are after yours.  Also, if you miss three or more appointments without notifying the office, you may be dismissed from the clinic at the provider's discretion.      For prescription refill requests, have your pharmacy contact our office and allow 72 hours for refills to be completed.    Today you received the following chemotherapy and/or immunotherapy agents Pembrolizumab (Keytruda) and Pemetrexed (Alimta)      To help prevent nausea and vomiting after your treatment, we encourage you to take your nausea medication as directed.  BELOW ARE SYMPTOMS THAT SHOULD BE REPORTED IMMEDIATELY: *FEVER GREATER THAN 100.4 F (38 C) OR HIGHER *CHILLS OR SWEATING *NAUSEA AND VOMITING THAT IS NOT CONTROLLED WITH YOUR NAUSEA MEDICATION *UNUSUAL SHORTNESS OF BREATH *UNUSUAL BRUISING OR BLEEDING *URINARY PROBLEMS (pain or burning when urinating, or frequent urination) *BOWEL PROBLEMS (unusual diarrhea, constipation, pain near the anus) TENDERNESS IN MOUTH AND THROAT WITH OR WITHOUT PRESENCE OF ULCERS (sore throat, sores in mouth, or a toothache) UNUSUAL RASH, SWELLING OR PAIN  UNUSUAL VAGINAL DISCHARGE OR ITCHING   Items with * indicate a potential emergency and should be followed up as soon as possible or go to the Emergency Department if any problems should occur.  Please show the CHEMOTHERAPY ALERT CARD or  IMMUNOTHERAPY ALERT CARD at check-in to the Emergency Department and triage nurse.  Should you have questions after your visit or need to cancel or reschedule your appointment, please contact [facility]  Dept: [phone]  and follow the prompts.  Office hours are 8:00 a.m. to 4:30 p.m. Monday - Friday. Please note that voicemails left after 4:00 p.m. may not be returned until the following business day.  We are closed weekends and major holidays. You have access to a nurse at all times for urgent questions. Please call the main number to the clinic Dept: [phone] and follow the prompts.   For any non-urgent questions, you may also contact your provider using MyChart. We now offer e-Visits for anyone 18 and older to request care online for non-urgent symptoms. For details visit [email or web address].   Also download the MyChart app! Go to the app store, search "MyChart", open the app, select [facility], and log in with your MyChart username and password.  Due to Covid, a mask is required upon entering the hospital/clinic. If you do not have a mask, one will be given to you upon arrival. For doctor visits, patients may have 1 support person aged 18 or older with them. For treatment visits, patients cannot have anyone with them due to current Covid guidelines and our immunocompromised population.   

## 2021-09-11 ENCOUNTER — Telehealth: Payer: Self-pay | Admitting: *Deleted

## 2021-09-11 ENCOUNTER — Inpatient Hospital Stay (HOSPITAL_COMMUNITY): Payer: Managed Care, Other (non HMO) | Attending: Hematology

## 2021-09-11 ENCOUNTER — Telehealth: Payer: Self-pay | Admitting: Medical Oncology

## 2021-09-11 ENCOUNTER — Other Ambulatory Visit: Payer: Self-pay | Admitting: Physician Assistant

## 2021-09-11 DIAGNOSIS — Z87891 Personal history of nicotine dependence: Secondary | ICD-10-CM | POA: Insufficient documentation

## 2021-09-11 DIAGNOSIS — C3432 Malignant neoplasm of lower lobe, left bronchus or lung: Secondary | ICD-10-CM

## 2021-09-11 DIAGNOSIS — C799 Secondary malignant neoplasm of unspecified site: Secondary | ICD-10-CM | POA: Diagnosis not present

## 2021-09-11 DIAGNOSIS — D649 Anemia, unspecified: Secondary | ICD-10-CM

## 2021-09-11 LAB — COMPREHENSIVE METABOLIC PANEL
ALT: 20 U/L (ref 0–44)
AST: 26 U/L (ref 15–41)
Albumin: 3.6 g/dL (ref 3.5–5.0)
Alkaline Phosphatase: 82 U/L (ref 38–126)
Anion gap: 11 (ref 5–15)
BUN: 12 mg/dL (ref 6–20)
CO2: 22 mmol/L (ref 22–32)
Calcium: 8.7 mg/dL — ABNORMAL LOW (ref 8.9–10.3)
Chloride: 105 mmol/L (ref 98–111)
Creatinine, Ser: 0.97 mg/dL (ref 0.61–1.24)
GFR, Estimated: >60 mL/min (ref >60)
Glucose, Bld: 113 mg/dL — ABNORMAL HIGH (ref 70–99)
Potassium: 3.6 mmol/L (ref 3.5–5.1)
Sodium: 138 mmol/L (ref 135–145)
Total Bilirubin: 0.7 mg/dL (ref 0.3–1.2)
Total Protein: 7.2 g/dL (ref 6.5–8.1)

## 2021-09-11 LAB — TSH: TSH: 1.141 u[IU]/mL (ref 0.350–4.500)

## 2021-09-11 LAB — CBC
HCT: 27.3 % — ABNORMAL LOW (ref 39.0–52.0)
Hemoglobin: 8.9 g/dL — ABNORMAL LOW (ref 13.0–17.0)
MCH: 34.4 pg — ABNORMAL HIGH (ref 26.0–34.0)
MCHC: 32.6 g/dL (ref 30.0–36.0)
MCV: 105.4 fL — ABNORMAL HIGH (ref 80.0–100.0)
Platelets: 288 10*3/uL (ref 150–400)
RBC: 2.59 MIL/uL — ABNORMAL LOW (ref 4.22–5.81)
RDW: 17.4 % — ABNORMAL HIGH (ref 11.5–15.5)
WBC: 4.3 10*3/uL (ref 4.0–10.5)
nRBC: 0 % (ref 0.0–0.2)

## 2021-09-11 LAB — DIFFERENTIAL
Abs Immature Granulocytes: 0.01 10*3/uL (ref 0.00–0.07)
Basophils Absolute: 0 10*3/uL (ref 0.0–0.1)
Basophils Relative: 1 %
Eosinophils Absolute: 0 10*3/uL (ref 0.0–0.5)
Eosinophils Relative: 1 %
Immature Granulocytes: 0 %
Lymphocytes Relative: 11 %
Lymphs Abs: 0.5 10*3/uL — ABNORMAL LOW (ref 0.7–4.0)
Monocytes Absolute: 0.1 10*3/uL (ref 0.1–1.0)
Monocytes Relative: 3 %
Neutro Abs: 3.6 10*3/uL (ref 1.7–7.7)
Neutrophils Relative %: 84 %

## 2021-09-11 NOTE — Telephone Encounter (Signed)
Pt notified of HGB results. I instructed  him to call for worsening weakness, dizziness and fatigue.

## 2021-09-11 NOTE — Telephone Encounter (Signed)
Wife states Cameron Harper is very weak, SOB and has skin discoloration. Hgb was 8.4 last week. Are wanting labs draw today if possible to see if he needs any blood. Appt being arranged at [facility] today with possible transfusion on Wednesday.

## 2021-09-12 ENCOUNTER — Encounter: Payer: Self-pay | Admitting: Physician Assistant

## 2021-09-12 ENCOUNTER — Encounter: Payer: Self-pay | Admitting: Internal Medicine

## 2021-09-12 ENCOUNTER — Other Ambulatory Visit (HOSPITAL_COMMUNITY): Payer: Managed Care, Other (non HMO)

## 2021-09-12 LAB — SAMPLE TO BLOOD BANK

## 2021-09-14 ENCOUNTER — Other Ambulatory Visit: Payer: Self-pay

## 2021-09-14 ENCOUNTER — Ambulatory Visit (HOSPITAL_COMMUNITY)
Admission: RE | Admit: 2021-09-14 | Discharge: 2021-09-14 | Disposition: A | Discharge status: Home / Self Care | Payer: Managed Care, Other (non HMO) | Source: Ambulatory Visit | Attending: Physician Assistant | Admitting: Physician Assistant

## 2021-09-14 ENCOUNTER — Telehealth: Payer: Self-pay | Admitting: Physician Assistant

## 2021-09-14 ENCOUNTER — Telehealth: Payer: Self-pay | Admitting: Medical Oncology

## 2021-09-14 ENCOUNTER — Other Ambulatory Visit: Payer: Self-pay | Admitting: Physician Assistant

## 2021-09-14 DIAGNOSIS — R053 Chronic cough: Secondary | ICD-10-CM | POA: Diagnosis not present

## 2021-09-14 DIAGNOSIS — C3432 Malignant neoplasm of lower lobe, left bronchus or lung: Secondary | ICD-10-CM | POA: Insufficient documentation

## 2021-09-14 NOTE — Telephone Encounter (Signed)
Per Dr Julien Nordmann CXR at [facility] today and go to ED if symptoms worsen.  Pt notified.

## 2021-09-14 NOTE — Telephone Encounter (Signed)
I called him to let him know his chest xray did not show any acute cardiopulmonary disease to explain his cough other than he recently had COVID. No pneumonia, enlarging lung mass, or fluid was appreciated. Advised to try to use OTC cough medication such as delsym or robitussin and to call back if he has any new or worsening symptoms. He noted that his cough did start improving today.

## 2021-09-14 NOTE — Telephone Encounter (Signed)
"  Coughing terribly" with chest tightness since Sunday. He had low grade fever on Sunday     States it is a dry cough , better when he lays down.  Started Sunday and not any worse.  Wife asking ig CXR would be appropriate? He is going out of town Sunday .  Pleases advise.

## 2021-09-27 ENCOUNTER — Telehealth: Payer: Self-pay | Admitting: Medical Oncology

## 2021-09-27 ENCOUNTER — Other Ambulatory Visit: Payer: Self-pay

## 2021-09-27 ENCOUNTER — Encounter: Payer: Self-pay | Admitting: Internal Medicine

## 2021-09-27 ENCOUNTER — Inpatient Hospital Stay: Payer: Managed Care, Other (non HMO)

## 2021-09-27 ENCOUNTER — Inpatient Hospital Stay (HOSPITAL_BASED_OUTPATIENT_CLINIC_OR_DEPARTMENT_OTHER): Payer: Managed Care, Other (non HMO) | Admitting: Internal Medicine

## 2021-09-27 VITALS — BP 106/53 | HR 72 | Temp 98.5°F | Resp 17 | Ht 69.0 in | Wt 168.0 lb

## 2021-09-27 DIAGNOSIS — D649 Anemia, unspecified: Secondary | ICD-10-CM

## 2021-09-27 DIAGNOSIS — Z5112 Encounter for antineoplastic immunotherapy: Secondary | ICD-10-CM | POA: Diagnosis not present

## 2021-09-27 DIAGNOSIS — C3490 Malignant neoplasm of unspecified part of unspecified bronchus or lung: Secondary | ICD-10-CM

## 2021-09-27 DIAGNOSIS — C3432 Malignant neoplasm of lower lobe, left bronchus or lung: Secondary | ICD-10-CM | POA: Diagnosis not present

## 2021-09-27 DIAGNOSIS — Z5111 Encounter for antineoplastic chemotherapy: Secondary | ICD-10-CM

## 2021-09-27 DIAGNOSIS — Z95828 Presence of other vascular implants and grafts: Secondary | ICD-10-CM

## 2021-09-27 LAB — CMP (CANCER CENTER ONLY)
ALT: 28 U/L (ref 0–44)
AST: 22 U/L (ref 15–41)
Albumin: 3.3 g/dL — ABNORMAL LOW (ref 3.5–5.0)
Alkaline Phosphatase: 88 U/L (ref 38–126)
Anion gap: 11 (ref 5–15)
BUN: 8 mg/dL (ref 6–20)
CO2: 25 mmol/L (ref 22–32)
Calcium: 9.5 mg/dL (ref 8.9–10.3)
Chloride: 105 mmol/L (ref 98–111)
Creatinine: 0.9 mg/dL (ref 0.61–1.24)
GFR, Estimated: >60 mL/min (ref >60)
Glucose, Bld: 80 mg/dL (ref 70–99)
Potassium: 4.1 mmol/L (ref 3.5–5.1)
Sodium: 141 mmol/L (ref 135–145)
Total Bilirubin: 0.3 mg/dL (ref 0.3–1.2)
Total Protein: 7 g/dL (ref 6.5–8.1)

## 2021-09-27 LAB — CBC WITH DIFFERENTIAL (CANCER CENTER ONLY)
Abs Immature Granulocytes: 0.06 10*3/uL (ref 0.00–0.07)
Basophils Absolute: 0.1 10*3/uL (ref 0.0–0.1)
Basophils Relative: 1 %
Eosinophils Absolute: 0.1 10*3/uL (ref 0.0–0.5)
Eosinophils Relative: 2 %
HCT: 26.8 % — ABNORMAL LOW (ref 39.0–52.0)
Hemoglobin: 8.9 g/dL — ABNORMAL LOW (ref 13.0–17.0)
Immature Granulocytes: 1 %
Lymphocytes Relative: 14 %
Lymphs Abs: 0.9 10*3/uL (ref 0.7–4.0)
MCH: 33.8 pg (ref 26.0–34.0)
MCHC: 33.2 g/dL (ref 30.0–36.0)
MCV: 101.9 fL — ABNORMAL HIGH (ref 80.0–100.0)
Monocytes Absolute: 1.1 10*3/uL — ABNORMAL HIGH (ref 0.1–1.0)
Monocytes Relative: 18 %
Neutro Abs: 4 10*3/uL (ref 1.7–7.7)
Neutrophils Relative %: 64 %
Platelet Count: 420 10*3/uL — ABNORMAL HIGH (ref 150–400)
RBC: 2.63 MIL/uL — ABNORMAL LOW (ref 4.22–5.81)
RDW: 16.7 % — ABNORMAL HIGH (ref 11.5–15.5)
WBC Count: 6.2 10*3/uL (ref 4.0–10.5)
nRBC: 0 % (ref 0.0–0.2)

## 2021-09-27 LAB — SAMPLE TO BLOOD BANK

## 2021-09-27 MED ORDER — SODIUM CHLORIDE 0.9 % IV SOLN: Freq: Once | INTRAVENOUS | Status: AC

## 2021-09-27 MED ORDER — SODIUM CHLORIDE 0.9% FLUSH
10.0000 mL | Freq: Once | INTRAVENOUS | Status: AC
  Administered 2021-09-27: 10 mL

## 2021-09-27 MED ORDER — SODIUM CHLORIDE 0.9 % IV SOLN
500.0000 mg/m2 | Freq: Once | INTRAVENOUS | Status: AC
  Administered 2021-09-27: 1000 mg via INTRAVENOUS
  Filled 2021-09-27: qty 40

## 2021-09-27 MED ORDER — SODIUM CHLORIDE 0.9 % IV SOLN
200.0000 mg | Freq: Once | INTRAVENOUS | Status: AC
  Administered 2021-09-27: 200 mg via INTRAVENOUS
  Filled 2021-09-27: qty 8

## 2021-09-27 MED ORDER — CYANOCOBALAMIN 1000 MCG/ML IJ SOLN
1000.0000 ug | Freq: Once | INTRAMUSCULAR | Status: AC
  Administered 2021-09-27: 1000 ug via INTRAMUSCULAR
  Filled 2021-09-27: qty 1

## 2021-09-27 MED ORDER — SODIUM CHLORIDE 0.9% FLUSH
10.0000 mL | INTRAVENOUS | Status: DC | PRN
Start: 2021-09-27 — End: 2021-09-27
  Administered 2021-09-27: 10 mL

## 2021-09-27 MED ORDER — PROCHLORPERAZINE MALEATE 10 MG PO TABS
10.0000 mg | ORAL_TABLET | Freq: Once | ORAL | Status: AC
  Administered 2021-09-27: 10 mg via ORAL
  Filled 2021-09-27: qty 1

## 2021-09-27 MED ORDER — HEPARIN SOD (PORK) LOCK FLUSH 100 UNIT/ML IV SOLN
500.0000 [IU] | Freq: Once | INTRAVENOUS | Status: AC | PRN
  Administered 2021-09-27: 500 [IU]

## 2021-09-27 NOTE — Progress Notes (Signed)
[facility] [phone]   [phone]  OFFICE PROGRESS NOTE  [name], MD [place] [address]  DIAGNOSIS:  Stage IV (T2b, N2, M1 B) non-small cell lung cancer, adenocarcinoma presented with large central left lower lobe lung mass with left hilar and mediastinal lymphadenopathy as well as abdominal retroperitoneal lymphadenopathy diagnosed in May 2022.  The patient also has bilateral parotid gland nodules that need close monitoring.  [facility] MOLECULAR STUDY: Results with Therapy Associations BIOMARKER METHOD ANALYTE RESULT THERAPY ASSOCIATION BIOMARKER LEVEL* .PD-L1 (22c3) IHC Protein Positive, TPS: 50% BENEFIT cemiplimab, pembrolizumab Level 1 .PD-L1 (28-8) IHC Protein Positive  1+, 50% BENEFIT nivolumab/ipilimumab combination Level 1 .TMB Seq DNA-Tumor High, 18 mut/Mb BENEFIT pembrolizumab Level 2 . alectinib, ceritinib, crizotinib, lorlatinib Level 1 . IHC Protein Negative  0 brigatinib Level 2 . ALK Seq RNA-Tumor Fusion Not Detected LACK OF BENEFIT alectinib, brigatinib, ceritinib, crizotinib, lorlatinib Level 2 .BRAF Seq DNA-Tumor Mutation Not Detected LACK OF BENEFIT dabrafenib and trametinib combination therapy, vemurafenib Level 2 .EGFR Seq DNA-Tumor Mutation Not Detected LACK OF BENEFIT erlotinib, gefitinib Level 2 .KRAS Seq DNA-Tumor Mutation Not Detected LACK OF BENEFIT sotorasib Level 2 .RET Seq RNA-Tumor Fusion Not Detected LACK OF BENEFIT pralsetinib, selpercatinib Level 2 .ROS1 Seq RNA-Tumor Fusion Not Detected LACK OF BENEFIT ceritinib, crizotinib, entrectinib, lorlatinib Level 2 . CNA-Seq DNA-Tumor Amplification Not Detected . MET Seq RNA-Tumor Variant Transcript Not Detected LACK OF BENEFIT crizotinib Level 3  PRIOR THERAPY: Palliative radiotherapy to the large central left lower lobe lung mass under the care of Dr. Lisbeth Renshaw.  CURRENT THERAPY: Systemic chemotherapy with carboplatin  for AUC of 5, Alimta 500 Mg/M2 and possibly Keytruda 200 Mg IV every 3 weeks.  First dose expected June 14, 2021.  Status post 5 cycles of treatment.  INTERVAL HISTORY: Cameron Harper 45 y.o. male returns to the clinic today for follow-up visit.  The patient is feeling fine today with no concerning complaints.  He tolerated the last cycle of his treatment fairly well with no concerning adverse effects.  He denied having any chest pain, shortness of breath, cough or hemoptysis.  He denied having any fever or chills.  He has no nausea, vomiting, diarrhea or constipation.  He denied having any headache or visual changes.  He has no weight loss or night sweats.  He is here today for evaluation before starting cycle #6 of his treatment.  MEDICAL HISTORY: Past Medical History:  Diagnosis Date   Chronic back pain    Eczema    GERD (gastroesophageal reflux disease)    Lung cancer (HCC)     ALLERGIES:  is allergic to penicillins.  MEDICATIONS:  Current Outpatient Medications  Medication Sig Dispense Refill   acetaminophen (TYLENOL) 500 MG tablet Take 500-1,000 mg by mouth every 6 (six) hours as needed (for back pain.).     folic acid (FOLVITE) 1 MG tablet Take 1 tablet (1 mg total) by mouth daily. 30 tablet 4   lidocaine-prilocaine (EMLA) cream Apply 1 application topically as needed. 30 g 2   magic mouthwash SOLN Take 5 mLs by mouth 3 (three) times daily as needed for mouth pain. 240 mL 0   prochlorperazine (COMPAZINE) 10 MG tablet Take 1 tablet (10 mg total) by mouth every 6 (six) hours as needed for nausea or vomiting. 30 tablet 0   traMADol (ULTRAM) 50 MG tablet Take 50 mg by mouth every 6 (six) hours as needed for severe pain.  zolpidem (AMBIEN) 10 MG tablet Take 5-10 mg by mouth at bedtime as needed for sleep.     No current facility-administered medications for this visit.    SURGICAL HISTORY:  Past Surgical History:  Procedure Laterality Date   BRONCHIAL BRUSHINGS  05/12/2021    Procedure: BRONCHIAL BRUSHINGS;  Surgeon: Garner Nash, DO;  Location: [facility];  Service: Pulmonary;;   BRONCHIAL DILITATION  05/12/2021   Procedure: BRONCHIAL DILITATION;  Surgeon: Garner Nash, DO;  Location: [facility];  Service: Pulmonary;;   BRONCHIAL NEEDLE ASPIRATION BIOPSY  05/12/2021   Procedure: BRONCHIAL NEEDLE ASPIRATION BIOPSIES;  Surgeon: Garner Nash, DO;  Location: [facility];  Service: Pulmonary;;   BRONCHIAL WASHINGS  05/12/2021   Procedure: BRONCHIAL WASHINGS;  Surgeon: Garner Nash, DO;  Location: [facility];  Service: Pulmonary;;   CRYOTHERAPY  05/12/2021   Procedure: CRYOTHERAPY;  Surgeon: Garner Nash, DO;  Location: [facility];  Service: Pulmonary;;   HEMOSTASIS CONTROL  05/12/2021   Procedure: HEMOSTASIS CONTROL;  Surgeon: Garner Nash, DO;  Location: [facility];  Service: Pulmonary;;  epi injection   IR IMAGING GUIDED PORT INSERTION  06/29/2021   Spine injection     Pain control   VIDEO BRONCHOSCOPY WITH ENDOBRONCHIAL ULTRASOUND N/A 05/12/2021   Procedure: VIDEO BRONCHOSCOPY WITH ENDOBRONCHIAL ULTRASOUND;  Surgeon: Garner Nash, DO;  Location: [facility];  Service: Pulmonary;  Laterality: N/A;   WISDOM TOOTH EXTRACTION     no anesthesia involed    REVIEW OF SYSTEMS:  A comprehensive review of systems was negative.   PHYSICAL EXAMINATION: General appearance: alert, cooperative, and no distress Head: Normocephalic, without obvious abnormality, atraumatic Neck: no adenopathy, no JVD, supple, symmetrical, trachea midline, and thyroid not enlarged, symmetric, no tenderness/mass/nodules Lymph nodes: Cervical, supraclavicular, and axillary nodes normal. Resp: clear to auscultation bilaterally Back: symmetric, no curvature. ROM normal. No CVA tenderness. Cardio: regular rate and rhythm, S1, S2 normal, no murmur, click, rub or gallop GI: soft, non-tender; bowel sounds normal; no masses,  no organomegaly Extremities: extremities  normal, atraumatic, no cyanosis or edema  ECOG PERFORMANCE STATUS: 1 - Symptomatic but completely ambulatory  Blood pressure (!) 106/53, pulse 72, temperature 98.5 F (36.9 C), temperature source Temporal, resp. rate 17, height _0  (1.753 m), weight 168 lb (76.2 kg), SpO2 100 %.  LABORATORY DATA: Lab Results  Component Value Date   WBC 6.2 09/27/2021   HGB 8.9 (L) 09/27/2021   HCT 26.8 (L) 09/27/2021   MCV 101.9 (H) 09/27/2021   PLT 420 (H) 09/27/2021      Chemistry      Component Value Date/Time   NA 138 09/11/2021 1242   K 3.6 09/11/2021 1242   CL 105 09/11/2021 1242   CO2 22 09/11/2021 1242   BUN 12 09/11/2021 1242   CREATININE 0.97 09/11/2021 1242   CREATININE 0.98 09/07/2021 0954      Component Value Date/Time   CALCIUM 8.7 (L) 09/11/2021 1242   ALKPHOS 82 09/11/2021 1242   AST 26 09/11/2021 1242   AST 16 09/07/2021 0954   ALT 20 09/11/2021 1242   ALT 17 09/07/2021 0954   BILITOT 0.7 09/11/2021 1242   BILITOT 0.2 (L) 09/07/2021 0954       RADIOGRAPHIC STUDIES: DG Chest 2 View  Result Date: 09/14/2021 CLINICAL DATA:  Cough.  History of lung cancer EXAM: CHEST - 2 VIEW COMPARISON:  05/12/2021, 06/14/2021 FINDINGS: Right IJ approach chest port terminates within the SVC. Normal heart size. Scarring/post treatment changes  in the left perihilar region, better seen on recent CT. Lungs appear otherwise clear. No pleural effusion or pneumothorax. IMPRESSION: No active cardiopulmonary disease. Electronically Signed   By: Davina Poke D.O.   On: 09/14/2021 13:22    ASSESSMENT AND PLAN: This is a very pleasant 45 years old white male recently diagnosed with a stage IV (T2b, N2, M1 B) non-small cell lung cancer, adenocarcinoma presented with large central left lower lobe lung mass in addition to left hilar and mediastinal lymphadenopathy as well as retroperitoneal abdominal lymph node diagnosed in May 2022.  The patient is currently undergoing palliative radiotherapy to  the large left lower lobe lung mass under the care of of Dr. Lisbeth Renshaw.  He is expected to complete this course of treatment on June 12, 2021. MRI of the brain showed no evidence of metastatic disease to the brain. His molecular studies by [facility] showed no actionable mutations and PD-L1 expression of 50%. He is currently undergoing systemic chemotherapy with carboplatin for AUC of 5, Alimta 500 Mg/M2 and Keytruda 200 Mg IV every 3 weeks.  Starting from cycle #5 he is on maintenance treatment with Alimta and Keytruda every 3 weeks status post 5 cycles. The patient is feeling fine today with no concerning complaints. I recommended for him to proceed with cycle #6 today as planned. I will see him back for follow-up visit in 3 weeks for evaluation with repeat CT scan of the chest, abdomen pelvis for restaging of his disease. The patient was advised to call immediately if he has any other concerning symptoms in the interval. The patient voices understanding of current disease status and treatment options and is in agreement with the current care plan. All questions were answered. The patient knows to call the clinic with any problems, questions or concerns. We can certainly see the patient much sooner if necessary.  Disclaimer: This note was dictated with voice recognition software. Similar sounding words can inadvertently be transcribed and may not be corrected upon review.

## 2021-09-27 NOTE — Telephone Encounter (Signed)
FMLA forms placed in folder for Roz or Jeri.

## 2021-09-27 NOTE — Patient Instructions (Signed)
[facility]  Discharge Instructions: Thank you for choosing [facility] to provide your oncology and hematology care.   If you have a lab appointment with the [facility], please go directly to the [facility] and check in at the registration area.   Wear comfortable clothing and clothing appropriate for easy access to any Portacath or PICC line.   We strive to give you quality time with your provider. You may need to reschedule your appointment if you arrive late (15 or more minutes).  Arriving late affects you and other patients whose appointments are after yours.  Also, if you miss three or more appointments without notifying the office, you may be dismissed from the clinic at the provider's discretion.      For prescription refill requests, have your pharmacy contact our office and allow 72 hours for refills to be completed.    Today you received the following chemotherapy and/or immunotherapy agents : Keytruda, Alimta      To help prevent nausea and vomiting after your treatment, we encourage you to take your nausea medication as directed.  BELOW ARE SYMPTOMS THAT SHOULD BE REPORTED IMMEDIATELY: *FEVER GREATER THAN 100.4 F (38 C) OR HIGHER *CHILLS OR SWEATING *NAUSEA AND VOMITING THAT IS NOT CONTROLLED WITH YOUR NAUSEA MEDICATION *UNUSUAL SHORTNESS OF BREATH *UNUSUAL BRUISING OR BLEEDING *URINARY PROBLEMS (pain or burning when urinating, or frequent urination) *BOWEL PROBLEMS (unusual diarrhea, constipation, pain near the anus) TENDERNESS IN MOUTH AND THROAT WITH OR WITHOUT PRESENCE OF ULCERS (sore throat, sores in mouth, or a toothache) UNUSUAL RASH, SWELLING OR PAIN  UNUSUAL VAGINAL DISCHARGE OR ITCHING   Items with * indicate a potential emergency and should be followed up as soon as possible or go to the Emergency Department if any problems should occur.  Please show the CHEMOTHERAPY ALERT CARD or IMMUNOTHERAPY ALERT CARD at  check-in to the Emergency Department and triage nurse.  Should you have questions after your visit or need to cancel or reschedule your appointment, please contact [facility]  Dept: [phone]  and follow the prompts.  Office hours are 8:00 a.m. to 4:30 p.m. Monday - Friday. Please note that voicemails left after 4:00 p.m. may not be returned until the following business day.  We are closed weekends and major holidays. You have access to a nurse at all times for urgent questions. Please call the main number to the clinic Dept: [phone] and follow the prompts.   For any non-urgent questions, you may also contact your provider using MyChart. We now offer e-Visits for anyone 23 and older to request care online for non-urgent symptoms. For details visit [email or web address].   Also download the MyChart app! Go to the app store, search "MyChart", open the app, select [facility], and log in with your MyChart username and password.  Due to Covid, a mask is required upon entering the hospital/clinic. If you do not have a mask, one will be given to you upon arrival. For doctor visits, patients may have 1 support person aged 2 or older with them. For treatment visits, patients cannot have anyone with them due to current Covid guidelines and our immunocompromised population.

## 2021-09-29 ENCOUNTER — Telehealth: Payer: Self-pay | Admitting: Internal Medicine

## 2021-09-29 NOTE — Telephone Encounter (Signed)
Scheduled follow-up appointments per 10/26 los. Patient is aware. 

## 2021-10-14 NOTE — Progress Notes (Signed)
[facility] OFFICE PROGRESS NOTE  [name], MD [place] [address]  DIAGNOSIS:  Stage IV (T2b, N2, M1 B) non-small cell lung cancer, adenocarcinoma presented with large central left lower lobe lung mass with left hilar and mediastinal lymphadenopathy as well as abdominal retroperitoneal lymphadenopathy diagnosed in May 2022.  The patient also has bilateral parotid gland nodules that need close monitoring.   [facility] MOLECULAR STUDY: Results with Therapy Associations BIOMARKER METHOD ANALYTE RESULT THERAPY ASSOCIATION BIOMARKER LEVEL* .PD-L1 (22c3) IHC Protein Positive, TPS: 50% BENEFIT cemiplimab, pembrolizumab Level 1 .PD-L1 (28-8) IHC Protein Positive  1+, 50% BENEFIT nivolumab/ipilimumab combination Level 1 .TMB Seq DNA-Tumor High, 18 mut/Mb BENEFIT pembrolizumab Level 2 . alectinib, ceritinib, crizotinib, lorlatinib Level 1 . IHC Protein Negative  0 brigatinib Level 2 . ALK Seq RNA-Tumor Fusion Not Detected LACK OF BENEFIT alectinib, brigatinib, ceritinib, crizotinib, lorlatinib Level 2 .BRAF Seq DNA-Tumor Mutation Not Detected LACK OF BENEFIT dabrafenib and trametinib combination therapy, vemurafenib Level 2 .EGFR Seq DNA-Tumor Mutation Not Detected LACK OF BENEFIT erlotinib, gefitinib Level 2 .KRAS Seq DNA-Tumor Mutation Not Detected LACK OF BENEFIT sotorasib Level 2 .RET Seq RNA-Tumor Fusion Not Detected LACK OF BENEFIT pralsetinib, selpercatinib Level 2 .ROS1 Seq RNA-Tumor Fusion Not Detected LACK OF BENEFIT ceritinib, crizotinib, entrectinib, lorlatinib Level 2 . CNA-Seq DNA-Tumor Amplification Not Detected . MET Seq RNA-Tumor Variant Transcript Not Detected LACK OF BENEFIT crizotinib Level 3  PRIOR THERAPY:  Palliative radiotherapy to the large central left lower lobe lung mass under the care of Dr. Lisbeth Renshaw. Last treatment on 06/12/21.  CURRENT THERAPY: Systemic chemotherapy with carboplatin for AUC of 5, Alimta 500  Mg/M2 and Keytruda 200 Mg IV every 3 weeks.  First dose expected June 14, 2021.  Status post 6 cycles of treatment.  Starting from cycle #5, the patient will begin maintenance Keytruda and Alimta.  INTERVAL HISTORY: Cameron Harper 45 y.o. male returns to the clinic today for a follow-up visit. He is tolerating maintenance alimta and keytruda well without any concerning adverse side effects except for fatigue starting on day 3 after treatment which lingers for a few days. He denies any recent fever, chills, or night sweats. He is weight is stable. He denies any significant chest pain or hemoptysis.  He notes some mild dyspnea on exertion, such as walking up a hill which he didn't have prior to his diagnosis. He also notes a mild cough with taking deep breaths or feelings of phlegm in his throat. He is concerned about his scan which incidentally noted emphysema from his prior smoking and a small pleural effusion. He denies any nausea, vomiting, diarrhea, or constipation.  He denies any rashes or skin changes. He denies any headaches or visual changes.  The patient recently had restaging CT scan performed. He is here today for evaluation and to review his scan before starting cycle #7.  MEDICAL HISTORY: Past Medical History:  Diagnosis Date   Chronic back pain    Eczema    GERD (gastroesophageal reflux disease)    Lung cancer (HCC)     ALLERGIES:  is allergic to penicillins.  MEDICATIONS:  Current Outpatient Medications  Medication Sig Dispense Refill   folic acid (FOLVITE) 1 MG tablet Take 1 tablet (1 mg total) by mouth daily. 30 tablet 4   lidocaine-prilocaine (EMLA) cream Apply 1 application topically as needed. 30 g 2   acetaminophen (TYLENOL) 500 MG tablet Take 500-1,000 mg by mouth every 6 (six) hours as needed (for back pain.). (  Patient not taking: Reported on 09/27/2021)     magic mouthwash SOLN Take 5 mLs by mouth 3 (three) times daily as needed for mouth pain. (Patient not taking:  Reported on 09/27/2021) 240 mL 0   prochlorperazine (COMPAZINE) 10 MG tablet Take 1 tablet (10 mg total) by mouth every 6 (six) hours as needed for nausea or vomiting. (Patient not taking: Reported on 10/19/2021) 30 tablet 0   traMADol (ULTRAM) 50 MG tablet Take 50 mg by mouth every 6 (six) hours as needed for severe pain. (Patient not taking: Reported on 09/27/2021)     zolpidem (AMBIEN) 10 MG tablet Take 5-10 mg by mouth at bedtime as needed for sleep. (Patient not taking: Reported on 09/27/2021)     No current facility-administered medications for this visit.    SURGICAL HISTORY:  Past Surgical History:  Procedure Laterality Date   BRONCHIAL BRUSHINGS  05/12/2021   Procedure: BRONCHIAL BRUSHINGS;  Surgeon: Garner Nash, DO;  Location: [facility];  Service: Pulmonary;;   BRONCHIAL DILITATION  05/12/2021   Procedure: BRONCHIAL DILITATION;  Surgeon: Garner Nash, DO;  Location: [facility];  Service: Pulmonary;;   BRONCHIAL NEEDLE ASPIRATION BIOPSY  05/12/2021   Procedure: BRONCHIAL NEEDLE ASPIRATION BIOPSIES;  Surgeon: Garner Nash, DO;  Location: [facility];  Service: Pulmonary;;   BRONCHIAL WASHINGS  05/12/2021   Procedure: BRONCHIAL WASHINGS;  Surgeon: Garner Nash, DO;  Location: [facility];  Service: Pulmonary;;   CRYOTHERAPY  05/12/2021   Procedure: CRYOTHERAPY;  Surgeon: Garner Nash, DO;  Location: [facility];  Service: Pulmonary;;   HEMOSTASIS CONTROL  05/12/2021   Procedure: HEMOSTASIS CONTROL;  Surgeon: Garner Nash, DO;  Location: [facility];  Service: Pulmonary;;  epi injection   IR IMAGING GUIDED PORT INSERTION  06/29/2021   Spine injection     Pain control   VIDEO BRONCHOSCOPY WITH ENDOBRONCHIAL ULTRASOUND N/A 05/12/2021   Procedure: VIDEO BRONCHOSCOPY WITH ENDOBRONCHIAL ULTRASOUND;  Surgeon: Garner Nash, DO;  Location: [facility];  Service: Pulmonary;  Laterality: N/A;   WISDOM TOOTH EXTRACTION     no anesthesia involed    REVIEW OF  SYSTEMS:   Review of Systems  Constitutional: Negative for appetite change, chills, fever and unexpected weight change.  HENT: Negative for mouth sores, nosebleeds, sore throat and trouble swallowing.   Eyes: Negative for eye problems and icterus.  Respiratory: Positive for mild cough and mild dyspnea on exertion. Negative for hemoptysis and wheezing.   Cardiovascular: Negative for chest pain and leg swelling.  Gastrointestinal: Negative for abdominal pain, constipation, diarrhea, nausea and vomiting.  Genitourinary: Negative for bladder incontinence, difficulty urinating, dysuria, frequency and hematuria.   Musculoskeletal: Negative for gait problem, neck pain and neck stiffness.  Skin: Negative for itching and rash.  Neurological: Negative for dizziness, extremity weakness, gait problem, headaches, light-headedness and seizures.  Hematological: Negative for adenopathy. Does not bruise/bleed easily.  Psychiatric/Behavioral: Negative for confusion, depression and sleep disturbance. The patient is not nervous/anxious.    PHYSICAL EXAMINATION:  Blood pressure 132/85, pulse 81, temperature 97.9 F (36.6 C), temperature source Tympanic, resp. rate 17, weight 169 lb 12.8 oz (77 kg), SpO2 100 %.  ECOG PERFORMANCE STATUS: 1  Physical Exam  Constitutional: Oriented to person, place, and time and well-developed, well-nourished, and in no distress.  HENT:  Head: Normocephalic and atraumatic.  Mouth/Throat: Oropharynx is clear and moist. Poor dentition and gingival disease. No oropharyngeal exudate.  Eyes: Conjunctivae are normal. Right eye exhibits no discharge. Left eye exhibits  no discharge. No scleral icterus.  Neck: Normal range of motion. Neck supple.  Cardiovascular: Normal rate, regular rhythm, normal heart sounds and intact distal pulses.   Pulmonary/Chest: Effort normal and breath sounds normal. No respiratory distress. No wheezes. No rales.  Abdominal: Soft. Bowel sounds are normal.  Exhibits no distension and no mass. There is no tenderness.  Musculoskeletal: Normal range of motion. Exhibits no edema. No calf pain to palpation, erythema, or swelling. Lymphadenopathy:    No cervical adenopathy.  Neurological: Alert and oriented to person, place, and time. Exhibits normal muscle tone. Gait normal. Coordination normal.  Skin: Skin is warm and dry. No rash noted. Not diaphoretic. No erythema. No pallor.  Psychiatric: Mood, memory and judgment normal.  Vitals reviewed.  LABORATORY DATA: Lab Results  Component Value Date   WBC 6.3 10/19/2021   HGB 9.7 (L) 10/19/2021   HCT 30.2 (L) 10/19/2021   MCV 102.4 (H) 10/19/2021   PLT 320 10/19/2021      Chemistry      Component Value Date/Time   NA 141 10/19/2021 1016   K 4.0 10/19/2021 1016   CL 106 10/19/2021 1016   CO2 26 10/19/2021 1016   BUN 10 10/19/2021 1016   CREATININE 0.99 10/19/2021 1016      Component Value Date/Time   CALCIUM 9.1 10/19/2021 1016   ALKPHOS 93 10/19/2021 1016   AST 25 10/19/2021 1016   ALT 26 10/19/2021 1016   BILITOT <0.2 (L) 10/19/2021 1016       RADIOGRAPHIC STUDIES:  CT Chest W Contrast  Result Date: 10/16/2021 CLINICAL DATA:  Restaging non-small cell lung cancer. Cough with weight loss. No abdominal complaints. EXAM: CT CHEST, ABDOMEN, AND PELVIS WITH CONTRAST TECHNIQUE: Multidetector CT imaging of the chest, abdomen and pelvis was performed following the standard protocol during bolus administration of intravenous contrast. CONTRAST:  19mL OMNIPAQUE IOHEXOL 350 MG/ML SOLN COMPARISON:  Prior CTs 08/15/2021 and PET-CT 05/02/2021. FINDINGS: CT CHEST FINDINGS Cardiovascular: No acute vascular findings are seen. There is mild aortic atherosclerosis. Right IJ Port-A-Cath extends to the mid SVC. The heart size is normal. A small amount of fluid within the superior pericardial recess appears unchanged. Mediastinum/Nodes: Further improvement in previously demonstrated lymphadenopathy. AP  window node measures approximately 11 mm short axis (previously 12 mm). Posterior left hilar mass measures 1.9 x 1.7 cm on image 28/2 (previously 2.4 x 2.1 cm). No progressive mediastinal, hilar or axillary adenopathy. The thyroid gland, trachea and esophagus demonstrate no significant findings. Lungs/Pleura: Minimal enlargement of a small left pleural effusion without pleural based nodularity. Mild centrilobular and paraseptal emphysema. As above, interval improvement in the left retrohilar mass. No new or enlarging pulmonary nodules. 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 Musculoskeletal/Chest wall: No chest wall mass or suspicious osseous findings. CT ABDOMEN AND PELVIS FINDINGS Hepatobiliary: The liver is normal in density without suspicious focal abnormality. No evidence of gallstones, gallbladder wall thickening or biliary dilatation. Pancreas: Unremarkable. No pancreatic ductal dilatation or surrounding inflammatory changes. Spleen: Normal in size without focal abnormality. Adrenals/Urinary Tract: Both adrenal glands appear normal. Stable cyst in the upper pole of the left kidney. No evidence of enhancing renal mass, urinary tract calculus or hydronephrosis. The bladder appears unremarkable for its degree of distention. Stomach/Bowel: Enteric contrast was administered and has passed into the distal colon. The stomach appears unremarkable for its degree of distension. No evidence of bowel wall thickening, distention or surrounding  inflammatory change. The appendix appears normal. Stable mild diverticular changes within the sigmoid colon. Vascular/Lymphatic: There are no enlarged abdominal or pelvic lymph nodes. Previously demonstrated retroperitoneal lymphadenopathy has resolved. No significant vascular findings. There is minimal aortoiliac atherosclerosis without aneurysm. The portal, superior mesenteric and  splenic veins are patent. Reproductive: The prostate gland and seminal vesicles appear unremarkable. Other: No ascites or peritoneal nodularity.  Intact abdominal wall. Musculoskeletal: No acute or significant osseous findings. IMPRESSION: 1. Further decrease in size of the mass posterior to the left hilum, consistent with response to therapy. No recurrent mediastinal or upper abdominal lymphadenopathy. 2. Evolving radiation changes in the left perihilar region. Left pleural effusion has minimally enlarged, without nodularity. 3. Mild distal colonic diverticulosis. 4. Aortic Atherosclerosis (ICD10-I70.0) and Emphysema (ICD10-J43.9). Electronically Signed   By: Richardean Sale M.D.   On: 10/16/2021 13:42   CT Abdomen Pelvis W Contrast  Result Date: 10/16/2021 CLINICAL DATA:  Restaging non-small cell lung cancer. Cough with weight loss. No abdominal complaints. EXAM: CT CHEST, ABDOMEN, AND PELVIS WITH CONTRAST TECHNIQUE: Multidetector CT imaging of the chest, abdomen and pelvis was performed following the standard protocol during bolus administration of intravenous contrast. CONTRAST:  60mL OMNIPAQUE IOHEXOL 350 MG/ML SOLN COMPARISON:  Prior CTs 08/15/2021 and PET-CT 05/02/2021. FINDINGS: CT CHEST FINDINGS Cardiovascular: No acute vascular findings are seen. There is mild aortic atherosclerosis. Right IJ Port-A-Cath extends to the mid SVC. The heart size is normal. A small amount of fluid within the superior pericardial recess appears unchanged. Mediastinum/Nodes: Further improvement in previously demonstrated lymphadenopathy. AP window node measures approximately 11 mm short axis (previously 12 mm). Posterior left hilar mass measures 1.9 x 1.7 cm on image 28/2 (previously 2.4 x 2.1 cm). No progressive mediastinal, hilar or axillary adenopathy. The thyroid gland, trachea and esophagus demonstrate no significant findings. Lungs/Pleura: Minimal enlargement of a small left pleural effusion without pleural based  nodularity. Mild centrilobular and paraseptal emphysema. As above, interval improvement in the left retrohilar mass. No new or enlarging pulmonary nodules. 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 Musculoskeletal/Chest wall: No chest wall mass or suspicious osseous findings. CT ABDOMEN AND PELVIS FINDINGS Hepatobiliary: The liver is normal in density without suspicious focal abnormality. No evidence of gallstones, gallbladder wall thickening or biliary dilatation. Pancreas: Unremarkable. No pancreatic ductal dilatation or surrounding inflammatory changes. Spleen: Normal in size without focal abnormality. Adrenals/Urinary Tract: Both adrenal glands appear normal. Stable cyst in the upper pole of the left kidney. No evidence of enhancing renal mass, urinary tract calculus or hydronephrosis. The bladder appears unremarkable for its degree of distention. Stomach/Bowel: Enteric contrast was administered and has passed into the distal colon. The stomach appears unremarkable for its degree of distension. No evidence of bowel wall thickening, distention or surrounding inflammatory change. The appendix appears normal. Stable mild diverticular changes within the sigmoid colon. Vascular/Lymphatic: There are no enlarged abdominal or pelvic lymph nodes. Previously demonstrated retroperitoneal lymphadenopathy has resolved. No significant vascular findings. There is minimal aortoiliac atherosclerosis without aneurysm. The portal, superior mesenteric and splenic veins are patent. Reproductive: The prostate gland and seminal vesicles appear unremarkable. Other: No ascites or peritoneal nodularity.  Intact abdominal wall. Musculoskeletal: No acute or significant osseous findings. IMPRESSION: 1. Further decrease in size of the mass posterior to the left hilum, consistent with response to therapy. No recurrent mediastinal or upper abdominal  lymphadenopathy. 2. Evolving radiation changes in the left perihilar region. Left pleural  effusion has minimally enlarged, without nodularity. 3. Mild distal colonic diverticulosis. 4. Aortic Atherosclerosis (ICD10-I70.0) and Emphysema (ICD10-J43.9). Electronically Signed   By: Richardean Sale M.D.   On: 10/16/2021 13:42     ASSESSMENT/PLAN:  This is a very pleasant 45 year old Caucasian male diagnosed with stage IV (T2b, N2, M1 B) non-small cell lung cancer, adenocarcinoma.  He presented with a large central left lower lobe lung mass in addition to left hilar mediastinal lymphadenopathy.  He also has retroperitoneal abdominal lymphadenopathy.  He was diagnosed in May 2022.  His PD-L1 expression is 50%.  His molecular studies by [facility] did not show any evidence of any actionable mutations.  He underwent palliative radiotherapy to the large left lower lobe lung mass under the care of Dr. Lisbeth Renshaw.  He completed this on 06/12/2021.    The patient is currently undergoing systemic chemotherapy with carboplatin for an AUC of 5, Alimta 500 mg per metered squared, and Keytruda 200 mg IV every 3 weeks.  He is status post 6 cycles.  Starting from cycle #5, the patient has been on maintenance Alimta and Keytruda.  The patient recently had a restaging CT scan performed. Dr. Julien Nordmann personally and independently reviewed the scan and discussed the results with the patient today. The scan showed no evidence for disease progression. Dr. Julien Nordmann and I discussed that the emphysema noted on the scan is not new and likely from smoking related lung changes. Regarding the pleural effusion, this is small and likely not amenable to drainage and we generally recommend close monitoring and drainage in the future if needed. The patient has mild dyspnea on exertion such as walking up hills which he did not have prior to his lung cancer diagnosis. His mild dyspnea on exertion is likely multifactorial with his large lung cancer, radiation  scaring, COPD, anemia, and recent covid-19 infection.    The patient was seen with Dr. Julien Nordmann today. Labs were reviewed. Recommend that he proceed with cycle #7 today as scheduled.   We will see him back for a follow up visit in 3 weeks for evaluation before starting cycle #8.   The patient was advised to call immediately if he has any concerning symptoms in the interval. The patient voices understanding of current disease status and treatment options and is in agreement with the current care plan. All questions were answered. The patient knows to call the clinic with any problems, questions or concerns. We can certainly see the patient much sooner if necessary  No orders of the defined types were placed in this encounter.     Jenia Klepper L Trea Carnegie, PA-C 10/19/21  ADDENDUM: Hematology/Oncology Attending: I had a face-to-face encounter with the patient today.  I reviewed his record, lab, scan and recommended his care plan.  This is a very pleasant 45 years old white male with a stage IV non-small cell lung cancer, adenocarcinoma presented with large central left lower lobe lung mass in addition to left hilar and mediastinal lymphadenopathy as well as retroperitoneal abdominal lymphadenopathy diagnosed in May 2022 with no actionable mutations and PD-L1 expression of 50%. The patient underwent palliative radiotherapy to the large left lower lobe lung mass and he is currently undergoing systemic chemotherapy initially with carboplatin, Alimta and Keytruda for 4 cycles and currently on maintenance treatment with Alimta and Keytruda every 3 weeks status post 2 more cycles.  The patient has been tolerating this treatment fairly well with no concerning adverse effect except for mild fatigue. He had repeat CT scan of  the chest, abdomen pelvis performed recently.  I personally and independently reviewed the scans and discussed the results with the patient today. Has a scan showed no concerning  findings for disease progression or metastasis.  He has development of a small pleural effusion likely related to his disease but not large enough for any consideration of drainage. I recommended for the patient to continue his current treatment with maintenance Alimta and Keytruda and he will proceed with cycle #7 today. The patient will come back for follow-up visit in 3 weeks for evaluation before the next cycle of his treatment. He was advised to call immediately if he has any other concerning symptoms in the interval. The total time spent in the appointment was 35 minutes. Disclaimer: This note was dictated with voice recognition software. Similar sounding words can inadvertently be transcribed and may be missed upon review. Eilleen Kempf, MD 10/19/21

## 2021-10-16 ENCOUNTER — Ambulatory Visit (HOSPITAL_COMMUNITY)
Admission: RE | Admit: 2021-10-16 | Discharge: 2021-10-16 | Disposition: A | Discharge status: Home / Self Care | Payer: Managed Care, Other (non HMO) | Source: Ambulatory Visit | Attending: Internal Medicine | Admitting: Internal Medicine

## 2021-10-16 ENCOUNTER — Telehealth: Payer: Self-pay | Admitting: *Deleted

## 2021-10-16 ENCOUNTER — Other Ambulatory Visit: Payer: Self-pay

## 2021-10-16 DIAGNOSIS — C3490 Malignant neoplasm of unspecified part of unspecified bronchus or lung: Secondary | ICD-10-CM | POA: Diagnosis not present

## 2021-10-16 MED ORDER — HEPARIN SOD (PORK) LOCK FLUSH 100 UNIT/ML IV SOLN
INTRAVENOUS | Status: AC
  Filled 2021-10-16: qty 5

## 2021-10-16 MED ORDER — HEPARIN SOD (PORK) LOCK FLUSH 100 UNIT/ML IV SOLN
500.0000 [IU] | Freq: Once | INTRAVENOUS | Status: AC
  Administered 2021-10-16: 500 [IU] via INTRAVENOUS

## 2021-10-16 MED ORDER — IOHEXOL 350 MG/ML SOLN
80.0000 mL | Freq: Once | INTRAVENOUS | Status: AC | PRN
  Administered 2021-10-16: 80 mL via INTRAVENOUS

## 2021-10-16 NOTE — Telephone Encounter (Signed)
"  [employer] will fax a new form for extension of my leave.  Twelve month estimate is too long for HR Department to extend.  Three to six month maximum is best estimate to likely be approved to maintain insurance and benefits.  What is best fax number to provide when I call employer tomorrow?"     Provided main office fax number [phone].

## 2021-10-19 ENCOUNTER — Inpatient Hospital Stay (HOSPITAL_BASED_OUTPATIENT_CLINIC_OR_DEPARTMENT_OTHER): Payer: Managed Care, Other (non HMO) | Admitting: Physician Assistant

## 2021-10-19 ENCOUNTER — Other Ambulatory Visit: Payer: Self-pay

## 2021-10-19 ENCOUNTER — Inpatient Hospital Stay: Payer: Managed Care, Other (non HMO) | Attending: Physician Assistant

## 2021-10-19 ENCOUNTER — Inpatient Hospital Stay: Payer: Managed Care, Other (non HMO)

## 2021-10-19 VITALS — BP 132/85 | HR 81 | Temp 97.9°F | Resp 17 | Wt 169.8 lb

## 2021-10-19 DIAGNOSIS — C3432 Malignant neoplasm of lower lobe, left bronchus or lung: Secondary | ICD-10-CM | POA: Insufficient documentation

## 2021-10-19 DIAGNOSIS — Z5112 Encounter for antineoplastic immunotherapy: Secondary | ICD-10-CM

## 2021-10-19 DIAGNOSIS — Z79899 Other long term (current) drug therapy: Secondary | ICD-10-CM | POA: Insufficient documentation

## 2021-10-19 DIAGNOSIS — Z5111 Encounter for antineoplastic chemotherapy: Secondary | ICD-10-CM | POA: Insufficient documentation

## 2021-10-19 DIAGNOSIS — D649 Anemia, unspecified: Secondary | ICD-10-CM

## 2021-10-19 LAB — CMP (CANCER CENTER ONLY)
ALT: 26 U/L (ref 0–44)
AST: 25 U/L (ref 15–41)
Albumin: 3.3 g/dL — ABNORMAL LOW (ref 3.5–5.0)
Alkaline Phosphatase: 93 U/L (ref 38–126)
Anion gap: 9 (ref 5–15)
BUN: 10 mg/dL (ref 6–20)
CO2: 26 mmol/L (ref 22–32)
Calcium: 9.1 mg/dL (ref 8.9–10.3)
Chloride: 106 mmol/L (ref 98–111)
Creatinine: 0.99 mg/dL (ref 0.61–1.24)
GFR, Estimated: >60 mL/min (ref >60)
Glucose, Bld: 97 mg/dL (ref 70–99)
Potassium: 4 mmol/L (ref 3.5–5.1)
Sodium: 141 mmol/L (ref 135–145)
Total Bilirubin: <0.2 mg/dL — ABNORMAL LOW (ref 0.3–1.2)
Total Protein: 7.2 g/dL (ref 6.5–8.1)

## 2021-10-19 LAB — CBC WITH DIFFERENTIAL (CANCER CENTER ONLY)
Abs Immature Granulocytes: 0.03 10*3/uL (ref 0.00–0.07)
Basophils Absolute: 0.1 10*3/uL (ref 0.0–0.1)
Basophils Relative: 1 %
Eosinophils Absolute: 0.1 10*3/uL (ref 0.0–0.5)
Eosinophils Relative: 2 %
HCT: 30.2 % — ABNORMAL LOW (ref 39.0–52.0)
Hemoglobin: 9.7 g/dL — ABNORMAL LOW (ref 13.0–17.0)
Immature Granulocytes: 1 %
Lymphocytes Relative: 15 %
Lymphs Abs: 1 10*3/uL (ref 0.7–4.0)
MCH: 32.9 pg (ref 26.0–34.0)
MCHC: 32.1 g/dL (ref 30.0–36.0)
MCV: 102.4 fL — ABNORMAL HIGH (ref 80.0–100.0)
Monocytes Absolute: 0.9 10*3/uL (ref 0.1–1.0)
Monocytes Relative: 14 %
Neutro Abs: 4.2 10*3/uL (ref 1.7–7.7)
Neutrophils Relative %: 67 %
Platelet Count: 320 10*3/uL (ref 150–400)
RBC: 2.95 MIL/uL — ABNORMAL LOW (ref 4.22–5.81)
RDW: 13.2 % (ref 11.5–15.5)
WBC Count: 6.3 10*3/uL (ref 4.0–10.5)
nRBC: 0 % (ref 0.0–0.2)

## 2021-10-19 LAB — TSH: TSH: 0.223 u[IU]/mL — ABNORMAL LOW (ref 0.320–4.118)

## 2021-10-19 LAB — SAMPLE TO BLOOD BANK

## 2021-10-19 MED ORDER — PROCHLORPERAZINE MALEATE 10 MG PO TABS
10.0000 mg | ORAL_TABLET | Freq: Once | ORAL | Status: AC
  Administered 2021-10-19: 12:00:00 10 mg via ORAL
  Filled 2021-10-19: qty 1

## 2021-10-19 MED ORDER — SODIUM CHLORIDE 0.9 % IV SOLN: Freq: Once | INTRAVENOUS | Status: AC

## 2021-10-19 MED ORDER — SODIUM CHLORIDE 0.9 % IV SOLN
200.0000 mg | Freq: Once | INTRAVENOUS | Status: AC
  Administered 2021-10-19: 12:00:00 200 mg via INTRAVENOUS
  Filled 2021-10-19: qty 8

## 2021-10-19 MED ORDER — HEPARIN SOD (PORK) LOCK FLUSH 100 UNIT/ML IV SOLN
500.0000 [IU] | Freq: Once | INTRAVENOUS | Status: AC | PRN
  Administered 2021-10-19: 13:00:00 500 [IU]

## 2021-10-19 MED ORDER — SODIUM CHLORIDE 0.9% FLUSH
10.0000 mL | INTRAVENOUS | Status: DC | PRN
  Administered 2021-10-19: 13:00:00 10 mL

## 2021-10-19 MED ORDER — SODIUM CHLORIDE 0.9 % IV SOLN
500.0000 mg/m2 | Freq: Once | INTRAVENOUS | Status: AC
  Administered 2021-10-19: 13:00:00 1000 mg via INTRAVENOUS
  Filled 2021-10-19: qty 40

## 2021-10-19 NOTE — Patient Instructions (Signed)
[facility]  Discharge Instructions: Thank you for choosing [facility] to provide your oncology and hematology care.   If you have a lab appointment with the Cancer Center, please go directly to the Cancer Center and check in at the registration area.   Wear comfortable clothing and clothing appropriate for easy access to any Portacath or PICC line.   We strive to give you quality time with your provider. You may need to reschedule your appointment if you arrive late (15 or more minutes).  Arriving late affects you and other patients whose appointments are after yours.  Also, if you miss three or more appointments without notifying the office, you may be dismissed from the clinic at the provider's discretion.      For prescription refill requests, have your pharmacy contact our office and allow 72 hours for refills to be completed.    Today you received the following chemotherapy and/or immunotherapy agents: Keytruda/Alimta.      To help prevent nausea and vomiting after your treatment, we encourage you to take your nausea medication as directed.  BELOW ARE SYMPTOMS THAT SHOULD BE REPORTED IMMEDIATELY: *FEVER GREATER THAN 100.4 F (38 C) OR HIGHER *CHILLS OR SWEATING *NAUSEA AND VOMITING THAT IS NOT CONTROLLED WITH YOUR NAUSEA MEDICATION *UNUSUAL SHORTNESS OF BREATH *UNUSUAL BRUISING OR BLEEDING *URINARY PROBLEMS (pain or burning when urinating, or frequent urination) *BOWEL PROBLEMS (unusual diarrhea, constipation, pain near the anus) TENDERNESS IN MOUTH AND THROAT WITH OR WITHOUT PRESENCE OF ULCERS (sore throat, sores in mouth, or a toothache) UNUSUAL RASH, SWELLING OR PAIN  UNUSUAL VAGINAL DISCHARGE OR ITCHING   Items with * indicate a potential emergency and should be followed up as soon as possible or go to the Emergency Department if any problems should occur.  Please show the CHEMOTHERAPY ALERT CARD or IMMUNOTHERAPY ALERT CARD at  check-in to the Emergency Department and triage nurse.  Should you have questions after your visit or need to cancel or reschedule your appointment, please contact [facility]  Dept: [phone]  and follow the prompts.  Office hours are 8:00 a.m. to 4:30 p.m. Monday - Friday. Please note that voicemails left after 4:00 p.m. may not be returned until the following business day.  We are closed weekends and major holidays. You have access to a nurse at all times for urgent questions. Please call the main number to the clinic Dept: [phone] and follow the prompts.   For any non-urgent questions, you may also contact your provider using MyChart. We now offer e-Visits for anyone 18 and older to request care online for non-urgent symptoms. For details visit [email or web address].   Also download the MyChart app! Go to the app store, search "MyChart", open the app, select [facility], and log in with your MyChart username and password.  Due to Covid, a mask is required upon entering the hospital/clinic. If you do not have a mask, one will be given to you upon arrival. For doctor visits, patients may have 1 support person aged 18 or older with them. For treatment visits, patients cannot have anyone with them due to current Covid guidelines and our immunocompromised population.   

## 2021-10-23 ENCOUNTER — Telehealth: Payer: Self-pay

## 2021-10-23 DIAGNOSIS — C3432 Malignant neoplasm of lower lobe, left bronchus or lung: Secondary | ICD-10-CM

## 2021-10-23 MED ORDER — PROCHLORPERAZINE MALEATE 10 MG PO TABS: 10.0000 mg | ORAL_TABLET | Freq: Four times a day (QID) | ORAL | 0 refills | Status: DC | PRN

## 2021-10-23 NOTE — Telephone Encounter (Signed)
Pt called to request a refill of Compazine.

## 2021-11-02 ENCOUNTER — Telehealth: Payer: Self-pay | Admitting: *Deleted

## 2021-11-02 NOTE — Telephone Encounter (Signed)
Completed revised employer benefits ADA form faxed and e-mailed.  [place] notified.  Chose to have originals mailed to home.   Envelope to [facility] mail pick-up bin.

## 2021-11-09 ENCOUNTER — Inpatient Hospital Stay: Payer: Managed Care, Other (non HMO) | Attending: Physician Assistant

## 2021-11-09 ENCOUNTER — Encounter: Payer: Self-pay | Admitting: Internal Medicine

## 2021-11-09 ENCOUNTER — Inpatient Hospital Stay (HOSPITAL_BASED_OUTPATIENT_CLINIC_OR_DEPARTMENT_OTHER): Payer: Managed Care, Other (non HMO) | Admitting: Internal Medicine

## 2021-11-09 ENCOUNTER — Inpatient Hospital Stay: Payer: Managed Care, Other (non HMO)

## 2021-11-09 ENCOUNTER — Other Ambulatory Visit: Payer: Self-pay

## 2021-11-09 VITALS — BP 116/70 | HR 84 | Temp 97.7°F | Resp 19 | Ht 69.0 in | Wt 168.9 lb

## 2021-11-09 DIAGNOSIS — Z5111 Encounter for antineoplastic chemotherapy: Secondary | ICD-10-CM | POA: Diagnosis present

## 2021-11-09 DIAGNOSIS — Z5112 Encounter for antineoplastic immunotherapy: Secondary | ICD-10-CM

## 2021-11-09 DIAGNOSIS — Z79899 Other long term (current) drug therapy: Secondary | ICD-10-CM | POA: Insufficient documentation

## 2021-11-09 DIAGNOSIS — C3432 Malignant neoplasm of lower lobe, left bronchus or lung: Secondary | ICD-10-CM

## 2021-11-09 DIAGNOSIS — Z95828 Presence of other vascular implants and grafts: Secondary | ICD-10-CM

## 2021-11-09 LAB — CBC WITH DIFFERENTIAL (CANCER CENTER ONLY)
Abs Immature Granulocytes: 0.02 10*3/uL (ref 0.00–0.07)
Basophils Absolute: 0.1 10*3/uL (ref 0.0–0.1)
Basophils Relative: 1 %
Eosinophils Absolute: 0.1 10*3/uL (ref 0.0–0.5)
Eosinophils Relative: 2 %
HCT: 30.7 % — ABNORMAL LOW (ref 39.0–52.0)
Hemoglobin: 10.2 g/dL — ABNORMAL LOW (ref 13.0–17.0)
Immature Granulocytes: 0 %
Lymphocytes Relative: 13 %
Lymphs Abs: 0.8 10*3/uL (ref 0.7–4.0)
MCH: 32.8 pg (ref 26.0–34.0)
MCHC: 33.2 g/dL (ref 30.0–36.0)
MCV: 98.7 fL (ref 80.0–100.0)
Monocytes Absolute: 0.9 10*3/uL (ref 0.1–1.0)
Monocytes Relative: 15 %
Neutro Abs: 4.2 10*3/uL (ref 1.7–7.7)
Neutrophils Relative %: 69 %
Platelet Count: 333 10*3/uL (ref 150–400)
RBC: 3.11 MIL/uL — ABNORMAL LOW (ref 4.22–5.81)
RDW: 12.9 % (ref 11.5–15.5)
WBC Count: 6.1 10*3/uL (ref 4.0–10.5)
nRBC: 0 % (ref 0.0–0.2)

## 2021-11-09 LAB — CMP (CANCER CENTER ONLY)
ALT: 24 U/L (ref 0–44)
AST: 21 U/L (ref 15–41)
Albumin: 3.1 g/dL — ABNORMAL LOW (ref 3.5–5.0)
Alkaline Phosphatase: 101 U/L (ref 38–126)
Anion gap: 10 (ref 5–15)
BUN: 11 mg/dL (ref 6–20)
CO2: 25 mmol/L (ref 22–32)
Calcium: 9.1 mg/dL (ref 8.9–10.3)
Chloride: 106 mmol/L (ref 98–111)
Creatinine: 1.06 mg/dL (ref 0.61–1.24)
GFR, Estimated: >60 mL/min (ref >60)
Glucose, Bld: 92 mg/dL (ref 70–99)
Potassium: 4.1 mmol/L (ref 3.5–5.1)
Sodium: 141 mmol/L (ref 135–145)
Total Bilirubin: 0.3 mg/dL (ref 0.3–1.2)
Total Protein: 7.4 g/dL (ref 6.5–8.1)

## 2021-11-09 LAB — TSH: TSH: 1.07 u[IU]/mL (ref 0.320–4.118)

## 2021-11-09 MED ORDER — SODIUM CHLORIDE 0.9 % IV SOLN
200.0000 mg | Freq: Once | INTRAVENOUS | Status: AC
  Administered 2021-11-09: 200 mg via INTRAVENOUS
  Filled 2021-11-09: qty 8

## 2021-11-09 MED ORDER — PROCHLORPERAZINE MALEATE 10 MG PO TABS
10.0000 mg | ORAL_TABLET | Freq: Once | ORAL | Status: AC
  Administered 2021-11-09: 10 mg via ORAL
  Filled 2021-11-09: qty 1

## 2021-11-09 MED ORDER — SODIUM CHLORIDE 0.9% FLUSH
10.0000 mL | Freq: Once | INTRAVENOUS | Status: AC
  Administered 2021-11-09: 10 mL

## 2021-11-09 MED ORDER — SODIUM CHLORIDE 0.9 % IV SOLN: Freq: Once | INTRAVENOUS | Status: AC

## 2021-11-09 MED ORDER — SODIUM CHLORIDE 0.9% FLUSH
10.0000 mL | INTRAVENOUS | Status: DC | PRN
  Administered 2021-11-09: 10 mL

## 2021-11-09 MED ORDER — HEPARIN SOD (PORK) LOCK FLUSH 100 UNIT/ML IV SOLN
500.0000 [IU] | Freq: Once | INTRAVENOUS | Status: AC | PRN
  Administered 2021-11-09: 500 [IU]

## 2021-11-09 MED ORDER — SODIUM CHLORIDE 0.9 % IV SOLN
500.0000 mg/m2 | Freq: Once | INTRAVENOUS | Status: AC
  Administered 2021-11-09: 1000 mg via INTRAVENOUS
  Filled 2021-11-09: qty 40

## 2021-11-09 NOTE — Progress Notes (Signed)
[facility] [phone]   [phone]  OFFICE PROGRESS NOTE  [name], MD [place] [address]  DIAGNOSIS:  Stage IV (T2b, N2, M1 B) non-small cell lung cancer, adenocarcinoma presented with large central left lower lobe lung mass with left hilar and mediastinal lymphadenopathy as well as abdominal retroperitoneal lymphadenopathy diagnosed in May 2022.  The patient also has bilateral parotid gland nodules that need close monitoring.  [facility] MOLECULAR STUDY: Results with Therapy Associations BIOMARKER METHOD ANALYTE RESULT THERAPY ASSOCIATION BIOMARKER LEVEL* .PD-L1 (22c3) IHC Protein Positive, TPS: 50% BENEFIT cemiplimab, pembrolizumab Level 1 .PD-L1 (28-8) IHC Protein Positive  1+, 50% BENEFIT nivolumab/ipilimumab combination Level 1 .TMB Seq DNA-Tumor High, 18 mut/Mb BENEFIT pembrolizumab Level 2 . alectinib, ceritinib, crizotinib, lorlatinib Level 1 . IHC Protein Negative  0 brigatinib Level 2 . ALK Seq RNA-Tumor Fusion Not Detected LACK OF BENEFIT alectinib, brigatinib, ceritinib, crizotinib, lorlatinib Level 2 .BRAF Seq DNA-Tumor Mutation Not Detected LACK OF BENEFIT dabrafenib and trametinib combination therapy, vemurafenib Level 2 .EGFR Seq DNA-Tumor Mutation Not Detected LACK OF BENEFIT erlotinib, gefitinib Level 2 .KRAS Seq DNA-Tumor Mutation Not Detected LACK OF BENEFIT sotorasib Level 2 .RET Seq RNA-Tumor Fusion Not Detected LACK OF BENEFIT pralsetinib, selpercatinib Level 2 .ROS1 Seq RNA-Tumor Fusion Not Detected LACK OF BENEFIT ceritinib, crizotinib, entrectinib, lorlatinib Level 2 . CNA-Seq DNA-Tumor Amplification Not Detected .MET Seq RNA-Tumor Variant Transcript Not Detected LACK OF BENEFIT crizotinib Level 3  PRIOR THERAPY: Palliative radiotherapy to the large central left lower lobe lung mass under the care of Dr. Lisbeth Renshaw.  CURRENT THERAPY: Systemic chemotherapy with carboplatin for AUC  of 5, Alimta 500 Mg/M2 and possibly Keytruda 200 Mg IV every 3 weeks.  First dose expected June 14, 2021.  Status post 7 cycles of treatment.  INTERVAL HISTORY: Cameron Harper 45 y.o. male returns to the clinic today for follow-up visit.  The patient is feeling fine today with no concerning complaints except for few days of fatigue after the treatment.  He denied having any chest pain, shortness of breath, cough or hemoptysis.  He denied having any nausea, vomiting, diarrhea or constipation.  He has no headache or visual changes.  He denied having any significant weight loss or night sweats.  He has no fever or chills.  He is here today for evaluation before starting cycle #8 of his treatment.  MEDICAL HISTORY: Past Medical History:  Diagnosis Date   Chronic back pain    Eczema    GERD (gastroesophageal reflux disease)    Lung cancer (HCC)     ALLERGIES:  is allergic to penicillins.  MEDICATIONS:  Current Outpatient Medications  Medication Sig Dispense Refill   folic acid (FOLVITE) 1 MG tablet Take 1 tablet (1 mg total) by mouth daily. 30 tablet 4   lidocaine-prilocaine (EMLA) cream Apply 1 application topically as needed. 30 g 2   prochlorperazine (COMPAZINE) 10 MG tablet Take 1 tablet (10 mg total) by mouth every 6 (six) hours as needed for nausea or vomiting. 30 tablet 0   acetaminophen (TYLENOL) 500 MG tablet Take 500-1,000 mg by mouth every 6 (six) hours as needed (for back pain.). (Patient not taking: Reported on 09/27/2021)     magic mouthwash SOLN Take 5 mLs by mouth 3 (three) times daily as needed for mouth pain. (Patient not taking: Reported on 09/27/2021) 240 mL 0   traMADol (ULTRAM) 50 MG tablet Take 50 mg by mouth every 6 (six) hours as needed  for severe pain. (Patient not taking: Reported on 09/27/2021)     zolpidem (AMBIEN) 10 MG tablet Take 5-10 mg by mouth at bedtime as needed for sleep. (Patient not taking: Reported on 09/27/2021)     No current facility-administered  medications for this visit.    SURGICAL HISTORY:  Past Surgical History:  Procedure Laterality Date   BRONCHIAL BRUSHINGS  05/12/2021   Procedure: BRONCHIAL BRUSHINGS;  Surgeon: Garner Nash, DO;  Location: [facility];  Service: Pulmonary;;   BRONCHIAL DILITATION  05/12/2021   Procedure: BRONCHIAL DILITATION;  Surgeon: Garner Nash, DO;  Location: [facility];  Service: Pulmonary;;   BRONCHIAL NEEDLE ASPIRATION BIOPSY  05/12/2021   Procedure: BRONCHIAL NEEDLE ASPIRATION BIOPSIES;  Surgeon: Garner Nash, DO;  Location: [facility];  Service: Pulmonary;;   BRONCHIAL WASHINGS  05/12/2021   Procedure: BRONCHIAL WASHINGS;  Surgeon: Garner Nash, DO;  Location: [facility];  Service: Pulmonary;;   CRYOTHERAPY  05/12/2021   Procedure: CRYOTHERAPY;  Surgeon: Garner Nash, DO;  Location: [facility];  Service: Pulmonary;;   HEMOSTASIS CONTROL  05/12/2021   Procedure: HEMOSTASIS CONTROL;  Surgeon: Garner Nash, DO;  Location: [facility];  Service: Pulmonary;;  epi injection   IR IMAGING GUIDED PORT INSERTION  06/29/2021   Spine injection     Pain control   VIDEO BRONCHOSCOPY WITH ENDOBRONCHIAL ULTRASOUND N/A 05/12/2021   Procedure: VIDEO BRONCHOSCOPY WITH ENDOBRONCHIAL ULTRASOUND;  Surgeon: Garner Nash, DO;  Location: [facility];  Service: Pulmonary;  Laterality: N/A;   WISDOM TOOTH EXTRACTION     no anesthesia involed    REVIEW OF SYSTEMS:  A comprehensive review of systems was negative.   PHYSICAL EXAMINATION: General appearance: alert, cooperative, and no distress Head: Normocephalic, without obvious abnormality, atraumatic Neck: no adenopathy, no JVD, supple, symmetrical, trachea midline, and thyroid not enlarged, symmetric, no tenderness/mass/nodules Lymph nodes: Cervical, supraclavicular, and axillary nodes normal. Resp: clear to auscultation bilaterally Back: symmetric, no curvature. ROM normal. No CVA tenderness. Cardio: regular rate and rhythm, S1, S2  normal, no murmur, click, rub or gallop GI: soft, non-tender; bowel sounds normal; no masses,  no organomegaly Extremities: extremities normal, atraumatic, no cyanosis or edema  ECOG PERFORMANCE STATUS: 1 - Symptomatic but completely ambulatory  Blood pressure 116/70, pulse 84, temperature 97.7 F (36.5 C), temperature source Tympanic, resp. rate 19, height $RemoveBe'5\' 9"'wVIReKkOY$  (1.753 m), weight 168 lb 14.4 oz (76.6 kg), SpO2 98 %.  LABORATORY DATA: Lab Results  Component Value Date   WBC 6.1 11/09/2021   HGB 10.2 (L) 11/09/2021   HCT 30.7 (L) 11/09/2021   MCV 98.7 11/09/2021   PLT 333 11/09/2021      Chemistry      Component Value Date/Time   NA 141 11/09/2021 0939   K 4.1 11/09/2021 0939   CL 106 11/09/2021 0939   CO2 25 11/09/2021 0939   BUN 11 11/09/2021 0939   CREATININE 1.06 11/09/2021 0939      Component Value Date/Time   CALCIUM 9.1 11/09/2021 0939   ALKPHOS 101 11/09/2021 0939   AST 21 11/09/2021 0939   ALT 24 11/09/2021 0939   BILITOT 0.3 11/09/2021 0939       RADIOGRAPHIC STUDIES: CT Chest W Contrast  Result Date: 10/16/2021 CLINICAL DATA:  Restaging non-small cell lung cancer. Cough with weight loss. No abdominal complaints. EXAM: CT CHEST, ABDOMEN, AND PELVIS WITH CONTRAST TECHNIQUE: Multidetector CT imaging of the chest, abdomen and pelvis was performed following the standard protocol during bolus administration  of intravenous contrast. CONTRAST:  66mL OMNIPAQUE IOHEXOL 350 MG/ML SOLN COMPARISON:  Prior CTs 08/15/2021 and PET-CT 05/02/2021. FINDINGS: CT CHEST FINDINGS Cardiovascular: No acute vascular findings are seen. There is mild aortic atherosclerosis. Right IJ Port-A-Cath extends to the mid SVC. The heart size is normal. A small amount of fluid within the superior pericardial recess appears unchanged. Mediastinum/Nodes: Further improvement in previously demonstrated lymphadenopathy. AP window node measures approximately 11 mm short axis (previously 12 mm). Posterior  left hilar mass measures 1.9 x 1.7 cm on image 28/2 (previously 2.4 x 2.1 cm). No progressive mediastinal, hilar or axillary adenopathy. The thyroid gland, trachea and esophagus demonstrate no significant findings. Lungs/Pleura: Minimal enlargement of a small left pleural effusion without pleural based nodularity. Mild centrilobular and paraseptal emphysema. As above, interval improvement in the left retrohilar mass. No new or enlarging pulmonary nodules. 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 Musculoskeletal/Chest wall: No chest wall mass or suspicious osseous findings. CT ABDOMEN AND PELVIS FINDINGS Hepatobiliary: The liver is normal in density without suspicious focal abnormality. No evidence of gallstones, gallbladder wall thickening or biliary dilatation. Pancreas: Unremarkable. No pancreatic ductal dilatation or surrounding inflammatory changes. Spleen: Normal in size without focal abnormality. Adrenals/Urinary Tract: Both adrenal glands appear normal. Stable cyst in the upper pole of the left kidney. No evidence of enhancing renal mass, urinary tract calculus or hydronephrosis. The bladder appears unremarkable for its degree of distention. Stomach/Bowel: Enteric contrast was administered and has passed into the distal colon. The stomach appears unremarkable for its degree of distension. No evidence of bowel wall thickening, distention or surrounding inflammatory change. The appendix appears normal. Stable mild diverticular changes within the sigmoid colon. Vascular/Lymphatic: There are no enlarged abdominal or pelvic lymph nodes. Previously demonstrated retroperitoneal lymphadenopathy has resolved. No significant vascular findings. There is minimal aortoiliac atherosclerosis without aneurysm. The portal, superior mesenteric and splenic veins are patent. Reproductive: The prostate gland and seminal vesicles  appear unremarkable. Other: No ascites or peritoneal nodularity.  Intact abdominal wall. Musculoskeletal: No acute or significant osseous findings. IMPRESSION: 1. Further decrease in size of the mass posterior to the left hilum, consistent with response to therapy. No recurrent mediastinal or upper abdominal lymphadenopathy. 2. Evolving radiation changes in the left perihilar region. Left pleural effusion has minimally enlarged, without nodularity. 3. Mild distal colonic diverticulosis. 4. Aortic Atherosclerosis (ICD10-I70.0) and Emphysema (ICD10-J43.9). Electronically Signed   By: Richardean Sale M.D.   On: 10/16/2021 13:42   CT Abdomen Pelvis W Contrast  Result Date: 10/16/2021 CLINICAL DATA:  Restaging non-small cell lung cancer. Cough with weight loss. No abdominal complaints. EXAM: CT CHEST, ABDOMEN, AND PELVIS WITH CONTRAST TECHNIQUE: Multidetector CT imaging of the chest, abdomen and pelvis was performed following the standard protocol during bolus administration of intravenous contrast. CONTRAST:  25mL OMNIPAQUE IOHEXOL 350 MG/ML SOLN COMPARISON:  Prior CTs 08/15/2021 and PET-CT 05/02/2021. FINDINGS: CT CHEST FINDINGS Cardiovascular: No acute vascular findings are seen. There is mild aortic atherosclerosis. Right IJ Port-A-Cath extends to the mid SVC. The heart size is normal. A small amount of fluid within the superior pericardial recess appears unchanged. Mediastinum/Nodes: Further improvement in previously demonstrated lymphadenopathy. AP window node measures approximately 11 mm short axis (previously 12 mm). Posterior left hilar mass measures 1.9 x 1.7 cm on image 28/2 (previously 2.4 x 2.1 cm). No progressive mediastinal, hilar or axillary adenopathy. The thyroid gland, trachea and esophagus demonstrate no significant findings. Lungs/Pleura:  Minimal enlargement of a small left pleural effusion without pleural based nodularity. Mild centrilobular and paraseptal emphysema. As above, interval  improvement in the left retrohilar mass. No new or enlarging pulmonary nodules. 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 Musculoskeletal/Chest wall: No chest wall mass or suspicious osseous findings. CT ABDOMEN AND PELVIS FINDINGS Hepatobiliary: The liver is normal in density without suspicious focal abnormality. No evidence of gallstones, gallbladder wall thickening or biliary dilatation. Pancreas: Unremarkable. No pancreatic ductal dilatation or surrounding inflammatory changes. Spleen: Normal in size without focal abnormality. Adrenals/Urinary Tract: Both adrenal glands appear normal. Stable cyst in the upper pole of the left kidney. No evidence of enhancing renal mass, urinary tract calculus or hydronephrosis. The bladder appears unremarkable for its degree of distention. Stomach/Bowel: Enteric contrast was administered and has passed into the distal colon. The stomach appears unremarkable for its degree of distension. No evidence of bowel wall thickening, distention or surrounding inflammatory change. The appendix appears normal. Stable mild diverticular changes within the sigmoid colon. Vascular/Lymphatic: There are no enlarged abdominal or pelvic lymph nodes. Previously demonstrated retroperitoneal lymphadenopathy has resolved. No significant vascular findings. There is minimal aortoiliac atherosclerosis without aneurysm. The portal, superior mesenteric and splenic veins are patent. Reproductive: The prostate gland and seminal vesicles appear unremarkable. Other: No ascites or peritoneal nodularity.  Intact abdominal wall. Musculoskeletal: No acute or significant osseous findings. IMPRESSION: 1. Further decrease in size of the mass posterior to the left hilum, consistent with response to therapy. No recurrent mediastinal or upper abdominal lymphadenopathy. 2. Evolving radiation changes in the left perihilar  region. Left pleural effusion has minimally enlarged, without nodularity. 3. Mild distal colonic diverticulosis. 4. Aortic Atherosclerosis (ICD10-I70.0) and Emphysema (ICD10-J43.9). Electronically Signed   By: Richardean Sale M.D.   On: 10/16/2021 13:42    ASSESSMENT AND PLAN: This is a very pleasant 45 years old white male recently diagnosed with a stage IV (T2b, N2, M1 B) non-small cell lung cancer, adenocarcinoma presented with large central left lower lobe lung mass in addition to left hilar and mediastinal lymphadenopathy as well as retroperitoneal abdominal lymph node diagnosed in May 2022.  The patient is currently undergoing palliative radiotherapy to the large left lower lobe lung mass under the care of of Dr. Lisbeth Renshaw.  He is expected to complete this course of treatment on June 12, 2021. MRI of the brain showed no evidence of metastatic disease to the brain. His molecular studies by [facility] showed no actionable mutations and PD-L1 expression of 50%. He is currently undergoing systemic chemotherapy with carboplatin for AUC of 5, Alimta 500 Mg/M2 and Keytruda 200 Mg IV every 3 weeks.  Starting from cycle #5 he is on maintenance treatment with Alimta and Keytruda every 3 weeks status post 7 cycles. The patient continues to tolerate his treatment with maintenance Alimta and Keytruda fairly well. I recommended for him to proceed with cycle #8 today as planned. He will come back for follow-up visit in 3 weeks for evaluation before the next cycle of his treatment. The patient was advised to call immediately if he has any concerning symptoms in the interval. The patient voices understanding of current disease status and treatment options and is in agreement with the current care plan. All questions were answered. The patient knows to call the clinic with any problems, questions or concerns. We can certainly see the patient much sooner if necessary.  Disclaimer: This note was dictated  with voice  recognition software. Similar sounding words can inadvertently be transcribed and may not be corrected upon review.

## 2021-11-09 NOTE — Patient Instructions (Signed)
[facility]ICAL [facility]COL[facility]  Discharge Instructions: Thank you for choosing [facility] to provide your oncology and hematology care.   If you have a lab appointment with the Cancer Center, please go directly to the Cancer Center and check in at the registration area.   Wear comfortable clothing and clothing appropriate for easy access to any Portacath or PICC line.   We strive to give you quality time with your provider. You may need to reschedule your appointment if you arrive late (15 or more minutes).  Arriving late affects you and other patients whose appointments are after yours.  Also, if you miss three or more appointments without notifying the office, you may be dismissed from the clinic at the provider's discretion.      For prescription refill requests, have your pharmacy contact our office and allow 72 hours for refills to be completed.    Today you received the following chemotherapy and/or immunotherapy agents: Keytruda/Alimta.      To help prevent nausea and vomiting after your treatment, we encourage you to take your nausea medication as directed.  BELOW ARE SYMPTOMS THAT SHOULD BE REPORTED IMMEDIATELY: *FEVER GREATER THAN 100.4 F (38 C) OR HIGHER *CHILLS OR SWEATING *NAUSEA AND VOMITING THAT IS NOT CONTROLLED WITH YOUR NAUSEA MEDICATION *UNUSUAL SHORTNESS OF BREATH *UNUSUAL BRUISING OR BLEEDING *URINARY PROBLEMS (pain or burning when urinating, or frequent urination) *BOWEL PROBLEMS (unusual diarrhea, constipation, pain near the anus) TENDERNESS IN MOUTH AND THROAT WITH OR WITHOUT PRESENCE OF ULCERS (sore throat, sores in mouth, or a toothache) UNUSUAL RASH, SWELLING OR PAIN  UNUSUAL VAGINAL DISCHARGE OR ITCHING   Items with * indicate a potential emergency and should be followed up as soon as possible or go to the Emergency Department if any problems should occur.  Please show the CHEMOTHERAPY ALERT CARD or IMMUNOTHERAPY ALERT CARD at  check-in to the Emergency Department and triage nurse.  Should you have questions after your visit or need to cancel or reschedule your appointment, please contact [facility]  Dept: [phone]  and follow the prompts.  Office hours are 8:00 a.m. to 4:30 p.m. Monday - Friday. Please note that voicemails left after 4:00 p.m. may not be returned until the following business day.  We are closed weekends and major holidays. You have access to a nurse at all times for urgent questions. Please call the main number to the clinic Dept: [phone] and follow the prompts.   For any non-urgent questions, you may also contact your provider using MyChart. We now offer e-Visits for anyone 18 and older to request care online for non-urgent symptoms. For details visit [email or web address].   Also download the MyChart app! Go to the app store, search "MyChart", open the app, select [facility], and log in with your MyChart username and password.  Due to Covid, a mask is required upon entering the hospital/clinic. If you do not have a mask, one will be given to you upon arrival. For doctor visits, patients may have 1 support person aged 18 or older with them. For treatment visits, patients cannot have anyone with them due to current Covid guidelines and our immunocompromised population.   

## 2021-11-14 ENCOUNTER — Telehealth: Payer: Self-pay | Admitting: Medical Oncology

## 2021-11-14 NOTE — Telephone Encounter (Signed)
Groin area -  Discolored spots  2 quarter sized circle shaped discolored skin area. One lesion is tear dropped shaped. They are bluish /purple and dry ."It looks like bruises ". He has excema, that is usually looks dry and red. He has never had it in the groin area. No pain, itching. Denies insect bite.   I instructed pt to apply Eucerin cream and will ask Julien Nordmann to advise.

## 2021-11-15 ENCOUNTER — Other Ambulatory Visit: Payer: Self-pay | Admitting: Medical Oncology

## 2021-11-15 DIAGNOSIS — C3432 Malignant neoplasm of lower lobe, left bronchus or lung: Secondary | ICD-10-CM

## 2021-11-15 MED ORDER — FOLIC ACID 1 MG PO TABS: 1.0000 mg | ORAL_TABLET | Freq: Every day | ORAL | 4 refills | Status: DC

## 2021-11-19 ENCOUNTER — Other Ambulatory Visit: Payer: Self-pay

## 2021-11-22 NOTE — Progress Notes (Incomplete)
[facility] OFFICE PROGRESS NOTE  [name], MD [place] [address]  DIAGNOSIS: ***  PRIOR THERAPY:  CURRENT THERAPY:  INTERVAL HISTORY: Cameron Harper 45 y.o. male returns for *** regular *** visit for followup of ***   MEDICAL HISTORY: Past Medical History:  Diagnosis Date   Chronic back pain    Eczema    GERD (gastroesophageal reflux disease)    Lung cancer ([facility])     ALLERGIES:  is allergic to penicillins.  MEDICATIONS:  Current Outpatient Medications  Medication Sig Dispense Refill   acetaminophen (TYLENOL) 500 MG tablet Take 500-1,000 mg by mouth every 6 (six) hours as needed (for back pain.). (Patient not taking: Reported on 18/56/3149)     folic acid (FOLVITE) 1 MG tablet Take 1 tablet (1 mg total) by mouth daily. 30 tablet 4   lidocaine-prilocaine (EMLA) cream Apply 1 application topically as needed. 30 g 2   magic mouthwash SOLN Take 5 mLs by mouth 3 (three) times daily as needed for mouth pain. (Patient not taking: Reported on 09/27/2021) 240 mL 0   prochlorperazine (COMPAZINE) 10 MG tablet Take 1 tablet (10 mg total) by mouth every 6 (six) hours as needed for nausea or vomiting. 30 tablet 0   traMADol (ULTRAM) 50 MG tablet Take 50 mg by mouth every 6 (six) hours as needed for severe pain. (Patient not taking: Reported on 09/27/2021)     zolpidem (AMBIEN) 10 MG tablet Take 5-10 mg by mouth at bedtime as needed for sleep. (Patient not taking: Reported on 09/27/2021)     No current facility-administered medications for this visit.    SURGICAL HISTORY:  Past Surgical History:  Procedure Laterality Date   BRONCHIAL BRUSHINGS  05/12/2021   Procedure: BRONCHIAL BRUSHINGS;  Surgeon: Garner Nash, DO;  Location: [facility];  Service: Pulmonary;;   BRONCHIAL DILITATION  05/12/2021   Procedure: BRONCHIAL DILITATION;  Surgeon: Garner Nash, DO;  Location: [facility];  Service: Pulmonary;;   BRONCHIAL NEEDLE  ASPIRATION BIOPSY  05/12/2021   Procedure: BRONCHIAL NEEDLE ASPIRATION BIOPSIES;  Surgeon: Garner Nash, DO;  Location: [facility];  Service: Pulmonary;;   BRONCHIAL WASHINGS  05/12/2021   Procedure: BRONCHIAL WASHINGS;  Surgeon: Garner Nash, DO;  Location: [facility];  Service: Pulmonary;;   CRYOTHERAPY  05/12/2021   Procedure: CRYOTHERAPY;  Surgeon: Garner Nash, DO;  Location: [facility];  Service: Pulmonary;;   HEMOSTASIS CONTROL  05/12/2021   Procedure: HEMOSTASIS CONTROL;  Surgeon: Garner Nash, DO;  Location: [facility];  Service: Pulmonary;;  epi injection   IR IMAGING GUIDED PORT INSERTION  06/29/2021   Spine injection     Pain control   VIDEO BRONCHOSCOPY WITH ENDOBRONCHIAL ULTRASOUND N/A 05/12/2021   Procedure: VIDEO BRONCHOSCOPY WITH ENDOBRONCHIAL ULTRASOUND;  Surgeon: Garner Nash, DO;  Location: [facility];  Service: Pulmonary;  Laterality: N/A;   WISDOM TOOTH EXTRACTION     no anesthesia involed    REVIEW OF SYSTEMS:   Review of Systems  Constitutional: Negative for appetite change, chills, fatigue, fever and unexpected weight change.  HENT:   Negative for mouth sores, nosebleeds, sore throat and trouble swallowing.   Eyes: Negative for eye problems and icterus.  Respiratory: Negative for cough, hemoptysis, shortness of breath and wheezing.   Cardiovascular: Negative for chest pain and leg swelling.  Gastrointestinal: Negative for abdominal pain, constipation, diarrhea, nausea and vomiting.  Genitourinary: Negative for bladder incontinence, difficulty urinating, dysuria, frequency and hematuria.  Musculoskeletal: Negative for back pain, gait problem, neck pain and neck stiffness.  Skin: Negative for itching and rash.  Neurological: Negative for dizziness, extremity weakness, gait problem, headaches, light-headedness and seizures.  Hematological: Negative for adenopathy. Does not bruise/bleed easily.  Psychiatric/Behavioral: Negative for  confusion, depression and sleep disturbance. The patient is not nervous/anxious.     PHYSICAL EXAMINATION:  There were no vitals taken for this visit.  ECOG PERFORMANCE STATUS: {CHL ONC ECOG [id number]  Physical Exam  Constitutional: Oriented to person, place, and time and well-developed, well-nourished, and in no distress. No distress.  HENT:  Head: Normocephalic and atraumatic.  Mouth/Throat: Oropharynx is clear and moist. No oropharyngeal exudate.  Eyes: Conjunctivae are normal. Right eye exhibits no discharge. Left eye exhibits no discharge. No scleral icterus.  Neck: Normal range of motion. Neck supple.  Cardiovascular: Normal rate, regular rhythm, normal heart sounds and intact distal pulses.   Pulmonary/Chest: Effort normal and breath sounds normal. No respiratory distress. No wheezes. No rales.  Abdominal: Soft. Bowel sounds are normal. Exhibits no distension and no mass. There is no tenderness.  Musculoskeletal: Normal range of motion. Exhibits no edema.  Lymphadenopathy:    No cervical adenopathy.  Neurological: Alert and oriented to person, place, and time. Exhibits normal muscle tone. Gait normal. Coordination normal.  Skin: Skin is warm and dry. No rash noted. Not diaphoretic. No erythema. No pallor.  Psychiatric: Mood, memory and judgment normal.  Vitals reviewed.  LABORATORY DATA: Lab Results  Component Value Date   WBC 6.1 11/09/2021   HGB 10.2 (L) 11/09/2021   HCT 30.7 (L) 11/09/2021   MCV 98.7 11/09/2021   PLT 333 11/09/2021      Chemistry      Component Value Date/Time   NA 141 11/09/2021 0939   K 4.1 11/09/2021 0939   CL 106 11/09/2021 0939   CO2 25 11/09/2021 0939   BUN 11 11/09/2021 0939   CREATININE 1.06 11/09/2021 0939      Component Value Date/Time   CALCIUM 9.1 11/09/2021 0939   ALKPHOS 101 11/09/2021 0939   AST 21 11/09/2021 0939   ALT 24 11/09/2021 0939   BILITOT 0.3 11/09/2021 0939       RADIOGRAPHIC STUDIES:  No results  found.   ASSESSMENT/PLAN:  No problem-specific Assessment & Plan notes found for this encounter.   No orders of the defined types were placed in this encounter.    I spent {CHL ONC TIME VISIT - UYQIH:[id number]} counseling the patient face to face. The total time spent in the appointment was {CHL ONC TIME VISIT - VFIEP:[id number]}.  Sincere Berlanga L Naija Troost, PA-C 11/22/21

## 2021-11-22 NOTE — Progress Notes (Signed)
[facility] HEMATOLOGY-ONCOLOG[facility] TeleHEALTH VISIT PROGRESS NOTE   I connected with Cameron Harper on 11/29/21 at  1:00 PM EST by telephone and verified that I am speaking with the correct person using two identifiers.  I discussed the limitations, risks, security and privacy concerns of performing an evaluation and management service by telemedicine and the availability of in-person appointments. I also discussed with the patient that there may be a patient responsible charge related to this service. The patient expressed understanding and agreed to proceed.  Other persons participating in the visit and their role in the encounter: None Patients location: Home   Providers location: [place], [place] [address]  DIAGNOSIS: Stage IV (T2b, N2, M1 B) non-small cell lung cancer, adenocarcinoma presented with large central left lower lobe lung mass with left hilar and mediastinal lymphadenopathy as well as abdominal retroperitoneal lymphadenopathy diagnosed in May 2022.  The patient also has bilateral parotid gland nodules that need close monitoring.   [facility] MOLECULAR STUDY: Results with Therapy Associations BIOMARKER METHOD ANALYTE RESULT THERAPY ASSOCIATION BIOMARKER LEVEL* .PD-L1 (22c3) IHC Protein Positive, TPS: 50% BENEFIT cemiplimab, pembrolizumab Level 1 .PD-L1 (28-8) IHC Protein Positive   1+, 50% BENEFIT nivolumab/ipilimumab combination Level 1 .TMB Seq DNA-Tumor High, 18 mut/Mb BENEFIT pembrolizumab Level 2 . alectinib, ceritinib, crizotinib, lorlatinib Level 1 . IHC Protein Negative   0 brigatinib Level 2 . ALK Seq RNA-Tumor Fusion Not Detected LACK OF BENEFIT alectinib, brigatinib, ceritinib, crizotinib, lorlatinib Level 2 .BRAF Seq DNA-Tumor Mutation Not Detected LACK OF BENEFIT dabrafenib and trametinib combination therapy, vemurafenib Level 2 .EGFR Seq DNA-Tumor  Mutation Not Detected LACK OF BENEFIT erlotinib, gefitinib Level 2 .KRAS Seq DNA-Tumor Mutation Not Detected LACK OF BENEFIT sotorasib Level 2 .RET Seq RNA-Tumor Fusion Not Detected LACK OF BENEFIT pralsetinib, selpercatinib Level 2 .ROS1 Seq RNA-Tumor Fusion Not Detected LACK OF BENEFIT ceritinib, crizotinib, entrectinib, lorlatinib Level 2 . CNA-Seq DNA-Tumor Amplification Not Detected . MET Seq RNA-Tumor Variant Transcript Not Detected LACK OF BENEFIT crizotinib Level 3  PRIOR THERAPY: Palliative radiotherapy to the large central left lower lobe lung mass under the care of Dr. Lisbeth Renshaw. Last treatment on 06/12/21.  CURRENT THERAPY: Systemic chemotherapy with carboplatin for AUC of 5, Alimta 500 Mg/M2 and Keytruda 200 Mg IV every 3 weeks.  First dose expected June 14, 2021.  Status post 8 cycles of treatment.  Starting from cycle #5, the patient will begin maintenance Keytruda and Alimta.  INTERVAL HISTORY: Cameron Harper 45 y.o. and I connected via a mychart virtual visit today. He is feeling fair. Generally, he is tolerating maintenance alimta and keytruda well without any concerning adverse side effects except for fatigue starting on day 3 after treatment which lingers for a few days and decreased appetite. However, he felt like his symptoms lingered longer than normal since his last cycle of treatment. He notes the fatigue has persisted. He also reports his appetite has not bounced back. He notes taste alterations. He also reports a mild cough following treatment which typically resolves after a few days; however, his usual dry cough has persisted. He has not tried taking anything for cough. Ednies sick contacts. Denies changes in shortness of breath compared to his baseline.   He denies any recent fever, chills, or night sweats. He typically drinks ensure only the days following treatment until his appetite improves, however, since his appetite has not improved, he has been drinking  more ensure daily. He denies any significant chest pain or hemoptysis. He sometimes has nausea without vomiting. Sometimes he gets nausea after having a coughing fit and gets choked up on phlegm/saliva. He notes after this occurs, he often has decreased appetite and aversion to food. He reports taste alterations. He is not sure if he has oral thrush. He denies any diarrhea or constipation.  He denies any rashes or skin changes. He denies any headaches or visual changes.   He is here today for evaluation before starting cycle #9.     MEDICAL HISTORY: Past Medical History:  Diagnosis Date   Chronic back pain    Eczema    GERD (gastroesophageal reflux disease)    Lung cancer ([facility])     ALLERGIES:  is allergic to penicillins.  MEDICATIONS:  Current Outpatient Medications  Medication Sig Dispense Refill   folic acid (FOLVITE) 1 MG tablet Take 1 tablet (1 mg total) by mouth daily. 30 tablet 4   lidocaine-prilocaine (EMLA) cream Apply 1 application topically as needed. 30 g 2   ondansetron (ZOFRAN-ODT) 4 MG disintegrating tablet Take 1 tablet (4 mg total) by mouth every 8 (eight) hours as needed for nausea or vomiting. 30 tablet 2   prochlorperazine (COMPAZINE) 10 MG tablet Take 1 tablet (10 mg total) by mouth every 6 (six) hours as needed for nausea or vomiting. 30 tablet 0   acetaminophen (TYLENOL) 500 MG tablet Take 500-1,000 mg by mouth every 6 (six) hours as needed (for back pain.). (Patient not taking: Reported on 09/27/2021)     magic mouthwash SOLN Take 5 mLs by mouth 3 (three) times daily as needed for mouth pain. (Patient not taking: Reported on 09/27/2021) 240 mL 0   traMADol (ULTRAM) 50 MG tablet Take 50 mg by mouth every 6 (six) hours as needed for severe pain. (Patient not taking: Reported on 09/27/2021)     zolpidem (AMBIEN) 10 MG tablet Take 5-10 mg by mouth at bedtime as needed for sleep. (Patient not taking: Reported on 09/27/2021)     No current facility-administered  medications for this visit.    SURGICAL HISTORY:  Past Surgical History:  Procedure Laterality Date   BRONCHIAL BRUSHINGS  05/12/2021   Procedure: BRONCHIAL BRUSHINGS;  Surgeon: Garner Nash, DO;  Location: [facility];  Service: Pulmonary;;   BRONCHIAL DILITATION  05/12/2021   Procedure: BRONCHIAL DILITATION;  Surgeon: Garner Nash, DO;  Location: [facility];  Service: Pulmonary;;   BRONCHIAL NEEDLE ASPIRATION BIOPSY  05/12/2021   Procedure: BRONCHIAL NEEDLE ASPIRATION BIOPSIES;  Surgeon: Garner Nash, DO;  Location: [facility];  Service: Pulmonary;;   BRONCHIAL WASHINGS  05/12/2021   Procedure: BRONCHIAL WASHINGS;  Surgeon: Garner Nash, DO;  Location: [facility];  Service: Pulmonary;;   CRYOTHERAPY  05/12/2021   Procedure: CRYOTHERAPY;  Surgeon: Garner Nash, DO;  Location: [facility];  Service: Pulmonary;;   HEMOSTASIS CONTROL  05/12/2021   Procedure: HEMOSTASIS CONTROL;  Surgeon: Garner Nash, DO;  Location: [facility];  Service: Pulmonary;;  epi injection   IR IMAGING GUIDED PORT INSERTION  06/29/2021   Spine injection     Pain control   VIDEO BRONCHOSCOPY WITH ENDOBRONCHIAL ULTRASOUND N/A 05/12/2021   Procedure: VIDEO BRONCHOSCOPY WITH ENDOBRONCHIAL ULTRASOUND;  Surgeon: Garner Nash, DO;  Location: [facility];  Service: Pulmonary;  Laterality: N/A;   WISDOM TOOTH EXTRACTION     no anesthesia involed    REVIEW OF SYSTEMS:   Review of Systems  Constitutional: Positive  for decreased appetite. Negative for appetite change, chills, fever and unexpected weight change.  HENT: Negative for mouth sores, nosebleeds, sore throat and trouble swallowing.   Eyes: Negative for eye problems and icterus.  Respiratory: Positive for worsening cough and mild dyspnea on exertion. Negative for hemoptysis and wheezing.   Cardiovascular: Negative for chest pain and leg swelling.  Gastrointestinal: Positive for occasional nausea without vomiting.  Negative for  abdominal pain, constipation, diarrhea, and vomiting.  Genitourinary: Negative for bladder incontinence, difficulty urinating, dysuria, frequency and hematuria.   Musculoskeletal: Negative for gait problem, neck pain and neck stiffness.  Skin: Positive for occasional itching/rash.  Has a history of eczema. Neurological: Negative for dizziness, extremity weakness, gait problem, headaches, light-headedness and seizures.  Hematological: Negative for adenopathy. Does not bruise/bleed easily.  Psychiatric/Behavioral: Negative for confusion, depression and sleep disturbance. The patient is not nervous/anxious.    PHYSICAL EXAMINATION:  There were no vitals taken for this visit.  ECOG PERFORMANCE STATUS: 1  Physical Exam  Constitutional: Oriented to person, place, and time and well-developed, well-nourished, and in no distress.  HENT:  Head: Normocephalic and atraumatic. Unable to assess on camera if he has oral thrush.  Psychiatric: Mood, memory and judgment normal.  Vitals reviewed.  LABORATORY DATA: Lab Results  Component Value Date   WBC 6.1 11/09/2021   HGB 10.2 (L) 11/09/2021   HCT 30.7 (L) 11/09/2021   MCV 98.7 11/09/2021   PLT 333 11/09/2021      Chemistry      Component Value Date/Time   NA 141 11/09/2021 0939   K 4.1 11/09/2021 0939   CL 106 11/09/2021 0939   CO2 25 11/09/2021 0939   BUN 11 11/09/2021 0939   CREATININE 1.06 11/09/2021 0939      Component Value Date/Time   CALCIUM 9.1 11/09/2021 0939   ALKPHOS 101 11/09/2021 0939   AST 21 11/09/2021 0939   ALT 24 11/09/2021 0939   BILITOT 0.3 11/09/2021 0939       RADIOGRAPHIC STUDIES:  No results found.   ASSESSMENT/PLAN:  This is a very pleasant 45 year old Caucasian male diagnosed with stage IV (T2b, N2, M1 B) non-small cell lung cancer, adenocarcinoma.  He presented with a large central left lower lobe lung mass in addition to left hilar mediastinal lymphadenopathy.  He also has retroperitoneal abdominal  lymphadenopathy.  He was diagnosed in May 2022.  His PD-L1 expression is 50%.  His molecular studies by [facility] did not show any evidence of any actionable mutations.  He underwent palliative radiotherapy to the large left lower lobe lung mass under the care of Dr. Lisbeth Renshaw.  He completed this on 06/12/2021.   The patient is currently undergoing systemic chemotherapy with carboplatin for an AUC of 5, Alimta 500 mg per metered squared, and Keytruda 200 mg IV every 3 weeks.  He is status post 8 cycles.  Starting from cycle #5, the patient has been on maintenance Alimta and Keytruda.  Recommend that he proceed with cycle #9 tomorrow as scheduled as long as his labs are adequate.   I will arrange for a restaging CT scan prior to starting his next cycle of treatment.   We will see him back for a follow up visit in 3 weeks for evaluation and repeat blood work before starting cycle #10.   For the patient's cough, discussed that we can assess this on his upcoming CT scan; however, if he feels like it needs to be evaluated sooner for possible infection I did offer  chest x-ray.  The patient does not feel like a chest x-ray is needed at this time.  In that case, I encouraged him to use Delsym or Robitussin to see if that helps his cough.  Encouraged him to continue to drink Ensure to increase his calorie intake on days that he has decreased appetite.  Encouraged him to use salt water rinses and Biotene for his taste changes.  Discussed some patients find helpful to take antiemetic prior to eating to help with the taste aversions/smells with food.  I sent in a prescription for Zofran ODT to take every 8 hours if needed for nausea.  He reports that sometimes he has intermittent skin rashes.  He has a history of eczema.  Discussed regardless if this is eczema or immunotherapy mild skin rashes he may use hydrocortisone for itching if needed.  I discussed the assessment and treatment plan with the patient. The patient  was provided an opportunity to ask questions and all were answered. The patient agreed with the plan and demonstrated an understanding of the instructions.  The patient was advised to call back or seek an in-person evaluation if the symptoms worsen or if the condition fails to improve as anticipated.  I provided 20-29 minutes of non face-to-face telephone visit time during this encounter, and > 50% was spent counseling as documented under my assessment & plan.  Ellarae Nevitt L Milanya Sunderland, PA-C 11/29/2021 1:43 PM  Orders Placed This Encounter  Procedures   CT Chest W Contrast    Standing Status:   Future    Standing Expiration Date:   11/29/2022    Order Specific Question:   If indicated for the ordered procedure, I authorize the administration of contrast media per Radiology protocol    Answer:   Yes    Order Specific Question:   Preferred imaging location?    Answer:   [facility]   CT Abdomen Pelvis W Contrast    Standing Status:   Future    Standing Expiration Date:   11/29/2022    Order Specific Question:   If indicated for the ordered procedure, I authorize the administration of contrast media per Radiology protocol    Answer:   Yes    Order Specific Question:   Preferred imaging location?    Answer:   [facility]    Order Specific Question:   Is Oral Contrast requested for this exam?    Answer:   Yes, Per Radiology protocol     Hikari Tripp L Deeanne Deininger, PA-C 11/29/21

## 2021-11-29 ENCOUNTER — Encounter: Payer: Self-pay | Admitting: Physician Assistant

## 2021-11-29 ENCOUNTER — Inpatient Hospital Stay (HOSPITAL_BASED_OUTPATIENT_CLINIC_OR_DEPARTMENT_OTHER): Payer: Managed Care, Other (non HMO) | Admitting: Physician Assistant

## 2021-11-29 ENCOUNTER — Telehealth: Payer: Self-pay | Admitting: Medical Oncology

## 2021-11-29 ENCOUNTER — Other Ambulatory Visit: Payer: Managed Care, Other (non HMO)

## 2021-11-29 ENCOUNTER — Other Ambulatory Visit: Payer: Self-pay | Admitting: Medical Oncology

## 2021-11-29 DIAGNOSIS — R110 Nausea: Secondary | ICD-10-CM

## 2021-11-29 DIAGNOSIS — Z5112 Encounter for antineoplastic immunotherapy: Secondary | ICD-10-CM

## 2021-11-29 DIAGNOSIS — C3432 Malignant neoplasm of lower lobe, left bronchus or lung: Secondary | ICD-10-CM

## 2021-11-29 DIAGNOSIS — Z5111 Encounter for antineoplastic chemotherapy: Secondary | ICD-10-CM

## 2021-11-29 MED ORDER — ONDANSETRON 4 MG PO TBDP
4.0000 mg | ORAL_TABLET | Freq: Three times a day (TID) | ORAL | 2 refills | Status: DC | PRN
Start: 2021-11-29 — End: 2022-04-05

## 2021-11-29 NOTE — Telephone Encounter (Signed)
Port flush with lab r/s .

## 2021-11-30 ENCOUNTER — Inpatient Hospital Stay: Payer: Managed Care, Other (non HMO)

## 2021-11-30 ENCOUNTER — Other Ambulatory Visit: Payer: Self-pay

## 2021-11-30 VITALS — BP 139/75 | HR 84 | Temp 98.7°F | Resp 18 | Ht 69.0 in | Wt 166.5 lb

## 2021-11-30 DIAGNOSIS — C3432 Malignant neoplasm of lower lobe, left bronchus or lung: Secondary | ICD-10-CM

## 2021-11-30 DIAGNOSIS — Z5112 Encounter for antineoplastic immunotherapy: Secondary | ICD-10-CM

## 2021-11-30 DIAGNOSIS — Z95828 Presence of other vascular implants and grafts: Secondary | ICD-10-CM

## 2021-11-30 LAB — CMP (CANCER CENTER ONLY)
ALT: 21 U/L (ref 0–44)
AST: 19 U/L (ref 15–41)
Albumin: 3.4 g/dL — ABNORMAL LOW (ref 3.5–5.0)
Alkaline Phosphatase: 95 U/L (ref 38–126)
Anion gap: 8 (ref 5–15)
BUN: 12 mg/dL (ref 6–20)
CO2: 28 mmol/L (ref 22–32)
Calcium: 9 mg/dL (ref 8.9–10.3)
Chloride: 102 mmol/L (ref 98–111)
Creatinine: 1.05 mg/dL (ref 0.61–1.24)
GFR, Estimated: >60 mL/min (ref >60)
Glucose, Bld: 104 mg/dL — ABNORMAL HIGH (ref 70–99)
Potassium: 3.7 mmol/L (ref 3.5–5.1)
Sodium: 138 mmol/L (ref 135–145)
Total Bilirubin: 0.2 mg/dL — ABNORMAL LOW (ref 0.3–1.2)
Total Protein: 7.6 g/dL (ref 6.5–8.1)

## 2021-11-30 LAB — CBC WITH DIFFERENTIAL (CANCER CENTER ONLY)
Abs Immature Granulocytes: 0.04 10*3/uL (ref 0.00–0.07)
Basophils Absolute: 0.1 10*3/uL (ref 0.0–0.1)
Basophils Relative: 1 %
Eosinophils Absolute: 0.3 10*3/uL (ref 0.0–0.5)
Eosinophils Relative: 4 %
HCT: 29 % — ABNORMAL LOW (ref 39.0–52.0)
Hemoglobin: 9.3 g/dL — ABNORMAL LOW (ref 13.0–17.0)
Immature Granulocytes: 1 %
Lymphocytes Relative: 11 %
Lymphs Abs: 0.8 10*3/uL (ref 0.7–4.0)
MCH: 30.8 pg (ref 26.0–34.0)
MCHC: 32.1 g/dL (ref 30.0–36.0)
MCV: 96 fL (ref 80.0–100.0)
Monocytes Absolute: 1.3 10*3/uL — ABNORMAL HIGH (ref 0.1–1.0)
Monocytes Relative: 17 %
Neutro Abs: 4.8 10*3/uL (ref 1.7–7.7)
Neutrophils Relative %: 66 %
Platelet Count: 449 10*3/uL — ABNORMAL HIGH (ref 150–400)
RBC: 3.02 MIL/uL — ABNORMAL LOW (ref 4.22–5.81)
RDW: 13 % (ref 11.5–15.5)
WBC Count: 7.2 10*3/uL (ref 4.0–10.5)
nRBC: 0 % (ref 0.0–0.2)

## 2021-11-30 LAB — TSH: TSH: 2.16 u[IU]/mL (ref 0.320–4.118)

## 2021-11-30 MED ORDER — SODIUM CHLORIDE 0.9% FLUSH
10.0000 mL | Freq: Once | INTRAVENOUS | Status: AC
  Administered 2021-11-30: 13:00:00 10 mL

## 2021-11-30 MED ORDER — SODIUM CHLORIDE 0.9 % IV SOLN
200.0000 mg | Freq: Once | INTRAVENOUS | Status: AC
  Administered 2021-11-30: 14:00:00 200 mg via INTRAVENOUS
  Filled 2021-11-30: qty 8

## 2021-11-30 MED ORDER — CYANOCOBALAMIN 1000 MCG/ML IJ SOLN
1000.0000 ug | Freq: Once | INTRAMUSCULAR | Status: AC
  Administered 2021-11-30: 14:00:00 1000 ug via INTRAMUSCULAR
  Filled 2021-11-30: qty 1

## 2021-11-30 MED ORDER — SODIUM CHLORIDE 0.9 % IV SOLN: Freq: Once | INTRAVENOUS | Status: AC

## 2021-11-30 MED ORDER — SODIUM CHLORIDE 0.9 % IV SOLN
500.0000 mg/m2 | Freq: Once | INTRAVENOUS | Status: AC
  Administered 2021-11-30: 15:00:00 1000 mg via INTRAVENOUS
  Filled 2021-11-30: qty 40

## 2021-11-30 MED ORDER — SODIUM CHLORIDE 0.9 % IV SOLN: Freq: Once | INTRAVENOUS | Status: DC

## 2021-11-30 MED ORDER — PROCHLORPERAZINE MALEATE 10 MG PO TABS
10.0000 mg | ORAL_TABLET | Freq: Once | ORAL | Status: AC
  Administered 2021-11-30: 14:00:00 10 mg via ORAL
  Filled 2021-11-30: qty 1

## 2021-11-30 NOTE — Patient Instructions (Signed)
[facility]  Discharge Instructions: Thank you for choosing [facility] to provide your oncology and hematology care.   If you have a lab appointment with the Cancer Center, please go directly to the Cancer Center and check in at the registration area.   Wear comfortable clothing and clothing appropriate for easy access to any Portacath or PICC line.   We strive to give you quality time with your provider. You may need to reschedule your appointment if you arrive late (15 or more minutes).  Arriving late affects you and other patients whose appointments are after yours.  Also, if you miss three or more appointments without notifying the office, you may be dismissed from the clinic at the provider's discretion.      For prescription refill requests, have your pharmacy contact our office and allow 72 hours for refills to be completed.    Today you received the following chemotherapy and/or immunotherapy agents: Keytruda/Alimta.      To help prevent nausea and vomiting after your treatment, we encourage you to take your nausea medication as directed.  BELOW ARE SYMPTOMS THAT SHOULD BE REPORTED IMMEDIATELY: *FEVER GREATER THAN 100.4 F (38 C) OR HIGHER *CHILLS OR SWEATING *NAUSEA AND VOMITING THAT IS NOT CONTROLLED WITH YOUR NAUSEA MEDICATION *UNUSUAL SHORTNESS OF BREATH *UNUSUAL BRUISING OR BLEEDING *URINARY PROBLEMS (pain or burning when urinating, or frequent urination) *BOWEL PROBLEMS (unusual diarrhea, constipation, pain near the anus) TENDERNESS IN MOUTH AND THROAT WITH OR WITHOUT PRESENCE OF ULCERS (sore throat, sores in mouth, or a toothache) UNUSUAL RASH, SWELLING OR PAIN  UNUSUAL VAGINAL DISCHARGE OR ITCHING   Items with * indicate a potential emergency and should be followed up as soon as possible or go to the Emergency Department if any problems should occur.  Please show the CHEMOTHERAPY ALERT CARD or IMMUNOTHERAPY ALERT CARD at  check-in to the Emergency Department and triage nurse.  Should you have questions after your visit or need to cancel or reschedule your appointment, please contact [facility]  Dept: [phone]  and follow the prompts.  Office hours are 8:00 a.m. to 4:30 p.m. Monday - Friday. Please note that voicemails left after 4:00 p.m. may not be returned until the following business day.  We are closed weekends and major holidays. You have access to a nurse at all times for urgent questions. Please call the main number to the clinic Dept: [phone] and follow the prompts.   For any non-urgent questions, you may also contact your provider using MyChart. We now offer e-Visits for anyone 18 and older to request care online for non-urgent symptoms. For details visit [email or web address].   Also download the MyChart app! Go to the app store, search "MyChart", open the app, select [facility], and log in with your MyChart username and password.  Due to Covid, a mask is required upon entering the hospital/clinic. If you do not have a mask, one will be given to you upon arrival. For doctor visits, patients may have 1 support person aged 18 or older with them. For treatment visits, patients cannot have anyone with them due to current Covid guidelines and our immunocompromised population.   

## 2021-12-05 ENCOUNTER — Encounter: Payer: Self-pay | Admitting: *Deleted

## 2021-12-11 ENCOUNTER — Telehealth: Payer: Self-pay | Admitting: *Deleted

## 2021-12-11 NOTE — Telephone Encounter (Signed)
Voicemail requesting return call regarding e-mail received regarding [employer] request. Connected with Cameron Harper [phone]).  Who asked what needs to be done.  "No one called me.  What needs to be done?" Asked if able to sign release and return by e-mail or delivery to office and any communication with Shawna Orleans made or received for records from August to present.  (SW) H.I.M. unable to release records as received.  "I just saw this message.  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  I already signed a release and should not have to explain what [facility] needs to [place]."   12/05/2021 portal message and [facility] Authorization for Release/Disclosure attachment e-mailed to [email or web address] from [email or web address] after unsuccessful call, noting communication preference.   Advised this nurse will notify [place].   He needs to communicate with (SW) H.I.M. [phone] opt 2) to provide authorization, ensure process started and completed.  [facility] office unable see work of this office.     Voicemail identified for [employer] Cameron Harper [phone] ext [phone]) includes explanation [facility] Information Management office unable to release records request.  Request must be: Addressed to our legal name "[facility]" not an individual provider.   H.I.P.A.A. approved release of information required. Provided office fax number [phone] or [phone].

## 2021-12-14 NOTE — Telephone Encounter (Addendum)
Nurse delivered [employer] request letter to this forms nurse.  Correctly addressed to "[facility]" however request is initial Long-Term Disability request claim number [id number] with attending physician statement.  [place] authorization noted in Media signed today by patient.   Initial short term request prepared for (SW) H.I.M. to process.   Long-term request pending.

## 2021-12-19 ENCOUNTER — Ambulatory Visit (HOSPITAL_COMMUNITY)
Admission: RE | Admit: 2021-12-19 | Discharge: 2021-12-19 | Disposition: A | Discharge status: Home / Self Care | Payer: Managed Care, Other (non HMO) | Source: Ambulatory Visit | Attending: Physician Assistant | Admitting: Physician Assistant

## 2021-12-19 ENCOUNTER — Encounter (HOSPITAL_COMMUNITY): Payer: Self-pay

## 2021-12-19 ENCOUNTER — Other Ambulatory Visit: Payer: Self-pay

## 2021-12-19 DIAGNOSIS — C3432 Malignant neoplasm of lower lobe, left bronchus or lung: Secondary | ICD-10-CM | POA: Diagnosis not present

## 2021-12-19 MED ORDER — SODIUM CHLORIDE (PF) 0.9 % IJ SOLN
INTRAMUSCULAR | Status: AC
  Filled 2021-12-19: qty 50

## 2021-12-19 MED ORDER — HEPARIN SOD (PORK) LOCK FLUSH 100 UNIT/ML IV SOLN: 500.0000 [IU] | Freq: Once | INTRAVENOUS | Status: AC

## 2021-12-19 MED ORDER — IOHEXOL 300 MG/ML  SOLN
100.0000 mL | Freq: Once | INTRAMUSCULAR | Status: AC | PRN
  Administered 2021-12-19: 100 mL via INTRAVENOUS

## 2021-12-19 MED ORDER — HEPARIN SOD (PORK) LOCK FLUSH 100 UNIT/ML IV SOLN
INTRAVENOUS | Status: AC
  Administered 2021-12-19: 500 [IU] via INTRAVENOUS
  Filled 2021-12-19: qty 5

## 2021-12-19 NOTE — Progress Notes (Signed)
[facility] OFFICE PROGRESS NOTE  [name], MD [place] [address]  DIAGNOSIS:  Stage IV (T2b, N2, M1 B) non-small cell lung cancer, adenocarcinoma presented with large central left lower lobe lung mass with left hilar and mediastinal lymphadenopathy as well as abdominal retroperitoneal lymphadenopathy diagnosed in May 2022.  The patient also has bilateral parotid gland nodules that need close monitoring.   [facility] MOLECULAR STUDY: Results with Therapy Associations BIOMARKER METHOD ANALYTE RESULT THERAPY ASSOCIATION BIOMARKER LEVEL* .PD-L1 (22c3) IHC Protein Positive, TPS: 50% BENEFIT cemiplimab, pembrolizumab Level 1 .PD-L1 (28-8) IHC Protein Positive   1+, 50% BENEFIT nivolumab/ipilimumab combination Level 1 .TMB Seq DNA-Tumor High, 18 mut/Mb BENEFIT pembrolizumab Level 2 . alectinib, ceritinib, crizotinib, lorlatinib Level 1 . IHC Protein Negative   0 brigatinib Level 2 . ALK Seq RNA-Tumor Fusion Not Detected LACK OF BENEFIT alectinib, brigatinib, ceritinib, crizotinib, lorlatinib Level 2 .BRAF Seq DNA-Tumor Mutation Not Detected LACK OF BENEFIT dabrafenib and trametinib combination therapy, vemurafenib Level 2 .EGFR Seq DNA-Tumor Mutation Not Detected LACK OF BENEFIT erlotinib, gefitinib Level 2 .KRAS Seq DNA-Tumor Mutation Not Detected LACK OF BENEFIT sotorasib Level 2 .RET Seq RNA-Tumor Fusion Not Detected LACK OF BENEFIT pralsetinib, selpercatinib Level 2 .ROS1 Seq RNA-Tumor Fusion Not Detected LACK OF BENEFIT ceritinib, crizotinib, entrectinib, lorlatinib Level 2 . CNA-Seq DNA-Tumor Amplification Not Detected . MET Seq RNA-Tumor Variant Transcript Not Detected LACK OF BENEFIT crizotinib Level 3  PRIOR THERAPY: Palliative radiotherapy to the large central left lower lobe lung mass under the care of Dr. Lisbeth Renshaw. Last treatment on 06/12/21.  CURRENT THERAPY: Systemic chemotherapy with carboplatin for AUC of 5, Alimta 500 Mg/M2  and Keytruda 200 Mg IV every 3 weeks.  First dose expected June 14, 2021.  Status post 9 cycles of treatment.  Starting from cycle #5, the patient will begin maintenance Keytruda and Alimta.  INTERVAL HISTORY: Cameron Harper 46 y.o. male returns to the clinic today for a follow-up visit.  The patient is feeling "fine" today without any concerning complaints. At his last appointment, he endorsed more fatigue and decreased appetite after treatment. He states he tolerated his last cycle of treatment well with his normal duration of side effects.  He reports his fatigue starts approximately 3 days after treatment as well as decreased appetite. The patient reports that he experiences a dry cough for few days after treatment that subsides.  Denies any sick contacts.  He reports his baseline shortness of breath with "a whole lot" of exertion, but denies any shortness of breath with normal day-to-day activities.  Denies any fever, chills, night sweats. He reports his appetite is "fine".  Denies any nausea, vomiting, diarrhea, or constipation.  Denies any chest pain or hemoptysis. She sometimes has coughing spells.  He had a coughing spell in the interval and then had some back discomfort after this.  He use Tylenol and heating pads and his symptoms improved.  Denies any headache or visual changes.  Denies any rashes or skin changes.  The patient recently had a restaging CT scan performed.  The patient is here today for evaluation and to review his scan results before starting cycle #10.    MEDICAL HISTORY: Past Medical History:  Diagnosis Date   Chronic back pain    Eczema    GERD (gastroesophageal reflux disease)    Lung cancer (HCC)     ALLERGIES:  is allergic to penicillins.  MEDICATIONS:  Current Outpatient Medications  Medication Sig Dispense Refill   folic  acid (FOLVITE) 1 MG tablet Take 1 tablet (1 mg total) by mouth daily. 30 tablet 4   lidocaine-prilocaine (EMLA) cream Apply 1 application  topically as needed. 30 g 2   ondansetron (ZOFRAN-ODT) 4 MG disintegrating tablet Take 1 tablet (4 mg total) by mouth every 8 (eight) hours as needed for nausea or vomiting. 30 tablet 2   prochlorperazine (COMPAZINE) 10 MG tablet Take 1 tablet (10 mg total) by mouth every 6 (six) hours as needed for nausea or vomiting. 30 tablet 0   acetaminophen (TYLENOL) 500 MG tablet Take 500-1,000 mg by mouth every 6 (six) hours as needed (for back pain.). (Patient not taking: Reported on 09/27/2021)     magic mouthwash SOLN Take 5 mLs by mouth 3 (three) times daily as needed for mouth pain. (Patient not taking: Reported on 09/27/2021) 240 mL 0   traMADol (ULTRAM) 50 MG tablet Take 50 mg by mouth every 6 (six) hours as needed for severe pain. (Patient not taking: Reported on 09/27/2021)     zolpidem (AMBIEN) 10 MG tablet Take 5-10 mg by mouth at bedtime as needed for sleep. (Patient not taking: Reported on 09/27/2021)     No current facility-administered medications for this visit.    SURGICAL HISTORY:  Past Surgical History:  Procedure Laterality Date   BRONCHIAL BRUSHINGS  05/12/2021   Procedure: BRONCHIAL BRUSHINGS;  Surgeon: Garner Nash, DO;  Location: [facility];  Service: Pulmonary;;   BRONCHIAL DILITATION  05/12/2021   Procedure: BRONCHIAL DILITATION;  Surgeon: Garner Nash, DO;  Location: [facility];  Service: Pulmonary;;   BRONCHIAL NEEDLE ASPIRATION BIOPSY  05/12/2021   Procedure: BRONCHIAL NEEDLE ASPIRATION BIOPSIES;  Surgeon: Garner Nash, DO;  Location: [facility];  Service: Pulmonary;;   BRONCHIAL WASHINGS  05/12/2021   Procedure: BRONCHIAL WASHINGS;  Surgeon: Garner Nash, DO;  Location: [facility];  Service: Pulmonary;;   CRYOTHERAPY  05/12/2021   Procedure: CRYOTHERAPY;  Surgeon: Garner Nash, DO;  Location: [facility];  Service: Pulmonary;;   HEMOSTASIS CONTROL  05/12/2021   Procedure: HEMOSTASIS CONTROL;  Surgeon: Garner Nash, DO;  Location: [place];   Service: Pulmonary;;  epi injection   IR IMAGING GUIDED PORT INSERTION  06/29/2021   Spine injection     Pain control   VIDEO BRONCHOSCOPY WITH ENDOBRONCHIAL ULTRASOUND N/A 05/12/2021   Procedure: VIDEO BRONCHOSCOPY WITH ENDOBRONCHIAL ULTRASOUND;  Surgeon: Garner Nash, DO;  Location: [facility];  Service: Pulmonary;  Laterality: N/A;   WISDOM TOOTH EXTRACTION     no anesthesia involed    REVIEW OF SYSTEMS:   Constitutional: Positive for baseline fatigue and decreased appetite a few days following treatment.  Negative for chills, fever and unexpected weight change.  HENT: Negative for mouth sores, nosebleeds, sore throat and trouble swallowing.   Eyes: Negative for eye problems and icterus.  Respiratory: Positive for mild cough and mild dyspnea on exertion with strenuous activities. Negative for hemoptysis and wheezing.   Cardiovascular: Negative for chest pain and leg swelling.  Gastrointestinal: Negative for abdominal pain, constipation, diarrhea, nausea and vomiting.  Genitourinary: Negative for bladder incontinence, difficulty urinating, dysuria, frequency and hematuria.   Musculoskeletal: Back pain improved.  Negative for gait problem, neck pain and neck stiffness.  Skin: Negative for itching and rash.  Neurological: Negative for dizziness, extremity weakness, gait problem, headaches, light-headedness and seizures.  Hematological: Negative for adenopathy. Does not bruise/bleed easily.  Psychiatric/Behavioral: Negative for confusion, depression and sleep disturbance. The patient is not nervous/anxious.  PHYSICAL EXAMINATION:  Blood pressure 118/72, pulse 88, temperature 98.1 F (36.7 C), temperature source Axillary, resp. rate 17, height _0  (1.753 m), weight 166 lb 6.4 oz (75.5 kg), SpO2 100 %.  ECOG PERFORMANCE STATUS: 1  Physical Exam  Constitutional: Oriented to person, place, and time and well-developed, well-nourished, and in no distress.  HENT:  Head:  Normocephalic and atraumatic.  Mouth/Throat: Oropharynx is clear and moist. Poor dentition and gingival disease. No oropharyngeal exudate.  Eyes: Conjunctivae are normal. Right eye exhibits no discharge. Left eye exhibits no discharge. No scleral icterus.  Neck: Normal range of motion. Neck supple.  Cardiovascular: Normal rate, regular rhythm, normal heart sounds and intact distal pulses.   Pulmonary/Chest: Effort normal and breath sounds normal. No respiratory distress. No wheezes. No rales.  Abdominal: Soft. Bowel sounds are normal. Exhibits no distension and no mass. There is no tenderness.  Musculoskeletal: Normal range of motion. Exhibits no edema. No calf pain to palpation, erythema, or swelling. Lymphadenopathy:    No cervical adenopathy.  Neurological: Alert and oriented to person, place, and time. Exhibits normal muscle tone. Gait normal. Coordination normal.  Skin: Skin is warm and dry. No rash noted. Not diaphoretic. No erythema. No pallor.  Psychiatric: Mood, memory and judgment normal.  Vitals reviewed.    LABORATORY DATA: Lab Results  Component Value Date   WBC 6.5 12/21/2021   HGB 9.3 (L) 12/21/2021   HCT 28.2 (L) 12/21/2021   MCV 94.0 12/21/2021   PLT 395 12/21/2021      Chemistry      Component Value Date/Time   NA 140 12/21/2021 0952   K 4.0 12/21/2021 0952   CL 104 12/21/2021 0952   CO2 28 12/21/2021 0952   BUN 11 12/21/2021 0952   CREATININE 1.05 12/21/2021 0952      Component Value Date/Time   CALCIUM 9.4 12/21/2021 0952   ALKPHOS 100 12/21/2021 0952   AST 19 12/21/2021 0952   ALT 19 12/21/2021 0952   BILITOT 0.2 (L) 12/21/2021 0952       RADIOGRAPHIC STUDIES:  CT Chest W Contrast  Result Date: 12/19/2021 CLINICAL DATA:  Primary Cancer Type: Lung Imaging Indication: Assess response to therapy Interval therapy since last imaging? Yes Initial Cancer Diagnosis Date: 05/12/2021; Established by: Biopsy-proven Detailed Pathology: Stage IV non-small  cell lung cancer, adenocarcinoma. Primary Tumor location:  Left hilum and central left lower lobe. Surgeries: No. Chemotherapy: Yes; Ongoing? Yes; Most recent administration: 11/30/2021 Immunotherapy?  Yes; Type: Keytruda; Ongoing? Yes Radiation therapy? Yes; Date Range: 05/22/2021 - 06/12/2021; Target: Left lung EXAM: CT CHEST, ABDOMEN, AND PELVIS WITH CONTRAST TECHNIQUE: Multidetector CT imaging of the chest, abdomen and pelvis was performed following the standard protocol during bolus administration of intravenous contrast. RADIATION DOSE REDUCTION: This exam was performed according to the departmental dose-optimization program which includes automated exposure control, adjustment of the mA and/or kV according to patient size and/or use of iterative reconstruction technique. CONTRAST:  181m OMNIPAQUE IOHEXOL 300 MG/ML  SOLN COMPARISON:  Most recent CT chest, abdomen and pelvis 10/16/2021. 05/02/2021 PET-CT. FINDINGS: CT CHEST FINDINGS Cardiovascular: Port in the anterior chest wall with tip in distal SVC. No significant vascular findings. Normal heart size. No pericardial effusion. Mediastinum/Nodes: No axillary or supraclavicular adenopathy. No mediastinal or hilar adenopathy. No pericardial fluid. Esophagus normal. Lungs/Pleura: Perihilar interstitial thickening in the LEFT lung suggest radiation change. The interstitial thickening is slightly increased from comparison exam. No discrete measurable nodularity. Chronic small LEFT effusion. RIGHT lung is clear. Musculoskeletal:  No aggressive osseous lesion. CT ABDOMEN AND PELVIS FINDINGS Hepatobiliary: No focal hepatic lesion. No biliary ductal dilatation. Gallbladder is normal. Common bile duct is normal. Pancreas: Pancreas is normal. No ductal dilatation. No pancreatic inflammation. Spleen: Normal spleen Adrenals/urinary tract: Adrenal glands and kidneys are normal. The ureters and bladder normal. Stomach/Bowel: Stomach, small bowel, appendix, and cecum are  normal. The colon and rectosigmoid colon are normal. Vascular/Lymphatic: Abdominal aorta is normal caliber. There is no retroperitoneal or periportal lymphadenopathy. No pelvic lymphadenopathy. Reproductive: Prostate normal Other: No free fluid. Musculoskeletal: No aggressive osseous lesion. IMPRESSION: Chest Impression: 1. Mild progression of interstitial thickening in the LEFT perihilar lung most consistent with involving radiation change. 2. No evidence of new or progressive lung cancer in thorax. 3. Stable small LEFT effusion. 4. No adenopathy. Abdomen / Pelvis Impression: 1.  No evidence of metastatic disease in the abdomen pelvis 2.  No skeletal metastasis. Electronically Signed   By: Suzy Bouchard M.D.   On: 12/19/2021 12:19   CT Abdomen Pelvis W Contrast  Result Date: 12/19/2021 CLINICAL DATA:  Primary Cancer Type: Lung Imaging Indication: Assess response to therapy Interval therapy since last imaging? Yes Initial Cancer Diagnosis Date: 05/12/2021; Established by: Biopsy-proven Detailed Pathology: Stage IV non-small cell lung cancer, adenocarcinoma. Primary Tumor location:  Left hilum and central left lower lobe. Surgeries: No. Chemotherapy: Yes; Ongoing? Yes; Most recent administration: 11/30/2021 Immunotherapy?  Yes; Type: Keytruda; Ongoing? Yes Radiation therapy? Yes; Date Range: 05/22/2021 - 06/12/2021; Target: Left lung EXAM: CT CHEST, ABDOMEN, AND PELVIS WITH CONTRAST TECHNIQUE: Multidetector CT imaging of the chest, abdomen and pelvis was performed following the standard protocol during bolus administration of intravenous contrast. RADIATION DOSE REDUCTION: This exam was performed according to the departmental dose-optimization program which includes automated exposure control, adjustment of the mA and/or kV according to patient size and/or use of iterative reconstruction technique. CONTRAST:  132m OMNIPAQUE IOHEXOL 300 MG/ML  SOLN COMPARISON:  Most recent CT chest, abdomen and pelvis  10/16/2021. 05/02/2021 PET-CT. FINDINGS: CT CHEST FINDINGS Cardiovascular: Port in the anterior chest wall with tip in distal SVC. No significant vascular findings. Normal heart size. No pericardial effusion. Mediastinum/Nodes: No axillary or supraclavicular adenopathy. No mediastinal or hilar adenopathy. No pericardial fluid. Esophagus normal. Lungs/Pleura: Perihilar interstitial thickening in the LEFT lung suggest radiation change. The interstitial thickening is slightly increased from comparison exam. No discrete measurable nodularity. Chronic small LEFT effusion. RIGHT lung is clear. Musculoskeletal: No aggressive osseous lesion. CT ABDOMEN AND PELVIS FINDINGS Hepatobiliary: No focal hepatic lesion. No biliary ductal dilatation. Gallbladder is normal. Common bile duct is normal. Pancreas: Pancreas is normal. No ductal dilatation. No pancreatic inflammation. Spleen: Normal spleen Adrenals/urinary tract: Adrenal glands and kidneys are normal. The ureters and bladder normal. Stomach/Bowel: Stomach, small bowel, appendix, and cecum are normal. The colon and rectosigmoid colon are normal. Vascular/Lymphatic: Abdominal aorta is normal caliber. There is no retroperitoneal or periportal lymphadenopathy. No pelvic lymphadenopathy. Reproductive: Prostate normal Other: No free fluid. Musculoskeletal: No aggressive osseous lesion. IMPRESSION: Chest Impression: 1. Mild progression of interstitial thickening in the LEFT perihilar lung most consistent with involving radiation change. 2. No evidence of new or progressive lung cancer in thorax. 3. Stable small LEFT effusion. 4. No adenopathy. Abdomen / Pelvis Impression: 1.  No evidence of metastatic disease in the abdomen pelvis 2.  No skeletal metastasis. Electronically Signed   By: SSuzy BouchardM.D.   On: 12/19/2021 12:19     ASSESSMENT/PLAN:  This is a very pleasant 46year old Caucasian male  diagnosed with stage IV (T2b, N2, M1 B) non-small cell lung cancer,  adenocarcinoma.  He presented with a large central left lower lobe lung mass in addition to left hilar mediastinal lymphadenopathy.  He also has retroperitoneal abdominal lymphadenopathy.  He was diagnosed in May 2022.  His PD-L1 expression is 50%.  His molecular studies by [facility] did not show any evidence of any actionable mutations.  He underwent palliative radiotherapy to the large left lower lobe lung mass under the care of Dr. Lisbeth Renshaw.  He completed this on 06/12/2021.   The patient is currently undergoing systemic chemotherapy with carboplatin for an AUC of 5, Alimta 500 mg per metered squared, and Keytruda 200 mg IV every 3 weeks.  He is status post 9 cycles.  Starting from cycle #5, the patient has been on maintenance Alimta and Keytruda.   The patient recently had a restaging CT scan performed.  Dr. Julien Nordmann personally and independently reviewed the scan and discussed the results with the patient today.  The scan showed no evidence of disease progression.  Recommend that he proceed with cycle #10 today scheduled.  We will see him back for follow-up visit in 3 weeks for evaluation before starting cycle #11.  the patient has stable anemia.  Dr. Julien Nordmann would like to arrange for the patient to have stool cards performed to rule out any GI blood loss..  The patient was advised to call immediately if he has any concerning symptoms in the interval. The patient voices understanding of current disease status and treatment options and is in agreement with the current care plan. All questions were answered. The patient knows to call the clinic with any problems, questions or concerns. We can certainly see the patient much sooner if necessary            Orders Placed This Encounter  Procedures   Occult blood card to lab, stool    Standing Status:   Future    Standing Expiration Date:   12/21/2022   Occult blood card to lab, stool    Standing Status:   Future    Standing Expiration Date:    12/21/2022   Occult blood card to lab, stool    Standing Status:   Future    Standing Expiration Date:   12/21/2022     Tobe Sos Nitya Cauthon, PA-C 12/21/21  ADDENDUM: Hematology/Oncology Attending: I had a face-to-face encounter with the patient today.  I reviewed his record, lab and scan and recommended his care plan.  This is a very pleasant 46 years old white male diagnosed with a stage IV (T2b, N2, M1 B) non-small cell lung cancer, adenocarcinoma with no actionable mutations and PD-L1 expression of 50%. The patient is status post radiotherapy to the left lower lobe lung mass completed in July 2022.  He started systemic chemotherapy with carboplatin, Alimta and Keytruda for 4 cycles and currently on maintenance treatment with Alimta and Keytruda every 3 weeks for a total treatment of 9 cycles. The patient continues to tolerate his treatment well with no concerning adverse effects. He had repeat CT scan of the chest, abdomen pelvis performed recently.  I personally and independently reviewed the scan images and discussed the result and showed the images to the patient today. Has a scan showed a stable disease with no concerning findings for progression.  He continues to have residual radiation effects on the left lung area. I recommended for the patient to continue his current maintenance therapy and he will proceed with  cycle #10 of Alimta and Keytruda today. The patient will come back for follow-up visit in 3 weeks for evaluation before starting cycle #11. The patient was advised to call immediately if she has any other concerning symptoms in the interval.I spent  The total time spent in the appointment was 50 minutes. Disclaimer: This note was dictated with voice recognition software. Similar sounding words can inadvertently be transcribed and may be missed upon review. Eilleen Kempf, MD 12/21/21

## 2021-12-21 ENCOUNTER — Inpatient Hospital Stay: Payer: Managed Care, Other (non HMO) | Admitting: Physician Assistant

## 2021-12-21 ENCOUNTER — Inpatient Hospital Stay: Payer: Managed Care, Other (non HMO) | Attending: Physician Assistant

## 2021-12-21 ENCOUNTER — Other Ambulatory Visit: Payer: Self-pay

## 2021-12-21 ENCOUNTER — Encounter: Payer: Self-pay | Admitting: Physician Assistant

## 2021-12-21 ENCOUNTER — Inpatient Hospital Stay: Payer: Managed Care, Other (non HMO)

## 2021-12-21 VITALS — BP 118/72 | HR 88 | Temp 98.1°F | Resp 17 | Ht 69.0 in | Wt 166.4 lb

## 2021-12-21 DIAGNOSIS — Z5112 Encounter for antineoplastic immunotherapy: Secondary | ICD-10-CM

## 2021-12-21 DIAGNOSIS — C3432 Malignant neoplasm of lower lobe, left bronchus or lung: Secondary | ICD-10-CM | POA: Diagnosis present

## 2021-12-21 DIAGNOSIS — Z5111 Encounter for antineoplastic chemotherapy: Secondary | ICD-10-CM | POA: Insufficient documentation

## 2021-12-21 DIAGNOSIS — Z79899 Other long term (current) drug therapy: Secondary | ICD-10-CM | POA: Insufficient documentation

## 2021-12-21 DIAGNOSIS — D649 Anemia, unspecified: Secondary | ICD-10-CM | POA: Diagnosis not present

## 2021-12-21 DIAGNOSIS — Z95828 Presence of other vascular implants and grafts: Secondary | ICD-10-CM

## 2021-12-21 LAB — CMP (CANCER CENTER ONLY)
ALT: 19 U/L (ref 0–44)
AST: 19 U/L (ref 15–41)
Albumin: 3.5 g/dL (ref 3.5–5.0)
Alkaline Phosphatase: 100 U/L (ref 38–126)
Anion gap: 8 (ref 5–15)
BUN: 11 mg/dL (ref 6–20)
CO2: 28 mmol/L (ref 22–32)
Calcium: 9.4 mg/dL (ref 8.9–10.3)
Chloride: 104 mmol/L (ref 98–111)
Creatinine: 1.05 mg/dL (ref 0.61–1.24)
GFR, Estimated: >60 mL/min (ref >60)
Glucose, Bld: 96 mg/dL (ref 70–99)
Potassium: 4 mmol/L (ref 3.5–5.1)
Sodium: 140 mmol/L (ref 135–145)
Total Bilirubin: 0.2 mg/dL — ABNORMAL LOW (ref 0.3–1.2)
Total Protein: 7.6 g/dL (ref 6.5–8.1)

## 2021-12-21 LAB — CBC WITH DIFFERENTIAL (CANCER CENTER ONLY)
Abs Immature Granulocytes: 0.04 10*3/uL (ref 0.00–0.07)
Basophils Absolute: 0.1 10*3/uL (ref 0.0–0.1)
Basophils Relative: 1 %
Eosinophils Absolute: 0.2 10*3/uL (ref 0.0–0.5)
Eosinophils Relative: 3 %
HCT: 28.2 % — ABNORMAL LOW (ref 39.0–52.0)
Hemoglobin: 9.3 g/dL — ABNORMAL LOW (ref 13.0–17.0)
Immature Granulocytes: 1 %
Lymphocytes Relative: 11 %
Lymphs Abs: 0.7 10*3/uL (ref 0.7–4.0)
MCH: 31 pg (ref 26.0–34.0)
MCHC: 33 g/dL (ref 30.0–36.0)
MCV: 94 fL (ref 80.0–100.0)
Monocytes Absolute: 0.9 10*3/uL (ref 0.1–1.0)
Monocytes Relative: 13 %
Neutro Abs: 4.6 10*3/uL (ref 1.7–7.7)
Neutrophils Relative %: 71 %
Platelet Count: 395 10*3/uL (ref 150–400)
RBC: 3 MIL/uL — ABNORMAL LOW (ref 4.22–5.81)
RDW: 14.5 % (ref 11.5–15.5)
WBC Count: 6.5 10*3/uL (ref 4.0–10.5)
nRBC: 0 % (ref 0.0–0.2)

## 2021-12-21 LAB — TSH: TSH: 3.028 u[IU]/mL (ref 0.320–4.118)

## 2021-12-21 MED ORDER — SODIUM CHLORIDE 0.9 % IV SOLN
200.0000 mg | Freq: Once | INTRAVENOUS | Status: AC
  Administered 2021-12-21: 200 mg via INTRAVENOUS
  Filled 2021-12-21: qty 200

## 2021-12-21 MED ORDER — CYANOCOBALAMIN 1000 MCG/ML IJ SOLN: 1000.0000 ug | Freq: Once | INTRAMUSCULAR | Status: DC

## 2021-12-21 MED ORDER — HEPARIN SOD (PORK) LOCK FLUSH 100 UNIT/ML IV SOLN
500.0000 [IU] | Freq: Once | INTRAVENOUS | Status: AC | PRN
  Administered 2021-12-21: 500 [IU]

## 2021-12-21 MED ORDER — SODIUM CHLORIDE 0.9% FLUSH
10.0000 mL | Freq: Once | INTRAVENOUS | Status: AC
  Administered 2021-12-21: 10 mL

## 2021-12-21 MED ORDER — SODIUM CHLORIDE 0.9% FLUSH: 10.0000 mL | INTRAVENOUS | Status: DC | PRN

## 2021-12-21 MED ORDER — SODIUM CHLORIDE 0.9 % IV SOLN
500.0000 mg/m2 | Freq: Once | INTRAVENOUS | Status: AC
  Administered 2021-12-21: 1000 mg via INTRAVENOUS
  Filled 2021-12-21: qty 40

## 2021-12-21 MED ORDER — SODIUM CHLORIDE 0.9 % IV SOLN: Freq: Once | INTRAVENOUS | Status: AC

## 2021-12-21 MED ORDER — PROCHLORPERAZINE MALEATE 10 MG PO TABS
10.0000 mg | ORAL_TABLET | Freq: Once | ORAL | Status: AC
  Administered 2021-12-21: 10 mg via ORAL
  Filled 2021-12-21: qty 1

## 2021-12-21 NOTE — Patient Instructions (Signed)
[facility]  Discharge Instructions: Thank you for choosing [facility] to provide your oncology and hematology care.   If you have a lab appointment with the Cancer Center, please go directly to the Cancer Center and check in at the registration area.   Wear comfortable clothing and clothing appropriate for easy access to any Portacath or PICC line.   We strive to give you quality time with your provider. You may need to reschedule your appointment if you arrive late (15 or more minutes).  Arriving late affects you and other patients whose appointments are after yours.  Also, if you miss three or more appointments without notifying the office, you may be dismissed from the clinic at the provider's discretion.      For prescription refill requests, have your pharmacy contact our office and allow 72 hours for refills to be completed.    Today you received the following chemotherapy and/or immunotherapy agents: Keytruda/Alimta.      To help prevent nausea and vomiting after your treatment, we encourage you to take your nausea medication as directed.  BELOW ARE SYMPTOMS THAT SHOULD BE REPORTED IMMEDIATELY: *FEVER GREATER THAN 100.4 F (38 C) OR HIGHER *CHILLS OR SWEATING *NAUSEA AND VOMITING THAT IS NOT CONTROLLED WITH YOUR NAUSEA MEDICATION *UNUSUAL SHORTNESS OF BREATH *UNUSUAL BRUISING OR BLEEDING *URINARY PROBLEMS (pain or burning when urinating, or frequent urination) *BOWEL PROBLEMS (unusual diarrhea, constipation, pain near the anus) TENDERNESS IN MOUTH AND THROAT WITH OR WITHOUT PRESENCE OF ULCERS (sore throat, sores in mouth, or a toothache) UNUSUAL RASH, SWELLING OR PAIN  UNUSUAL VAGINAL DISCHARGE OR ITCHING   Items with * indicate a potential emergency and should be followed up as soon as possible or go to the Emergency Department if any problems should occur.  Please show the CHEMOTHERAPY ALERT CARD or IMMUNOTHERAPY ALERT CARD at  check-in to the Emergency Department and triage nurse.  Should you have questions after your visit or need to cancel or reschedule your appointment, please contact [facility]  Dept: [phone]  and follow the prompts.  Office hours are 8:00 a.m. to 4:30 p.m. Monday - Friday. Please note that voicemails left after 4:00 p.m. may not be returned until the following business day.  We are closed weekends and major holidays. You have access to a nurse at all times for urgent questions. Please call the main number to the clinic Dept: [phone] and follow the prompts.   For any non-urgent questions, you may also contact your provider using MyChart. We now offer e-Visits for anyone 18 and older to request care online for non-urgent symptoms. For details visit [email or web address].   Also download the MyChart app! Go to the app store, search "MyChart", open the app, select , and log in with your MyChart username and password.  Due to Covid, a mask is required upon entering the hospital/clinic. If you do not have a mask, one will be given to you upon arrival. For doctor visits, patients may have 1 support person aged 18 or older with them. For treatment visits, patients cannot have anyone with them due to current Covid guidelines and our immunocompromised population.   

## 2022-01-11 ENCOUNTER — Inpatient Hospital Stay: Payer: Managed Care, Other (non HMO) | Admitting: Internal Medicine

## 2022-01-11 ENCOUNTER — Inpatient Hospital Stay: Payer: Managed Care, Other (non HMO) | Attending: Physician Assistant

## 2022-01-11 ENCOUNTER — Inpatient Hospital Stay: Payer: Managed Care, Other (non HMO)

## 2022-01-11 ENCOUNTER — Other Ambulatory Visit: Payer: Self-pay

## 2022-01-11 VITALS — BP 109/91 | HR 87 | Temp 98.0°F | Resp 18 | Ht 69.0 in | Wt 167.9 lb

## 2022-01-11 DIAGNOSIS — C3432 Malignant neoplasm of lower lobe, left bronchus or lung: Secondary | ICD-10-CM | POA: Insufficient documentation

## 2022-01-11 DIAGNOSIS — Z5111 Encounter for antineoplastic chemotherapy: Secondary | ICD-10-CM | POA: Insufficient documentation

## 2022-01-11 DIAGNOSIS — Z5112 Encounter for antineoplastic immunotherapy: Secondary | ICD-10-CM | POA: Insufficient documentation

## 2022-01-11 DIAGNOSIS — Z79899 Other long term (current) drug therapy: Secondary | ICD-10-CM | POA: Insufficient documentation

## 2022-01-11 DIAGNOSIS — D649 Anemia, unspecified: Secondary | ICD-10-CM

## 2022-01-11 DIAGNOSIS — Z95828 Presence of other vascular implants and grafts: Secondary | ICD-10-CM

## 2022-01-11 LAB — CBC WITH DIFFERENTIAL (CANCER CENTER ONLY)
Abs Immature Granulocytes: 0.05 10*3/uL (ref 0.00–0.07)
Basophils Absolute: 0.1 10*3/uL (ref 0.0–0.1)
Basophils Relative: 1 %
Eosinophils Absolute: 0.3 10*3/uL (ref 0.0–0.5)
Eosinophils Relative: 4 %
HCT: 28.4 % — ABNORMAL LOW (ref 39.0–52.0)
Hemoglobin: 8.9 g/dL — ABNORMAL LOW (ref 13.0–17.0)
Immature Granulocytes: 1 %
Lymphocytes Relative: 12 %
Lymphs Abs: 0.7 10*3/uL (ref 0.7–4.0)
MCH: 30 pg (ref 26.0–34.0)
MCHC: 31.3 g/dL (ref 30.0–36.0)
MCV: 95.6 fL (ref 80.0–100.0)
Monocytes Absolute: 0.9 10*3/uL (ref 0.1–1.0)
Monocytes Relative: 14 %
Neutro Abs: 4 10*3/uL (ref 1.7–7.7)
Neutrophils Relative %: 68 %
Platelet Count: 391 10*3/uL (ref 150–400)
RBC: 2.97 MIL/uL — ABNORMAL LOW (ref 4.22–5.81)
RDW: 16 % — ABNORMAL HIGH (ref 11.5–15.5)
WBC Count: 6 10*3/uL (ref 4.0–10.5)
nRBC: 0 % (ref 0.0–0.2)

## 2022-01-11 LAB — CMP (CANCER CENTER ONLY)
ALT: 23 U/L (ref 0–44)
AST: 19 U/L (ref 15–41)
Albumin: 3.5 g/dL (ref 3.5–5.0)
Alkaline Phosphatase: 105 U/L (ref 38–126)
Anion gap: 6 (ref 5–15)
BUN: 10 mg/dL (ref 6–20)
CO2: 29 mmol/L (ref 22–32)
Calcium: 9 mg/dL (ref 8.9–10.3)
Chloride: 106 mmol/L (ref 98–111)
Creatinine: 1.21 mg/dL (ref 0.61–1.24)
GFR, Estimated: >60 mL/min (ref >60)
Glucose, Bld: 101 mg/dL — ABNORMAL HIGH (ref 70–99)
Potassium: 3.8 mmol/L (ref 3.5–5.1)
Sodium: 141 mmol/L (ref 135–145)
Total Bilirubin: 0.2 mg/dL — ABNORMAL LOW (ref 0.3–1.2)
Total Protein: 7.2 g/dL (ref 6.5–8.1)

## 2022-01-11 LAB — TSH: TSH: 3.562 u[IU]/mL (ref 0.320–4.118)

## 2022-01-11 MED ORDER — SODIUM CHLORIDE 0.9% FLUSH
10.0000 mL | Freq: Once | INTRAVENOUS | Status: AC
  Administered 2022-01-11: 10 mL

## 2022-01-11 MED ORDER — PROCHLORPERAZINE MALEATE 10 MG PO TABS
10.0000 mg | ORAL_TABLET | Freq: Once | ORAL | Status: AC
  Administered 2022-01-11: 10 mg via ORAL
  Filled 2022-01-11: qty 1

## 2022-01-11 MED ORDER — SODIUM CHLORIDE 0.9 % IV SOLN: Freq: Once | INTRAVENOUS | Status: AC

## 2022-01-11 MED ORDER — SODIUM CHLORIDE 0.9 % IV SOLN
200.0000 mg | Freq: Once | INTRAVENOUS | Status: AC
  Administered 2022-01-11: 200 mg via INTRAVENOUS
  Filled 2022-01-11: qty 200

## 2022-01-11 MED ORDER — HEPARIN SOD (PORK) LOCK FLUSH 100 UNIT/ML IV SOLN
500.0000 [IU] | Freq: Once | INTRAVENOUS | Status: AC | PRN
  Administered 2022-01-11: 500 [IU]

## 2022-01-11 MED ORDER — SODIUM CHLORIDE 0.9 % IV SOLN
500.0000 mg/m2 | Freq: Once | INTRAVENOUS | Status: AC
  Administered 2022-01-11: 1000 mg via INTRAVENOUS
  Filled 2022-01-11: qty 40

## 2022-01-11 MED ORDER — SODIUM CHLORIDE 0.9% FLUSH
10.0000 mL | INTRAVENOUS | Status: DC | PRN
  Administered 2022-01-11: 10 mL

## 2022-01-11 NOTE — Patient Instructions (Signed)
[facility]  Discharge Instructions: Thank you for choosing [facility] to provide your oncology and hematology care.   If you have a lab appointment with the Cancer Center, please go directly to the Cancer Center and check in at the registration area.   Wear comfortable clothing and clothing appropriate for easy access to any Portacath or PICC line.   We strive to give you quality time with your provider. You may need to reschedule your appointment if you arrive late (15 or more minutes).  Arriving late affects you and other patients whose appointments are after yours.  Also, if you miss three or more appointments without notifying the office, you may be dismissed from the clinic at the provider's discretion.      For prescription refill requests, have your pharmacy contact our office and allow 72 hours for refills to be completed.    Today you received the following chemotherapy and/or immunotherapy agents: Keytruda/Alimta.      To help prevent nausea and vomiting after your treatment, we encourage you to take your nausea medication as directed.  BELOW ARE SYMPTOMS THAT SHOULD BE REPORTED IMMEDIATELY: *FEVER GREATER THAN 100.4 F (38 C) OR HIGHER *CHILLS OR SWEATING *NAUSEA AND VOMITING THAT IS NOT CONTROLLED WITH YOUR NAUSEA MEDICATION *UNUSUAL SHORTNESS OF BREATH *UNUSUAL BRUISING OR BLEEDING *URINARY PROBLEMS (pain or burning when urinating, or frequent urination) *BOWEL PROBLEMS (unusual diarrhea, constipation, pain near the anus) TENDERNESS IN MOUTH AND THROAT WITH OR WITHOUT PRESENCE OF ULCERS (sore throat, sores in mouth, or a toothache) UNUSUAL RASH, SWELLING OR PAIN  UNUSUAL VAGINAL DISCHARGE OR ITCHING   Items with * indicate a potential emergency and should be followed up as soon as possible or go to the Emergency Department if any problems should occur.  Please show the CHEMOTHERAPY ALERT CARD or IMMUNOTHERAPY ALERT CARD at  check-in to the Emergency Department and triage nurse.  Should you have questions after your visit or need to cancel or reschedule your appointment, please contact  [facility]  Dept: [phone]  and follow the prompts.  Office hours are 8:00 a.m. to 4:30 p.m. Monday - Friday. Please note that voicemails left after 4:00 p.m. may not be returned until the following business day.  We are closed weekends and major holidays. You have access to a nurse at all times for urgent questions. Please call the main number to the clinic Dept: [phone] and follow the prompts.   For any non-urgent questions, you may also contact your provider using MyChart. We now offer e-Visits for anyone 18 and older to request care online for non-urgent symptoms. For details visit [email or web address].   Also download the MyChart app! Go to the app store, search "MyChart", open the app, select , and log in with your MyChart username and password.  Due to Covid, a mask is required upon entering the hospital/clinic. If you do not have a mask, one will be given to you upon arrival. For doctor visits, patients may have 1 support person aged 18 or older with them. For treatment visits, patients cannot have anyone with them due to current Covid guidelines and our immunocompromised population.   

## 2022-01-11 NOTE — Progress Notes (Signed)
[facility] [phone]   [phone]  OFFICE PROGRESS NOTE  [name], MD [place] [address]  DIAGNOSIS:  Stage IV (T2b, N2, M1 B) non-small cell lung cancer, adenocarcinoma presented with large central left lower lobe lung mass with left hilar and mediastinal lymphadenopathy as well as abdominal retroperitoneal lymphadenopathy diagnosed in May 2022.  The patient also has bilateral parotid gland nodules that need close monitoring.  [facility] MOLECULAR STUDY: Results with Therapy Associations BIOMARKER METHOD ANALYTE RESULT THERAPY ASSOCIATION BIOMARKER LEVEL* .PD-L1 (22c3) IHC Protein Positive, TPS: 50% BENEFIT cemiplimab, pembrolizumab Level 1 .PD-L1 (28-8) IHC Protein Positive   1+, 50% BENEFIT nivolumab/ipilimumab combination Level 1 .TMB Seq DNA-Tumor High, 18 mut/Mb BENEFIT pembrolizumab Level 2 . alectinib, ceritinib, crizotinib, lorlatinib Level 1 . IHC Protein Negative   0 brigatinib Level 2 . ALK Seq RNA-Tumor Fusion Not Detected LACK OF BENEFIT alectinib, brigatinib, ceritinib, crizotinib, lorlatinib Level 2 .BRAF Seq DNA-Tumor Mutation Not Detected LACK OF BENEFIT dabrafenib and trametinib combination therapy, vemurafenib Level 2 .EGFR Seq DNA-Tumor Mutation Not Detected LACK OF BENEFIT erlotinib, gefitinib Level 2 .KRAS Seq DNA-Tumor Mutation Not Detected LACK OF BENEFIT sotorasib Level 2 .RET Seq RNA-Tumor Fusion Not Detected LACK OF BENEFIT pralsetinib, selpercatinib Level 2 .ROS1 Seq RNA-Tumor Fusion Not Detected LACK OF BENEFIT ceritinib, crizotinib, entrectinib, lorlatinib Level 2 . CNA-Seq DNA-Tumor Amplification Not Detected .MET Seq RNA-Tumor Variant Transcript Not Detected LACK OF BENEFIT crizotinib Level 3  PRIOR THERAPY: Palliative radiotherapy to the large central left lower lobe lung mass under the care of Dr. Lisbeth Renshaw.  CURRENT THERAPY: Systemic chemotherapy with carboplatin for AUC  of 5, Alimta 500 Mg/M2 and possibly Keytruda 200 Mg IV every 3 weeks.  First dose expected June 14, 2021.  Starting from cycle #5 the patient is on maintenance treatment with Alimta and Keytruda every 3 weeks.  Status post 10 cycles of treatment.  INTERVAL HISTORY: Cameron Harper 46 y.o. male returns to the clinic today for follow-up visit.  The patient is feeling fine today with no concerning complaints.  He denied having any current chest pain, shortness of breath, cough or hemoptysis.  He denied having any fever or chills.  He has no nausea, vomiting, diarrhea or constipation.  He has no headache or visual changes.  He denied having any significant weight loss or night sweats.  He continues to tolerate his maintenance treatment with Alimta and Keytruda fairly well.  MEDICAL HISTORY: Past Medical History:  Diagnosis Date   Chronic back pain    Eczema    GERD (gastroesophageal reflux disease)    Lung cancer (HCC)     ALLERGIES:  is allergic to penicillins.  MEDICATIONS:  Current Outpatient Medications  Medication Sig Dispense Refill   acetaminophen (TYLENOL) 500 MG tablet Take 500-1,000 mg by mouth every 6 (six) hours as needed (for back pain.). (Patient not taking: Reported on 10/93/2355)     folic acid (FOLVITE) 1 MG tablet Take 1 tablet (1 mg total) by mouth daily. 30 tablet 4   lidocaine-prilocaine (EMLA) cream Apply 1 application topically as needed. 30 g 2   magic mouthwash SOLN Take 5 mLs by mouth 3 (three) times daily as needed for mouth pain. (Patient not taking: Reported on 09/27/2021) 240 mL 0   ondansetron (ZOFRAN-ODT) 4 MG disintegrating tablet Take 1 tablet (4 mg total) by mouth every 8 (eight) hours as needed for nausea or vomiting. 30 tablet 2   prochlorperazine (COMPAZINE) 10  MG tablet Take 1 tablet (10 mg total) by mouth every 6 (six) hours as needed for nausea or vomiting. 30 tablet 0   traMADol (ULTRAM) 50 MG tablet Take 50 mg by mouth every 6 (six) hours as needed  for severe pain. (Patient not taking: Reported on 09/27/2021)     zolpidem (AMBIEN) 10 MG tablet Take 5-10 mg by mouth at bedtime as needed for sleep. (Patient not taking: Reported on 09/27/2021)     No current facility-administered medications for this visit.    SURGICAL HISTORY:  Past Surgical History:  Procedure Laterality Date   BRONCHIAL BRUSHINGS  05/12/2021   Procedure: BRONCHIAL BRUSHINGS;  Surgeon: Garner Nash, DO;  Location: [facility];  Service: Pulmonary;;   BRONCHIAL DILITATION  05/12/2021   Procedure: BRONCHIAL DILITATION;  Surgeon: Garner Nash, DO;  Location: [facility];  Service: Pulmonary;;   BRONCHIAL NEEDLE ASPIRATION BIOPSY  05/12/2021   Procedure: BRONCHIAL NEEDLE ASPIRATION BIOPSIES;  Surgeon: Garner Nash, DO;  Location: [facility];  Service: Pulmonary;;   BRONCHIAL WASHINGS  05/12/2021   Procedure: BRONCHIAL WASHINGS;  Surgeon: Garner Nash, DO;  Location: [facility];  Service: Pulmonary;;   CRYOTHERAPY  05/12/2021   Procedure: CRYOTHERAPY;  Surgeon: Garner Nash, DO;  Location: [facility];  Service: Pulmonary;;   HEMOSTASIS CONTROL  05/12/2021   Procedure: HEMOSTASIS CONTROL;  Surgeon: Garner Nash, DO;  Location: [facility];  Service: Pulmonary;;  epi injection   IR IMAGING GUIDED PORT INSERTION  06/29/2021   Spine injection     Pain control   VIDEO BRONCHOSCOPY WITH ENDOBRONCHIAL ULTRASOUND N/A 05/12/2021   Procedure: VIDEO BRONCHOSCOPY WITH ENDOBRONCHIAL ULTRASOUND;  Surgeon: Garner Nash, DO;  Location: [facility];  Service: Pulmonary;  Laterality: N/A;   WISDOM TOOTH EXTRACTION     no anesthesia involed    REVIEW OF SYSTEMS:  A comprehensive review of systems was negative.   PHYSICAL EXAMINATION: General appearance: alert, cooperative, and no distress Head: Normocephalic, without obvious abnormality, atraumatic Neck: no adenopathy, no JVD, supple, symmetrical, trachea midline, and thyroid not enlarged, symmetric, no  tenderness/mass/nodules Lymph nodes: Cervical, supraclavicular, and axillary nodes normal. Resp: clear to auscultation bilaterally Back: symmetric, no curvature. ROM normal. No CVA tenderness. Cardio: regular rate and rhythm, S1, S2 normal, no murmur, click, rub or gallop GI: soft, non-tender; bowel sounds normal; no masses,  no organomegaly Extremities: extremities normal, atraumatic, no cyanosis or edema  ECOG PERFORMANCE STATUS: 1 - Symptomatic but completely ambulatory  Blood pressure (!) 109/91, pulse 87, temperature 98 F (36.7 C), temperature source Axillary, resp. rate 18, height $RemoveBe'5\' 9"'HYITxuxHb$  (1.753 m), weight 167 lb 14.4 oz (76.2 kg), SpO2 99 %.  LABORATORY DATA: Lab Results  Component Value Date   WBC 6.5 12/21/2021   HGB 9.3 (L) 12/21/2021   HCT 28.2 (L) 12/21/2021   MCV 94.0 12/21/2021   PLT 395 12/21/2021      Chemistry      Component Value Date/Time   NA 140 12/21/2021 0952   K 4.0 12/21/2021 0952   CL 104 12/21/2021 0952   CO2 28 12/21/2021 0952   BUN 11 12/21/2021 0952   CREATININE 1.05 12/21/2021 0952      Component Value Date/Time   CALCIUM 9.4 12/21/2021 0952   ALKPHOS 100 12/21/2021 0952   AST 19 12/21/2021 0952   ALT 19 12/21/2021 0952   BILITOT 0.2 (L) 12/21/2021 0952       RADIOGRAPHIC STUDIES: CT Chest W Contrast  Result Date:  12/19/2021 CLINICAL DATA:  Primary Cancer Type: Lung Imaging Indication: Assess response to therapy Interval therapy since last imaging? Yes Initial Cancer Diagnosis Date: 05/12/2021; Established by: Biopsy-proven Detailed Pathology: Stage IV non-small cell lung cancer, adenocarcinoma. Primary Tumor location:  Left hilum and central left lower lobe. Surgeries: No. Chemotherapy: Yes; Ongoing? Yes; Most recent administration: 11/30/2021 Immunotherapy?  Yes; Type: Keytruda; Ongoing? Yes Radiation therapy? Yes; Date Range: 05/22/2021 - 06/12/2021; Target: Left lung EXAM: CT CHEST, ABDOMEN, AND PELVIS WITH CONTRAST TECHNIQUE:  Multidetector CT imaging of the chest, abdomen and pelvis was performed following the standard protocol during bolus administration of intravenous contrast. RADIATION DOSE REDUCTION: This exam was performed according to the departmental dose-optimization program which includes automated exposure control, adjustment of the mA and/or kV according to patient size and/or use of iterative reconstruction technique. CONTRAST:  151mL OMNIPAQUE IOHEXOL 300 MG/ML  SOLN COMPARISON:  Most recent CT chest, abdomen and pelvis 10/16/2021. 05/02/2021 PET-CT. FINDINGS: CT CHEST FINDINGS Cardiovascular: Port in the anterior chest wall with tip in distal SVC. No significant vascular findings. Normal heart size. No pericardial effusion. Mediastinum/Nodes: No axillary or supraclavicular adenopathy. No mediastinal or hilar adenopathy. No pericardial fluid. Esophagus normal. Lungs/Pleura: Perihilar interstitial thickening in the LEFT lung suggest radiation change. The interstitial thickening is slightly increased from comparison exam. No discrete measurable nodularity. Chronic small LEFT effusion. RIGHT lung is clear. Musculoskeletal: No aggressive osseous lesion. CT ABDOMEN AND PELVIS FINDINGS Hepatobiliary: No focal hepatic lesion. No biliary ductal dilatation. Gallbladder is normal. Common bile duct is normal. Pancreas: Pancreas is normal. No ductal dilatation. No pancreatic inflammation. Spleen: Normal spleen Adrenals/urinary tract: Adrenal glands and kidneys are normal. The ureters and bladder normal. Stomach/Bowel: Stomach, small bowel, appendix, and cecum are normal. The colon and rectosigmoid colon are normal. Vascular/Lymphatic: Abdominal aorta is normal caliber. There is no retroperitoneal or periportal lymphadenopathy. No pelvic lymphadenopathy. Reproductive: Prostate normal Other: No free fluid. Musculoskeletal: No aggressive osseous lesion. IMPRESSION: Chest Impression: 1. Mild progression of interstitial thickening in the  LEFT perihilar lung most consistent with involving radiation change. 2. No evidence of new or progressive lung cancer in thorax. 3. Stable small LEFT effusion. 4. No adenopathy. Abdomen / Pelvis Impression: 1.  No evidence of metastatic disease in the abdomen pelvis 2.  No skeletal metastasis. Electronically Signed   By: Suzy Bouchard M.D.   On: 12/19/2021 12:19   CT Abdomen Pelvis W Contrast  Result Date: 12/19/2021 CLINICAL DATA:  Primary Cancer Type: Lung Imaging Indication: Assess response to therapy Interval therapy since last imaging? Yes Initial Cancer Diagnosis Date: 05/12/2021; Established by: Biopsy-proven Detailed Pathology: Stage IV non-small cell lung cancer, adenocarcinoma. Primary Tumor location:  Left hilum and central left lower lobe. Surgeries: No. Chemotherapy: Yes; Ongoing? Yes; Most recent administration: 11/30/2021 Immunotherapy?  Yes; Type: Keytruda; Ongoing? Yes Radiation therapy? Yes; Date Range: 05/22/2021 - 06/12/2021; Target: Left lung EXAM: CT CHEST, ABDOMEN, AND PELVIS WITH CONTRAST TECHNIQUE: Multidetector CT imaging of the chest, abdomen and pelvis was performed following the standard protocol during bolus administration of intravenous contrast. RADIATION DOSE REDUCTION: This exam was performed according to the departmental dose-optimization program which includes automated exposure control, adjustment of the mA and/or kV according to patient size and/or use of iterative reconstruction technique. CONTRAST:  151mL OMNIPAQUE IOHEXOL 300 MG/ML  SOLN COMPARISON:  Most recent CT chest, abdomen and pelvis 10/16/2021. 05/02/2021 PET-CT. FINDINGS: CT CHEST FINDINGS Cardiovascular: Port in the anterior chest wall with tip in distal SVC. No significant vascular findings. Normal heart  size. No pericardial effusion. Mediastinum/Nodes: No axillary or supraclavicular adenopathy. No mediastinal or hilar adenopathy. No pericardial fluid. Esophagus normal. Lungs/Pleura: Perihilar interstitial  thickening in the LEFT lung suggest radiation change. The interstitial thickening is slightly increased from comparison exam. No discrete measurable nodularity. Chronic small LEFT effusion. RIGHT lung is clear. Musculoskeletal: No aggressive osseous lesion. CT ABDOMEN AND PELVIS FINDINGS Hepatobiliary: No focal hepatic lesion. No biliary ductal dilatation. Gallbladder is normal. Common bile duct is normal. Pancreas: Pancreas is normal. No ductal dilatation. No pancreatic inflammation. Spleen: Normal spleen Adrenals/urinary tract: Adrenal glands and kidneys are normal. The ureters and bladder normal. Stomach/Bowel: Stomach, small bowel, appendix, and cecum are normal. The colon and rectosigmoid colon are normal. Vascular/Lymphatic: Abdominal aorta is normal caliber. There is no retroperitoneal or periportal lymphadenopathy. No pelvic lymphadenopathy. Reproductive: Prostate normal Other: No free fluid. Musculoskeletal: No aggressive osseous lesion. IMPRESSION: Chest Impression: 1. Mild progression of interstitial thickening in the LEFT perihilar lung most consistent with involving radiation change. 2. No evidence of new or progressive lung cancer in thorax. 3. Stable small LEFT effusion. 4. No adenopathy. Abdomen / Pelvis Impression: 1.  No evidence of metastatic disease in the abdomen pelvis 2.  No skeletal metastasis. Electronically Signed   By: Suzy Bouchard M.D.   On: 12/19/2021 12:19    ASSESSMENT AND PLAN: This is a very pleasant 46 years old white male recently diagnosed with a stage IV (T2b, N2, M1 B) non-small cell lung cancer, adenocarcinoma presented with large central left lower lobe lung mass in addition to left hilar and mediastinal lymphadenopathy as well as retroperitoneal abdominal lymph node diagnosed in May 2022.  The patient is currently undergoing palliative radiotherapy to the large left lower lobe lung mass under the care of of Dr. Lisbeth Renshaw.  He is expected to complete this course of treatment  on June 12, 2021. MRI of the brain showed no evidence of metastatic disease to the brain. His molecular studies by [facility] showed no actionable mutations and PD-L1 expression of 50%. He is currently undergoing systemic chemotherapy with carboplatin for AUC of 5, Alimta 500 Mg/M2 and Keytruda 200 Mg IV every 3 weeks.  Starting from cycle #5 he is on maintenance treatment with Alimta and Keytruda every 3 weeks status post 10 cycles. He has been tolerating this treatment well with no concerning adverse effects I recommended for the patient to proceed with cycle #11 today as planned. For the persistent anemia, I recommended for the patient to start taking oral iron tablet once daily with vitamin C or orange juice.  I will continue to monitor closely for now. He will come back for follow-up visit in 3 weeks for evaluation before the next cycle of his treatment. The patient was advised to call immediately if he has any other concerning symptoms in the interval. The patient voices understanding of current disease status and treatment options and is in agreement with the current care plan. All questions were answered. The patient knows to call the clinic with any problems, questions or concerns. We can certainly see the patient much sooner if necessary.  Disclaimer: This note was dictated with voice recognition software. Similar sounding words can inadvertently be transcribed and may not be corrected upon review.

## 2022-01-17 ENCOUNTER — Telehealth: Payer: Self-pay

## 2022-01-17 NOTE — Telephone Encounter (Signed)
Pt called stating he has a rash in his groin and thigh area and his Eucerin cream is not helping it.  Discussed with Cassandra, PA-C who advising pt to use hydrocortisone cream and to help with itching, he can take benadryl. Pt expressed understanding of this information.  While speaking to pt, he states he had a instance of chest pressure this morning that went away after taking some Tylenol. Pt states it occurred after coughing. Pt was advised to take Robitussin to help with his cough if he is coughing. Pt expressed understanding of this information.   Pt was further advised if he starts to experience nausea/vomiting (not his not his norm), lightheadedness, chest pain, numbness and tingling in his arms/hands, to go to the ER. This was reviewed with the pt twice and he expressed understanding of this information.

## 2022-01-31 ENCOUNTER — Telehealth: Payer: Self-pay

## 2022-01-31 NOTE — Telephone Encounter (Signed)
Patient notified of completion of FMLA extension forms. Fax transmission confirmation received. Copy of forms mailed to Patient as requested. No other needs voiced at this time ?

## 2022-02-01 ENCOUNTER — Other Ambulatory Visit: Payer: Self-pay

## 2022-02-01 ENCOUNTER — Inpatient Hospital Stay: Payer: Managed Care, Other (non HMO)

## 2022-02-01 ENCOUNTER — Inpatient Hospital Stay: Payer: Managed Care, Other (non HMO) | Admitting: Internal Medicine

## 2022-02-01 ENCOUNTER — Inpatient Hospital Stay: Payer: Managed Care, Other (non HMO) | Attending: Physician Assistant

## 2022-02-01 VITALS — BP 134/79 | HR 91 | Temp 97.5°F | Resp 19 | Ht 69.0 in | Wt 167.1 lb

## 2022-02-01 DIAGNOSIS — Z95828 Presence of other vascular implants and grafts: Secondary | ICD-10-CM

## 2022-02-01 DIAGNOSIS — Z5111 Encounter for antineoplastic chemotherapy: Secondary | ICD-10-CM | POA: Insufficient documentation

## 2022-02-01 DIAGNOSIS — T451X5A Adverse effect of antineoplastic and immunosuppressive drugs, initial encounter: Secondary | ICD-10-CM | POA: Diagnosis not present

## 2022-02-01 DIAGNOSIS — C3490 Malignant neoplasm of unspecified part of unspecified bronchus or lung: Secondary | ICD-10-CM | POA: Diagnosis not present

## 2022-02-01 DIAGNOSIS — C3432 Malignant neoplasm of lower lobe, left bronchus or lung: Secondary | ICD-10-CM | POA: Diagnosis present

## 2022-02-01 DIAGNOSIS — D6481 Anemia due to antineoplastic chemotherapy: Secondary | ICD-10-CM | POA: Insufficient documentation

## 2022-02-01 DIAGNOSIS — Z5112 Encounter for antineoplastic immunotherapy: Secondary | ICD-10-CM | POA: Diagnosis not present

## 2022-02-01 DIAGNOSIS — Z79899 Other long term (current) drug therapy: Secondary | ICD-10-CM | POA: Insufficient documentation

## 2022-02-01 LAB — CBC WITH DIFFERENTIAL (CANCER CENTER ONLY)
Abs Immature Granulocytes: 0.06 10*3/uL (ref 0.00–0.07)
Basophils Absolute: 0.1 10*3/uL (ref 0.0–0.1)
Basophils Relative: 1 %
Eosinophils Absolute: 0.1 10*3/uL (ref 0.0–0.5)
Eosinophils Relative: 2 %
HCT: 28.1 % — ABNORMAL LOW (ref 39.0–52.0)
Hemoglobin: 8.8 g/dL — ABNORMAL LOW (ref 13.0–17.0)
Immature Granulocytes: 1 %
Lymphocytes Relative: 11 %
Lymphs Abs: 0.8 10*3/uL (ref 0.7–4.0)
MCH: 30.4 pg (ref 26.0–34.0)
MCHC: 31.3 g/dL (ref 30.0–36.0)
MCV: 97.2 fL (ref 80.0–100.0)
Monocytes Absolute: 0.9 10*3/uL (ref 0.1–1.0)
Monocytes Relative: 12 %
Neutro Abs: 5.5 10*3/uL (ref 1.7–7.7)
Neutrophils Relative %: 73 %
Platelet Count: 437 10*3/uL — ABNORMAL HIGH (ref 150–400)
RBC: 2.89 MIL/uL — ABNORMAL LOW (ref 4.22–5.81)
RDW: 17 % — ABNORMAL HIGH (ref 11.5–15.5)
WBC Count: 7.4 10*3/uL (ref 4.0–10.5)
nRBC: 0 % (ref 0.0–0.2)

## 2022-02-01 LAB — CMP (CANCER CENTER ONLY)
ALT: 22 U/L (ref 0–44)
AST: 18 U/L (ref 15–41)
Albumin: 3.5 g/dL (ref 3.5–5.0)
Alkaline Phosphatase: 112 U/L (ref 38–126)
Anion gap: 6 (ref 5–15)
BUN: 13 mg/dL (ref 6–20)
CO2: 30 mmol/L (ref 22–32)
Calcium: 9.5 mg/dL (ref 8.9–10.3)
Chloride: 105 mmol/L (ref 98–111)
Creatinine: 1.28 mg/dL — ABNORMAL HIGH (ref 0.61–1.24)
GFR, Estimated: >60 mL/min (ref >60)
Glucose, Bld: 102 mg/dL — ABNORMAL HIGH (ref 70–99)
Potassium: 4.2 mmol/L (ref 3.5–5.1)
Sodium: 141 mmol/L (ref 135–145)
Total Bilirubin: 0.3 mg/dL (ref 0.3–1.2)
Total Protein: 7.6 g/dL (ref 6.5–8.1)

## 2022-02-01 LAB — TSH: TSH: 2.118 u[IU]/mL (ref 0.320–4.118)

## 2022-02-01 MED ORDER — SODIUM CHLORIDE 0.9% FLUSH
10.0000 mL | Freq: Once | INTRAVENOUS | Status: AC
  Administered 2022-02-01: 10 mL

## 2022-02-01 MED ORDER — SODIUM CHLORIDE 0.9 % IV SOLN
200.0000 mg | Freq: Once | INTRAVENOUS | Status: AC
  Administered 2022-02-01: 200 mg via INTRAVENOUS
  Filled 2022-02-01: qty 200

## 2022-02-01 MED ORDER — CYANOCOBALAMIN 1000 MCG/ML IJ SOLN
1000.0000 ug | Freq: Once | INTRAMUSCULAR | Status: AC
  Administered 2022-02-01: 1000 ug via INTRAMUSCULAR
  Filled 2022-02-01: qty 1

## 2022-02-01 MED ORDER — HEPARIN SOD (PORK) LOCK FLUSH 100 UNIT/ML IV SOLN
500.0000 [IU] | Freq: Once | INTRAVENOUS | Status: AC | PRN
  Administered 2022-02-01: 500 [IU]

## 2022-02-01 MED ORDER — SODIUM CHLORIDE 0.9 % IV SOLN: Freq: Once | INTRAVENOUS | Status: AC

## 2022-02-01 MED ORDER — PROCHLORPERAZINE MALEATE 10 MG PO TABS
10.0000 mg | ORAL_TABLET | Freq: Once | ORAL | Status: AC
  Administered 2022-02-01: 10 mg via ORAL
  Filled 2022-02-01: qty 1

## 2022-02-01 MED ORDER — SODIUM CHLORIDE 0.9 % IV SOLN
400.0000 mg/m2 | Freq: Once | INTRAVENOUS | Status: AC
  Administered 2022-02-01: 800 mg via INTRAVENOUS
  Filled 2022-02-01: qty 20

## 2022-02-01 MED ORDER — SODIUM CHLORIDE 0.9% FLUSH
10.0000 mL | INTRAVENOUS | Status: DC | PRN
  Administered 2022-02-01: 10 mL

## 2022-02-01 NOTE — Progress Notes (Signed)
[facility] [phone]   [phone]  OFFICE PROGRESS NOTE  [name], MD [place] [address]  DIAGNOSIS:  Stage IV (T2b, N2, M1 B) non-small cell lung cancer, adenocarcinoma presented with large central left lower lobe lung mass with left hilar and mediastinal lymphadenopathy as well as abdominal retroperitoneal lymphadenopathy diagnosed in May 2022.  The patient also has bilateral parotid gland nodules that need close monitoring.  CARIS MOLECULAR STUDY: Results with Therapy Associations BIOMARKER METHOD ANALYTE RESULT THERAPY ASSOCIATION BIOMARKER LEVEL* .PD-L1 (22c3) IHC Protein Positive, TPS: 50% BENEFIT cemiplimab, pembrolizumab Level 1 .PD-L1 (28-8) IHC Protein Positive   1+, 50% BENEFIT nivolumab/ipilimumab combination Level 1 .TMB Seq DNA-Tumor High, 18 mut/Mb BENEFIT pembrolizumab Level 2 . alectinib, ceritinib, crizotinib, lorlatinib Level 1 . IHC Protein Negative   0 brigatinib Level 2 . ALK Seq RNA-Tumor Fusion Not Detected LACK OF BENEFIT alectinib, brigatinib, ceritinib, crizotinib, lorlatinib Level 2 .BRAF Seq DNA-Tumor Mutation Not Detected LACK OF BENEFIT dabrafenib and trametinib combination therapy, vemurafenib Level 2 .EGFR Seq DNA-Tumor Mutation Not Detected LACK OF BENEFIT erlotinib, gefitinib Level 2 .KRAS Seq DNA-Tumor Mutation Not Detected LACK OF BENEFIT sotorasib Level 2 .RET Seq RNA-Tumor Fusion Not Detected LACK OF BENEFIT pralsetinib, selpercatinib Level 2 .ROS1 Seq RNA-Tumor Fusion Not Detected LACK OF BENEFIT ceritinib, crizotinib, entrectinib, lorlatinib Level 2 . CNA-Seq DNA-Tumor Amplification Not Detected .MET Seq RNA-Tumor Variant Transcript Not Detected LACK OF BENEFIT crizotinib Level 3  PRIOR THERAPY: Palliative radiotherapy to the large central left lower lobe lung mass under the care of Dr. Lisbeth Renshaw.  CURRENT THERAPY: Systemic chemotherapy with carboplatin for AUC  of 5, Alimta 500 Mg/M2 and possibly Keytruda 200 Mg IV every 3 weeks.  First dose expected June 14, 2021.  Starting from cycle #5 the patient is on maintenance treatment with Alimta and Keytruda every 3 weeks.  Status post 11 cycles of treatment.  Starting from cycle #12 I will reduce the dose of Alimta to 400 Mg/M2 because of his increasing fatigue and side effects.  INTERVAL HISTORY: Cameron Harper 46 y.o. male returns to the clinic today for follow-up visit.  The patient is feeling fine today with no concerning complaints except for increasing fatigue after the last cycle of his treatment.  He denied having any current chest pain, shortness of breath, cough or hemoptysis.  He has no nausea, vomiting, diarrhea or constipation.  He has no headache or visual changes.  He is here today for evaluation before starting cycle #12.   MEDICAL HISTORY: Past Medical History:  Diagnosis Date   Chronic back pain    Eczema    GERD (gastroesophageal reflux disease)    Lung cancer (HCC)     ALLERGIES:  is allergic to penicillins.  MEDICATIONS:  Current Outpatient Medications  Medication Sig Dispense Refill   acetaminophen (TYLENOL) 500 MG tablet Take 500-1,000 mg by mouth every 6 (six) hours as needed (for back pain.). (Patient not taking: Reported on 00/17/4944)     folic acid (FOLVITE) 1 MG tablet Take 1 tablet (1 mg total) by mouth daily. 30 tablet 4   lidocaine-prilocaine (EMLA) cream Apply 1 application topically as needed. 30 g 2   magic mouthwash SOLN Take 5 mLs by mouth 3 (three) times daily as needed for mouth pain. (Patient not taking: Reported on 09/27/2021) 240 mL 0   ondansetron (ZOFRAN-ODT) 4 MG disintegrating tablet Take 1 tablet (4 mg total) by mouth every 8 (eight) hours as  needed for nausea or vomiting. 30 tablet 2   prochlorperazine (COMPAZINE) 10 MG tablet Take 1 tablet (10 mg total) by mouth every 6 (six) hours as needed for nausea or vomiting. 30 tablet 0   traMADol (ULTRAM) 50  MG tablet Take 50 mg by mouth every 6 (six) hours as needed for severe pain. (Patient not taking: Reported on 09/27/2021)     zolpidem (AMBIEN) 10 MG tablet Take 5-10 mg by mouth at bedtime as needed for sleep. (Patient not taking: Reported on 09/27/2021)     No current facility-administered medications for this visit.    SURGICAL HISTORY:  Past Surgical History:  Procedure Laterality Date   BRONCHIAL BRUSHINGS  05/12/2021   Procedure: BRONCHIAL BRUSHINGS;  Surgeon: Josephine Igo, DO;  Location: [facility];  Service: Pulmonary;;   BRONCHIAL DILITATION  05/12/2021   Procedure: BRONCHIAL DILITATION;  Surgeon: Josephine Igo, DO;  [facility];  Service: Pulmonary;;   BRONCHIAL NEEDLE ASPIRATION BIOPSY  05/12/2021   Procedure: BRONCHIAL NEEDLE ASPIRATION BIOPSIES;  Surgeon: Josephine Igo, DO;  [facility];  Service: Pulmonary;;   BRONCHIAL WASHINGS  05/12/2021   Procedure: BRONCHIAL WASHINGS;  Surgeon: Josephine Igo, DO;  [facility];  Service: Pulmonary;;   CRYOTHERAPY  05/12/2021   Procedure: CRYOTHERAPY;  Surgeon: Josephine Igo, DO;  [facility];  Service: Pulmonary;;   HEMOSTASIS CONTROL  05/12/2021   Procedure: HEMOSTASIS CONTROL;  Surgeon: Josephine Igo, DO;  [facility];  Service: Pulmonary;;  epi injection   IR IMAGING GUIDED PORT INSERTION  06/29/2021   Spine injection     Pain control   VIDEO BRONCHOSCOPY WITH ENDOBRONCHIAL ULTRASOUND N/A 05/12/2021   Procedure: VIDEO BRONCHOSCOPY WITH ENDOBRONCHIAL ULTRASOUND;  Surgeon: Josephine Igo, DO;  Location: [facility];  Service: Pulmonary;  Laterality: N/A;   WISDOM TOOTH EXTRACTION     no anesthesia involed    REVIEW OF SYSTEMS:  A comprehensive review of systems was negative except for: Constitutional: positive for fatigue   PHYSICAL EXAMINATION: General appearance: alert, cooperative, fatigued, and no distress Head: Normocephalic, without obvious abnormality,  atraumatic Neck: no adenopathy, no JVD, supple, symmetrical, trachea midline, and thyroid not enlarged, symmetric, no tenderness/mass/nodules Lymph nodes: Cervical, supraclavicular, and axillary nodes normal. Resp: clear to auscultation bilaterally Back: symmetric, no curvature. ROM normal. No CVA tenderness. Cardio: regular rate and rhythm, S1, S2 normal, no murmur, click, rub or gallop GI: soft, non-tender; bowel sounds normal; no masses,  no organomegaly Extremities: extremities normal, atraumatic, no cyanosis or edema  ECOG PERFORMANCE STATUS: 1 - Symptomatic but completely ambulatory  Blood pressure 134/79, pulse 91, temperature (!) 97.5 F (36.4 C), temperature source Tympanic, resp. rate 19, height 5\' 9"  (1.753 m), weight 167 lb 1.6 oz (75.8 kg), SpO2 99 %.  LABORATORY DATA: Lab Results  Component Value Date   WBC 7.4 02/01/2022   HGB 8.8 (L) 02/01/2022   HCT 28.1 (L) 02/01/2022   MCV 97.2 02/01/2022   PLT 437 (H) 02/01/2022      Chemistry      Component Value Date/Time   NA 141 01/11/2022 0751   K 3.8 01/11/2022 0751   CL 106 01/11/2022 0751   CO2 29 01/11/2022 0751   BUN 10 01/11/2022 0751   CREATININE 1.21 01/11/2022 0751      Component Value Date/Time   CALCIUM 9.0 01/11/2022 0751   ALKPHOS 105 01/11/2022 0751   AST 19 01/11/2022 0751   ALT 23 01/11/2022 0751  BILITOT 0.2 (L) 01/11/2022 0751       RADIOGRAPHIC STUDIES: No results found.  ASSESSMENT AND PLAN: This is a very pleasant 46 years old white male recently diagnosed with a stage IV (T2b, N2, M1 B) non-small cell lung cancer, adenocarcinoma presented with large central left lower lobe lung mass in addition to left hilar and mediastinal lymphadenopathy as well as retroperitoneal abdominal lymph node diagnosed in May 2022.  The patient is currently undergoing palliative radiotherapy to the large left lower lobe lung mass under the care of of Dr. Lisbeth Renshaw.  He is expected to complete this course of  treatment on June 12, 2021. MRI of the brain showed no evidence of metastatic disease to the brain. His molecular studies by [facility] showed no actionable mutations and PD-L1 expression of 50%. He is currently undergoing systemic chemotherapy with carboplatin for AUC of 5, Alimta 500 Mg/M2 and Keytruda 200 Mg IV every 3 weeks.  Starting from cycle #5 he is on maintenance treatment with Alimta and Keytruda every 3 weeks status post 11 cycles. The patient tolerated the last cycle of his treatment well except for increasing fatigue secondary to chemotherapy-induced anemia.  He is currently on oral iron tablets. I recommended for him to proceed with cycle #12 today as planned. I will reduce his dose of Alimta to 400 Mg/M2 starting from cycle #12 secondary to the anemia and fatigue. I will see the patient back for follow-up visit in 3 weeks for evaluation with repeat CT scan of the chest, abdomen and pelvis for restaging of his disease. He was advised to call immediately if he has any other concerning symptoms in the interval. The patient voices understanding of current disease status and treatment options and is in agreement with the current care plan. All questions were answered. The patient knows to call the clinic with any problems, questions or concerns. We can certainly see the patient much sooner if necessary.  Disclaimer: This note was dictated with voice recognition software. Similar sounding words can inadvertently be transcribed and may not be corrected upon review.

## 2022-02-01 NOTE — Patient Instructions (Signed)
[facility]   ?Discharge Instructions: ?Thank you for choosing [facility] to provide your oncology and hematology care.  ? ?If you have a lab appointment with the [facility], please go directly to the [facility] and check in at the registration area. ?  ?Wear comfortable clothing and clothing appropriate for easy access to any Portacath or PICC line.  ? ?We strive to give you quality time with your provider. You may need to reschedule your appointment if you arrive late (15 or more minutes).  Arriving late affects you and other patients whose appointments are after yours.  Also, if you miss three or more appointments without notifying the office, you may be dismissed from the clinic at the provider?s discretion.    ?  ?For prescription refill requests, have your pharmacy contact our office and allow 72 hours for refills to be completed.   ? ?Today you received the following chemotherapy and/or immunotherapy agents: pembrolizumab and pemetrexed    ?  ?To help prevent nausea and vomiting after your treatment, we encourage you to take your nausea medication as directed. ? ?BELOW ARE SYMPTOMS THAT SHOULD BE REPORTED IMMEDIATELY: ?*FEVER GREATER THAN 100.4 F (38 ?C) OR HIGHER ?*CHILLS OR SWEATING ?*NAUSEA AND VOMITING THAT IS NOT CONTROLLED WITH YOUR NAUSEA MEDICATION ?*UNUSUAL SHORTNESS OF BREATH ?*UNUSUAL BRUISING OR BLEEDING ?*URINARY PROBLEMS (pain or burning when urinating, or frequent urination) ?*BOWEL PROBLEMS (unusual diarrhea, constipation, pain near the anus) ?TENDERNESS IN MOUTH AND THROAT WITH OR WITHOUT PRESENCE OF ULCERS (sore throat, sores in mouth, or a toothache) ?UNUSUAL RASH, SWELLING OR PAIN  ?UNUSUAL VAGINAL DISCHARGE OR ITCHING  ? ?Items with * indicate a potential emergency and should be followed up as soon as possible or go to the Emergency Department if any problems should occur. ? ?Please show the CHEMOTHERAPY ALERT CARD or IMMUNOTHERAPY ALERT  CARD at check-in to the Emergency Department and triage nurse. ? ?Should you have questions after your visit or need to cancel or reschedule your appointment, please contact [facility]  Dept: [phone]  and follow the prompts.  Office hours are 8:00 a.m. to 4:30 p.m. Monday - Friday. Please note that voicemails left after 4:00 p.m. may not be returned until the following business day.  We are closed weekends and major holidays. You have access to a nurse at all times for urgent questions. Please call the main number to the clinic Dept: [phone] and follow the prompts. ? ? ?For any non-urgent questions, you may also contact your provider using MyChart. We now offer e-Visits for anyone 33 and older to request care online for non-urgent symptoms. For details visit [email or web address]. ?  ?Also download the MyChart app! Go to the app store, search "MyChart", open the app, select [facility], and log in with your MyChart username and password. ? ?Due to Covid, a mask is required upon entering the hospital/clinic. If you do not have a mask, one will be given to you upon arrival. For doctor visits, patients may have 1 support person aged 31 or older with them. For treatment visits, patients cannot have anyone with them due to current Covid guidelines and our immunocompromised population.  ? ?

## 2022-02-05 ENCOUNTER — Other Ambulatory Visit: Payer: Self-pay | Admitting: Physician Assistant

## 2022-02-05 ENCOUNTER — Other Ambulatory Visit: Payer: Self-pay | Admitting: Medical Oncology

## 2022-02-05 ENCOUNTER — Telehealth: Payer: Self-pay | Admitting: Internal Medicine

## 2022-02-05 ENCOUNTER — Other Ambulatory Visit: Payer: Self-pay | Admitting: Internal Medicine

## 2022-02-05 DIAGNOSIS — C3432 Malignant neoplasm of lower lobe, left bronchus or lung: Secondary | ICD-10-CM

## 2022-02-05 MED ORDER — PROCHLORPERAZINE MALEATE 10 MG PO TABS: 10.0000 mg | ORAL_TABLET | Freq: Four times a day (QID) | ORAL | 0 refills | Status: AC | PRN

## 2022-02-05 NOTE — Telephone Encounter (Signed)
Scheduled per 03/02 los, patient has been called and voicemail was left regarding upcoming appointments. ?

## 2022-02-15 ENCOUNTER — Encounter: Payer: Self-pay | Admitting: Internal Medicine

## 2022-02-15 ENCOUNTER — Encounter: Payer: Self-pay | Admitting: Physician Assistant

## 2022-02-16 ENCOUNTER — Telehealth: Payer: Self-pay | Admitting: Physician Assistant

## 2022-02-16 NOTE — Telephone Encounter (Signed)
Called patient regarding upcoming appointments, patient is notified. 

## 2022-02-19 ENCOUNTER — Other Ambulatory Visit: Payer: Self-pay

## 2022-02-19 ENCOUNTER — Ambulatory Visit (HOSPITAL_COMMUNITY)
Admission: RE | Admit: 2022-02-19 | Discharge: 2022-02-19 | Disposition: A | Discharge status: Home / Self Care | Payer: Managed Care, Other (non HMO) | Source: Ambulatory Visit | Attending: Physician Assistant | Admitting: Physician Assistant

## 2022-02-19 DIAGNOSIS — C3432 Malignant neoplasm of lower lobe, left bronchus or lung: Secondary | ICD-10-CM | POA: Diagnosis not present

## 2022-02-19 MED ORDER — SODIUM CHLORIDE (PF) 0.9 % IJ SOLN
INTRAMUSCULAR | Status: AC
  Filled 2022-02-19: qty 50

## 2022-02-19 MED ORDER — IOHEXOL 300 MG/ML  SOLN
100.0000 mL | Freq: Once | INTRAMUSCULAR | Status: AC | PRN
  Administered 2022-02-19: 100 mL via INTRAVENOUS

## 2022-02-19 MED ORDER — HEPARIN SOD (PORK) LOCK FLUSH 100 UNIT/ML IV SOLN
INTRAVENOUS | Status: AC
  Administered 2022-02-19: 500 [IU]
  Filled 2022-02-19: qty 5

## 2022-02-19 NOTE — Progress Notes (Signed)
[facility] ?OFFICE PROGRESS NOTE ? ?[name], MD ?[address] ?[place] [address] ? ?DIAGNOSIS: Stage IV (T2b, N2, M1 B) non-small cell lung cancer, adenocarcinoma presented with large central left lower lobe lung mass with left hilar and mediastinal lymphadenopathy as well as abdominal retroperitoneal lymphadenopathy diagnosed in May 2022.  The patient also has bilateral parotid gland nodules that need close monitoring. ?  ?CARIS MOLECULAR STUDY: ?Results with Therapy Associations ?BIOMARKER METHOD ANALYTE RESULT THERAPY ASSOCIATION BIOMARKER ?LEVEL* ?.PD-L1 (22c3) IHC Protein Positive, TPS: 50% BENEFIT cemiplimab, pembrolizumab Level 1 ?Marland KitchenPD-L1 (28-8) IHC Protein Positive  1+, 50% BENEFIT nivolumab/ipilimumab combination Level 1 ?Marland KitchenTMB Seq DNA-Tumor High, 18 mut/Mb BENEFIT pembrolizumab Level 2 ?. alectinib, ceritinib, crizotinib, lorlatinib Level 1 ?. ?IHC Protein Negative  0 ?brigatinib Level 2 ?. ?ALK ?Seq RNA-Tumor Fusion Not Detected ?LACK OF ?BENEFIT ?alectinib, brigatinib, ceritinib, crizotinib, ?lorlatinib Level 2 ?Marland KitchenBRAF Seq DNA-Tumor Mutation Not Detected LACK OF ?BENEFIT ?dabrafenib and trametinib combination ?therapy, vemurafenib Level 2 ?Marland KitchenEGFR Seq DNA-Tumor Mutation Not Detected LACK OF ?BENEFIT erlotinib, gefitinib Level 2 ?Marland KitchenKRAS Seq DNA-Tumor Mutation Not Detected LACK OF ?BENEFIT sotorasib Level 2 ?.RET Seq RNA-Tumor Fusion Not Detected LACK OF ?BENEFIT pralsetinib, selpercatinib Level 2 ?Marland KitchenROS1 Seq RNA-Tumor Fusion Not Detected LACK OF ?BENEFIT ceritinib, crizotinib, entrectinib, lorlatinib Level 2 ?. CNA-Seq DNA-Tumor Amplification Not ?Detected ?Marland Kitchen ?MET ?Seq RNA-Tumor Variant Transcript Not ?Detected ?LACK OF ?BENEFIT crizotinib Level 3 ? ?PRIOR THERAPY: Palliative radiotherapy to the large central left lower lobe lung mass under the care of Dr. Lisbeth Renshaw. Last treatment on 06/12/21. ? ?CURRENT THERAPY:  Systemic chemotherapy with carboplatin for AUC of 5, Alimta 500 Mg/M2  and Keytruda 200 Mg IV every 3 weeks.  First dose expected June 14, 2021.  Status post 12 cycles of treatment.  Starting from cycle #5, the patient will begin maintenance Keytruda and Alimta.  Alimta was reduced to 400 mg per metered squared starting from cycle number 12 due to fatigue. ?  ? ?INTERVAL HISTORY: ?RICHERD GRIME 46 y.o. male returns to the clinic today for a follow-up visit.  The patient is feeling fairly well today without any concerning complaints.  At the patient's last appointment, he was having worsening fatigue from his treatment.  Therefore, Dr. Julien Nordmann reduce the dose of his Alimta.  He tolerated this last cycle fairly well. He thinks it may have been a little bit easier. He reports his fatigue starts approximately 3 days after treatment as well as decreased appetite. He was supposed to perform stool cards to rule out GI blood loss for his anemia, but he has never completed this. He denies visible bleeding. He reports his baseline shortness of breath with strenuous activity but not with normal day to day activities. He has a mild intermittent cough, but nothing unusual for him. Denies any fever, chills, night sweats. Denies any nausea, vomiting, diarrhea, or constipation.  Denies any chest pain or hemoptysis. Denies any headache or visual changes except he sometimes has a mild frontal headache that he attributes to allergies. The headache resolves without needing to take tylenol. He reports he has two discolored lesions in his right groin area. Denies pain or itching. He has been using hydrocortisone cream and they are almost completely resolved. The patient recently had a restaging CT scan performed.  The patient is here today for evaluation and to review his scan results before starting cycle #13. ? ?MEDICAL HISTORY: ?Past Medical History:  ?Diagnosis Date  ? Chronic back pain   ? Eczema   ?  GERD (gastroesophageal reflux disease)   ? Lung cancer ([facility])   ? ? ?ALLERGIES:  is allergic to  penicillins. ? ?MEDICATIONS:  ?Current Outpatient Medications  ?Medication Sig Dispense Refill  ? acetaminophen (TYLENOL) 500 MG tablet Take 500-1,000 mg by mouth every 6 (six) hours as needed (for back pain.). (Patient not taking: Reported on 09/27/2021)    ? folic acid (FOLVITE) 1 MG tablet Take 1 tablet (1 mg total) by mouth daily. 30 tablet 4  ? lidocaine-prilocaine (EMLA) cream Apply 1 application topically as needed. 30 g 2  ? magic mouthwash SOLN Take 5 mLs by mouth 3 (three) times daily as needed for mouth pain. (Patient not taking: Reported on 09/27/2021) 240 mL 0  ? ondansetron (ZOFRAN-ODT) 4 MG disintegrating tablet Take 1 tablet (4 mg total) by mouth every 8 (eight) hours as needed for nausea or vomiting. 30 tablet 2  ? prochlorperazine (COMPAZINE) 10 MG tablet Take 1 tablet (10 mg total) by mouth every 6 (six) hours as needed for nausea or vomiting. 30 tablet 0  ? traMADol (ULTRAM) 50 MG tablet Take 50 mg by mouth every 6 (six) hours as needed for severe pain. (Patient not taking: Reported on 09/27/2021)    ? zolpidem (AMBIEN) 10 MG tablet Take 5-10 mg by mouth at bedtime as needed for sleep. (Patient not taking: Reported on 09/27/2021)    ? ?No current facility-administered medications for this visit.  ? ? ?SURGICAL HISTORY:  ?Past Surgical History:  ?Procedure Laterality Date  ? BRONCHIAL BRUSHINGS  05/12/2021  ? Procedure: BRONCHIAL BRUSHINGS;  Surgeon: Garner Nash, DO;  Location: [facility];  Service: Pulmonary;;  ? BRONCHIAL DILITATION  05/12/2021  ? Procedure: BRONCHIAL DILITATION;  Surgeon: Garner Nash, DO;  Location: [facility];  Service: Pulmonary;;  ? BRONCHIAL NEEDLE ASPIRATION BIOPSY  05/12/2021  ? Procedure: BRONCHIAL NEEDLE ASPIRATION BIOPSIES;  Surgeon: Garner Nash, DO;  Location: [facility];  Service: Pulmonary;;  ? BRONCHIAL WASHINGS  05/12/2021  ? Procedure: BRONCHIAL WASHINGS;  Surgeon: Garner Nash, DO;  Location: [facility];  Service: Pulmonary;;  ?  CRYOTHERAPY  05/12/2021  ? Procedure: CRYOTHERAPY;  Surgeon: Garner Nash, DO;  Location: [facility];  Service: Pulmonary;;  ? HEMOSTASIS CONTROL  05/12/2021  ? Procedure: HEMOSTASIS CONTROL;  Surgeon: Garner Nash, DO;  [facility];  Service: Pulmonary;;  epi injection  ? IR IMAGING GUIDED PORT INSERTION  06/29/2021  ? Spine injection    ? Pain control  ? VIDEO BRONCHOSCOPY WITH ENDOBRONCHIAL ULTRASOUND N/A 05/12/2021  ? Procedure: VIDEO BRONCHOSCOPY WITH ENDOBRONCHIAL ULTRASOUND;  Surgeon: Garner Nash, DO;  Location: [facility];  Service: Pulmonary;  Laterality: N/A;  ? WISDOM TOOTH EXTRACTION    ? no anesthesia involed  ? ? ?REVIEW OF SYSTEMS:   ?Constitutional: Positive for baseline fatigue and decreased appetite a few days following treatment.  Negative for chills, fever and unexpected weight change.  ?HENT: Negative for mouth sores, nosebleeds, sore throat and trouble swallowing.   ?Eyes: Negative for eye problems and icterus.  ?Respiratory: Positive for mild cough and mild dyspnea on exertion with strenuous activities. Negative for hemoptysis and wheezing.   ?Cardiovascular: Negative for chest pain and leg swelling.  ?Gastrointestinal: Negative for abdominal pain, constipation, diarrhea, nausea and vomiting.  ?Genitourinary: Negative for bladder incontinence, difficulty urinating, dysuria, frequency and hematuria.   ?Musculoskeletal: Negative for back pain, gait problem, neck pain and neck stiffness.  ?Skin: Two small improving areas of discoloration in the right groin.  ?  Neurological: Positive for mild self limiting frontal headaches. Negative for dizziness, extremity weakness, gait problem, light-headedness and seizures.  ?Hematological: Negative for adenopathy. Does not bruise/bleed easily.  ?Psychiatric/Behavioral: Negative for confusion, depression and sleep disturbance. The patient is not nervous/anxious.   ? ? ?PHYSICAL EXAMINATION:  ?Blood pressure (!) 141/76, pulse 88,  temperature (!) 97.2 ?F (36.2 ?C), temperature source Tympanic, resp. rate 16, weight 169 lb 12.8 oz (77 kg), SpO2 100 %. ? ?ECOG PERFORMANCE STATUS: 1 ? ?Physical Exam  ?Constitutional: Oriented to person, place, and tim

## 2022-02-20 ENCOUNTER — Ambulatory Visit (HOSPITAL_COMMUNITY): Payer: Managed Care, Other (non HMO)

## 2022-02-22 ENCOUNTER — Inpatient Hospital Stay: Payer: Managed Care, Other (non HMO) | Admitting: Physician Assistant

## 2022-02-22 ENCOUNTER — Inpatient Hospital Stay: Payer: Managed Care, Other (non HMO)

## 2022-02-22 ENCOUNTER — Other Ambulatory Visit: Payer: Self-pay

## 2022-02-22 VITALS — BP 141/76 | HR 88 | Temp 97.2°F | Resp 16 | Wt 169.8 lb

## 2022-02-22 DIAGNOSIS — C3432 Malignant neoplasm of lower lobe, left bronchus or lung: Secondary | ICD-10-CM

## 2022-02-22 DIAGNOSIS — Z95828 Presence of other vascular implants and grafts: Secondary | ICD-10-CM

## 2022-02-22 DIAGNOSIS — Z5111 Encounter for antineoplastic chemotherapy: Secondary | ICD-10-CM | POA: Diagnosis not present

## 2022-02-22 DIAGNOSIS — Z5112 Encounter for antineoplastic immunotherapy: Secondary | ICD-10-CM

## 2022-02-22 LAB — CBC WITH DIFFERENTIAL (CANCER CENTER ONLY)
Abs Immature Granulocytes: 0.04 10*3/uL (ref 0.00–0.07)
Basophils Absolute: 0.1 10*3/uL (ref 0.0–0.1)
Basophils Relative: 1 %
Eosinophils Absolute: 0.1 10*3/uL (ref 0.0–0.5)
Eosinophils Relative: 1 %
HCT: 26.5 % — ABNORMAL LOW (ref 39.0–52.0)
Hemoglobin: 8.4 g/dL — ABNORMAL LOW (ref 13.0–17.0)
Immature Granulocytes: 1 %
Lymphocytes Relative: 11 %
Lymphs Abs: 0.8 10*3/uL (ref 0.7–4.0)
MCH: 31.1 pg (ref 26.0–34.0)
MCHC: 31.7 g/dL (ref 30.0–36.0)
MCV: 98.1 fL (ref 80.0–100.0)
Monocytes Absolute: 0.9 10*3/uL (ref 0.1–1.0)
Monocytes Relative: 12 %
Neutro Abs: 5.6 10*3/uL (ref 1.7–7.7)
Neutrophils Relative %: 74 %
Platelet Count: 401 10*3/uL — ABNORMAL HIGH (ref 150–400)
RBC: 2.7 MIL/uL — ABNORMAL LOW (ref 4.22–5.81)
RDW: 16.8 % — ABNORMAL HIGH (ref 11.5–15.5)
WBC Count: 7.5 10*3/uL (ref 4.0–10.5)
nRBC: 0 % (ref 0.0–0.2)

## 2022-02-22 LAB — CMP (CANCER CENTER ONLY)
ALT: 15 U/L (ref 0–44)
AST: 15 U/L (ref 15–41)
Albumin: 3.4 g/dL — ABNORMAL LOW (ref 3.5–5.0)
Alkaline Phosphatase: 106 U/L (ref 38–126)
Anion gap: 8 (ref 5–15)
BUN: 15 mg/dL (ref 6–20)
CO2: 27 mmol/L (ref 22–32)
Calcium: 9.1 mg/dL (ref 8.9–10.3)
Chloride: 105 mmol/L (ref 98–111)
Creatinine: 1.48 mg/dL — ABNORMAL HIGH (ref 0.61–1.24)
GFR, Estimated: 58 mL/min — ABNORMAL LOW (ref >60)
Glucose, Bld: 94 mg/dL (ref 70–99)
Potassium: 4 mmol/L (ref 3.5–5.1)
Sodium: 140 mmol/L (ref 135–145)
Total Bilirubin: 0.3 mg/dL (ref 0.3–1.2)
Total Protein: 7.4 g/dL (ref 6.5–8.1)

## 2022-02-22 LAB — TSH: TSH: 4.572 u[IU]/mL — ABNORMAL HIGH (ref 0.320–4.118)

## 2022-02-22 MED ORDER — SODIUM CHLORIDE 0.9% FLUSH
10.0000 mL | INTRAVENOUS | Status: DC | PRN
  Administered 2022-02-22: 10 mL

## 2022-02-22 MED ORDER — SODIUM CHLORIDE 0.9 % IV SOLN
400.0000 mg/m2 | Freq: Once | INTRAVENOUS | Status: AC
  Administered 2022-02-22: 800 mg via INTRAVENOUS
  Filled 2022-02-22: qty 20

## 2022-02-22 MED ORDER — SODIUM CHLORIDE 0.9 % IV SOLN
200.0000 mg | Freq: Once | INTRAVENOUS | Status: AC
  Administered 2022-02-22: 200 mg via INTRAVENOUS
  Filled 2022-02-22: qty 200

## 2022-02-22 MED ORDER — PROCHLORPERAZINE MALEATE 10 MG PO TABS
10.0000 mg | ORAL_TABLET | Freq: Once | ORAL | Status: AC
  Administered 2022-02-22: 10 mg via ORAL
  Filled 2022-02-22: qty 1

## 2022-02-22 MED ORDER — HEPARIN SOD (PORK) LOCK FLUSH 100 UNIT/ML IV SOLN
500.0000 [IU] | Freq: Once | INTRAVENOUS | Status: AC | PRN
  Administered 2022-02-22: 500 [IU]

## 2022-02-22 MED ORDER — SODIUM CHLORIDE 0.9 % IV SOLN: Freq: Once | INTRAVENOUS | Status: AC

## 2022-02-22 MED ORDER — SODIUM CHLORIDE 0.9% FLUSH
10.0000 mL | Freq: Once | INTRAVENOUS | Status: AC
  Administered 2022-02-22: 10 mL

## 2022-02-22 NOTE — Patient Instructions (Signed)
[facility]  Discharge Instructions: ?Thank you for choosing [facility] to provide your oncology and hematology care.  ? ?If you have a lab appointment with the [facility], please go directly to the [facility] and check in at the registration area. ?  ?Wear comfortable clothing and clothing appropriate for easy access to any Portacath or PICC line.  ? ?We strive to give you quality time with your provider. You may need to reschedule your appointment if you arrive late (15 or more minutes).  Arriving late affects you and other patients whose appointments are after yours.  Also, if you miss three or more appointments without notifying the office, you may be dismissed from the clinic at the provider?s discretion.    ?  ?For prescription refill requests, have your pharmacy contact our office and allow 72 hours for refills to be completed.   ? ?Today you received the following chemotherapy and/or immunotherapy agents: Pembrolizumab, Pemetrexed.    ?  ?To help prevent nausea and vomiting after your treatment, we encourage you to take your nausea medication as directed. ? ?BELOW ARE SYMPTOMS THAT SHOULD BE REPORTED IMMEDIATELY: ?*FEVER GREATER THAN 100.4 F (38 ?C) OR HIGHER ?*CHILLS OR SWEATING ?*NAUSEA AND VOMITING THAT IS NOT CONTROLLED WITH YOUR NAUSEA MEDICATION ?*UNUSUAL SHORTNESS OF BREATH ?*UNUSUAL BRUISING OR BLEEDING ?*URINARY PROBLEMS (pain or burning when urinating, or frequent urination) ?*BOWEL PROBLEMS (unusual diarrhea, constipation, pain near the anus) ?TENDERNESS IN MOUTH AND THROAT WITH OR WITHOUT PRESENCE OF ULCERS (sore throat, sores in mouth, or a toothache) ?UNUSUAL RASH, SWELLING OR PAIN  ?UNUSUAL VAGINAL DISCHARGE OR ITCHING  ? ?Items with * indicate a potential emergency and should be followed up as soon as possible or go to the Emergency Department if any problems should occur. ? ?Please show the CHEMOTHERAPY ALERT CARD or IMMUNOTHERAPY ALERT  CARD at check-in to the Emergency Department and triage nurse. ? ?Should you have questions after your visit or need to cancel or reschedule your appointment, please contact [facility]  Dept: [phone]  and follow the prompts.  Office hours are 8:00 a.m. to 4:30 p.m. Monday - Friday. Please note that voicemails left after 4:00 p.m. may not be returned until the following business day.  We are closed weekends and major holidays. You have access to a nurse at all times for urgent questions. Please call the main number to the clinic Dept: [phone] and follow the prompts. ? ? ?For any non-urgent questions, you may also contact your provider using MyChart. We now offer e-Visits for anyone 38 and older to request care online for non-urgent symptoms. For details visit [email or web address]. ?  ?Also download the MyChart app! Go to the app store, search "MyChart", open the app, select [facility], and log in with your MyChart username and password. ? ?Due to Covid, a mask is required upon entering the hospital/clinic. If you do not have a mask, one will be given to you upon arrival. For doctor visits, patients may have 1 support person aged 6 or older with them. For treatment visits, patients cannot have anyone with them due to current Covid guidelines and our immunocompromised population.  ? ?

## 2022-02-28 ENCOUNTER — Telehealth: Payer: Self-pay

## 2022-02-28 NOTE — Telephone Encounter (Signed)
Pt called c/o increased SOBE, increased fatigue and decreased appetite. Pt states he was advised if this could be contributing to his heart racing and other sx. Pt was advised to come in for a lab visit but declined advising the soonest he could come is tomorrow morning. Pt has be scheduled for tomorrow 03/01/22 at 9am. Pt was advised if his sx worsen, go to the nearest ER. ?

## 2022-03-01 ENCOUNTER — Inpatient Hospital Stay: Payer: Managed Care, Other (non HMO)

## 2022-03-01 ENCOUNTER — Other Ambulatory Visit: Payer: Self-pay

## 2022-03-01 ENCOUNTER — Other Ambulatory Visit: Payer: Self-pay | Admitting: Internal Medicine

## 2022-03-01 DIAGNOSIS — C3432 Malignant neoplasm of lower lobe, left bronchus or lung: Secondary | ICD-10-CM

## 2022-03-01 DIAGNOSIS — Z5112 Encounter for antineoplastic immunotherapy: Secondary | ICD-10-CM | POA: Diagnosis not present

## 2022-03-01 DIAGNOSIS — Z95828 Presence of other vascular implants and grafts: Secondary | ICD-10-CM

## 2022-03-01 LAB — CBC WITH DIFFERENTIAL (CANCER CENTER ONLY)
Abs Immature Granulocytes: 0.04 10*3/uL (ref 0.00–0.07)
Basophils Absolute: 0 10*3/uL (ref 0.0–0.1)
Basophils Relative: 1 %
Eosinophils Absolute: 0.1 10*3/uL (ref 0.0–0.5)
Eosinophils Relative: 3 %
HCT: 22 % — ABNORMAL LOW (ref 39.0–52.0)
Hemoglobin: 7.3 g/dL — ABNORMAL LOW (ref 13.0–17.0)
Immature Granulocytes: 1 %
Lymphocytes Relative: 8 %
Lymphs Abs: 0.3 10*3/uL — ABNORMAL LOW (ref 0.7–4.0)
MCH: 31.5 pg (ref 26.0–34.0)
MCHC: 33.2 g/dL (ref 30.0–36.0)
MCV: 94.8 fL (ref 80.0–100.0)
Monocytes Absolute: 0.2 10*3/uL (ref 0.1–1.0)
Monocytes Relative: 5 %
Neutro Abs: 2.7 10*3/uL (ref 1.7–7.7)
Neutrophils Relative %: 82 %
Platelet Count: 250 10*3/uL (ref 150–400)
RBC: 2.32 MIL/uL — ABNORMAL LOW (ref 4.22–5.81)
RDW: 15.5 % (ref 11.5–15.5)
Smear Review: NORMAL
WBC Count: 3.3 10*3/uL — ABNORMAL LOW (ref 4.0–10.5)
nRBC: 0 % (ref 0.0–0.2)

## 2022-03-01 LAB — CMP (CANCER CENTER ONLY)
ALT: 16 U/L (ref 0–44)
AST: 20 U/L (ref 15–41)
Albumin: 3.2 g/dL — ABNORMAL LOW (ref 3.5–5.0)
Alkaline Phosphatase: 97 U/L (ref 38–126)
Anion gap: 9 (ref 5–15)
BUN: 27 mg/dL — ABNORMAL HIGH (ref 6–20)
CO2: 26 mmol/L (ref 22–32)
Calcium: 8.7 mg/dL — ABNORMAL LOW (ref 8.9–10.3)
Chloride: 101 mmol/L (ref 98–111)
Creatinine: 1.33 mg/dL — ABNORMAL HIGH (ref 0.61–1.24)
GFR, Estimated: >60 mL/min (ref >60)
Glucose, Bld: 109 mg/dL — ABNORMAL HIGH (ref 70–99)
Potassium: 3.3 mmol/L — ABNORMAL LOW (ref 3.5–5.1)
Sodium: 136 mmol/L (ref 135–145)
Total Bilirubin: 0.4 mg/dL (ref 0.3–1.2)
Total Protein: 7.5 g/dL (ref 6.5–8.1)

## 2022-03-01 LAB — SAMPLE TO BLOOD BANK

## 2022-03-01 LAB — PREPARE RBC (CROSSMATCH)

## 2022-03-01 LAB — ABO/RH: ABO/RH(D): A NEG

## 2022-03-01 MED ORDER — HEPARIN SOD (PORK) LOCK FLUSH 100 UNIT/ML IV SOLN
500.0000 [IU] | Freq: Once | INTRAVENOUS | Status: AC
  Administered 2022-03-01: 500 [IU]

## 2022-03-01 MED ORDER — DIPHENHYDRAMINE HCL 25 MG PO CAPS: 25.0000 mg | ORAL_CAPSULE | Freq: Once | ORAL | Status: DC

## 2022-03-01 MED ORDER — SODIUM CHLORIDE 0.9% FLUSH
10.0000 mL | Freq: Once | INTRAVENOUS | Status: AC
  Administered 2022-03-01: 10 mL

## 2022-03-01 MED ORDER — ACETAMINOPHEN 325 MG PO TABS
650.0000 mg | ORAL_TABLET | Freq: Once | ORAL | Status: AC
  Administered 2022-03-01: 650 mg via ORAL
  Filled 2022-03-01: qty 2

## 2022-03-01 MED ORDER — SODIUM CHLORIDE 0.9% IV SOLUTION
250.0000 mL | Freq: Once | INTRAVENOUS | Status: AC
  Administered 2022-03-01: 250 mL via INTRAVENOUS

## 2022-03-01 MED ORDER — HEPARIN SOD (PORK) LOCK FLUSH 100 UNIT/ML IV SOLN
250.0000 [IU] | INTRAVENOUS | Status: AC | PRN
  Administered 2022-03-01: 500 [IU]

## 2022-03-01 MED ORDER — SODIUM CHLORIDE 0.9% FLUSH
10.0000 mL | INTRAVENOUS | Status: AC | PRN
  Administered 2022-03-01: 10 mL

## 2022-03-01 NOTE — Addendum Note (Signed)
Addended by: Elizebeth Brooking on: 03/01/2022 11:01 AM ? ? Modules accepted: Orders ? ?

## 2022-03-01 NOTE — Patient Instructions (Signed)
Blood Transfusion, Adult, Care After This sheet gives you information about how to care for yourself after your procedure. Your doctor may also give you more specific instructions. If you have problems or questions, contact your doctor. What can I expect after the procedure? After the procedure, it is common to have: Bruising and soreness at the IV site. A headache. Follow these instructions at home: Insertion site care   Follow instructions from your doctor about how to take care of your insertion site. This is where an IV tube was put into your vein. Make sure you: Wash your hands with soap and water before and after you change your bandage (dressing). If you cannot use soap and water, use hand sanitizer. Change your bandage as told by your doctor. Check your insertion site every day for signs of infection. Check for: Redness, swelling, or pain. Bleeding from the site. Warmth. Pus or a bad smell. General instructions Take over-the-counter and prescription medicines only as told by your doctor. Rest as told by your doctor. Go back to your normal activities as told by your doctor. Keep all follow-up visits as told by your doctor. This is important. Contact a doctor if: You have itching or red, swollen areas of skin (hives). You feel worried or nervous (anxious). You feel weak after doing your normal activities. You have redness, swelling, warmth, or pain around the insertion site. You have blood coming from the insertion site, and the blood does not stop with pressure. You have pus or a bad smell coming from the insertion site. Get help right away if: You have signs of a serious reaction. This may be coming from an allergy or the body's defense system (immune system). Signs include: Trouble breathing or shortness of breath. Swelling of the face or feeling warm (flushed). Fever or chills. Head, chest, or back pain. Dark pee (urine) or blood in the pee. Widespread rash. Fast  heartbeat. Feeling dizzy or light-headed. You may receive your blood transfusion in an outpatient setting. If so, you will be told whom to contact to report any reactions. These symptoms may be an emergency. Do not wait to see if the symptoms will go away. Get medical help right away. Call your local emergency services (911 in the [place] Do not drive yourself to the hospital. Summary Bruising and soreness at the IV site are common. Check your insertion site every day for signs of infection. Rest as told by your doctor. Go back to your normal activities as told by your doctor. Get help right away if you have signs of a serious reaction. This information is not intended to replace advice given to you by your health care provider. Make sure you discuss any questions you have with your health care provider. Document Revised: 03/16/2021 Document Reviewed: 05/14/2019 Elsevier Patient Education  2022 Elsevier Inc.  

## 2022-03-02 LAB — BPAM RBC
Blood Product Expiration Date: 202304232359
Blood Product Expiration Date: 202304232359
ISSUE DATE / TIME: 202303301148
ISSUE DATE / TIME: 202303301148
Unit Type and Rh: 600
Unit Type and Rh: 600

## 2022-03-02 LAB — TYPE AND SCREEN
ABO/RH(D): A NEG
Antibody Screen: NEGATIVE
Unit division: 0
Unit division: 0

## 2022-03-02 NOTE — Telephone Encounter (Signed)
I called pt to check on him post PRBC infusion. Pt expressed that he is feeling "like a new man" and appreciates the call and getting the blood yesterday.  ?

## 2022-03-12 ENCOUNTER — Telehealth: Payer: Self-pay

## 2022-03-12 ENCOUNTER — Other Ambulatory Visit: Payer: Self-pay

## 2022-03-12 DIAGNOSIS — D649 Anemia, unspecified: Secondary | ICD-10-CM

## 2022-03-12 LAB — OCCULT BLOOD X 1 CARD TO LAB, STOOL
Fecal Occult Bld: NEGATIVE
Fecal Occult Bld: NEGATIVE
Fecal Occult Bld: NEGATIVE

## 2022-03-12 NOTE — Telephone Encounter (Signed)
I have spoken with pt and his wife and advised as indicated. Pt and his wife expressed understanding of this information. ?

## 2022-03-12 NOTE — Telephone Encounter (Signed)
-----   Message from [place], PA-C sent at 03/12/2022 11:54 AM EDT ----- ?Can you call him and let him know this is negative.  ?----- Message ----- ?From: Interface, Lab In [facility] ?Sent: 03/12/2022  10:53 AM EDT ?To: Cassandra L Heilingoetter, PA-C ? ? ?

## 2022-03-15 ENCOUNTER — Encounter: Payer: Self-pay | Admitting: Physician Assistant

## 2022-03-15 ENCOUNTER — Encounter: Payer: Self-pay | Admitting: Internal Medicine

## 2022-03-15 ENCOUNTER — Other Ambulatory Visit: Payer: Self-pay | Admitting: Internal Medicine

## 2022-03-15 ENCOUNTER — Inpatient Hospital Stay: Payer: 59 | Attending: Physician Assistant | Admitting: Internal Medicine

## 2022-03-15 ENCOUNTER — Other Ambulatory Visit: Payer: Managed Care, Other (non HMO)

## 2022-03-15 ENCOUNTER — Inpatient Hospital Stay: Payer: 59

## 2022-03-15 ENCOUNTER — Other Ambulatory Visit: Payer: Self-pay

## 2022-03-15 ENCOUNTER — Other Ambulatory Visit: Payer: Self-pay | Admitting: Physician Assistant

## 2022-03-15 VITALS — BP 132/80 | HR 89 | Temp 97.2°F | Resp 18 | Ht 69.0 in | Wt 166.5 lb

## 2022-03-15 DIAGNOSIS — R7989 Other specified abnormal findings of blood chemistry: Secondary | ICD-10-CM

## 2022-03-15 DIAGNOSIS — Z79899 Other long term (current) drug therapy: Secondary | ICD-10-CM | POA: Diagnosis not present

## 2022-03-15 DIAGNOSIS — Z5111 Encounter for antineoplastic chemotherapy: Secondary | ICD-10-CM | POA: Diagnosis not present

## 2022-03-15 DIAGNOSIS — C3432 Malignant neoplasm of lower lobe, left bronchus or lung: Secondary | ICD-10-CM

## 2022-03-15 DIAGNOSIS — Z5112 Encounter for antineoplastic immunotherapy: Secondary | ICD-10-CM | POA: Diagnosis present

## 2022-03-15 DIAGNOSIS — Z95828 Presence of other vascular implants and grafts: Secondary | ICD-10-CM

## 2022-03-15 LAB — CMP (CANCER CENTER ONLY)
ALT: 22 U/L (ref 0–44)
AST: 18 U/L (ref 15–41)
Albumin: 3.3 g/dL — ABNORMAL LOW (ref 3.5–5.0)
Alkaline Phosphatase: 111 U/L (ref 38–126)
Anion gap: 8 (ref 5–15)
BUN: 21 mg/dL — ABNORMAL HIGH (ref 6–20)
CO2: 29 mmol/L (ref 22–32)
Calcium: 9.4 mg/dL (ref 8.9–10.3)
Chloride: 105 mmol/L (ref 98–111)
Creatinine: 1.87 mg/dL — ABNORMAL HIGH (ref 0.61–1.24)
GFR, Estimated: 44 mL/min — ABNORMAL LOW (ref >60)
Glucose, Bld: 86 mg/dL (ref 70–99)
Potassium: 4 mmol/L (ref 3.5–5.1)
Sodium: 142 mmol/L (ref 135–145)
Total Bilirubin: 0.2 mg/dL — ABNORMAL LOW (ref 0.3–1.2)
Total Protein: 7.8 g/dL (ref 6.5–8.1)

## 2022-03-15 LAB — CBC WITH DIFFERENTIAL (CANCER CENTER ONLY)
Abs Immature Granulocytes: 0.06 10*3/uL (ref 0.00–0.07)
Basophils Absolute: 0 10*3/uL (ref 0.0–0.1)
Basophils Relative: 0 %
Eosinophils Absolute: 0.2 10*3/uL (ref 0.0–0.5)
Eosinophils Relative: 2 %
HCT: 29.2 % — ABNORMAL LOW (ref 39.0–52.0)
Hemoglobin: 9.3 g/dL — ABNORMAL LOW (ref 13.0–17.0)
Immature Granulocytes: 1 %
Lymphocytes Relative: 10 %
Lymphs Abs: 0.8 10*3/uL (ref 0.7–4.0)
MCH: 30.9 pg (ref 26.0–34.0)
MCHC: 31.8 g/dL (ref 30.0–36.0)
MCV: 97 fL (ref 80.0–100.0)
Monocytes Absolute: 0.9 10*3/uL (ref 0.1–1.0)
Monocytes Relative: 13 %
Neutro Abs: 5.4 10*3/uL (ref 1.7–7.7)
Neutrophils Relative %: 74 %
Platelet Count: 392 10*3/uL (ref 150–400)
RBC: 3.01 MIL/uL — ABNORMAL LOW (ref 4.22–5.81)
RDW: 15.5 % (ref 11.5–15.5)
WBC Count: 7.3 10*3/uL (ref 4.0–10.5)
nRBC: 0 % (ref 0.0–0.2)

## 2022-03-15 LAB — TSH: TSH: 2.72 u[IU]/mL (ref 0.320–4.118)

## 2022-03-15 MED ORDER — SODIUM CHLORIDE 0.9% FLUSH
10.0000 mL | INTRAVENOUS | Status: DC | PRN
  Administered 2022-03-15: 10 mL

## 2022-03-15 MED ORDER — PROCHLORPERAZINE MALEATE 10 MG PO TABS
10.0000 mg | ORAL_TABLET | Freq: Once | ORAL | Status: AC
  Administered 2022-03-15: 10 mg via ORAL
  Filled 2022-03-15: qty 1

## 2022-03-15 MED ORDER — SODIUM CHLORIDE 0.9 % IV SOLN: Freq: Once | INTRAVENOUS | Status: AC

## 2022-03-15 MED ORDER — SODIUM CHLORIDE 0.9% FLUSH
10.0000 mL | Freq: Once | INTRAVENOUS | Status: AC
  Administered 2022-03-15: 10 mL

## 2022-03-15 MED ORDER — HEPARIN SOD (PORK) LOCK FLUSH 100 UNIT/ML IV SOLN
500.0000 [IU] | Freq: Once | INTRAVENOUS | Status: AC | PRN
  Administered 2022-03-15: 500 [IU]

## 2022-03-15 MED ORDER — SODIUM CHLORIDE 0.9 % IV SOLN
200.0000 mg | Freq: Once | INTRAVENOUS | Status: AC
  Administered 2022-03-15: 200 mg via INTRAVENOUS
  Filled 2022-03-15: qty 8

## 2022-03-15 NOTE — Progress Notes (Signed)
Per Dr. Julien Nordmann and [place], PA HOLD Alimta today d/t SCR of 1.87. OK to proceed w/ Keytruda and 1L NS.  ?

## 2022-03-15 NOTE — Patient Instructions (Addendum)
[facility]   ?Discharge Instructions: ?Thank you for choosing [facility] to provide your oncology and hematology care.  ? ?If you have a lab appointment with the [facility], please go directly to the [facility] and check in at the registration area. ?  ?Wear comfortable clothing and clothing appropriate for easy access to any Portacath or PICC line.  ? ?We strive to give you quality time with your provider. You may need to reschedule your appointment if you arrive late (15 or more minutes).  Arriving late affects you and other patients whose appointments are after yours.  Also, if you miss three or more appointments without notifying the office, you may be dismissed from the clinic at the provider?s discretion.    ?  ?For prescription refill requests, have your pharmacy contact our office and allow 72 hours for refills to be completed.   ? ?Today you received the following chemotherapy and/or immunotherapy agents: Keytruda ?  ?To help prevent nausea and vomiting after your treatment, we encourage you to take your nausea medication as directed. ? ?BELOW ARE SYMPTOMS THAT SHOULD BE REPORTED IMMEDIATELY: ?*FEVER GREATER THAN 100.4 F (38 ?C) OR HIGHER ?*CHILLS OR SWEATING ?*NAUSEA AND VOMITING THAT IS NOT CONTROLLED WITH YOUR NAUSEA MEDICATION ?*UNUSUAL SHORTNESS OF BREATH ?*UNUSUAL BRUISING OR BLEEDING ?*URINARY PROBLEMS (pain or burning when urinating, or frequent urination) ?*BOWEL PROBLEMS (unusual diarrhea, constipation, pain near the anus) ?TENDERNESS IN MOUTH AND THROAT WITH OR WITHOUT PRESENCE OF ULCERS (sore throat, sores in mouth, or a toothache) ?UNUSUAL RASH, SWELLING OR PAIN  ?UNUSUAL VAGINAL DISCHARGE OR ITCHING  ? ?Items with * indicate a potential emergency and should be followed up as soon as possible or go to the Emergency Department if any problems should occur. ? ?Please show the CHEMOTHERAPY ALERT CARD or IMMUNOTHERAPY ALERT CARD at check-in to  the Emergency Department and triage nurse. ? ?Should you have questions after your visit or need to cancel or reschedule your appointment, please contact [facility]  Dept: [phone]  and follow the prompts.  Office hours are 8:00 a.m. to 4:30 p.m. Monday - Friday. Please note that voicemails left after 4:00 p.m. may not be returned until the following business day.  We are closed weekends and major holidays. You have access to a nurse at all times for urgent questions. Please call the main number to the clinic Dept: [phone] and follow the prompts. ? ? ?For any non-urgent questions, you may also contact your provider using MyChart. We now offer e-Visits for anyone 60 and older to request care online for non-urgent symptoms. For details visit [email or web address]. ?  ?Also download the MyChart app! Go to the app store, search "MyChart", open the app, select , and log in with your MyChart username and password. ? ?Due to Covid, a mask is required upon entering the hospital/clinic. If you do not have a mask, one will be given to you upon arrival. For doctor visits, patients may have 1 support person aged 54 or older with them. For treatment visits, patients cannot have anyone with them due to current Covid guidelines and our immunocompromised population.  ? ?Rehydration, Adult ?Rehydration is the replacement of body fluids, salts, and minerals (electrolytes) that are lost during dehydration. Dehydration is when there is not enough water or other fluids in the body. This happens when you lose more fluids than you take in. Common causes of dehydration include: ?Not drinking enough  fluids. This can occur when you are ill or doing activities that require a lot of energy, especially in hot weather. ?Conditions that cause loss of water or other fluids, such as diarrhea, vomiting, sweating, or urinating a lot. ?Other illnesses, such as fever or infection. ?Certain medicines,  such as those that remove excess fluid from the body (diuretics). ?Symptoms of mild or moderate dehydration may include thirst, dry lips and mouth, and dizziness. Symptoms of severe dehydration may include increased heart rate, confusion, fainting, and not urinating. ?For severe dehydration, you may need to get fluids through an IV at the hospital. For mild or moderate dehydration, you can usually rehydrate at home by drinking certain fluids as told by your health care provider. ?What are the risks? ?Generally, rehydration is safe. However, taking in too much fluid (overhydration) can be a problem. This is rare. Overhydration can cause an electrolyte imbalance, kidney failure, or a decrease in salt (sodium) levels in the body. ?Supplies needed ?You will need an oral rehydration solution (ORS) if your health care provider tells you to use one. This is a drink to treat dehydration. It can be found in pharmacies and retail stores. ?How to rehydrate ?Fluids ?Follow instructions from your health care provider for rehydration. The kind of fluid and the amount you should drink depend on your condition. In general, you should choose drinks that you prefer. ?If told by your health care provider, drink an ORS. ?Make an ORS by following instructions on the package. ?Start by drinking small amounts, about ? cup (120 mL) every 5-10 minutes. ?Slowly increase how much you drink until you have taken the amount recommended by your health care provider. ?Drink enough clear fluids to keep your urine pale yellow. If you were told to drink an ORS, finish it first, then start slowly drinking other clear fluids. Drink fluids such as: ?Water. This includes sparkling water and flavored water. Drinking only water can lead to having too little sodium in your body (hyponatremia). Follow the advice of your health care provider. ?Water from ice chips you suck on. ?Fruit juice with water you add to it (diluted). ?Sports drinks. ?Hot or cold  herbal teas. ?Broth-based soups. ?Milk or milk products. ?Food ?Follow instructions from your health care provider about what to eat while you rehydrate. Your health care provider may recommend that you slowly begin eating regular foods in small amounts. ?Eat foods that contain a healthy balance of electrolytes, such as bananas, oranges, potatoes, tomatoes, and spinach. ?Avoid foods that are greasy or contain a lot of sugar. ?In some cases, you may get nutrition through a feeding tube that is passed through your nose and into your stomach (nasogastric tube, or NG tube). This may be done if you have uncontrolled vomiting or diarrhea. ?Beverages to avoid ?Certain beverages may make dehydration worse. While you rehydrate, avoid drinking alcohol. ?How to tell if you are recovering from dehydration ?You may be recovering from dehydration if: ?You are urinating more often than before you started rehydrating. ?Your urine is pale yellow. ?Your energy level improves. ?You vomit less frequently. ?You have diarrhea less frequently. ?Your appetite improves or returns to normal. ?You feel less dizzy or less light-headed. ?Your skin tone and color start to look more normal. ?Follow these instructions at home: ?Take over-the-counter and prescription medicines only as told by your health care provider. ?Do not take sodium tablets. Doing this can lead to having too much sodium in your body (hypernatremia). ?Contact a health care provider  if: ?You continue to have symptoms of mild or moderate dehydration, such as: ?Thirst. ?Dry lips. ?Slightly dry mouth. ?Dizziness. ?Dark urine or less urine than normal. ?Muscle cramps. ?You continue to vomit or have diarrhea. ?Get help right away if you: ?Have symptoms of dehydration that get worse. ?Have a fever. ?Have a severe headache. ?Have been vomiting and the following happens: ?Your vomiting gets worse or does not go away. ?Your vomit includes blood or green matter (bile). ?You cannot eat or  drink without vomiting. ?Have problems with urination or bowel movements, such as: ?Diarrhea that gets worse or does not go away. ?Blood in your stool (feces). This may cause stool to look black and tarry.

## 2022-03-15 NOTE — Progress Notes (Signed)
?    [facility] ?[phone]   [phone] ? ?OFFICE PROGRESS NOTE ? ?[name], MD ?[address] ?[place] [address] [address] ? ?DIAGNOSIS:  Stage IV (T2b, N2, M1 B) non-small cell lung cancer, adenocarcinoma presented with large central left lower lobe lung mass with left hilar and mediastinal lymphadenopathy as well as abdominal retroperitoneal lymphadenopathy diagnosed in May 2022.  The patient also has bilateral parotid gland nodules that need close monitoring. ? ?CARIS MOLECULAR STUDY: ?Results with Therapy Associations ?BIOMARKER METHOD ANALYTE RESULT THERAPY ASSOCIATION BIOMARKER ?LEVEL* ?.PD-L1 (22c3) IHC Protein Positive, TPS: 50% BENEFIT cemiplimab, pembrolizumab Level 1 ?Marland KitchenPD-L1 (28-8) IHC Protein Positive  1+, 50% BENEFIT nivolumab/ipilimumab combination Level 1 ?Marland KitchenTMB Seq DNA-Tumor High, 18 mut/Mb BENEFIT pembrolizumab Level 2 ?. alectinib, ceritinib, crizotinib, lorlatinib Level 1 ?. ?IHC Protein Negative  0 ?brigatinib Level 2 ?. ?[facility] ?Seq RNA-Tumor Fusion Not Detected ?LACK OF ?BENEFIT ?alectinib, brigatinib, ceritinib, crizotinib, ?lorlatinib Level 2 ?Marland KitchenBRAF Seq DNA-Tumor Mutation Not Detected LACK OF ?BENEFIT ?dabrafenib and trametinib combination ?therapy, vemurafenib Level 2 ?Marland KitchenEGFR Seq DNA-Tumor Mutation Not Detected LACK OF ?BENEFIT erlotinib, gefitinib Level 2 ?Marland KitchenKRAS Seq DNA-Tumor Mutation Not Detected LACK OF ?BENEFIT sotorasib Level 2 ?.RET Seq RNA-Tumor Fusion Not Detected LACK OF ?BENEFIT pralsetinib, selpercatinib Level 2 ?Marland KitchenROS1 Seq RNA-Tumor Fusion Not Detected LACK OF ?BENEFIT ceritinib, crizotinib, entrectinib, lorlatinib Level 2 ?. CNA-Seq DNA-Tumor Amplification Not Detected ?Marland KitchenMET Seq RNA-Tumor Variant Transcript Not ?Detected LACK OF BENEFIT crizotinib Level 3 ? ?PRIOR THERAPY: Palliative radiotherapy to the large central left lower lobe lung mass under the care of Dr. Lisbeth Renshaw. ? ?CURRENT THERAPY: Systemic chemotherapy with carboplatin for AUC  of 5, Alimta 500 Mg/M2 and possibly Keytruda 200 Mg IV every 3 weeks.  First dose expected June 14, 2021.  Starting from cycle #5 the patient is on maintenance treatment with Alimta and Keytruda every 3 weeks.  Status post 13 cycles of treatment.  Starting from cycle #12 I will reduce the dose of Alimta to 400 Mg/M2 because of his increasing fatigue and side effects. ? ?INTERVAL HISTORY: ?[facility] 46 y.o. male returns to the clinic today for follow-up visit.  The patient is feeling fine today with no concerning complaints except for mild fatigue.  He was feeling very bad few weeks ago secondary to chemotherapy-induced anemia.  He received 2 units of PRBCs transfusion and he is feeling much better.  He denied having any current chest pain, shortness of breath, cough or hemoptysis.  He denied having any nausea, vomiting, diarrhea or constipation.  He has stool for Hemoccult that was reported to be negative.  He has no headache or visual changes.  He is here today for evaluation before starting cycle #14. ? ? ?MEDICAL HISTORY: ?Past Medical History:  ?Diagnosis Date  ? Chronic back pain   ? Eczema   ? GERD (gastroesophageal reflux disease)   ? Lung cancer ([facility])   ? ? ?ALLERGIES:  is allergic to penicillins. ? ?MEDICATIONS:  ?Current Outpatient Medications  ?Medication Sig Dispense Refill  ? acetaminophen (TYLENOL) 500 MG tablet Take 500-1,000 mg by mouth every 6 (six) hours as needed (for back pain.). (Patient not taking: Reported on 09/27/2021)    ? folic acid (FOLVITE) 1 MG tablet Take 1 tablet (1 mg total) by mouth daily. 30 tablet 4  ? lidocaine-prilocaine (EMLA) cream Apply 1 application topically as needed. 30 g 2  ? magic mouthwash SOLN Take 5 mLs by mouth 3 (three) times daily as needed for mouth  pain. (Patient not taking: Reported on 09/27/2021) 240 mL 0  ? ondansetron (ZOFRAN-ODT) 4 MG disintegrating tablet Take 1 tablet (4 mg total) by mouth every 8 (eight) hours as needed for nausea or vomiting.  30 tablet 2  ? prochlorperazine (COMPAZINE) 10 MG tablet Take 1 tablet (10 mg total) by mouth every 6 (six) hours as needed for nausea or vomiting. 30 tablet 0  ? traMADol (ULTRAM) 50 MG tablet Take 50 mg by mouth every 6 (six) hours as needed for severe pain. (Patient not taking: Reported on 09/27/2021)    ? zolpidem (AMBIEN) 10 MG tablet Take 5-10 mg by mouth at bedtime as needed for sleep. (Patient not taking: Reported on 09/27/2021)    ? ?No current facility-administered medications for this visit.  ? ? ?SURGICAL HISTORY:  ?Past Surgical History:  ?Procedure Laterality Date  ? BRONCHIAL BRUSHINGS  05/12/2021  ? Procedure: BRONCHIAL BRUSHINGS;  Surgeon: Garner Nash, DO;  Location: [facility];  Service: Pulmonary;;  ? BRONCHIAL DILITATION  05/12/2021  ? Procedure: BRONCHIAL DILITATION;  Surgeon: Garner Nash, DO;  Location: [facility];  Service: Pulmonary;;  ? BRONCHIAL NEEDLE ASPIRATION BIOPSY  05/12/2021  ? Procedure: BRONCHIAL NEEDLE ASPIRATION BIOPSIES;  Surgeon: Garner Nash, DO;  Location: [facility];  Service: Pulmonary;;  ? BRONCHIAL WASHINGS  05/12/2021  ? Procedure: BRONCHIAL WASHINGS;  Surgeon: Garner Nash, DO;  Location: [facility];  Service: Pulmonary;;  ? CRYOTHERAPY  05/12/2021  ? Procedure: CRYOTHERAPY;  Surgeon: Garner Nash, DO;  Location: [facility];  Service: Pulmonary;;  ? HEMOSTASIS CONTROL  05/12/2021  ? Procedure: HEMOSTASIS CONTROL;  Surgeon: Garner Nash, DO;  Location: [facility];  Service: Pulmonary;;  epi injection  ? IR IMAGING GUIDED PORT INSERTION  06/29/2021  ? Spine injection    ? Pain control  ? VIDEO BRONCHOSCOPY WITH ENDOBRONCHIAL ULTRASOUND N/A 05/12/2021  ? Procedure: VIDEO BRONCHOSCOPY WITH ENDOBRONCHIAL ULTRASOUND;  Surgeon: Garner Nash, DO;  Location: [facility];  Service: Pulmonary;  Laterality: N/A;  ? WISDOM TOOTH EXTRACTION    ? no anesthesia involed  ? ? ?REVIEW OF SYSTEMS:  A comprehensive review of systems was negative except  for: Constitutional: positive for fatigue  ? ?PHYSICAL EXAMINATION: General appearance: alert, cooperative, fatigued, and no distress ?Head: Normocephalic, without obvious abnormality, atraumatic ?Neck: no adenopathy, no JVD, supple, symmetrical, trachea midline, and thyroid not enlarged, symmetric, no tenderness/mass/nodules ?Lymph nodes: Cervical, supraclavicular, and axillary nodes normal. ?Resp: clear to auscultation bilaterally ?Back: symmetric, no curvature. ROM normal. No CVA tenderness. ?Cardio: regular rate and rhythm, S1, S2 normal, no murmur, click, rub or gallop ?GI: soft, non-tender; bowel sounds normal; no masses,  no organomegaly ?Extremities: extremities normal, atraumatic, no cyanosis or edema ? ?ECOG PERFORMANCE STATUS: 1 - Symptomatic but completely ambulatory ? ?Blood pressure 132/80, pulse 89, temperature (!) 97.2 ?F (36.2 ?C), temperature source Tympanic, resp. rate 18, height $RemoveBe'5\' 9"'YkrHIDdlN$  (1.753 m), weight 166 lb 8 oz (75.5 kg), SpO2 99 %. ? ?LABORATORY DATA: ?Lab Results  ?Component Value Date  ? WBC 3.3 (L) 03/01/2022  ? HGB 7.3 (L) 03/01/2022  ? HCT 22.0 (L) 03/01/2022  ? MCV 94.8 03/01/2022  ? PLT 250 03/01/2022  ? ? ?  Chemistry   ?   ?Component Value Date/Time  ? NA 136 03/01/2022 0915  ? K 3.3 (L) 03/01/2022 0915  ? CL 101 03/01/2022 0915  ? CO2 26 03/01/2022 0915  ? BUN 27 (H) 03/01/2022 0915  ? CREATININE 1.33 (H) 03/01/2022  0915  ?    ?Component Value Date/Time  ? CALCIUM 8.7 (L) 03/01/2022 0915  ? ALKPHOS 97 03/01/2022 0915  ? AST 20 03/01/2022 0915  ? ALT 16 03/01/2022 0915  ? BILITOT 0.4 03/01/2022 0915  ?  ? ? ? ?RADIOGRAPHIC STUDIES: ?CT Chest W Contrast ? ?Result Date: 02/19/2022 ?CLINICAL DATA:  Metastatic non-small cell lung cancer, assess treatment response, ongoing chemotherapy * Tracking Code: BO * EXAM: CT CHEST, ABDOMEN, AND PELVIS WITH CONTRAST TECHNIQUE: Multidetector CT imaging of the chest, abdomen and pelvis was performed following the standard protocol during bolus  administration of intravenous contrast. RADIATION DOSE REDUCTION: This exam was performed according to the departmental dose-optimization program which includes automated exposure control, adjustment of the mA and

## 2022-03-27 ENCOUNTER — Encounter: Payer: Self-pay | Admitting: Physician Assistant

## 2022-03-27 ENCOUNTER — Encounter: Payer: Self-pay | Admitting: Internal Medicine

## 2022-04-03 ENCOUNTER — Telehealth: Payer: Self-pay | Admitting: Internal Medicine

## 2022-04-03 NOTE — Telephone Encounter (Signed)
Called patient regarding rescheduled appointment, patient is notified. ?

## 2022-04-05 ENCOUNTER — Inpatient Hospital Stay: Payer: 59

## 2022-04-05 ENCOUNTER — Other Ambulatory Visit: Payer: Self-pay | Admitting: *Deleted

## 2022-04-05 ENCOUNTER — Inpatient Hospital Stay: Payer: 59 | Attending: Physician Assistant | Admitting: Physician Assistant

## 2022-04-05 ENCOUNTER — Other Ambulatory Visit: Payer: Managed Care, Other (non HMO)

## 2022-04-05 ENCOUNTER — Other Ambulatory Visit: Payer: Self-pay

## 2022-04-05 VITALS — BP 140/78 | HR 78 | Temp 97.9°F | Resp 17 | Ht 69.0 in | Wt 170.6 lb

## 2022-04-05 DIAGNOSIS — D649 Anemia, unspecified: Secondary | ICD-10-CM | POA: Diagnosis not present

## 2022-04-05 DIAGNOSIS — Z79899 Other long term (current) drug therapy: Secondary | ICD-10-CM | POA: Insufficient documentation

## 2022-04-05 DIAGNOSIS — C3432 Malignant neoplasm of lower lobe, left bronchus or lung: Secondary | ICD-10-CM | POA: Diagnosis present

## 2022-04-05 DIAGNOSIS — R7989 Other specified abnormal findings of blood chemistry: Secondary | ICD-10-CM | POA: Diagnosis not present

## 2022-04-05 DIAGNOSIS — Z5112 Encounter for antineoplastic immunotherapy: Secondary | ICD-10-CM | POA: Diagnosis present

## 2022-04-05 DIAGNOSIS — Z87891 Personal history of nicotine dependence: Secondary | ICD-10-CM | POA: Diagnosis not present

## 2022-04-05 DIAGNOSIS — Z95828 Presence of other vascular implants and grafts: Secondary | ICD-10-CM

## 2022-04-05 LAB — CBC WITH DIFFERENTIAL (CANCER CENTER ONLY)
Abs Immature Granulocytes: 0.05 10*3/uL (ref 0.00–0.07)
Basophils Absolute: 0.1 10*3/uL (ref 0.0–0.1)
Basophils Relative: 1 %
Eosinophils Absolute: 0.1 10*3/uL (ref 0.0–0.5)
Eosinophils Relative: 1 %
HCT: 27.7 % — ABNORMAL LOW (ref 39.0–52.0)
Hemoglobin: 9.1 g/dL — ABNORMAL LOW (ref 13.0–17.0)
Immature Granulocytes: 1 %
Lymphocytes Relative: 10 %
Lymphs Abs: 0.9 10*3/uL (ref 0.7–4.0)
MCH: 31.5 pg (ref 26.0–34.0)
MCHC: 32.9 g/dL (ref 30.0–36.0)
MCV: 95.8 fL (ref 80.0–100.0)
Monocytes Absolute: 0.8 10*3/uL (ref 0.1–1.0)
Monocytes Relative: 9 %
Neutro Abs: 7.3 10*3/uL (ref 1.7–7.7)
Neutrophils Relative %: 78 %
Platelet Count: 293 10*3/uL (ref 150–400)
RBC: 2.89 MIL/uL — ABNORMAL LOW (ref 4.22–5.81)
RDW: 14.7 % (ref 11.5–15.5)
WBC Count: 9.2 10*3/uL (ref 4.0–10.5)
nRBC: 0 % (ref 0.0–0.2)

## 2022-04-05 LAB — CMP (CANCER CENTER ONLY)
ALT: 9 U/L (ref 0–44)
AST: 12 U/L — ABNORMAL LOW (ref 15–41)
Albumin: 3.4 g/dL — ABNORMAL LOW (ref 3.5–5.0)
Alkaline Phosphatase: 95 U/L (ref 38–126)
Anion gap: 7 (ref 5–15)
BUN: 20 mg/dL (ref 6–20)
CO2: 27 mmol/L (ref 22–32)
Calcium: 9 mg/dL (ref 8.9–10.3)
Chloride: 106 mmol/L (ref 98–111)
Creatinine: 1.78 mg/dL — ABNORMAL HIGH (ref 0.61–1.24)
GFR, Estimated: 46 mL/min — ABNORMAL LOW (ref >60)
Glucose, Bld: 147 mg/dL — ABNORMAL HIGH (ref 70–99)
Potassium: 3.9 mmol/L (ref 3.5–5.1)
Sodium: 140 mmol/L (ref 135–145)
Total Bilirubin: 0.2 mg/dL — ABNORMAL LOW (ref 0.3–1.2)
Total Protein: 7.3 g/dL (ref 6.5–8.1)

## 2022-04-05 LAB — SAMPLE TO BLOOD BANK

## 2022-04-05 LAB — FERRITIN: Ferritin: 507 ng/mL — ABNORMAL HIGH (ref 24–336)

## 2022-04-05 LAB — IRON AND IRON BINDING CAPACITY (CC-WL,HP ONLY)
Iron: 48 ug/dL (ref 45–182)
Saturation Ratios: 14 % — ABNORMAL LOW (ref 17.9–39.5)
TIBC: 343 ug/dL (ref 250–450)
UIBC: 295 ug/dL (ref 117–376)

## 2022-04-05 LAB — FOLATE: Folate: 18.6 ng/mL (ref >5.9)

## 2022-04-05 LAB — VITAMIN B12: Vitamin B-12: 267 pg/mL (ref 180–914)

## 2022-04-05 LAB — TSH: TSH: 2.425 u[IU]/mL (ref 0.350–4.500)

## 2022-04-05 MED ORDER — SODIUM CHLORIDE 0.9% FLUSH
10.0000 mL | INTRAVENOUS | Status: DC | PRN
  Administered 2022-04-05: 10 mL

## 2022-04-05 MED ORDER — SODIUM CHLORIDE 0.9% FLUSH
10.0000 mL | Freq: Once | INTRAVENOUS | Status: AC
  Administered 2022-04-05: 10 mL

## 2022-04-05 MED ORDER — SODIUM CHLORIDE 0.9 % IV SOLN: Freq: Once | INTRAVENOUS | Status: AC

## 2022-04-05 MED ORDER — SODIUM CHLORIDE 0.9 % IV SOLN
200.0000 mg | Freq: Once | INTRAVENOUS | Status: AC
  Administered 2022-04-05: 200 mg via INTRAVENOUS
  Filled 2022-04-05: qty 200

## 2022-04-05 MED ORDER — HEPARIN SOD (PORK) LOCK FLUSH 100 UNIT/ML IV SOLN
500.0000 [IU] | Freq: Once | INTRAVENOUS | Status: AC | PRN
  Administered 2022-04-05: 500 [IU]

## 2022-04-05 NOTE — Progress Notes (Signed)
Per Dede Query, PA pt. to receive Keytruda and 1 liter normal saline only today, no Pemetrexed. ?

## 2022-04-05 NOTE — Patient Instructions (Signed)
Cameron Harper  Discharge Instructions: ?Thank you for choosing [facility] to provide your oncology and hematology care.  ? ?If you have a lab appointment with the [facility], please go directly to the [facility] and check in at the registration area. ?  ?Wear comfortable clothing and clothing appropriate for easy access to any Portacath or PICC line.  ? ?We strive to give you quality time with your provider. You may need to reschedule your appointment if you arrive late (15 or more minutes).  Arriving late affects you and other patients whose appointments are after yours.  Also, if you miss three or more appointments without notifying the office, you may be dismissed from the clinic at the provider?s discretion.    ?  ?For prescription refill requests, have your pharmacy contact our office and allow 72 hours for refills to be completed.   ? ?Today you received the following chemotherapy and/or immunotherapy agent: Pembrolizumab [name]) ?  ?To help prevent nausea and vomiting after your treatment, we encourage you to take your nausea medication as directed. ? ?BELOW ARE SYMPTOMS THAT SHOULD BE REPORTED IMMEDIATELY: ?*FEVER GREATER THAN 100.4 F (38 ?C) OR HIGHER ?*CHILLS OR SWEATING ?*NAUSEA AND VOMITING THAT IS NOT CONTROLLED WITH YOUR NAUSEA MEDICATION ?*UNUSUAL SHORTNESS OF BREATH ?*UNUSUAL BRUISING OR BLEEDING ?*URINARY PROBLEMS (pain or burning when urinating, or frequent urination) ?*BOWEL PROBLEMS (unusual diarrhea, constipation, pain near the anus) ?TENDERNESS IN MOUTH AND THROAT WITH OR WITHOUT PRESENCE OF ULCERS (sore throat, sores in mouth, or a toothache) ?UNUSUAL RASH, SWELLING OR PAIN  ?UNUSUAL VAGINAL DISCHARGE OR ITCHING  ? ?Items with * indicate a potential emergency and should be followed up as soon as possible or go to the Emergency Department if any problems should occur. ? ?Please show the CHEMOTHERAPY ALERT CARD or IMMUNOTHERAPY ALERT CARD at  check-in to the Emergency Department and triage nurse. ? ?Should you have questions after your visit or need to cancel or reschedule your appointment, please contact [facility]  Dept: [phone]  and follow the prompts.  Office hours are 8:00 a.m. to 4:30 p.m. Monday - Friday. Please note that voicemails left after 4:00 p.m. may not be returned until the following business day.  We are closed weekends and major holidays. You have access to a nurse at all times for urgent questions. Please call the main number to the clinic Dept: [phone] and follow the prompts. ? ? ?For any non-urgent questions, you may also contact your provider using MyChart. We now offer e-Visits for anyone 68 and older to request care online for non-urgent symptoms. For details visit [email or web address]. ?  ?Also download the MyChart app! Go to the app store, search "MyChart", open the app, select [facility], and log in with your MyChart username and password. ? ?Due to Covid, a mask is required upon entering the hospital/clinic. If you do not have a mask, one will be given to you upon arrival. For doctor visits, patients may have 1 support person aged 47 or older with them. For treatment visits, patients cannot have anyone with them due to current Covid guidelines and our immunocompromised population.  ? ?Rehydration, Adult ?Rehydration is the replacement of body fluids, salts, and minerals (electrolytes) that are lost during dehydration. Dehydration is when there is not enough water or other fluids in the body. This happens when you lose more fluids than you take in. Common causes of dehydration include: ?Not drinking enough  fluids. This can occur when you are ill or doing activities that require a lot of energy, especially in hot weather. ?Conditions that cause loss of water or other fluids, such as diarrhea, vomiting, sweating, or urinating a lot. ?Other illnesses, such as fever or  infection. ?Certain medicines, such as those that remove excess fluid from the body (diuretics). ?Symptoms of mild or moderate dehydration may include thirst, dry lips and mouth, and dizziness. Symptoms of severe dehydration may include increased heart rate, confusion, fainting, and not urinating. ?For severe dehydration, you may need to get fluids through an IV at the hospital. For mild or moderate dehydration, you can usually rehydrate at home by drinking certain fluids as told by your health care provider. ?What are the risks? ?Generally, rehydration is safe. However, taking in too much fluid (overhydration) can be a problem. This is rare. Overhydration can cause an electrolyte imbalance, kidney failure, or a decrease in salt (sodium) levels in the body. ?Supplies needed ?You will need an oral rehydration solution (ORS) if your health care provider tells you to use one. This is a drink to treat dehydration. It can be found in pharmacies and retail stores. ?How to rehydrate ?Fluids ?Follow instructions from your health care provider for rehydration. The kind of fluid and the amount you should drink depend on your condition. In general, you should choose drinks that you prefer. ?If told by your health care provider, drink an ORS. ?Make an ORS by following instructions on the package. ?Start by drinking small amounts, about ? cup (120 mL) every 5-10 minutes. ?Slowly increase how much you drink until you have taken the amount recommended by your health care provider. ?Drink enough clear fluids to keep your urine pale yellow. If you were told to drink an ORS, finish it first, then start slowly drinking other clear fluids. Drink fluids such as: ?Water. This includes sparkling water and flavored water. Drinking only water can lead to having too little sodium in your body (hyponatremia). Follow the advice of your health care provider. ?Water from ice chips you suck on. ?Fruit juice with water you add to it  (diluted). ?Sports drinks. ?Hot or cold herbal teas. ?Broth-based soups. ?Milk or milk products. ?Food ?Follow instructions from your health care provider about what to eat while you rehydrate. Your health care provider may recommend that you slowly begin eating regular foods in small amounts. ?Eat foods that contain a healthy balance of electrolytes, such as bananas, oranges, potatoes, tomatoes, and spinach. ?Avoid foods that are greasy or contain a lot of sugar. ?In some cases, you may get nutrition through a feeding tube that is passed through your nose and into your stomach (nasogastric tube, or NG tube). This may be done if you have uncontrolled vomiting or diarrhea. ?Beverages to avoid ? ?Certain beverages may make dehydration worse. While you rehydrate, avoid drinking alcohol. ?How to tell if you are recovering from dehydration ?You may be recovering from dehydration if: ?You are urinating more often than before you started rehydrating. ?Your urine is pale yellow. ?Your energy level improves. ?You vomit less frequently. ?You have diarrhea less frequently. ?Your appetite improves or returns to normal. ?You feel less dizzy or less light-headed. ?Your skin tone and color start to look more normal. ?Follow these instructions at home: ?Take over-the-counter and prescription medicines only as told by your health care provider. ?Do not take sodium tablets. Doing this can lead to having too much sodium in your body (hypernatremia). ?Contact a health care  provider if: ?You continue to have symptoms of mild or moderate dehydration, such as: ?Thirst. ?Dry lips. ?Slightly dry mouth. ?Dizziness. ?Dark urine or less urine than normal. ?Muscle cramps. ?You continue to vomit or have diarrhea. ?Get help right away if you: ?Have symptoms of dehydration that get worse. ?Have a fever. ?Have a severe headache. ?Have been vomiting and the following happens: ?Your vomiting gets worse or does not go away. ?Your vomit includes blood  or green matter (bile). ?You cannot eat or drink without vomiting. ?Have problems with urination or bowel movements, such as: ?Diarrhea that gets worse or does not go away. ?Blood in your stool (feces). This may cause stool to look b

## 2022-04-05 NOTE — Progress Notes (Signed)
?    [facility] ?[phone]   [phone] ? ?OFFICE PROGRESS NOTE ? ?[name], MD ?[address] ?[place] [address] ? ?DIAGNOSIS:  Stage IV (T2b, N2, M1 B) non-small cell lung cancer, adenocarcinoma presented with large central left lower lobe lung mass with left hilar and mediastinal lymphadenopathy as well as abdominal retroperitoneal lymphadenopathy diagnosed in May 2022.  The patient also has bilateral parotid gland nodules that need close monitoring. ? ?CARIS MOLECULAR STUDY: ?Results with Therapy Associations ?BIOMARKER METHOD ANALYTE RESULT [employer]ER[employer]Y [employer]OC[employer]ATION BIOMARKER ?LEVEL* ?.PD-L1 (22c3) IHC Protein Positive, TPS: 50% BENEFIT cemiplimab, pembrolizumab Level 1 ?Marland KitchenPD-L1 (28-8) IHC Protein Positive  1+, 50% BENEFIT nivolumab/ipilimumab combination Level 1 ?Marland KitchenTMB Seq DNA-Tumor High, 18 mut/Mb BENEFIT pembrolizumab Level 2 ?. alectinib, ceritinib, crizotinib, lorlatinib Level 1 ?. ?IHC Protein Negative  0 ?brigatinib Level 2 ?. ?ALK ?Seq RNA-Tumor Fusion Not Detected ?LACK OF ?BENEFIT ?alectinib, brigatinib, ceritinib, crizotinib, ?lorlatinib Level 2 ?Marland KitchenBRAF Seq DNA-Tumor Mutation Not Detected LACK OF ?BENEFIT ?dabrafenib and trametinib combination ?therapy, vemurafenib Level 2 ?Marland KitchenEGFR Seq DNA-Tumor Mutation Not Detected LACK OF ?BENEFIT erlotinib, gefitinib Level 2 ?Marland KitchenKRAS Seq DNA-Tumor Mutation Not Detected LACK OF ?BENEFIT sotorasib Level 2 ?.RET Seq RNA-Tumor Fusion Not Detected LACK OF ?BENEFIT pralsetinib, selpercatinib Level 2 ?Marland KitchenROS1 Seq RNA-Tumor Fusion Not Detected LACK OF ?BENEFIT ceritinib, crizotinib, entrectinib, lorlatinib Level 2 ?. CNA-Seq DNA-Tumor Amplification Not Detected ?Marland KitchenMET Seq RNA-Tumor Variant Transcript Not ?Detected LACK OF BENEFIT crizotinib Level 3 ? ?PRIOR THERAPY: Palliative radiotherapy to the large central left lower lobe lung mass under the care of Dr. Lisbeth Renshaw. ? ?CURRENT THERAPY: Systemic chemotherapy with carboplatin for AUC  of 5, Alimta 500 Mg/M2 and possibly Keytruda 200 Mg IV every 3 weeks.  First dose expected June 14, 2021.  Starting from cycle #5 the patient is on maintenance treatment with Alimta and Keytruda every 3 weeks.  Status post 13 cycles of treatment.  Starting from cycle #12 I will reduce the dose of Alimta to 400 Mg/M2 because of his increasing fatigue and side effects. ? ?INTERVAL HISTORY: ?Cameron Harper 46 y.o. male returns to the clinic today for follow-up visit.  HE reports that he felt better after only receiving Keytruda for the last cycle. His energy and appetite improved. He is able to complete all his ADLs on his own. He denies nausea, vomiting or abdominal pain. He bowel habits are  unchanged. He denies any urinary symptoms. He denies easy bruising or signs of active bleeding. He denies fevers, chills, night sweats, shortness of breath, chest pain or cough. He has no other complaints. He is here today for evaluation before starting cycle #15.. ? ? ?MEDICAL HISTORY: ?Past Medical History:  ?Diagnosis Date  ? Chronic back pain   ? Eczema   ? GERD (gastroesophageal reflux disease)   ? Lung cancer ([facility])   ? ? ?ALLERGIES:  is allergic to penicillins and zofran [ondansetron]. ? ?MEDICATIONS:  ?Current Outpatient Medications  ?Medication Sig Dispense Refill  ? acetaminophen (TYLENOL) 500 MG tablet Take 500-1,000 mg by mouth every 6 (six) hours as needed (for back pain.).    ? folic acid (FOLVITE) 1 MG tablet Take 1 tablet (1 mg total) by mouth daily. 30 tablet 4  ? lidocaine-prilocaine (EMLA) cream Apply 1 application topically as needed. 30 g 2  ? magic mouthwash SOLN Take 5 mLs by mouth 3 (three) times daily as needed for mouth pain. 240 mL 0  ? prochlorperazine (COMPAZINE) 10 MG tablet Take 1 tablet (  10 mg total) by mouth every 6 (six) hours as needed for nausea or vomiting. 30 tablet 0  ? traMADol (ULTRAM) 50 MG tablet Take 50 mg by mouth every 6 (six) hours as needed for severe pain.    ? zolpidem (AMBIEN)  10 MG tablet Take 5-10 mg by mouth at bedtime as needed for sleep.    ? ondansetron (ZOFRAN-ODT) 4 MG disintegrating tablet Take 1 tablet (4 mg total) by mouth every 8 (eight) hours as needed for nausea or vomiting. (Patient not taking: Reported on 03/15/2022) 30 tablet 2  ? ?No current facility-administered medications for this visit.  ? ? ?SURGICAL HISTORY:  ?Past Surgical History:  ?Procedure Laterality Date  ? BRONCHIAL BRUSHINGS  05/12/2021  ? Procedure: BRONCHIAL BRUSHINGS;  Surgeon: Garner Nash, DO;  Location: [facility];  Service: Pulmonary;;  ? BRONCHIAL DILITATION  05/12/2021  ? Procedure: BRONCHIAL DILITATION;  Surgeon: Garner Nash, DO;  Location: [facility];  Service: Pulmonary;;  ? BRONCHIAL NEEDLE ASPIRATION BIOPSY  05/12/2021  ? Procedure: BRONCHIAL NEEDLE ASPIRATION BIOPSIES;  Surgeon: Garner Nash, DO;  Location: [facility];  Service: Pulmonary;;  ? BRONCHIAL WASHINGS  05/12/2021  ? Procedure: BRONCHIAL WASHINGS;  Surgeon: Garner Nash, DO;  Location: [facility];  Service: Pulmonary;;  ? CRYOTHERAPY  05/12/2021  ? Procedure: CRYOTHERAPY;  Surgeon: Garner Nash, DO;  Location: [facility];  Service: Pulmonary;;  ? HEMOSTASIS CONTROL  05/12/2021  ? Procedure: HEMOSTASIS CONTROL;  Surgeon: Garner Nash, DO;  Location: [facility];  Service: Pulmonary;;  epi injection  ? IR IMAGING GUIDED PORT INSERTION  06/29/2021  ? Spine injection    ? Pain control  ? VIDEO BRONCHOSCOPY WITH ENDOBRONCHIAL ULTRASOUND N/A 05/12/2021  ? Procedure: VIDEO BRONCHOSCOPY WITH ENDOBRONCHIAL ULTRASOUND;  Surgeon: Garner Nash, DO;  Location: [facility];  Service: Pulmonary;  Laterality: N/A;  ? WISDOM TOOTH EXTRACTION    ? no anesthesia involed  ? ?REVIEW OF SYSTEMS:   ?Constitutional: Negative for appetite change, fatigue, chills, fever and unexpected weight change ?HENT: Negative for mouth sores, nosebleeds, sore throat and trouble swallowing.   ?Eyes: Negative for eye problems and icterus.   ?Respiratory: Negative for cough, hemoptysis, shortness of breath and wheezing.   ?Cardiovascular: Negative for chest pain and leg swelling.  ?Gastrointestinal: Negative for abdominal pain, constipation, diarrhea, nausea and vomiting.  ?Genitourinary: Negative for bladder incontinence, difficulty urinating, dysuria, frequency and hematuria.   ?Musculoskeletal: Negative for back pain, gait problem, neck pain and neck stiffness.  ?Skin:Negative for rash and ulcers ?Neurological: Negative for dizziness, extremity weakness, gait problem, headaches, light-headedness and seizures.  ?Hematological: Negative for adenopathy. Does not bruise/bleed easily.  ?Psychiatric/Behavioral: Negative for confusion, depression and sleep disturbance. The patient is not nervous/anxious.   ? ?PHYSICAL EXAM:  ? ?ECOG PERFORMANCE STATUS: 1 - Symptomatic but completely ambulatory ? ?Blood pressure 140/78, pulse 78, temperature 97.9 ?F (36.6 ?C), temperature source Temporal, resp. rate 17, height 5' 9" (1.753 m), weight 170 lb 9.6 oz (77.4 kg), SpO2 100 %. ? ?Constitutional: Oriented to person, place, and time and well-developed, well-nourished, and in no distress.  ?HENT:  ?Head: Normocephalic and atraumatic.  ?Eyes: Conjunctivae are normal. Right eye exhibits no discharge. Left eye exhibits no discharge. No scleral icterus.  ?Neck: Normal range of motion. Neck supple.   ?Cardiovascular: Normal rate, regular rhythm, normal heart sounds and intact distal pulses.   ?Pulmonary/Chest: Effort normal and breath sounds normal. No respiratory distress. No wheezes. No rales.  ?Abdominal: Soft.  Bowel sounds are normal. Exhibits no distension and no mass. There is no tenderness.  ?Musculoskeletal: Normal range of motion. Exhibits no edema.  ?Neurological: Alert and oriented to person, place, and time. Gait normal. Coordination normal.  ?Skin: Skin is warm and dry. No rash noted. Not diaphoretic. No erythema. No pallor.  ?Psychiatric: Mood, memory and  judgment normal.  ? ?LABORATORY DATA: ?Lab Results  ?Component Value Date  ? WBC 9.2 04/05/2022  ? HGB 9.1 (L) 04/05/2022  ? HCT 27.7 (L) 04/05/2022  ? MCV 95.8 04/05/2022  ? PLT 293 04/05/2022  ? ? ?  Ch

## 2022-04-10 ENCOUNTER — Other Ambulatory Visit: Payer: Self-pay | Admitting: Medical Oncology

## 2022-04-10 DIAGNOSIS — C3432 Malignant neoplasm of lower lobe, left bronchus or lung: Secondary | ICD-10-CM

## 2022-04-10 DIAGNOSIS — C3490 Malignant neoplasm of unspecified part of unspecified bronchus or lung: Secondary | ICD-10-CM

## 2022-04-23 ENCOUNTER — Encounter: Payer: Self-pay | Admitting: Physician Assistant

## 2022-04-23 ENCOUNTER — Encounter: Payer: Self-pay | Admitting: Internal Medicine

## 2022-04-23 ENCOUNTER — Ambulatory Visit (HOSPITAL_COMMUNITY)
Admission: RE | Admit: 2022-04-23 | Discharge: 2022-04-23 | Disposition: A | Discharge status: Home / Self Care | Payer: 59 | Source: Ambulatory Visit | Attending: Internal Medicine | Admitting: Internal Medicine

## 2022-04-23 ENCOUNTER — Encounter (HOSPITAL_COMMUNITY): Payer: Self-pay

## 2022-04-23 DIAGNOSIS — C3490 Malignant neoplasm of unspecified part of unspecified bronchus or lung: Secondary | ICD-10-CM | POA: Diagnosis present

## 2022-04-23 MED ORDER — SODIUM CHLORIDE (PF) 0.9 % IJ SOLN
INTRAMUSCULAR | Status: AC
  Filled 2022-04-23: qty 50

## 2022-04-23 MED ORDER — HEPARIN SOD (PORK) LOCK FLUSH 100 UNIT/ML IV SOLN
INTRAVENOUS | Status: AC
  Administered 2022-04-23: 500 [IU]
  Filled 2022-04-23: qty 5

## 2022-04-23 MED ORDER — IOHEXOL 300 MG/ML  SOLN
80.0000 mL | Freq: Once | INTRAMUSCULAR | Status: AC | PRN
  Administered 2022-04-23: 80 mL via INTRAVENOUS

## 2022-04-24 NOTE — Progress Notes (Unsigned)
Cameron Harper OFFICE PROGRESS NOTE  [name], MD [place] [address]  DIAGNOSIS: Stage IV (T2b, N2, M1 B) non-small cell lung cancer, adenocarcinoma presented with large central left lower lobe lung mass with left hilar and mediastinal lymphadenopathy as well as abdominal retroperitoneal lymphadenopathy diagnosed in May 2022.  The patient also has bilateral parotid gland nodules that need close monitoring.   [facility] MOLECULAR STUDY: Results with Therapy Associations BIOMARKER METHOD ANALYTE RESULT THERAPY ASSOCIATION BIOMARKER LEVEL* .PD-L1 (22c3) IHC Protein Positive, TPS: 50% BENEFIT cemiplimab, pembrolizumab Level 1 .PD-L1 (28-8) IHC Protein Positive  1+, 50% BENEFIT nivolumab/ipilimumab combination Level 1 .TMB Seq DNA-Tumor High, 18 mut/Mb BENEFIT pembrolizumab Level 2 . alectinib, ceritinib, crizotinib, lorlatinib Level 1 . IHC Protein Negative  0 brigatinib Level 2 . ALK Seq RNA-Tumor Fusion Not Detected LACK OF BENEFIT alectinib, brigatinib, ceritinib, crizotinib, lorlatinib Level 2 .BRAF Seq DNA-Tumor Mutation Not Detected LACK OF BENEFIT dabrafenib and trametinib combination therapy, vemurafenib Level 2 .EGFR Seq DNA-Tumor Mutation Not Detected LACK OF BENEFIT erlotinib, gefitinib Level 2 .KRAS Seq DNA-Tumor Mutation Not Detected LACK OF BENEFIT sotorasib Level 2 .RET Seq RNA-Tumor Fusion Not Detected LACK OF BENEFIT pralsetinib, selpercatinib Level 2 .ROS1 Seq RNA-Tumor Fusion Not Detected LACK OF BENEFIT ceritinib, crizotinib, entrectinib, lorlatinib Level 2 . CNA-Seq DNA-Tumor Amplification Not Detected . MET Seq RNA-Tumor Variant Transcript Not Detected LACK OF BENEFIT crizotinib Level 3  PRIOR THERAPY: Palliative radiotherapy to the large central left lower lobe lung mass under the care of Dr. Lisbeth Renshaw. Last treatment on 06/12/21.  CURRENT THERAPY: Systemic chemotherapy with carboplatin for AUC of 5, Alimta 500 Mg/M2  and Keytruda 200 Mg IV every 3 weeks.  First dose expected June 14, 2021.  Status post 15 cycles of treatment.  Starting from cycle #5, the patient will begin maintenance Keytruda and Alimta.  Alimta was reduced to 400 mg per metered squared starting from cycle number 12 due to fatigue.  Alimta discontinued starting from cycle #13 due to renal insufficiency ***Remove from care plan.   INTERVAL HISTORY: Cameron Harper 46 y.o. male returns  to the clinic today for a follow-up visit.  The patient is feeling fairly well today without any concerning complaints.  His Alimta has been on hold for the last 2 cycles of treatment due to elevated creatinine and fatigue.  He has been tolerating his treatment easier with single agent immunotherapy with Keytruda.  Is feeling fairly well today except for fatigue.  He denies any fever, chills, or night sweats.  Weight loss?  Appetite?  He has a mild intermittent cough but nothing out of the ordinary.  He has his baseline shortness of breath only with strenuous activities.  Denies any chest pain or hemoptysis.  Denies any nausea, vomiting, diarrhea, or constipation.  Denies any headache or visual changes?  Mild frontal headaches that resolved with Tylenol?  He denies any rashes or skin changes.  The patient recently had a restaging CT scan performed.  He is here today for evaluation to review his scan results before starting cycle #16.    MEDICAL HISTORY: Past Medical History:  Diagnosis Date   Chronic back pain    Eczema    GERD (gastroesophageal reflux disease)    Lung cancer (HCC)     ALLERGIES:  is allergic to penicillins and zofran [ondansetron].  MEDICATIONS:  Current Outpatient Medications  Medication Sig Dispense Refill   acetaminophen (TYLENOL) 500 MG tablet Take 500-1,000 mg by mouth every 6 (six)  hours as needed (for back pain.).     folic acid (FOLVITE) 1 MG tablet Take 1 tablet (1 mg total) by mouth daily. 30 tablet 4   lidocaine-prilocaine  (EMLA) cream Apply 1 application topically as needed. 30 g 2   magic mouthwash SOLN Take 5 mLs by mouth 3 (three) times daily as needed for mouth pain. 240 mL 0   prochlorperazine (COMPAZINE) 10 MG tablet Take 1 tablet (10 mg total) by mouth every 6 (six) hours as needed for nausea or vomiting. 30 tablet 0   traMADol (ULTRAM) 50 MG tablet Take 50 mg by mouth every 6 (six) hours as needed for severe pain.     zolpidem (AMBIEN) 10 MG tablet Take 5-10 mg by mouth at bedtime as needed for sleep.     No current facility-administered medications for this visit.    SURGICAL HISTORY:  Past Surgical History:  Procedure Laterality Date   BRONCHIAL BRUSHINGS  05/12/2021   Procedure: BRONCHIAL BRUSHINGS;  Surgeon: Garner Nash, DO;  Location: [facility];  Service: Pulmonary;;   BRONCHIAL DILITATION  05/12/2021   Procedure: BRONCHIAL DILITATION;  Surgeon: Garner Nash, DO;  Location: [facility];  Service: Pulmonary;;   BRONCHIAL NEEDLE ASPIRATION BIOPSY  05/12/2021   Procedure: BRONCHIAL NEEDLE ASPIRATION BIOPSIES;  Surgeon: Garner Nash, DO;  Location: [facility];  Service: Pulmonary;;   BRONCHIAL WASHINGS  05/12/2021   Procedure: BRONCHIAL WASHINGS;  Surgeon: Garner Nash, DO;  Location: [facility];  Service: Pulmonary;;   CRYOTHERAPY  05/12/2021   Procedure: CRYOTHERAPY;  Surgeon: Garner Nash, DO;  Location: [facility];  Service: Pulmonary;;   HEMOSTASIS CONTROL  05/12/2021   Procedure: HEMOSTASIS CONTROL;  Surgeon: Garner Nash, DO;  Location: [facility];  Service: Pulmonary;;  epi injection   IR IMAGING GUIDED PORT INSERTION  06/29/2021   Spine injection     Pain control   VIDEO BRONCHOSCOPY WITH ENDOBRONCHIAL ULTRASOUND N/A 05/12/2021   Procedure: VIDEO BRONCHOSCOPY WITH ENDOBRONCHIAL ULTRASOUND;  Surgeon: Garner Nash, DO;  Location: [facility];  Service: Pulmonary;  Laterality: N/A;   WISDOM TOOTH EXTRACTION     no anesthesia involed    REVIEW OF  SYSTEMS:   Review of Systems  Constitutional: Negative for appetite change, chills, fatigue, fever and unexpected weight change.  HENT:   Negative for mouth sores, nosebleeds, sore throat and trouble swallowing.   Eyes: Negative for eye problems and icterus.  Respiratory: Negative for cough, hemoptysis, shortness of breath and wheezing.   Cardiovascular: Negative for chest pain and leg swelling.  Gastrointestinal: Negative for abdominal pain, constipation, diarrhea, nausea and vomiting.  Genitourinary: Negative for bladder incontinence, difficulty urinating, dysuria, frequency and hematuria.   Musculoskeletal: Negative for back pain, gait problem, neck pain and neck stiffness.  Skin: Negative for itching and rash.  Neurological: Negative for dizziness, extremity weakness, gait problem, headaches, light-headedness and seizures.  Hematological: Negative for adenopathy. Does not bruise/bleed easily.  Psychiatric/Behavioral: Negative for confusion, depression and sleep disturbance. The patient is not nervous/anxious.     PHYSICAL EXAMINATION:  There were no vitals taken for this visit.  ECOG PERFORMANCE STATUS: {CHL ONC ECOG [id number]  Physical Exam  Constitutional: Oriented to person, place, and time and well-developed, well-nourished, and in no distress. No distress.  HENT:  Head: Normocephalic and atraumatic.  Mouth/Throat: Oropharynx is clear and moist. No oropharyngeal exudate.  Eyes: Conjunctivae are normal. Right eye exhibits no discharge. Left eye exhibits no discharge. No scleral icterus.  Neck:  Normal range of motion. Neck supple.  Cardiovascular: Normal rate, regular rhythm, normal heart sounds and intact distal pulses.   Pulmonary/Chest: Effort normal and breath sounds normal. No respiratory distress. No wheezes. No rales.  Abdominal: Soft. Bowel sounds are normal. Exhibits no distension and no mass. There is no tenderness.  Musculoskeletal: Normal range of motion.  Exhibits no edema.  Lymphadenopathy:    No cervical adenopathy.  Neurological: Alert and oriented to person, place, and time. Exhibits normal muscle tone. Gait normal. Coordination normal.  Skin: Skin is warm and dry. No rash noted. Not diaphoretic. No erythema. No pallor.  Psychiatric: Mood, memory and judgment normal.  Vitals reviewed.  LABORATORY DATA: Lab Results  Component Value Date   WBC 9.2 04/05/2022   HGB 9.1 (L) 04/05/2022   HCT 27.7 (L) 04/05/2022   MCV 95.8 04/05/2022   PLT 293 04/05/2022      Chemistry      Component Value Date/Time   NA 140 04/05/2022 1020   K 3.9 04/05/2022 1020   CL 106 04/05/2022 1020   CO2 27 04/05/2022 1020   BUN 20 04/05/2022 1020   CREATININE 1.78 (H) 04/05/2022 1020      Component Value Date/Time   CALCIUM 9.0 04/05/2022 1020   ALKPHOS 95 04/05/2022 1020   AST 12 (L) 04/05/2022 1020   ALT 9 04/05/2022 1020   BILITOT 0.2 (L) 04/05/2022 1020       RADIOGRAPHIC STUDIES:  CT Chest W Contrast  Result Date: 04/23/2022 CLINICAL DATA:  Non-small cell lung cancer.  Restaging. EXAM: CT CHEST, ABDOMEN, AND PELVIS WITH CONTRAST TECHNIQUE: Multidetector CT imaging of the chest, abdomen and pelvis was performed following the standard protocol during bolus administration of intravenous contrast. RADIATION DOSE REDUCTION: This exam was performed according to the departmental dose-optimization program which includes automated exposure control, adjustment of the mA and/or kV according to patient size and/or use of iterative reconstruction technique. CONTRAST:  63m OMNIPAQUE IOHEXOL 300 MG/ML  SOLN COMPARISON:  02/19/2022 FINDINGS: CT CHEST FINDINGS Cardiovascular: Heart size is normal. No pericardial effusion identified. Aortic atherosclerosis. Mediastinum/Nodes: No enlarged mediastinal, hilar, or axillary lymph nodes. Soft tissue thickening about the left hilum with stranding in the left mediastinal fat appears stable from previous exam compatible  with changes secondary to external beam radiation. Thyroid gland, trachea, and esophagus demonstrate no significant findings. Lungs/Pleura: Mild changes of emphysema. New tiny peripheral nodule within the right lower lobe measures 5 mm, image 108/4. This is nonspecific and may be postinflammatory or infectious in etiology. Small loculated left pleural effusion is stable from the previous exam. Paramediastinal and perihilar fibrosis is again noted within the left lung compatible with changes secondary to external beam radiation. This appears unchanged in the interval. Musculoskeletal: No chest wall mass or suspicious bone lesions identified. CT ABDOMEN PELVIS FINDINGS Hepatobiliary: No focal liver abnormality is seen. No gallstones, gallbladder wall thickening, or biliary dilatation. Pancreas: Unremarkable. No pancreatic ductal dilatation or surrounding inflammatory changes. Spleen: Normal in size without focal abnormality. Adrenals/Urinary Tract: Normal adrenal glands. Stable appearance of Bosniak class 2 cyst arising off the upper pole of the left kidney measuring 2.5 cm, image 14/7. No follow-up recommended. No suspicious mass or hydronephrosis. Urinary bladder is unremarkable. Stomach/Bowel: Stomach appears normal. The appendix is visualized and appears within normal limits. Sigmoid diverticulosis without signs of acute diverticulitis. Vascular/Lymphatic: Mild aortic atherosclerosis. No aneurysm. No signs of abdominopelvic adenopathy. Reproductive: Prostate is unremarkable. Other: No free fluid or fluid collections. Musculoskeletal: No acute or  significant osseous findings. IMPRESSION: 1. Unchanged post treatment/radiation appearance of the left lung. Stable loculated left pleural effusion. 2. Unchanged appearance of soft tissue thickening about the left hilum and left mediastinal fat. No enlarged mediastinal or hilar lymph nodes identified. 3. No signs of distant metastatic disease. 4. Aortic Atherosclerosis  (ICD10-I70.0) and Emphysema (ICD10-J43.9). * Tracking Code: BO * Electronically Signed   By: Kerby Moors M.D.   On: 04/23/2022 16:00   CT Abdomen Pelvis W Contrast  Result Date: 04/23/2022 CLINICAL DATA:  Non-small cell lung cancer.  Restaging. EXAM: CT CHEST, ABDOMEN, AND PELVIS WITH CONTRAST TECHNIQUE: Multidetector CT imaging of the chest, abdomen and pelvis was performed following the standard protocol during bolus administration of intravenous contrast. RADIATION DOSE REDUCTION: This exam was performed according to the departmental dose-optimization program which includes automated exposure control, adjustment of the mA and/or kV according to patient size and/or use of iterative reconstruction technique. CONTRAST:  15m OMNIPAQUE IOHEXOL 300 MG/ML  SOLN COMPARISON:  02/19/2022 FINDINGS: CT CHEST FINDINGS Cardiovascular: Heart size is normal. No pericardial effusion identified. Aortic atherosclerosis. Mediastinum/Nodes: No enlarged mediastinal, hilar, or axillary lymph nodes. Soft tissue thickening about the left hilum with stranding in the left mediastinal fat appears stable from previous exam compatible with changes secondary to external beam radiation. Thyroid gland, trachea, and esophagus demonstrate no significant findings. Lungs/Pleura: Mild changes of emphysema. New tiny peripheral nodule within the right lower lobe measures 5 mm, image 108/4. This is nonspecific and may be postinflammatory or infectious in etiology. Small loculated left pleural effusion is stable from the previous exam. Paramediastinal and perihilar fibrosis is again noted within the left lung compatible with changes secondary to external beam radiation. This appears unchanged in the interval. Musculoskeletal: No chest wall mass or suspicious bone lesions identified. CT ABDOMEN PELVIS FINDINGS Hepatobiliary: No focal liver abnormality is seen. No gallstones, gallbladder wall thickening, or biliary dilatation. Pancreas:  Unremarkable. No pancreatic ductal dilatation or surrounding inflammatory changes. Spleen: Normal in size without focal abnormality. Adrenals/Urinary Tract: Normal adrenal glands. Stable appearance of Bosniak class 2 cyst arising off the upper pole of the left kidney measuring 2.5 cm, image 14/7. No follow-up recommended. No suspicious mass or hydronephrosis. Urinary bladder is unremarkable. Stomach/Bowel: Stomach appears normal. The appendix is visualized and appears within normal limits. Sigmoid diverticulosis without signs of acute diverticulitis. Vascular/Lymphatic: Mild aortic atherosclerosis. No aneurysm. No signs of abdominopelvic adenopathy. Reproductive: Prostate is unremarkable. Other: No free fluid or fluid collections. Musculoskeletal: No acute or significant osseous findings. IMPRESSION: 1. Unchanged post treatment/radiation appearance of the left lung. Stable loculated left pleural effusion. 2. Unchanged appearance of soft tissue thickening about the left hilum and left mediastinal fat. No enlarged mediastinal or hilar lymph nodes identified. 3. No signs of distant metastatic disease. 4. Aortic Atherosclerosis (ICD10-I70.0) and Emphysema (ICD10-J43.9). * Tracking Code: BO * Electronically Signed   By: TKerby MoorsM.D.   On: 04/23/2022 16:00     ASSESSMENT/PLAN:  This is a very pleasant 46year old Caucasian male diagnosed with stage IV (T2b, N2, M1 B) non-small cell lung cancer, adenocarcinoma.  He presented with a large central left lower lobe lung mass in addition to left hilar mediastinal lymphadenopathy.  He also has retroperitoneal abdominal lymphadenopathy.  He was diagnosed in May 2022.  His PD-L1 expression is 50%.  His molecular studies by [facility] did not show any evidence of any actionable mutations.  He underwent palliative radiotherapy to the large left lower lobe lung mass under the care of  Dr. Lisbeth Renshaw.  He completed this on 06/12/2021.   The patient is currently undergoing systemic  chemotherapy with carboplatin for an AUC of 5, Alimta 500 mg per metered squared, and Keytruda 200 mg IV every 3 weeks.  He is status post 15 cycles.  Starting from cycle #5, the patient has been on maintenance Alimta and Keytruda.  The dose of Alimta was reduced to 400 mg per metered squared starting from cycle #12 due to fatigue.  Alimta was removed from the treatment plan starting from cycle #13 due to renal insufficiency. ***Remove today***  Patient recently had a restaging CT scan performed.  Dr. Julien Nordmann personally and independently reviewed the scan and discussed the results with the patient today.  The scan showed ***Dr. Julien Nordmann recommends that he proceed with cycle #16 today scheduled with ***Alimta and Keytruda versus single agent Keytruda???.  We will see him back for follow-up visit in 3 weeks for evaluation and repeat blood work before starting cycle #17.  The patient was advised to call immediately if she has any concerning symptoms in the interval. The patient voices understanding of current disease status and treatment options and is in agreement with the current care plan. All questions were answered. The patient knows to call the clinic with any problems, questions or concerns. We can certainly see the patient much sooner if necessary          No orders of the defined types were placed in this encounter.    I spent {CHL ONC TIME VISIT - LYHTM:[id number]} counseling the patient face to face. The total time spent in the appointment was {CHL ONC TIME VISIT - CXFQH:[id number]}.  Allyanna Appleman L Halil Rentz, PA-C 04/24/22

## 2022-04-26 ENCOUNTER — Other Ambulatory Visit: Payer: Self-pay

## 2022-04-26 ENCOUNTER — Telehealth: Payer: Self-pay

## 2022-04-26 ENCOUNTER — Inpatient Hospital Stay: Payer: 59

## 2022-04-26 ENCOUNTER — Inpatient Hospital Stay: Payer: 59 | Admitting: Physician Assistant

## 2022-04-26 VITALS — BP 144/80 | HR 84 | Temp 97.8°F | Resp 17 | Ht 69.0 in | Wt 170.1 lb

## 2022-04-26 DIAGNOSIS — Z95828 Presence of other vascular implants and grafts: Secondary | ICD-10-CM

## 2022-04-26 DIAGNOSIS — Z5112 Encounter for antineoplastic immunotherapy: Secondary | ICD-10-CM | POA: Diagnosis not present

## 2022-04-26 DIAGNOSIS — C3432 Malignant neoplasm of lower lobe, left bronchus or lung: Secondary | ICD-10-CM

## 2022-04-26 DIAGNOSIS — N289 Disorder of kidney and ureter, unspecified: Secondary | ICD-10-CM | POA: Insufficient documentation

## 2022-04-26 LAB — CBC WITH DIFFERENTIAL (CANCER CENTER ONLY)
Abs Immature Granulocytes: 0.03 10*3/uL (ref 0.00–0.07)
Basophils Absolute: 0 10*3/uL (ref 0.0–0.1)
Basophils Relative: 1 %
Eosinophils Absolute: 0.2 10*3/uL (ref 0.0–0.5)
Eosinophils Relative: 2 %
HCT: 27.5 % — ABNORMAL LOW (ref 39.0–52.0)
Hemoglobin: 9 g/dL — ABNORMAL LOW (ref 13.0–17.0)
Immature Granulocytes: 0 %
Lymphocytes Relative: 12 %
Lymphs Abs: 0.8 10*3/uL (ref 0.7–4.0)
MCH: 31.1 pg (ref 26.0–34.0)
MCHC: 32.7 g/dL (ref 30.0–36.0)
MCV: 95.2 fL (ref 80.0–100.0)
Monocytes Absolute: 0.8 10*3/uL (ref 0.1–1.0)
Monocytes Relative: 11 %
Neutro Abs: 5.2 10*3/uL (ref 1.7–7.7)
Neutrophils Relative %: 74 %
Platelet Count: 274 10*3/uL (ref 150–400)
RBC: 2.89 MIL/uL — ABNORMAL LOW (ref 4.22–5.81)
RDW: 13.8 % (ref 11.5–15.5)
WBC Count: 7.1 10*3/uL (ref 4.0–10.5)
nRBC: 0 % (ref 0.0–0.2)

## 2022-04-26 LAB — TSH: TSH: 2.942 u[IU]/mL (ref 0.350–4.500)

## 2022-04-26 LAB — CMP (CANCER CENTER ONLY)
ALT: 8 U/L (ref 0–44)
AST: 11 U/L — ABNORMAL LOW (ref 15–41)
Albumin: 3.4 g/dL — ABNORMAL LOW (ref 3.5–5.0)
Alkaline Phosphatase: 89 U/L (ref 38–126)
Anion gap: 6 (ref 5–15)
BUN: 21 mg/dL — ABNORMAL HIGH (ref 6–20)
CO2: 26 mmol/L (ref 22–32)
Calcium: 8.9 mg/dL (ref 8.9–10.3)
Chloride: 108 mmol/L (ref 98–111)
Creatinine: 1.83 mg/dL — ABNORMAL HIGH (ref 0.61–1.24)
GFR, Estimated: 45 mL/min — ABNORMAL LOW (ref >60)
Glucose, Bld: 117 mg/dL — ABNORMAL HIGH (ref 70–99)
Potassium: 3.8 mmol/L (ref 3.5–5.1)
Sodium: 140 mmol/L (ref 135–145)
Total Bilirubin: 0.2 mg/dL — ABNORMAL LOW (ref 0.3–1.2)
Total Protein: 7.3 g/dL (ref 6.5–8.1)

## 2022-04-26 LAB — SAMPLE TO BLOOD BANK

## 2022-04-26 MED ORDER — CYANOCOBALAMIN 1000 MCG/ML IJ SOLN
1000.0000 ug | Freq: Once | INTRAMUSCULAR | Status: AC
  Administered 2022-04-26: 1000 ug via INTRAMUSCULAR
  Filled 2022-04-26: qty 1

## 2022-04-26 MED ORDER — SODIUM CHLORIDE 0.9% FLUSH
10.0000 mL | INTRAVENOUS | Status: DC | PRN
  Administered 2022-04-26: 10 mL

## 2022-04-26 MED ORDER — SODIUM CHLORIDE 0.9 % IV SOLN
200.0000 mg | Freq: Once | INTRAVENOUS | Status: AC
  Administered 2022-04-26: 200 mg via INTRAVENOUS
  Filled 2022-04-26: qty 8

## 2022-04-26 MED ORDER — SODIUM CHLORIDE 0.9% FLUSH
10.0000 mL | Freq: Once | INTRAVENOUS | Status: AC
  Administered 2022-04-26: 10 mL

## 2022-04-26 MED ORDER — SODIUM CHLORIDE 0.9 % IV SOLN: Freq: Once | INTRAVENOUS | Status: AC

## 2022-04-26 MED ORDER — HEPARIN SOD (PORK) LOCK FLUSH 100 UNIT/ML IV SOLN
500.0000 [IU] | Freq: Once | INTRAVENOUS | Status: AC | PRN
  Administered 2022-04-26: 500 [IU]

## 2022-04-26 NOTE — Progress Notes (Signed)
Per Cassie, ok to treat with creat 1.83 mg/dL today.  Also, pt is only receiving Keytruda today and opting not to take compazine.  Will proceed with B12 injection today

## 2022-04-26 NOTE — Patient Instructions (Signed)
[facility]  Discharge Instructions: ?Thank you for choosing [facility] to provide your oncology and hematology care.  ? ?If you have a lab appointment with the Cancer Center, please go directly to the Cancer Center and check in at the registration area. ?  ?Wear comfortable clothing and clothing appropriate for easy access to any Portacath or PICC line.  ? ?We strive to give you quality time with your provider. You may need to reschedule your appointment if you arrive late (15 or more minutes).  Arriving late affects you and other patients whose appointments are after yours.  Also, if you miss three or more appointments without notifying the office, you may be dismissed from the clinic at the provider?s discretion.    ?  ?For prescription refill requests, have your pharmacy contact our office and allow 72 hours for refills to be completed.   ? ?Today you received the following chemotherapy and/or immunotherapy agents: Keytruda ?  ?To help prevent nausea and vomiting after your treatment, we encourage you to take your nausea medication as directed. ? ?BELOW ARE SYMPTOMS THAT SHOULD BE REPORTED IMMEDIATELY: ?*FEVER GREATER THAN 100.4 F (38 ?C) OR HIGHER ?*CHILLS OR SWEATING ?*NAUSEA AND VOMITING THAT IS NOT CONTROLLED WITH YOUR NAUSEA MEDICATION ?*UNUSUAL SHORTNESS OF BREATH ?*UNUSUAL BRUISING OR BLEEDING ?*URINARY PROBLEMS (pain or burning when urinating, or frequent urination) ?*BOWEL PROBLEMS (unusual diarrhea, constipation, pain near the anus) ?TENDERNESS IN MOUTH AND THROAT WITH OR WITHOUT PRESENCE OF ULCERS (sore throat, sores in mouth, or a toothache) ?UNUSUAL RASH, SWELLING OR PAIN  ?UNUSUAL VAGINAL DISCHARGE OR ITCHING  ? ?Items with * indicate a potential emergency and should be followed up as soon as possible or go to the Emergency Department if any problems should occur. ? ?Please show the CHEMOTHERAPY ALERT CARD or IMMUNOTHERAPY ALERT CARD at check-in to the  Emergency Department and triage nurse. ? ?Should you have questions after your visit or need to cancel or reschedule your appointment, please contact [facility]  Dept: [phone]  and follow the prompts.  Office hours are 8:00 a.m. to 4:30 p.m. Monday - Friday. Please note that voicemails left after 4:00 p.m. may not be returned until the following business day.  We are closed weekends and major holidays. You have access to a nurse at all times for urgent questions. Please call the main number to the clinic Dept: [phone] and follow the prompts. ? ? ?For any non-urgent questions, you may also contact your provider using MyChart. We now offer e-Visits for anyone 18 and older to request care online for non-urgent symptoms. For details visit [email or web address]. ?  ?Also download the MyChart app! Go to the app store, search "MyChart", open the app, select [facility], and log in with your MyChart username and password. ? ?Due to Covid, a mask is required upon entering the hospital/clinic. If you do not have a mask, one will be given to you upon arrival. For doctor visits, patients may have 1 support person aged 18 or older with them. For treatment visits, patients cannot have anyone with them due to current Covid guidelines and our immunocompromised population.  ? ?

## 2022-04-26 NOTE — Telephone Encounter (Signed)
Referral has been sent to [facility].   PH: [phone] FX: [phone]   Referral Facesheet Progress note Labs   Confirmation was received.

## 2022-05-03 ENCOUNTER — Telehealth: Payer: Self-pay

## 2022-05-03 NOTE — Telephone Encounter (Signed)
Pt called wanting to know where he was referred to for Nephrology because he hasn't heard from them as of yet. Pt was advised that he was referred to [facility] on 04/26/22. I have provided him with their contact number.

## 2022-05-04 ENCOUNTER — Encounter: Payer: Self-pay | Admitting: Physician Assistant

## 2022-05-04 ENCOUNTER — Encounter: Payer: Self-pay | Admitting: Internal Medicine

## 2022-05-12 ENCOUNTER — Other Ambulatory Visit: Payer: Self-pay | Admitting: Internal Medicine

## 2022-05-12 DIAGNOSIS — C3432 Malignant neoplasm of lower lobe, left bronchus or lung: Secondary | ICD-10-CM

## 2022-05-13 ENCOUNTER — Encounter: Payer: Self-pay | Admitting: Physician Assistant

## 2022-05-13 ENCOUNTER — Encounter: Payer: Self-pay | Admitting: Internal Medicine

## 2022-05-14 ENCOUNTER — Other Ambulatory Visit: Payer: Self-pay | Admitting: Medical Oncology

## 2022-05-14 ENCOUNTER — Encounter: Payer: Self-pay | Admitting: Internal Medicine

## 2022-05-14 DIAGNOSIS — C3432 Malignant neoplasm of lower lobe, left bronchus or lung: Secondary | ICD-10-CM

## 2022-05-14 MED ORDER — FOLIC ACID 1 MG PO TABS: 1.0000 mg | ORAL_TABLET | Freq: Every day | ORAL | 4 refills | Status: DC

## 2022-05-14 NOTE — Progress Notes (Signed)
Pt called to inquire about financial assistance.  I informed him of copay assistance w/ the [employer] and he would like to apply so I completed the [employer] for [place], will get the pt's and Dr. Worthy Flank signature and will fax for processing.  I will notify the pt of the outcome once received.  I also informed him of the [employer], went over what it covers and gave him the income requirement.  Pt is overqualified for that grant.  I will give him my card for any questions or concerns he may have in the future.

## 2022-05-17 ENCOUNTER — Encounter: Payer: Self-pay | Admitting: Internal Medicine

## 2022-05-17 ENCOUNTER — Inpatient Hospital Stay (HOSPITAL_BASED_OUTPATIENT_CLINIC_OR_DEPARTMENT_OTHER): Payer: 59 | Admitting: Internal Medicine

## 2022-05-17 ENCOUNTER — Inpatient Hospital Stay: Payer: 59 | Attending: Physician Assistant

## 2022-05-17 ENCOUNTER — Inpatient Hospital Stay: Payer: 59

## 2022-05-17 VITALS — BP 125/74 | HR 79 | Temp 97.9°F | Resp 18 | Wt 171.4 lb

## 2022-05-17 DIAGNOSIS — Z95828 Presence of other vascular implants and grafts: Secondary | ICD-10-CM

## 2022-05-17 DIAGNOSIS — C3432 Malignant neoplasm of lower lobe, left bronchus or lung: Secondary | ICD-10-CM

## 2022-05-17 DIAGNOSIS — Z5112 Encounter for antineoplastic immunotherapy: Secondary | ICD-10-CM | POA: Insufficient documentation

## 2022-05-17 DIAGNOSIS — R5383 Other fatigue: Secondary | ICD-10-CM | POA: Diagnosis not present

## 2022-05-17 LAB — CMP (CANCER CENTER ONLY)
ALT: 10 U/L (ref 0–44)
AST: 11 U/L — ABNORMAL LOW (ref 15–41)
Albumin: 3.6 g/dL (ref 3.5–5.0)
Alkaline Phosphatase: 93 U/L (ref 38–126)
Anion gap: 5 (ref 5–15)
BUN: 18 mg/dL (ref 6–20)
CO2: 28 mmol/L (ref 22–32)
Calcium: 9.6 mg/dL (ref 8.9–10.3)
Chloride: 107 mmol/L (ref 98–111)
Creatinine: 1.75 mg/dL — ABNORMAL HIGH (ref 0.61–1.24)
GFR, Estimated: 47 mL/min — ABNORMAL LOW (ref >60)
Glucose, Bld: 93 mg/dL (ref 70–99)
Potassium: 4.2 mmol/L (ref 3.5–5.1)
Sodium: 140 mmol/L (ref 135–145)
Total Bilirubin: 0.2 mg/dL — ABNORMAL LOW (ref 0.3–1.2)
Total Protein: 7.6 g/dL (ref 6.5–8.1)

## 2022-05-17 LAB — CBC WITH DIFFERENTIAL (CANCER CENTER ONLY)
Abs Immature Granulocytes: 0.03 10*3/uL (ref 0.00–0.07)
Basophils Absolute: 0.1 10*3/uL (ref 0.0–0.1)
Basophils Relative: 1 %
Eosinophils Absolute: 0.1 10*3/uL (ref 0.0–0.5)
Eosinophils Relative: 2 %
HCT: 30.1 % — ABNORMAL LOW (ref 39.0–52.0)
Hemoglobin: 9.7 g/dL — ABNORMAL LOW (ref 13.0–17.0)
Immature Granulocytes: 0 %
Lymphocytes Relative: 13 %
Lymphs Abs: 0.9 10*3/uL (ref 0.7–4.0)
MCH: 30.4 pg (ref 26.0–34.0)
MCHC: 32.2 g/dL (ref 30.0–36.0)
MCV: 94.4 fL (ref 80.0–100.0)
Monocytes Absolute: 0.8 10*3/uL (ref 0.1–1.0)
Monocytes Relative: 11 %
Neutro Abs: 5.1 10*3/uL (ref 1.7–7.7)
Neutrophils Relative %: 73 %
Platelet Count: 305 10*3/uL (ref 150–400)
RBC: 3.19 MIL/uL — ABNORMAL LOW (ref 4.22–5.81)
RDW: 13.3 % (ref 11.5–15.5)
WBC Count: 7 10*3/uL (ref 4.0–10.5)
nRBC: 0 % (ref 0.0–0.2)

## 2022-05-17 LAB — TSH: TSH: 2.049 u[IU]/mL (ref 0.350–4.500)

## 2022-05-17 MED ORDER — SODIUM CHLORIDE 0.9% FLUSH
10.0000 mL | INTRAVENOUS | Status: DC | PRN
  Administered 2022-05-17: 10 mL

## 2022-05-17 MED ORDER — SODIUM CHLORIDE 0.9 % IV SOLN: Freq: Once | INTRAVENOUS | Status: AC

## 2022-05-17 MED ORDER — SODIUM CHLORIDE 0.9 % IV SOLN
200.0000 mg | Freq: Once | INTRAVENOUS | Status: AC
  Administered 2022-05-17: 200 mg via INTRAVENOUS
  Filled 2022-05-17: qty 200

## 2022-05-17 MED ORDER — SODIUM CHLORIDE 0.9% FLUSH
10.0000 mL | Freq: Once | INTRAVENOUS | Status: AC
  Administered 2022-05-17: 10 mL

## 2022-05-17 MED ORDER — HEPARIN SOD (PORK) LOCK FLUSH 100 UNIT/ML IV SOLN
500.0000 [IU] | Freq: Once | INTRAVENOUS | Status: AC | PRN
  Administered 2022-05-17: 500 [IU]

## 2022-05-17 NOTE — Progress Notes (Signed)
Compazine premed in treatment plan, pt declined today

## 2022-05-17 NOTE — Progress Notes (Signed)
[place] [phone]   [phone]  OFFICE PROGRESS NOTE  [name], MD [place] [address]  DIAGNOSIS:  Stage IV (T2b, N2, M1 B) non-small cell lung cancer, adenocarcinoma presented with large central left lower lobe lung mass with left hilar and mediastinal lymphadenopathy as well as abdominal retroperitoneal lymphadenopathy diagnosed in May 2022.  The patient also has bilateral parotid gland nodules that need close monitoring.  [facility] MOLECULAR STUDY: Results with Therapy Associations BIOMARKER METHOD ANALYTE RESULT THERAPY ASSOCIATION BIOMARKER LEVEL* .PD-L1 (22c3) IHC Protein Positive, TPS: 50% BENEFIT cemiplimab, pembrolizumab Level 1 .PD-L1 (28-8) IHC Protein Positive  1+, 50% BENEFIT nivolumab/ipilimumab combination Level 1 .TMB Seq DNA-Tumor High, 18 mut/Mb BENEFIT pembrolizumab Level 2 . alectinib, ceritinib, crizotinib, lorlatinib Level 1 . IHC Protein Negative  0 brigatinib Level 2 . ALK Seq RNA-Tumor Fusion Not Detected LACK OF BENEFIT alectinib, brigatinib, ceritinib, crizotinib, lorlatinib Level 2 .BRAF Seq DNA-Tumor Mutation Not Detected LACK OF BENEFIT dabrafenib and trametinib combination therapy, vemurafenib Level 2 .EGFR Seq DNA-Tumor Mutation Not Detected LACK OF BENEFIT erlotinib, gefitinib Level 2 .KRAS Seq DNA-Tumor Mutation Not Detected LACK OF BENEFIT sotorasib Level 2 .RET Seq RNA-Tumor Fusion Not Detected LACK OF BENEFIT pralsetinib, selpercatinib Level 2 .ROS1 Seq RNA-Tumor Fusion Not Detected LACK OF BENEFIT ceritinib, crizotinib, entrectinib, lorlatinib Level 2 . CNA-Seq DNA-Tumor Amplification Not Detected .MET Seq RNA-Tumor Variant Transcript Not Detected LACK OF BENEFIT crizotinib Level 3  PRIOR THERAPY: Palliative radiotherapy to the large central left lower lobe lung mass under the care of Dr. Lisbeth Renshaw.  CURRENT THERAPY: Systemic chemotherapy with carboplatin for AUC  of 5, Alimta 500 Mg/M2 and possibly Keytruda 200 Mg IV every 3 weeks.  First dose expected June 14, 2021.  Starting from cycle #5 the patient is on maintenance treatment with Alimta and Keytruda every 3 weeks.  Status post 16 cycles of treatment.  Starting from cycle #12 the dose of Alimta to 400 Mg/M2 because of his increasing fatigue and side effects.  Alimta was discontinued secondary to renal insufficiency.  INTERVAL HISTORY: ARNALDO HEFFRON 46 y.o. male returns to the clinic today for follow-up visit.  The patient is feeling fine today with no concerning complaints.  He has been on treatment with single agent Keytruda for the last few cycles secondary to renal insufficiency and he is tolerating it fairly well.  He continues to have mild fatigue secondary to previous chemotherapy-induced anemia.  He has no current nausea, vomiting, diarrhea or constipation.  He has no chest pain, shortness of breath, cough or hemoptysis.  He has no headache or visual changes.  He is here today for evaluation before starting cycle #17 of his treatment.   MEDICAL HISTORY: Past Medical History:  Diagnosis Date   Chronic back pain    Eczema    GERD (gastroesophageal reflux disease)    Lung cancer (HCC)     ALLERGIES:  is allergic to penicillins and zofran [ondansetron].  MEDICATIONS:  Current Outpatient Medications  Medication Sig Dispense Refill   acetaminophen (TYLENOL) 500 MG tablet Take 500-1,000 mg by mouth every 6 (six) hours as needed (for back pain.).     folic acid (FOLVITE) 1 MG tablet Take 1 tablet (1 mg total) by mouth daily. 30 tablet 4   lidocaine-prilocaine (EMLA) cream Apply 1 application topically as needed. 30 g 2   magic mouthwash SOLN Take 5 mLs by mouth 3 (three) times daily as needed for mouth pain. [place]  mL 0   prochlorperazine (COMPAZINE) 10 MG tablet Take 1 tablet (10 mg total) by mouth every 6 (six) hours as needed for nausea or vomiting. 30 tablet 0   traMADol (ULTRAM) 50 MG  tablet Take 50 mg by mouth every 6 (six) hours as needed for severe pain.     zolpidem (AMBIEN) 10 MG tablet Take 5-10 mg by mouth at bedtime as needed for sleep.     No current facility-administered medications for this visit.    SURGICAL HISTORY:  Past Surgical History:  Procedure Laterality Date   BRONCHIAL BRUSHINGS  05/12/2021   Procedure: BRONCHIAL BRUSHINGS;  Surgeon: Garner Nash, DO;  Location: [facility];  Service: Pulmonary;;   BRONCHIAL DILITATION  05/12/2021   Procedure: BRONCHIAL DILITATION;  Surgeon: Garner Nash, DO;  Location: [facility];  Service: Pulmonary;;   BRONCHIAL NEEDLE ASPIRATION BIOPSY  05/12/2021   Procedure: BRONCHIAL NEEDLE ASPIRATION BIOPSIES;  Surgeon: Garner Nash, DO;  Location: [facility];  Service: Pulmonary;;   BRONCHIAL WASHINGS  05/12/2021   Procedure: BRONCHIAL WASHINGS;  Surgeon: Garner Nash, DO;  Location: [facility];  Service: Pulmonary;;   CRYOTHERAPY  05/12/2021   Procedure: CRYOTHERAPY;  Surgeon: Garner Nash, DO;  Location: [facility];  Service: Pulmonary;;   HEMOSTASIS CONTROL  05/12/2021   Procedure: HEMOSTASIS CONTROL;  Surgeon: Garner Nash, DO;  Location: [facility];  Service: Pulmonary;;  epi injection   IR IMAGING GUIDED PORT INSERTION  06/29/2021   Spine injection     Pain control   VIDEO BRONCHOSCOPY WITH ENDOBRONCHIAL ULTRASOUND N/A 05/12/2021   Procedure: VIDEO BRONCHOSCOPY WITH ENDOBRONCHIAL ULTRASOUND;  Surgeon: Garner Nash, DO;  Location: [facility];  Service: Pulmonary;  Laterality: N/A;   WISDOM TOOTH EXTRACTION     no anesthesia involed    REVIEW OF SYSTEMS:  A comprehensive review of systems was negative except for: Constitutional: positive for fatigue   PHYSICAL EXAMINATION: General appearance: alert, cooperative, and no distress Head: Normocephalic, without obvious abnormality, atraumatic Neck: no adenopathy, no JVD, supple, symmetrical, trachea midline, and thyroid not enlarged,  symmetric, no tenderness/mass/nodules Lymph nodes: Cervical, supraclavicular, and axillary nodes normal. Resp: clear to auscultation bilaterally Back: symmetric, no curvature. ROM normal. No CVA tenderness. Cardio: regular rate and rhythm, S1, S2 normal, no murmur, click, rub or gallop GI: soft, non-tender; bowel sounds normal; no masses,  no organomegaly Extremities: extremities normal, atraumatic, no cyanosis or edema  ECOG PERFORMANCE STATUS: 1 - Symptomatic but completely ambulatory  Blood pressure 125/74, pulse 79, temperature 97.9 F (36.6 C), temperature source Tympanic, resp. rate 18, weight 171 lb 7 oz (77.8 kg), SpO2 100 %.  LABORATORY DATA: Lab Results  Component Value Date   WBC 7.0 05/17/2022   HGB 9.7 (L) 05/17/2022   HCT 30.1 (L) 05/17/2022   MCV 94.4 05/17/2022   PLT 305 05/17/2022      Chemistry      Component Value Date/Time   NA 140 04/26/2022 1005   K 3.8 04/26/2022 1005   CL 108 04/26/2022 1005   CO2 26 04/26/2022 1005   BUN 21 (H) 04/26/2022 1005   CREATININE 1.83 (H) 04/26/2022 1005      Component Value Date/Time   CALCIUM 8.9 04/26/2022 1005   ALKPHOS 89 04/26/2022 1005   AST 11 (L) 04/26/2022 1005   ALT 8 04/26/2022 1005   BILITOT 0.2 (L) 04/26/2022 1005       RADIOGRAPHIC STUDIES: CT Chest W Contrast  Result Date: 04/23/2022 CLINICAL  DATA:  Non-small cell lung cancer.  Restaging. EXAM: CT CHEST, ABDOMEN, AND PELVIS WITH CONTRAST TECHNIQUE: Multidetector CT imaging of the chest, abdomen and pelvis was performed following the standard protocol during bolus administration of intravenous contrast. RADIATION DOSE REDUCTION: This exam was performed according to the departmental dose-optimization program which includes automated exposure control, adjustment of the mA and/or kV according to patient size and/or use of iterative reconstruction technique. CONTRAST:  53mL OMNIPAQUE IOHEXOL 300 MG/ML  SOLN COMPARISON:  02/19/2022 FINDINGS: CT CHEST FINDINGS  Cardiovascular: Heart size is normal. No pericardial effusion identified. Aortic atherosclerosis. Mediastinum/Nodes: No enlarged mediastinal, hilar, or axillary lymph nodes. Soft tissue thickening about the left hilum with stranding in the left mediastinal fat appears stable from previous exam compatible with changes secondary to external beam radiation. Thyroid gland, trachea, and esophagus demonstrate no significant findings. Lungs/Pleura: Mild changes of emphysema. New tiny peripheral nodule within the right lower lobe measures 5 mm, image 108/4. This is nonspecific and may be postinflammatory or infectious in etiology. Small loculated left pleural effusion is stable from the previous exam. Paramediastinal and perihilar fibrosis is again noted within the left lung compatible with changes secondary to external beam radiation. This appears unchanged in the interval. Musculoskeletal: No chest wall mass or suspicious bone lesions identified. CT ABDOMEN PELVIS FINDINGS Hepatobiliary: No focal liver abnormality is seen. No gallstones, gallbladder wall thickening, or biliary dilatation. Pancreas: Unremarkable. No pancreatic ductal dilatation or surrounding inflammatory changes. Spleen: Normal in size without focal abnormality. Adrenals/Urinary Tract: Normal adrenal glands. Stable appearance of Bosniak class 2 cyst arising off the upper pole of the left kidney measuring 2.5 cm, image 14/7. No follow-up recommended. No suspicious mass or hydronephrosis. Urinary bladder is unremarkable. Stomach/Bowel: Stomach appears normal. The appendix is visualized and appears within normal limits. Sigmoid diverticulosis without signs of acute diverticulitis. Vascular/Lymphatic: Mild aortic atherosclerosis. No aneurysm. No signs of abdominopelvic adenopathy. Reproductive: Prostate is unremarkable. Other: No free fluid or fluid collections. Musculoskeletal: No acute or significant osseous findings. IMPRESSION: 1. Unchanged post  treatment/radiation appearance of the left lung. Stable loculated left pleural effusion. 2. Unchanged appearance of soft tissue thickening about the left hilum and left mediastinal fat. No enlarged mediastinal or hilar lymph nodes identified. 3. No signs of distant metastatic disease. 4. Aortic Atherosclerosis (ICD10-I70.0) and Emphysema (ICD10-J43.9). * Tracking Code: BO * Electronically Signed   By: Kerby Moors M.D.   On: 04/23/2022 16:00   CT Abdomen Pelvis W Contrast  Result Date: 04/23/2022 CLINICAL DATA:  Non-small cell lung cancer.  Restaging. EXAM: CT CHEST, ABDOMEN, AND PELVIS WITH CONTRAST TECHNIQUE: Multidetector CT imaging of the chest, abdomen and pelvis was performed following the standard protocol during bolus administration of intravenous contrast. RADIATION DOSE REDUCTION: This exam was performed according to the departmental dose-optimization program which includes automated exposure control, adjustment of the mA and/or kV according to patient size and/or use of iterative reconstruction technique. CONTRAST:  28mL OMNIPAQUE IOHEXOL 300 MG/ML  SOLN COMPARISON:  02/19/2022 FINDINGS: CT CHEST FINDINGS Cardiovascular: Heart size is normal. No pericardial effusion identified. Aortic atherosclerosis. Mediastinum/Nodes: No enlarged mediastinal, hilar, or axillary lymph nodes. Soft tissue thickening about the left hilum with stranding in the left mediastinal fat appears stable from previous exam compatible with changes secondary to external beam radiation. Thyroid gland, trachea, and esophagus demonstrate no significant findings. Lungs/Pleura: Mild changes of emphysema. New tiny peripheral nodule within the right lower lobe measures 5 mm, image 108/4. This is nonspecific and may be postinflammatory or infectious  in etiology. Small loculated left pleural effusion is stable from the previous exam. Paramediastinal and perihilar fibrosis is again noted within the left lung compatible with changes  secondary to external beam radiation. This appears unchanged in the interval. Musculoskeletal: No chest wall mass or suspicious bone lesions identified. CT ABDOMEN PELVIS FINDINGS Hepatobiliary: No focal liver abnormality is seen. No gallstones, gallbladder wall thickening, or biliary dilatation. Pancreas: Unremarkable. No pancreatic ductal dilatation or surrounding inflammatory changes. Spleen: Normal in size without focal abnormality. Adrenals/Urinary Tract: Normal adrenal glands. Stable appearance of Bosniak class 2 cyst arising off the upper pole of the left kidney measuring 2.5 cm, image 14/7. No follow-up recommended. No suspicious mass or hydronephrosis. Urinary bladder is unremarkable. Stomach/Bowel: Stomach appears normal. The appendix is visualized and appears within normal limits. Sigmoid diverticulosis without signs of acute diverticulitis. Vascular/Lymphatic: Mild aortic atherosclerosis. No aneurysm. No signs of abdominopelvic adenopathy. Reproductive: Prostate is unremarkable. Other: No free fluid or fluid collections. Musculoskeletal: No acute or significant osseous findings. IMPRESSION: 1. Unchanged post treatment/radiation appearance of the left lung. Stable loculated left pleural effusion. 2. Unchanged appearance of soft tissue thickening about the left hilum and left mediastinal fat. No enlarged mediastinal or hilar lymph nodes identified. 3. No signs of distant metastatic disease. 4. Aortic Atherosclerosis (ICD10-I70.0) and Emphysema (ICD10-J43.9). * Tracking Code: BO * Electronically Signed   By: Kerby Moors M.D.   On: 04/23/2022 16:00    ASSESSMENT AND PLAN: This is a very pleasant 46 years old white male recently diagnosed with a stage IV (T2b, N2, M1 B) non-small cell lung cancer, adenocarcinoma presented with large central left lower lobe lung mass in addition to left hilar and mediastinal lymphadenopathy as well as retroperitoneal abdominal lymph node diagnosed in May 2022.  The  patient is currently undergoing palliative radiotherapy to the large left lower lobe lung mass under the care of of Dr. Lisbeth Renshaw.  He is expected to complete this course of treatment on June 12, 2021. MRI of the brain showed no evidence of metastatic disease to the brain. His molecular studies by [facility] showed no actionable mutations and PD-L1 expression of 50%. He is currently undergoing systemic chemotherapy with carboplatin for AUC of 5, Alimta 500 Mg/M2 and Keytruda 200 Mg IV every 3 weeks.  Starting from cycle #5 he is on maintenance treatment with Alimta at 400 Mg/M2 and Keytruda 200 Mg IV every 3 weeks status post 16 cycles.  He is currently on single agent Keytruda secondary to renal insufficiency. The patient has been tolerating this treatment well with no concerning adverse effects. I recommended for the patient to proceed with cycle #17 today as planned. I will see him back for follow-up visit in 3 weeks for evaluation before the next cycle of his treatment. For the renal insufficiency, he was referred to nephrology and has an appointment scheduled in July 2023. The patient was advised to call immediately if he has any other concerning symptoms in the interval. The patient voices understanding of current disease status and treatment options and is in agreement with the current care plan. All questions were answered. The patient knows to call the clinic with any problems, questions or concerns. We can certainly see the patient much sooner if necessary.  Disclaimer: This note was dictated with voice recognition software. Similar sounding words can inadvertently be transcribed and may not be corrected upon review.

## 2022-05-17 NOTE — Progress Notes (Signed)
Ok to treat with creat 1.75 mg/dL per Dr Julien Nordmann

## 2022-05-17 NOTE — Patient Instructions (Signed)
[facility] MEDICAL ONCOLOGY  Discharge Instructions: Thank you for choosing [facility] to provide your oncology and hematology care.   If you have a lab appointment with the Cancer Center, please go directly to the Cancer Center and check in at the registration area.   Wear comfortable clothing and clothing appropriate for easy access to any Portacath or PICC line.   We strive to give you quality time with your provider. You may need to reschedule your appointment if you arrive late (15 or more minutes).  Arriving late affects you and other patients whose appointments are after yours.  Also, if you miss three or more appointments without notifying the office, you may be dismissed from the clinic at the provider's discretion.      For prescription refill requests, have your pharmacy contact our office and allow 72 hours for refills to be completed.    Today you received the following chemotherapy and/or immunotherapy agents: Keytruda      To help prevent nausea and vomiting after your treatment, we encourage you to take your nausea medication as directed.  BELOW ARE SYMPTOMS THAT SHOULD BE REPORTED IMMEDIATELY: *FEVER GREATER THAN 100.4 F (38 C) OR HIGHER *CHILLS OR SWEATING *NAUSEA AND VOMITING THAT IS NOT CONTROLLED WITH YOUR NAUSEA MEDICATION *UNUSUAL SHORTNESS OF BREATH *UNUSUAL BRUISING OR BLEEDING *URINARY PROBLEMS (pain or burning when urinating, or frequent urination) *BOWEL PROBLEMS (unusual diarrhea, constipation, pain near the anus) TENDERNESS IN MOUTH AND THROAT WITH OR WITHOUT PRESENCE OF ULCERS (sore throat, sores in mouth, or a toothache) UNUSUAL RASH, SWELLING OR PAIN  UNUSUAL VAGINAL DISCHARGE OR ITCHING   Items with * indicate a potential emergency and should be followed up as soon as possible or go to the Emergency Department if any problems should occur.  Please show the CHEMOTHERAPY ALERT CARD or IMMUNOTHERAPY ALERT CARD at check-in to  the Emergency Department and triage nurse.  Should you have questions after your visit or need to cancel or reschedule your appointment, please contact [facility] MEDICAL ONCOLOG[facility]  Dept: [phone]  and follow the prompts.  Office hours are 8:00 a.m. to 4:30 p.m. Monday - Friday. Please note that voicemails left after 4:00 p.m. may not be returned until the following business day.  We are closed weekends and major holidays. You have access to a nurse at all times for urgent questions. Please call the main number to the clinic Dept: [phone] and follow the prompts.   For any non-urgent questions, you may also contact your provider using MyChart. We now offer e-Visits for anyone 18 and older to request care online for non-urgent symptoms. For details visit [email or web address].   Also download the MyChart app! Go to the app store, search "MyChart", open the app, select [facility], and log in with your MyChart username and password.  Masks are optional in the cancer centers. If you would like for your care team to wear a mask while they are taking care of you, please let them know. For doctor visits, patients may have with them one support person who is at least 48 years old. At this time, visitors are not allowed in the infusion area. 

## 2022-06-01 NOTE — Progress Notes (Unsigned)
[facility] OFFICE PROGRESS NOTE  [name], MD [place] [address]  DIAGNOSIS: Stage IV (T2b, N2, M1 B) non-small cell lung cancer, adenocarcinoma presented with large central left lower lobe lung mass with left hilar and mediastinal lymphadenopathy as well as abdominal retroperitoneal lymphadenopathy diagnosed in May 2022.  The patient also has bilateral parotid gland nodules that need close monitoring.   [facility] MOLECULAR STUDY: Results with Therapy Associations BIOMARKER METHOD ANALYTE RESULT THERAPY ASSOCIATION BIOMARKER LEVEL* .PD-L1 (22c3) IHC Protein Positive, TPS: 50% BENEFIT cemiplimab, pembrolizumab Level 1 .PD-L1 (28-8) IHC Protein Positive  1+, 50% BENEFIT nivolumab/ipilimumab combination Level 1 .TMB Seq DNA-Tumor High, 18 mut/Mb BENEFIT pembrolizumab Level 2 . alectinib, ceritinib, crizotinib, lorlatinib Level 1 . IHC Protein Negative  0 brigatinib Level 2 . ALK Seq RNA-Tumor Fusion Not Detected LACK OF BENEFIT alectinib, brigatinib, ceritinib, crizotinib, lorlatinib Level 2 .BRAF Seq DNA-Tumor Mutation Not Detected LACK OF BENEFIT dabrafenib and trametinib combination therapy, vemurafenib Level 2 .EGFR Seq DNA-Tumor Mutation Not Detected LACK OF BENEFIT erlotinib, gefitinib Level 2 .KRAS Seq DNA-Tumor Mutation Not Detected LACK OF BENEFIT sotorasib Level 2 .RET Seq RNA-Tumor Fusion Not Detected LACK OF BENEFIT pralsetinib, selpercatinib Level 2 .ROS1 Seq RNA-Tumor Fusion Not Detected LACK OF BENEFIT ceritinib, crizotinib, entrectinib, lorlatinib Level 2 . CNA-Seq DNA-Tumor Amplification Not Detected . MET Seq RNA-Tumor Variant Transcript Not Detected LACK OF BENEFIT crizotinib Level 3  PRIOR THERAPY: Palliative radiotherapy to the large central left lower lobe lung mass under the care of Dr. Lisbeth Renshaw. Last treatment on 06/12/21.  CURRENT THERAPY: Systemic chemotherapy with carboplatin for AUC of 5, Alimta 500 Mg/M2  and Keytruda 200 Mg IV every 3 weeks.  First dose expected June 14, 2021.  Status post 17 cycles of treatment.  Starting from cycle #5, the patient will begin maintenance Keytruda and Alimta.  Alimta was reduced to 400 mg per metered squared starting from cycle number 12 due to fatigue.  Alimta discontinued starting from cycle #13 due to renal insufficiency   INTERVAL HISTORY: Cameron Harper 46 y.o. male returns to the clinic today for a follow-up visit.  The patient is feeling fairly well today without any concerning complaints.  The patient is currently undergoing single agent immunotherapy with Keytruda.  Alimta was discontinued secondary to elevated creatinine and fatigue. The patient's been tolerating single-agent immunotherapy well without any concerning adverse side effects except for some fatigue.  He was referred to nephrology regarding his renal insufficiency and saw them on Monday.    Otherwise the patient denies any new concerns today.  He denies any fever, chills, or night sweats.  He gained about 2 lbs since last being seen. He is eating better. He reports he is able to do most activities but may have shortness of breath with strenuous activities such as running or walking up hill.  Denies any chest pain, cough, or hemoptysis.  Denies any nausea, vomiting, diarrhea, or constipation.  Denies any headache or visual changes.  Denies any rashes or skin changes. He is here for evaluation and repeat blood work before starting cycle #18.    MEDICAL HISTORY: Past Medical History:  Diagnosis Date   Chronic back pain    Eczema    GERD (gastroesophageal reflux disease)    Lung cancer (HCC)     ALLERGIES:  is allergic to penicillins and zofran [ondansetron].  MEDICATIONS:  Current Outpatient Medications  Medication Sig Dispense Refill   acetaminophen (TYLENOL) 500 MG tablet Take 500-1,000 mg by  mouth every 6 (six) hours as needed (for back pain.). (Patient not taking: Reported on  03/19/4080)     folic acid (FOLVITE) 1 MG tablet Take 1 tablet (1 mg total) by mouth daily. 30 tablet 4   lidocaine-prilocaine (EMLA) cream Apply 1 application topically as needed. 30 g 2   prochlorperazine (COMPAZINE) 10 MG tablet Take 1 tablet (10 mg total) by mouth every 6 (six) hours as needed for nausea or vomiting. (Patient not taking: Reported on 05/17/2022) 30 tablet 0   No current facility-administered medications for this visit.    SURGICAL HISTORY:  Past Surgical History:  Procedure Laterality Date   BRONCHIAL BRUSHINGS  05/12/2021   Procedure: BRONCHIAL BRUSHINGS;  Surgeon: Garner Nash, DO;  Location: [facility];  Service: Pulmonary;;   BRONCHIAL DILITATION  05/12/2021   Procedure: BRONCHIAL DILITATION;  Surgeon: Garner Nash, DO;  Location: [facility];  Service: Pulmonary;;   BRONCHIAL NEEDLE ASPIRATION BIOPSY  05/12/2021   Procedure: BRONCHIAL NEEDLE ASPIRATION BIOPSIES;  Surgeon: Garner Nash, DO;  Location: [facility];  Service: Pulmonary;;   BRONCHIAL WASHINGS  05/12/2021   Procedure: BRONCHIAL WASHINGS;  Surgeon: Garner Nash, DO;  Location: [facility];  Service: Pulmonary;;   CRYOTHERAPY  05/12/2021   Procedure: CRYOTHERAPY;  Surgeon: Garner Nash, DO;  Location: [facility];  Service: Pulmonary;;   HEMOSTASIS CONTROL  05/12/2021   Procedure: HEMOSTASIS CONTROL;  Surgeon: Garner Nash, DO;  Location: [facility];  Service: Pulmonary;;  epi injection   IR IMAGING GUIDED PORT INSERTION  06/29/2021   Spine injection     Pain control   VIDEO BRONCHOSCOPY WITH ENDOBRONCHIAL ULTRASOUND N/A 05/12/2021   Procedure: VIDEO BRONCHOSCOPY WITH ENDOBRONCHIAL ULTRASOUND;  Surgeon: Garner Nash, DO;  Location: [facility];  Service: Pulmonary;  Laterality: N/A;   WISDOM TOOTH EXTRACTION     no anesthesia involed    REVIEW OF SYSTEMS:   Review of Systems  Constitutional: Negative for appetite change, chills, fatigue, fever and unexpected weight  change.  HENT:   Negative for mouth sores, nosebleeds, sore throat and trouble swallowing.   Eyes: Negative for eye problems and icterus.  Respiratory: Positive for shortness of breath with strenuous activity. Negative for cough, hemoptysis, and wheezing.   Cardiovascular: Negative for chest pain and leg swelling.  Gastrointestinal: Negative for abdominal pain, constipation, diarrhea, nausea and vomiting.  Genitourinary: Negative for bladder incontinence, difficulty urinating, dysuria, frequency and hematuria.   Musculoskeletal: Negative for back pain, gait problem, neck pain and neck stiffness.  Skin: Negative for itching and rash.  Neurological: Negative for dizziness, extremity weakness, gait problem, headaches, light-headedness and seizures.  Hematological: Negative for adenopathy. Does not bruise/bleed easily.  Psychiatric/Behavioral: Negative for confusion, depression and sleep disturbance. The patient is not nervous/anxious.     PHYSICAL EXAMINATION:  Blood pressure 124/79, pulse 78, temperature 97.9 F (36.6 C), temperature source Tympanic, weight 173 lb 8 oz (78.7 kg), SpO2 100 %.  ECOG PERFORMANCE STATUS: 1  Physical Exam  Constitutional: Oriented to person, place, and time and well-developed, well-nourished, and in no distress.  HENT:  Head: Normocephalic and atraumatic.  Mouth/Throat: Oropharynx is clear and moist. No oropharyngeal exudate.  Eyes: Conjunctivae are normal. Right eye exhibits no discharge. Left eye exhibits no discharge. No scleral icterus.  Neck: Normal range of motion. Neck supple.  Cardiovascular: Normal rate, regular rhythm, normal heart sounds and intact distal pulses.   Pulmonary/Chest: Effort normal and breath sounds normal. No respiratory distress. No wheezes. No  rales.  Abdominal: Soft. Bowel sounds are normal. Exhibits no distension and no mass. There is no tenderness.  Musculoskeletal: Normal range of motion. Exhibits no edema.  Lymphadenopathy:     No cervical adenopathy.  Neurological: Alert and oriented to person, place, and time. Exhibits normal muscle tone. Gait normal. Coordination normal.  Skin: Skin is warm and dry. No rash noted. Not diaphoretic. No erythema. No pallor.  Psychiatric: Mood, memory and judgment normal.  Vitals reviewed.  LABORATORY DATA: Lab Results  Component Value Date   WBC 7.5 06/07/2022   HGB 9.8 (L) 06/07/2022   HCT 30.2 (L) 06/07/2022   MCV 91.8 06/07/2022   PLT 326 06/07/2022      Chemistry      Component Value Date/Time   NA 139 06/07/2022 1054   K 4.3 06/07/2022 1054   CL 106 06/07/2022 1054   CO2 28 06/07/2022 1054   BUN 18 06/07/2022 1054   CREATININE 1.67 (H) 06/07/2022 1054      Component Value Date/Time   CALCIUM 9.3 06/07/2022 1054   ALKPHOS 96 06/07/2022 1054   AST 10 (L) 06/07/2022 1054   ALT 9 06/07/2022 1054   BILITOT 0.3 06/07/2022 1054       RADIOGRAPHIC STUDIES:  No results found.   ASSESSMENT/PLAN:  This is a very pleasant 46 year old Caucasian male diagnosed with stage IV (T2b, N2, M1 B) non-small cell lung cancer, adenocarcinoma.  He presented with a large central left lower lobe lung mass in addition to left hilar mediastinal lymphadenopathy.  He also has retroperitoneal abdominal lymphadenopathy.  He was diagnosed in May 2022.  His PD-L1 expression is 50%.  His molecular studies by [facility] did not show any evidence of any actionable mutations.  He underwent palliative radiotherapy to the large left lower lobe lung mass under the care of Dr. Lisbeth Renshaw.  He completed this on 06/12/2021.    The patient is currently undergoing systemic chemotherapy with carboplatin for an AUC of 5, Alimta 500 mg per metered squared, and Keytruda 200 mg IV every 3 weeks.  He is status post 17 cycles.  Starting from cycle #5, the patient has been on maintenance Alimta and Keytruda.  The dose of Alimta was reduced to 400 mg per metered squared starting from cycle #12 due to fatigue.  Alimta was  removed from the treatment plan starting from cycle #13 due to renal insufficiency.   Labs were reviewed.  Recommend that he proceed with cycle #18 today scheduled. Ok to treat with creatinine of 1.67  I will arrange for a CT of the chest, abdomen, and pelvis without contrast to restage his disease.   We will see him back for follow-up visit in 3 weeks for evaluation and repeat blood work before starting cycle #19.  He will continue to follow with nephrology regarding his renal insufficiency. They have given him a list of nephrotoxic agents to avoid.   The patient was advised to call immediately if he has any concerning symptoms in the interval. The patient voices understanding of current disease status and treatment options and is in agreement with the current care plan. All questions were answered. The patient knows to call the clinic with any problems, questions or concerns. We can certainly see the patient much sooner if necessary         Orders Placed This Encounter  Procedures   CT Chest Wo Contrast    Standing Status:   Future    Standing Expiration Date:   06/07/2023  Order Specific Question:   Preferred imaging location?    Answer:   [facility]   CT Abdomen Pelvis Wo Contrast    Standing Status:   Future    Standing Expiration Date:   06/07/2023    Order Specific Question:   Preferred imaging location?    Answer:   [facility]    Order Specific Question:   Is Oral Contrast requested for this exam?    Answer:   Yes, Per Radiology protocol   Iron and Iron Binding Capacity ([facility] only)    Standing Status:   Future    Standing Expiration Date:   06/08/2023   Ferritin    Standing Status:   Future    Standing Expiration Date:   06/08/2023      The total time spent in the appointment was 20-29 minutes.   Honor Fairbank L Meghen Akopyan, PA-C 06/07/22

## 2022-06-07 ENCOUNTER — Other Ambulatory Visit: Payer: Self-pay

## 2022-06-07 ENCOUNTER — Inpatient Hospital Stay (HOSPITAL_BASED_OUTPATIENT_CLINIC_OR_DEPARTMENT_OTHER): Payer: 59 | Admitting: Physician Assistant

## 2022-06-07 ENCOUNTER — Inpatient Hospital Stay: Payer: 59

## 2022-06-07 ENCOUNTER — Inpatient Hospital Stay: Payer: 59 | Attending: Physician Assistant

## 2022-06-07 VITALS — BP 124/79 | HR 78 | Temp 97.9°F | Wt 173.5 lb

## 2022-06-07 DIAGNOSIS — Z5112 Encounter for antineoplastic immunotherapy: Secondary | ICD-10-CM | POA: Diagnosis not present

## 2022-06-07 DIAGNOSIS — C3432 Malignant neoplasm of lower lobe, left bronchus or lung: Secondary | ICD-10-CM | POA: Insufficient documentation

## 2022-06-07 DIAGNOSIS — Z79899 Other long term (current) drug therapy: Secondary | ICD-10-CM | POA: Diagnosis not present

## 2022-06-07 DIAGNOSIS — Z95828 Presence of other vascular implants and grafts: Secondary | ICD-10-CM

## 2022-06-07 DIAGNOSIS — Z87891 Personal history of nicotine dependence: Secondary | ICD-10-CM | POA: Diagnosis not present

## 2022-06-07 LAB — CBC WITH DIFFERENTIAL (CANCER CENTER ONLY)
Abs Immature Granulocytes: 0.05 10*3/uL (ref 0.00–0.07)
Basophils Absolute: 0.1 10*3/uL (ref 0.0–0.1)
Basophils Relative: 1 %
Eosinophils Absolute: 0.1 10*3/uL (ref 0.0–0.5)
Eosinophils Relative: 2 %
HCT: 30.2 % — ABNORMAL LOW (ref 39.0–52.0)
Hemoglobin: 9.8 g/dL — ABNORMAL LOW (ref 13.0–17.0)
Immature Granulocytes: 1 %
Lymphocytes Relative: 12 %
Lymphs Abs: 0.9 10*3/uL (ref 0.7–4.0)
MCH: 29.8 pg (ref 26.0–34.0)
MCHC: 32.5 g/dL (ref 30.0–36.0)
MCV: 91.8 fL (ref 80.0–100.0)
Monocytes Absolute: 0.8 10*3/uL (ref 0.1–1.0)
Monocytes Relative: 11 %
Neutro Abs: 5.5 10*3/uL (ref 1.7–7.7)
Neutrophils Relative %: 73 %
Platelet Count: 326 10*3/uL (ref 150–400)
RBC: 3.29 MIL/uL — ABNORMAL LOW (ref 4.22–5.81)
RDW: 13.1 % (ref 11.5–15.5)
WBC Count: 7.5 10*3/uL (ref 4.0–10.5)
nRBC: 0 % (ref 0.0–0.2)

## 2022-06-07 LAB — CMP (CANCER CENTER ONLY)
ALT: 9 U/L (ref 0–44)
AST: 10 U/L — ABNORMAL LOW (ref 15–41)
Albumin: 3.6 g/dL (ref 3.5–5.0)
Alkaline Phosphatase: 96 U/L (ref 38–126)
Anion gap: 5 (ref 5–15)
BUN: 18 mg/dL (ref 6–20)
CO2: 28 mmol/L (ref 22–32)
Calcium: 9.3 mg/dL (ref 8.9–10.3)
Chloride: 106 mmol/L (ref 98–111)
Creatinine: 1.67 mg/dL — ABNORMAL HIGH (ref 0.61–1.24)
GFR, Estimated: 50 mL/min — ABNORMAL LOW (ref >60)
Glucose, Bld: 88 mg/dL (ref 70–99)
Potassium: 4.3 mmol/L (ref 3.5–5.1)
Sodium: 139 mmol/L (ref 135–145)
Total Bilirubin: 0.3 mg/dL (ref 0.3–1.2)
Total Protein: 7.6 g/dL (ref 6.5–8.1)

## 2022-06-07 LAB — TSH: TSH: 1.847 u[IU]/mL (ref 0.350–4.500)

## 2022-06-07 IMAGING — CT CT ABD-PELV W/ CM
2 of 5 series · 12 of 36 positions shown, 15 images · IV contrast (OMNIPAQUE)
Comparison: 05/02/2021 PET-CT

CLINICAL DATA: Primary Cancer Type: Lung
TECHNIQUE: Multidetector CT imaging of the chest, abdomen and pelvis was
performed following the standard protocol during bolus
administration of intravenous contrast.

CONTRAST:  75mL OMNIPAQUE IOHEXOL 350 MG/ML SOLN

[Series 2: cap with · axial · 0.74mm/px · z∈[-621,-71]mm · 9 of 136 slices shown, 12 images]
[im 13/136  mediastinal]
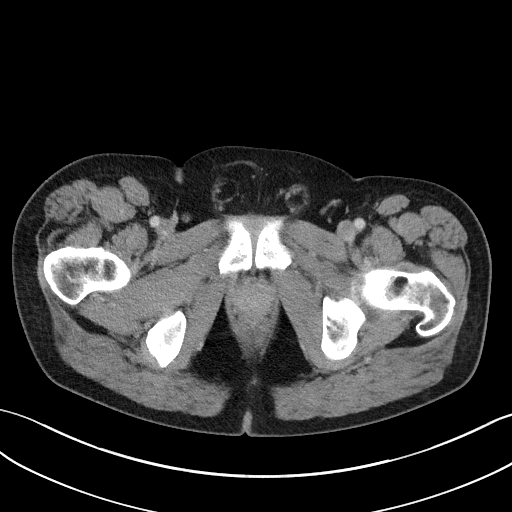
[im 13/136  lung]
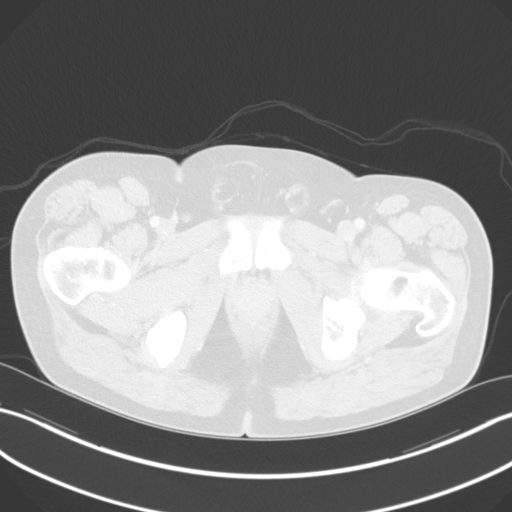
[im 25/136  lung]
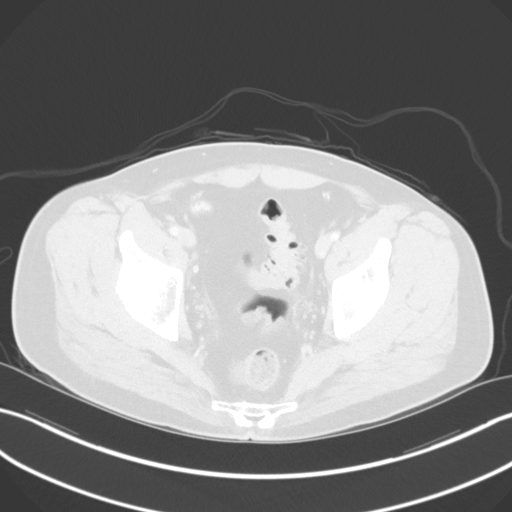
[im 37/136  lung]
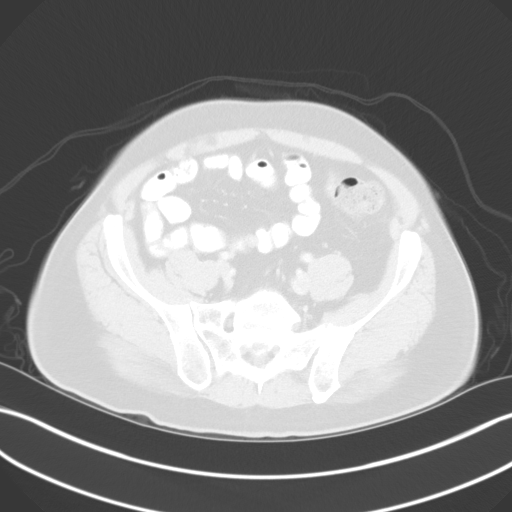
[im 50/136  lung]
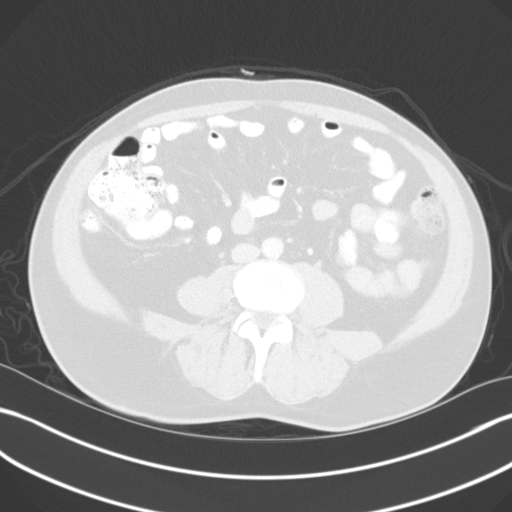
[im 74/136  mediastinal]
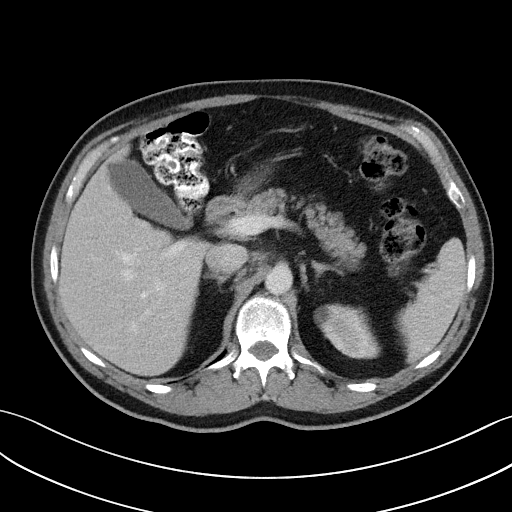
[im 74/136  lung]
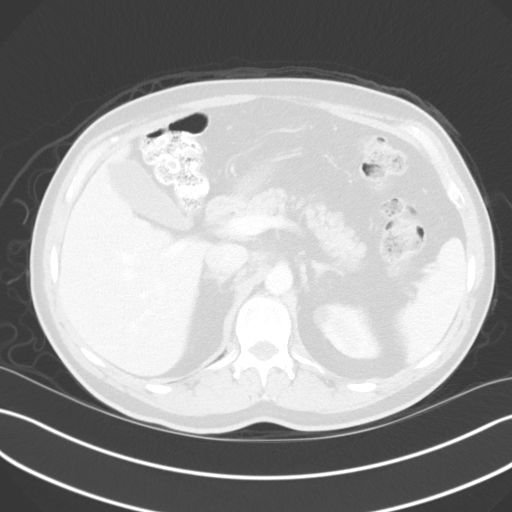
[im 86/136  lung]
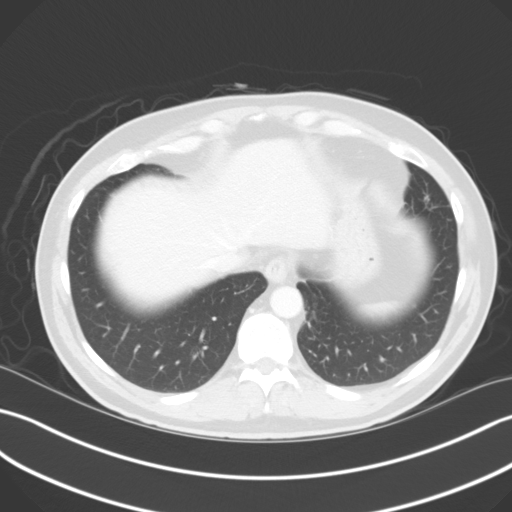
[im 99/136  lung]
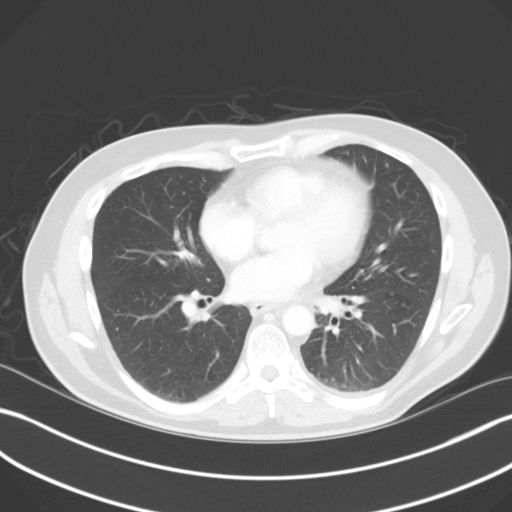
[im 111/136  lung]
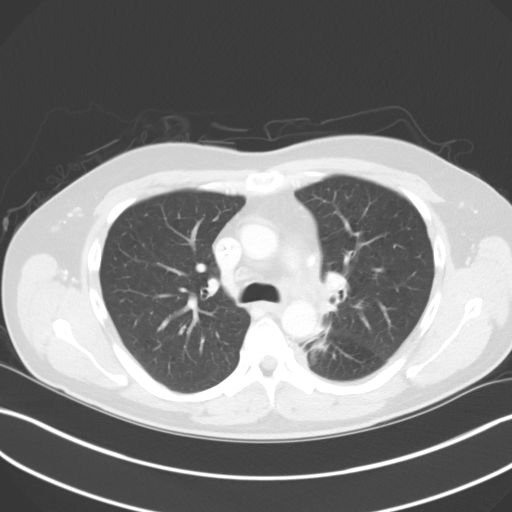
[im 123/136  mediastinal]
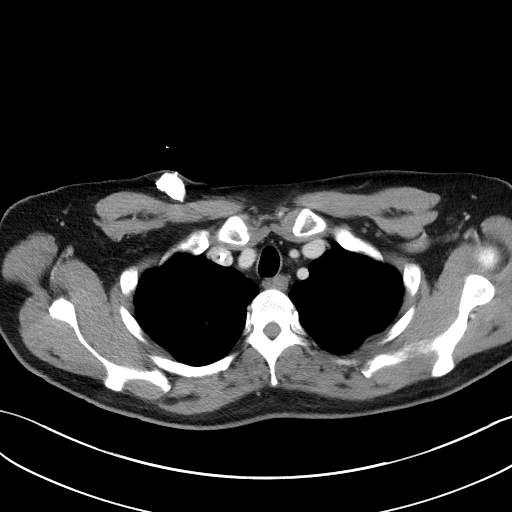
[im 123/136  lung]
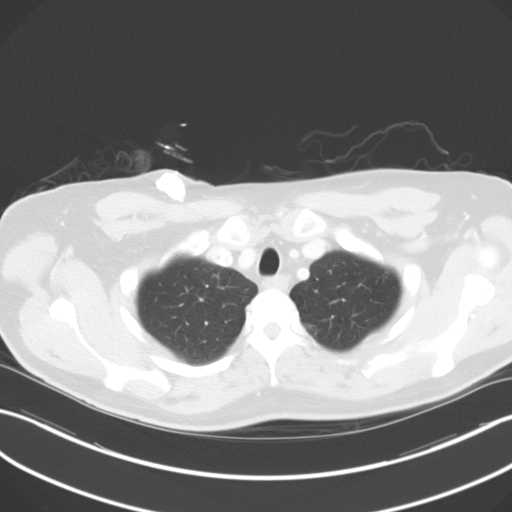

[Series 4: coronals · coronal · 0.79mm/px · 3 of 147 slices shown]
[im 30/147  lung]
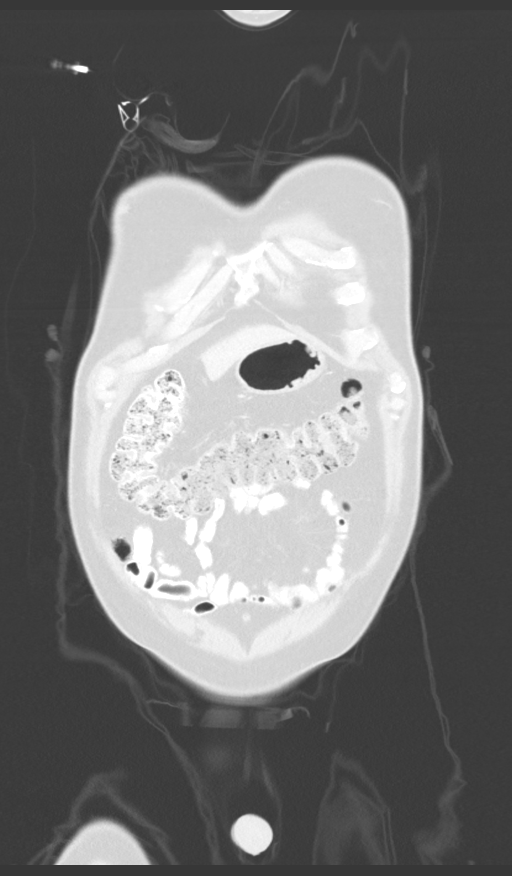
[im 59/147  lung]
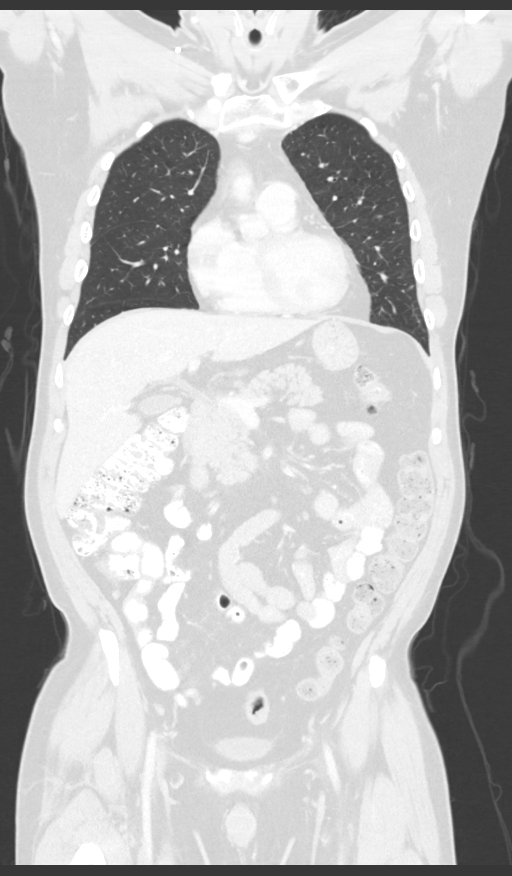
[im 88/147  lung]
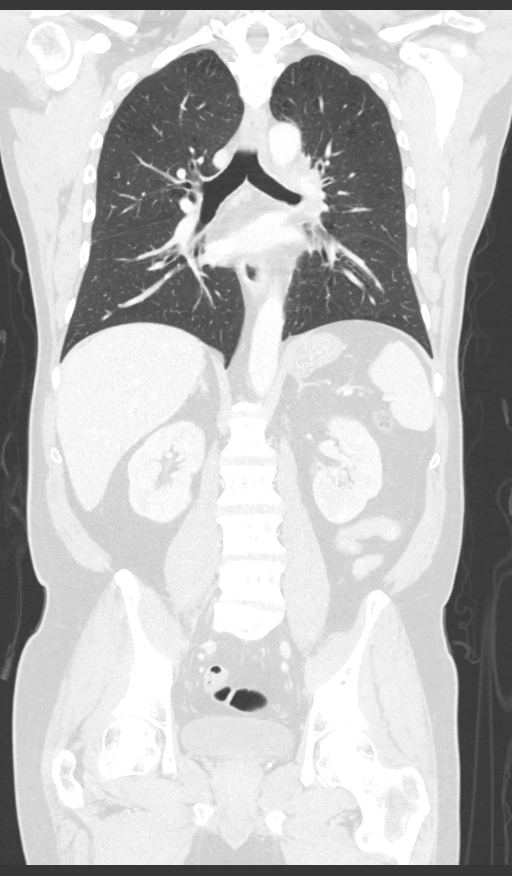

[12 of 36 positions shown; findings below may reference images not displayed]

Imaging Indication: Assess response to therapy

Interval therapy since last imaging? Yes

Initial Cancer Diagnosis

Date: 05/12/2021; Established by: Biopsy-proven

Detailed Pathology: Stage IV non-small cell lung cancer,
adenocarcinoma.

Primary Tumor location:  Left hilum and central left lower lobe.

Surgeries: No.

Chemotherapy: Yes; Ongoing? Yes; Most recent administration:
07/27/2021

Immunotherapy?  Yes; Type: Keytruda; Ongoing? Yes

Radiation therapy? Yes; Date Range: 05/22/2021 - 06/12/2021; Target:
Left lung

EXAM:
CT CHEST, ABDOMEN, AND PELVIS WITH CONTRAST
FINDINGS: CT CHEST FINDINGS

Cardiovascular: No acute findings. Aortic atherosclerotic
calcification noted. Minimal pericardial fluid again seen in the
superior pericardial recess and along the superior margin of the
right atrium.

Mediastinum/Lymph Nodes: There has been near complete resolution of
mediastinal lymphadenopathy since prior study. Largest residual
lymph node in the AP window measures 12 mm short axis compared to 21
mm previously. No new lymphadenopathy identified.

Lungs/Pleura: Significant decrease in size of soft tissue mass
involving the central left lower lobe and left hilum since prior
study. This currently measures 2.4 x 2.1 cm compared to 4.9 by
cm previously. No other suspicious pulmonary nodules or masses
identified. No evidence of pleural effusion.

Musculoskeletal:  No suspicious bone lesions identified.

CT ABDOMEN AND PELVIS FINDINGS

Hepatobiliary: No masses identified. Gallbladder is unremarkable. No
evidence of biliary ductal dilatation.

Pancreas:  No mass or inflammatory changes.

Spleen:  Within normal limits in size and appearance.

Adrenals/Urinary tract: Normal appearance of both adrenal glands.
Small cyst noted in the upper pole the left kidney. No renal masses
or hydronephrosis.

Stomach/Bowel: No evidence of obstruction, inflammatory process, or
abnormal fluid collections. Normal appendix visualized.
Diverticulosis is seen mainly involving the sigmoid colon, however
there is no evidence of diverticulitis.

Vascular/Lymphatic: Decreased retroperitoneal lymphadenopathy is
seen in the aortocaval space, with largest residual lymph node
measuring 9 mm on image 64/2, compared to 21 mm previously. No other
sites of lymphadenopathy identified. Aortic atherosclerotic
calcification noted. No acute vascular findings.

Reproductive:  No mass or other significant abnormality identified.

Other:  None.

Musculoskeletal:  No suspicious bone lesions identified.
IMPRESSION: Significant decrease in size of mass involving the central left
lower lobe and left hilum.

Near complete resolution of mediastinal and abdominal
retroperitoneal lymphadenopathy.

No new or progressive disease within the chest, abdomen, or pelvis.

Colonic diverticulosis, without radiographic evidence of
diverticulitis.

Aortic Atherosclerosis (9FXOQ-KCJ.J).

## 2022-06-07 IMAGING — CT CT CHEST W/ CM
2 of 5 series · 12 of 36 positions shown, 15 images · IV contrast (OMNIPAQUE)
Comparison: 05/02/2021 PET-CT

CLINICAL DATA: Primary Cancer Type: Lung
TECHNIQUE: Multidetector CT imaging of the chest, abdomen and pelvis was
performed following the standard protocol during bolus
administration of intravenous contrast.

CONTRAST:  75mL OMNIPAQUE IOHEXOL 350 MG/ML SOLN

[Series 2: cap with · axial · 0.74mm/px · z∈[-621,-71]mm · 9 of 136 slices shown, 12 images]
[im 13/136  mediastinal]
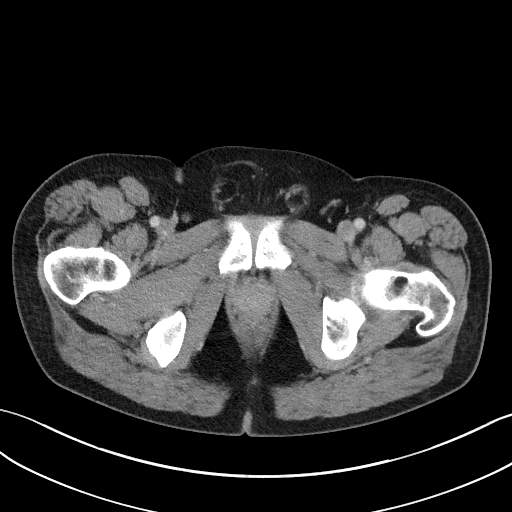
[im 13/136  lung]
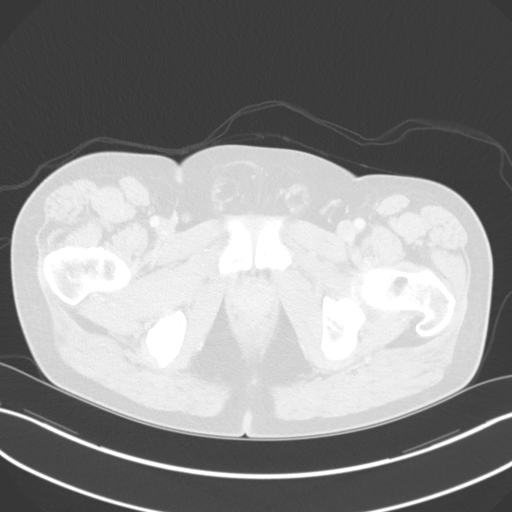
[im 25/136  lung]
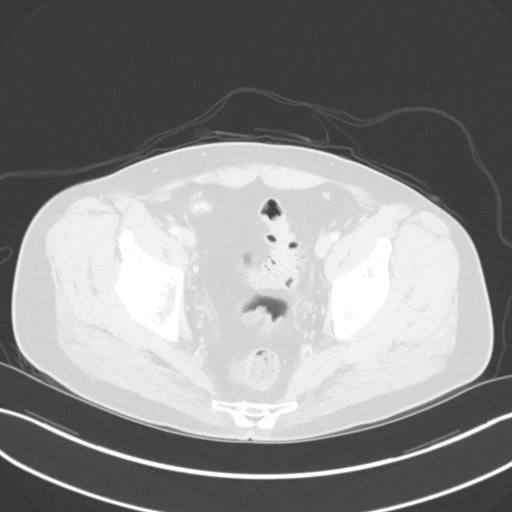
[im 37/136  lung]
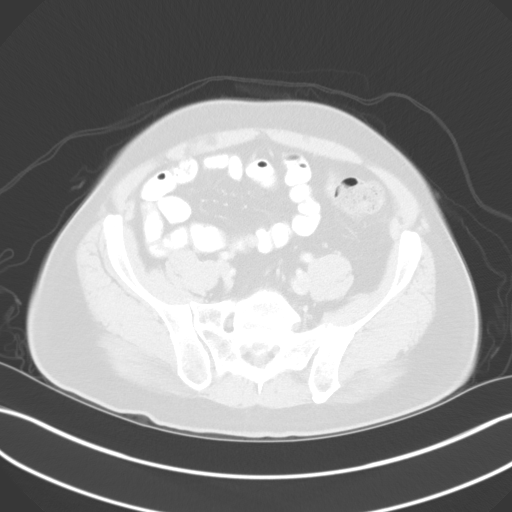
[im 50/136  lung]
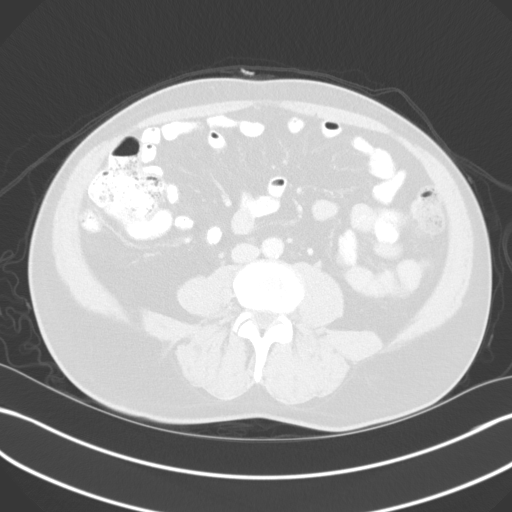
[im 74/136  mediastinal]
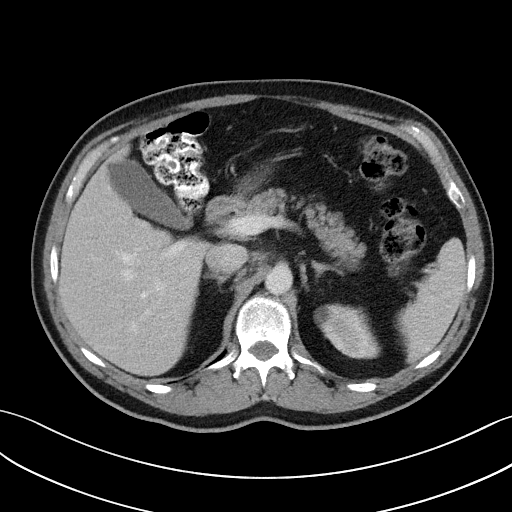
[im 74/136  lung]
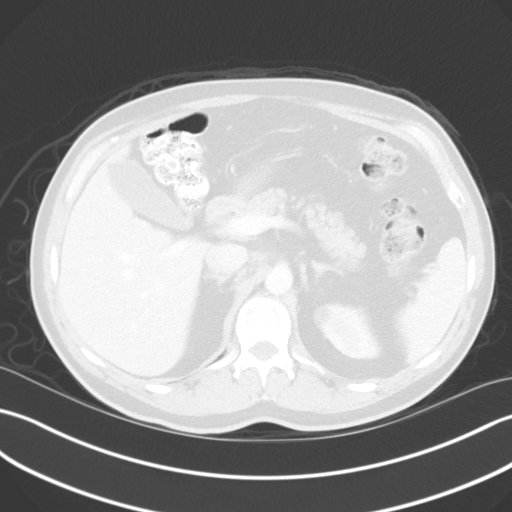
[im 86/136  lung]
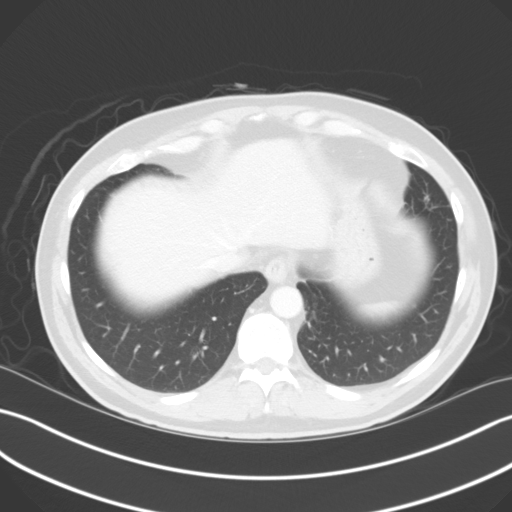
[im 99/136  lung]
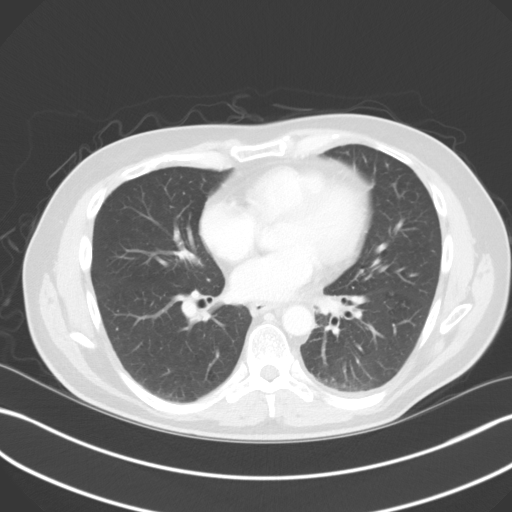
[im 111/136  lung]
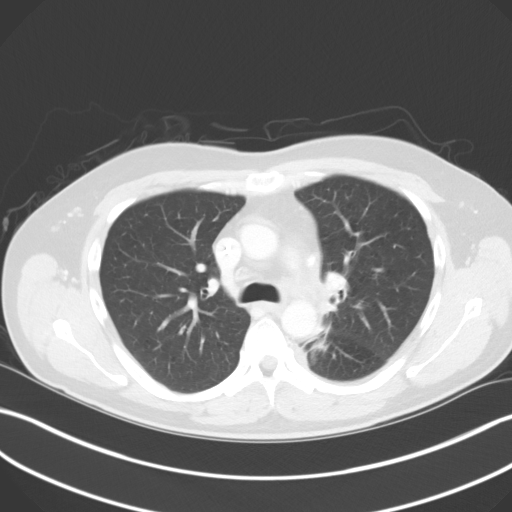
[im 123/136  mediastinal]
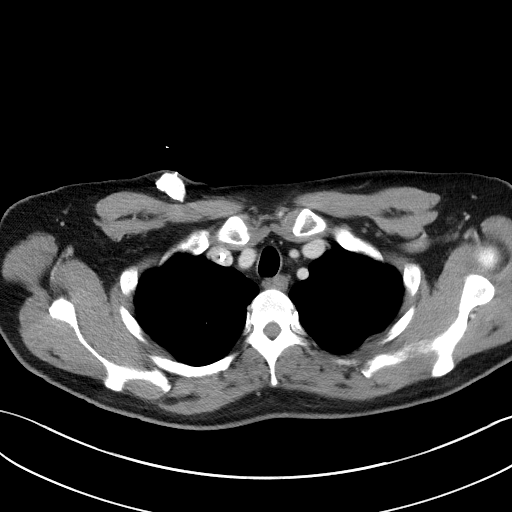
[im 123/136  lung]
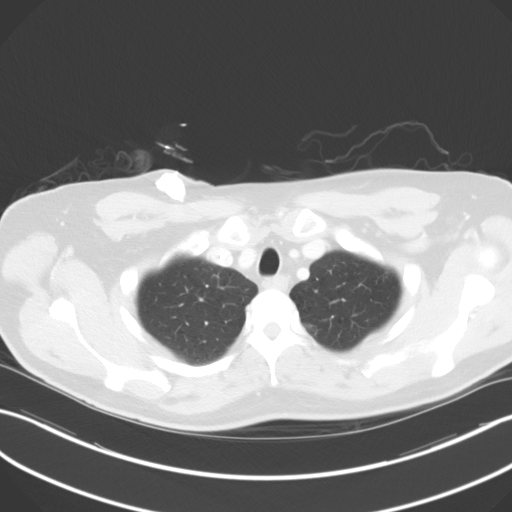

[Series 4: coronals · coronal · 0.79mm/px · 3 of 147 slices shown]
[im 30/147  lung]
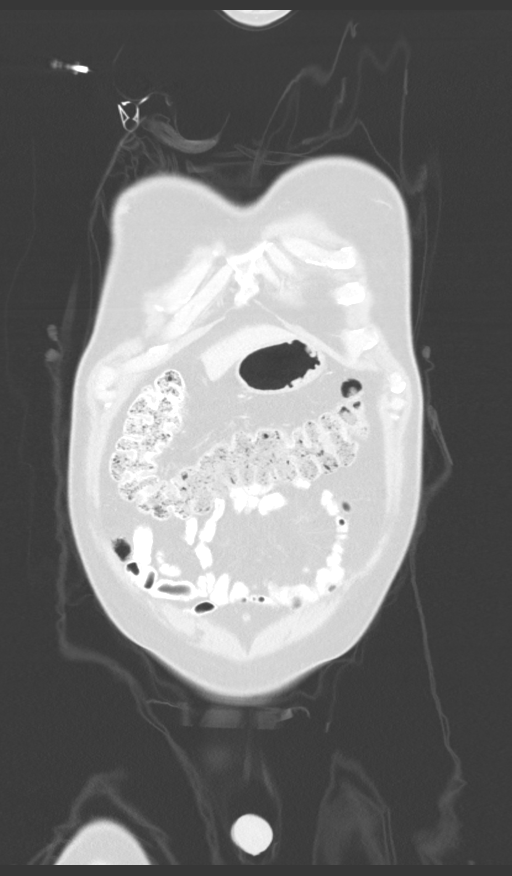
[im 59/147  lung]
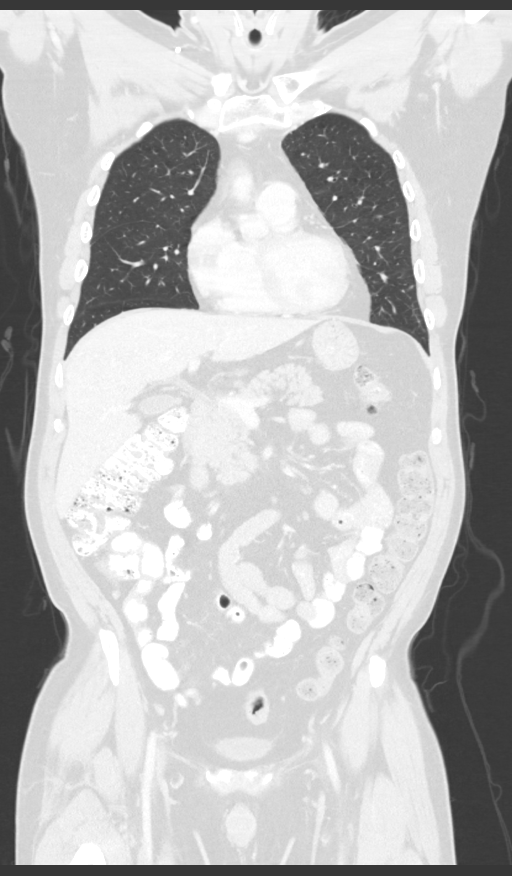
[im 88/147  lung]
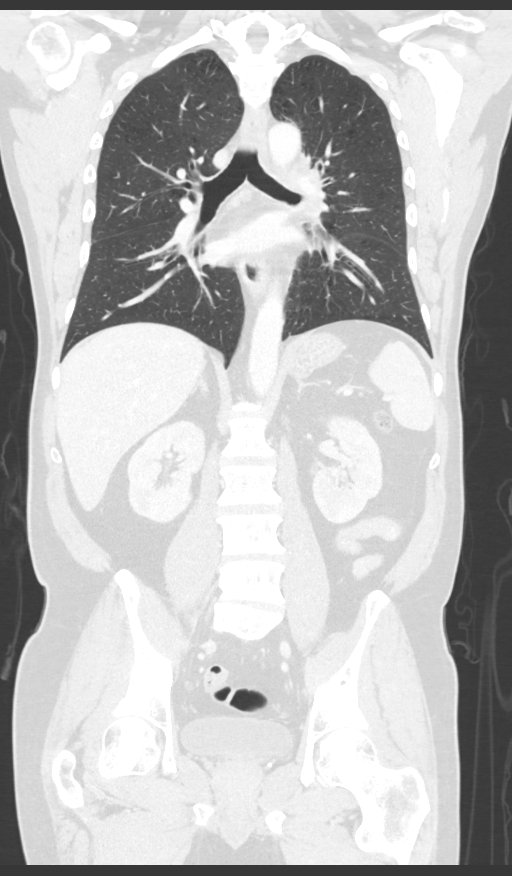

[12 of 36 positions shown; findings below may reference images not displayed]

Imaging Indication: Assess response to therapy

Interval therapy since last imaging? Yes

Initial Cancer Diagnosis

Date: 05/12/2021; Established by: Biopsy-proven

Detailed Pathology: Stage IV non-small cell lung cancer,
adenocarcinoma.

Primary Tumor location:  Left hilum and central left lower lobe.

Surgeries: No.

Chemotherapy: Yes; Ongoing? Yes; Most recent administration:
07/27/2021

Immunotherapy?  Yes; Type: Keytruda; Ongoing? Yes

Radiation therapy? Yes; Date Range: 05/22/2021 - 06/12/2021; Target:
Left lung

EXAM:
CT CHEST, ABDOMEN, AND PELVIS WITH CONTRAST
FINDINGS: CT CHEST FINDINGS

Cardiovascular: No acute findings. Aortic atherosclerotic
calcification noted. Minimal pericardial fluid again seen in the
superior pericardial recess and along the superior margin of the
right atrium.

Mediastinum/Lymph Nodes: There has been near complete resolution of
mediastinal lymphadenopathy since prior study. Largest residual
lymph node in the AP window measures 12 mm short axis compared to 21
mm previously. No new lymphadenopathy identified.

Lungs/Pleura: Significant decrease in size of soft tissue mass
involving the central left lower lobe and left hilum since prior
study. This currently measures 2.4 x 2.1 cm compared to 4.9 by
cm previously. No other suspicious pulmonary nodules or masses
identified. No evidence of pleural effusion.

Musculoskeletal:  No suspicious bone lesions identified.

CT ABDOMEN AND PELVIS FINDINGS

Hepatobiliary: No masses identified. Gallbladder is unremarkable. No
evidence of biliary ductal dilatation.

Pancreas:  No mass or inflammatory changes.

Spleen:  Within normal limits in size and appearance.

Adrenals/Urinary tract: Normal appearance of both adrenal glands.
Small cyst noted in the upper pole the left kidney. No renal masses
or hydronephrosis.

Stomach/Bowel: No evidence of obstruction, inflammatory process, or
abnormal fluid collections. Normal appendix visualized.
Diverticulosis is seen mainly involving the sigmoid colon, however
there is no evidence of diverticulitis.

Vascular/Lymphatic: Decreased retroperitoneal lymphadenopathy is
seen in the aortocaval space, with largest residual lymph node
measuring 9 mm on image 64/2, compared to 21 mm previously. No other
sites of lymphadenopathy identified. Aortic atherosclerotic
calcification noted. No acute vascular findings.

Reproductive:  No mass or other significant abnormality identified.

Other:  None.

Musculoskeletal:  No suspicious bone lesions identified.
IMPRESSION: Significant decrease in size of mass involving the central left
lower lobe and left hilum.

Near complete resolution of mediastinal and abdominal
retroperitoneal lymphadenopathy.

No new or progressive disease within the chest, abdomen, or pelvis.

Colonic diverticulosis, without radiographic evidence of
diverticulitis.

Aortic Atherosclerosis (9FXOQ-KCJ.J).

## 2022-06-07 MED ORDER — SODIUM CHLORIDE 0.9% FLUSH
10.0000 mL | Freq: Once | INTRAVENOUS | Status: AC
  Administered 2022-06-07: 10 mL

## 2022-06-07 MED ORDER — HEPARIN SOD (PORK) LOCK FLUSH 100 UNIT/ML IV SOLN
500.0000 [IU] | Freq: Once | INTRAVENOUS | Status: AC | PRN
  Administered 2022-06-07: 500 [IU]

## 2022-06-07 MED ORDER — SODIUM CHLORIDE 0.9% FLUSH
10.0000 mL | INTRAVENOUS | Status: DC | PRN
  Administered 2022-06-07: 10 mL

## 2022-06-07 MED ORDER — SODIUM CHLORIDE 0.9 % IV SOLN: Freq: Once | INTRAVENOUS | Status: AC

## 2022-06-07 MED ORDER — SODIUM CHLORIDE 0.9 % IV SOLN
200.0000 mg | Freq: Once | INTRAVENOUS | Status: AC
  Administered 2022-06-07: 200 mg via INTRAVENOUS
  Filled 2022-06-07: qty 200

## 2022-06-07 NOTE — Progress Notes (Signed)
Patient reports SOB on exertion. Uses two pillows to sleep at night.

## 2022-06-07 NOTE — Progress Notes (Signed)
Per Cassie's note from today: Labs were reviewed.  Recommend that he proceed with cycle #18 today scheduled. Ok to treat with creatinine of 1.67

## 2022-06-11 ENCOUNTER — Encounter: Payer: Self-pay | Admitting: Internal Medicine

## 2022-06-11 NOTE — Progress Notes (Signed)
Pt has been enrolled w/ the [employer] program for [facility] for $25,000 from 12/03/21 - 12/02/22.  His copay for [name] will be $25.

## 2022-06-18 ENCOUNTER — Other Ambulatory Visit: Payer: Self-pay | Admitting: Medical Oncology

## 2022-06-18 NOTE — Telephone Encounter (Signed)
Folic  Acid refill requested. Sent.

## 2022-06-20 ENCOUNTER — Other Ambulatory Visit (HOSPITAL_COMMUNITY): Payer: Self-pay

## 2022-06-20 ENCOUNTER — Encounter: Payer: Self-pay | Admitting: Physician Assistant

## 2022-06-20 ENCOUNTER — Encounter: Payer: Self-pay | Admitting: Internal Medicine

## 2022-06-20 ENCOUNTER — Other Ambulatory Visit: Payer: Self-pay | Admitting: *Deleted

## 2022-06-20 DIAGNOSIS — C3432 Malignant neoplasm of lower lobe, left bronchus or lung: Secondary | ICD-10-CM

## 2022-06-20 MED ORDER — FOLIC ACID 1 MG PO TABS
1.0000 mg | ORAL_TABLET | Freq: Every day | ORAL | 4 refills | Status: DC
  Filled 2022-06-20: qty 30, 30d supply, fill #0
  Filled 2022-07-23: qty 30, 30d supply, fill #1
  Filled 2022-08-22: qty 30, 30d supply, fill #2
  Filled 2022-10-03: qty 30, 30d supply, fill #3
  Filled 2022-10-30: qty 30, 30d supply, fill #4

## 2022-06-25 ENCOUNTER — Other Ambulatory Visit: Payer: Self-pay

## 2022-06-25 ENCOUNTER — Ambulatory Visit (HOSPITAL_COMMUNITY)
Admission: RE | Admit: 2022-06-25 | Discharge: 2022-06-25 | Disposition: A | Discharge status: Home / Self Care | Payer: 59 | Source: Ambulatory Visit | Attending: Physician Assistant | Admitting: Physician Assistant

## 2022-06-25 DIAGNOSIS — C3432 Malignant neoplasm of lower lobe, left bronchus or lung: Secondary | ICD-10-CM | POA: Diagnosis present

## 2022-06-26 ENCOUNTER — Encounter: Payer: Self-pay | Admitting: Internal Medicine

## 2022-06-26 ENCOUNTER — Encounter: Payer: Self-pay | Admitting: Physician Assistant

## 2022-06-28 ENCOUNTER — Inpatient Hospital Stay: Payer: 59 | Admitting: Internal Medicine

## 2022-06-28 ENCOUNTER — Other Ambulatory Visit: Payer: Self-pay

## 2022-06-28 ENCOUNTER — Inpatient Hospital Stay: Payer: 59

## 2022-06-28 VITALS — BP 125/79 | HR 82 | Temp 98.3°F | Resp 15 | Wt 175.3 lb

## 2022-06-28 DIAGNOSIS — C3432 Malignant neoplasm of lower lobe, left bronchus or lung: Secondary | ICD-10-CM

## 2022-06-28 DIAGNOSIS — Z5112 Encounter for antineoplastic immunotherapy: Secondary | ICD-10-CM

## 2022-06-28 DIAGNOSIS — Z95828 Presence of other vascular implants and grafts: Secondary | ICD-10-CM

## 2022-06-28 LAB — CBC WITH DIFFERENTIAL (CANCER CENTER ONLY)
Abs Immature Granulocytes: 0.03 10*3/uL (ref 0.00–0.07)
Basophils Absolute: 0 10*3/uL (ref 0.0–0.1)
Basophils Relative: 1 %
Eosinophils Absolute: 0.1 10*3/uL (ref 0.0–0.5)
Eosinophils Relative: 2 %
HCT: 31.7 % — ABNORMAL LOW (ref 39.0–52.0)
Hemoglobin: 10.1 g/dL — ABNORMAL LOW (ref 13.0–17.0)
Immature Granulocytes: 0 %
Lymphocytes Relative: 13 %
Lymphs Abs: 1 10*3/uL (ref 0.7–4.0)
MCH: 29 pg (ref 26.0–34.0)
MCHC: 31.9 g/dL (ref 30.0–36.0)
MCV: 91.1 fL (ref 80.0–100.0)
Monocytes Absolute: 0.8 10*3/uL (ref 0.1–1.0)
Monocytes Relative: 10 %
Neutro Abs: 5.9 10*3/uL (ref 1.7–7.7)
Neutrophils Relative %: 74 %
Platelet Count: 354 10*3/uL (ref 150–400)
RBC: 3.48 MIL/uL — ABNORMAL LOW (ref 4.22–5.81)
RDW: 13 % (ref 11.5–15.5)
WBC Count: 7.9 10*3/uL (ref 4.0–10.5)
nRBC: 0 % (ref 0.0–0.2)

## 2022-06-28 LAB — CMP (CANCER CENTER ONLY)
ALT: 9 U/L (ref 0–44)
AST: 10 U/L — ABNORMAL LOW (ref 15–41)
Albumin: 3.6 g/dL (ref 3.5–5.0)
Alkaline Phosphatase: 96 U/L (ref 38–126)
Anion gap: 5 (ref 5–15)
BUN: 14 mg/dL (ref 6–20)
CO2: 28 mmol/L (ref 22–32)
Calcium: 9.1 mg/dL (ref 8.9–10.3)
Chloride: 107 mmol/L (ref 98–111)
Creatinine: 1.56 mg/dL — ABNORMAL HIGH (ref 0.61–1.24)
GFR, Estimated: 54 mL/min — ABNORMAL LOW (ref >60)
Glucose, Bld: 90 mg/dL (ref 70–99)
Potassium: 4.3 mmol/L (ref 3.5–5.1)
Sodium: 140 mmol/L (ref 135–145)
Total Bilirubin: 0.3 mg/dL (ref 0.3–1.2)
Total Protein: 7.6 g/dL (ref 6.5–8.1)

## 2022-06-28 LAB — IRON AND IRON BINDING CAPACITY (CC-WL,HP ONLY)
Iron: 38 ug/dL — ABNORMAL LOW (ref 45–182)
Saturation Ratios: 12 % — ABNORMAL LOW (ref 17.9–39.5)
TIBC: 316 ug/dL (ref 250–450)
UIBC: 278 ug/dL (ref 117–376)

## 2022-06-28 LAB — TSH: TSH: 1.9 u[IU]/mL (ref 0.350–4.500)

## 2022-06-28 LAB — FERRITIN: Ferritin: 298 ng/mL (ref 24–336)

## 2022-06-28 MED ORDER — SODIUM CHLORIDE 0.9 % IV SOLN: Freq: Once | INTRAVENOUS | Status: AC

## 2022-06-28 MED ORDER — SODIUM CHLORIDE 0.9% FLUSH
10.0000 mL | Freq: Once | INTRAVENOUS | Status: AC
  Administered 2022-06-28: 10 mL

## 2022-06-28 MED ORDER — SODIUM CHLORIDE 0.9 % IV SOLN
200.0000 mg | Freq: Once | INTRAVENOUS | Status: AC
  Administered 2022-06-28: 200 mg via INTRAVENOUS
  Filled 2022-06-28: qty 200

## 2022-06-28 MED ORDER — HEPARIN SOD (PORK) LOCK FLUSH 100 UNIT/ML IV SOLN
500.0000 [IU] | Freq: Once | INTRAVENOUS | Status: AC | PRN
  Administered 2022-06-28: 500 [IU]

## 2022-06-28 MED ORDER — SODIUM CHLORIDE 0.9% FLUSH
10.0000 mL | INTRAVENOUS | Status: DC | PRN
  Administered 2022-06-28: 10 mL

## 2022-06-28 NOTE — Progress Notes (Signed)
Per Dr. Julien Nordmann, okay to treat with creatinine 1.56

## 2022-06-28 NOTE — Patient Instructions (Signed)
[facility] MEDICAL [facility]COLOGY  Discharge Instructions: Thank you for choosing [facility] to provide your oncology and hematology care.   If you have a lab appointment with the Cancer Center, please go directly to the Cancer Center and check in at the registration area.   Wear comfortable clothing and clothing appropriate for easy access to any Portacath or PICC line.   We strive to give you quality time with your provider. You may need to reschedule your appointment if you arrive late (15 or more minutes).  Arriving late affects you and other patients whose appointments are after yours.  Also, if you miss three or more appointments without notifying the office, you may be dismissed from the clinic at the provider's discretion.      For prescription refill requests, have your pharmacy contact our office and allow 72 hours for refills to be completed.    Today you received the following chemotherapy and/or immunotherapy agents: Keytruda      To help prevent nausea and vomiting after your treatment, we encourage you to take your nausea medication as directed.  BELOW ARE SYMPTOMS THAT SHOULD BE REPORTED IMMEDIATELY: *FEVER GREATER THAN 100.4 F (38 C) OR HIGHER *CHILLS OR SWEATING *NAUSEA AND VOMITING THAT IS NOT CONTROLLED WITH YOUR NAUSEA MEDICATION *UNUSUAL SHORTNESS OF BREATH *UNUSUAL BRUISING OR BLEEDING *URINARY PROBLEMS (pain or burning when urinating, or frequent urination) *BOWEL PROBLEMS (unusual diarrhea, constipation, pain near the anus) TENDERNESS IN MOUTH AND THROAT WITH OR WITHOUT PRESENCE OF ULCERS (sore throat, sores in mouth, or a toothache) UNUSUAL RASH, SWELLING OR PAIN  UNUSUAL VAGINAL DISCHARGE OR ITCHING   Items with * indicate a potential emergency and should be followed up as soon as possible or go to the Emergency Department if any problems should occur.  Please show the CHEMOTHERAPY ALERT CARD or IMMUNOTHERAPY ALERT CARD at check-in to  the Emergency Department and triage nurse.  Should you have questions after your visit or need to cancel or reschedule your appointment, please contact [facility]  Dept: [phone]  and follow the prompts.  Office hours are 8:00 a.m. to 4:30 p.m. Monday - Friday. Please note that voicemails left after 4:00 p.m. may not be returned until the following business day.  We are closed weekends and major holidays. You have access to a nurse at all times for urgent questions. Please call the main number to the clinic Dept: [phone] and follow the prompts.   For any non-urgent questions, you may also contact your provider using MyChart. We now offer e-Visits for anyone 18 and older to request care online for non-urgent symptoms. For details visit [email or web address].   Also download the MyChart app! Go to the app store, search "MyChart", open the app, select [facility], and log in with your MyChart username and password.  Masks are optional in the cancer centers. If you would like for your care team to wear a mask while they are taking care of you, please let them know. For doctor visits, patients may have with them one support Donnielle Addison who is at least 48 years old. At this time, visitors are not allowed in the infusion area. 

## 2022-06-28 NOTE — Progress Notes (Signed)
[facility] [phone]   [phone]  OFFICE PROGRESS NOTE  [name], MD [place] [address]  DIAGNOSIS:  Stage IV (T2b, N2, M1 B) non-small cell lung cancer, adenocarcinoma presented with large central left lower lobe lung mass with left hilar and mediastinal lymphadenopathy as well as abdominal retroperitoneal lymphadenopathy diagnosed in May 2022.  The patient also has bilateral parotid gland nodules that need close monitoring.  [facility] MOLECULAR STUDY: Results with Therapy Associations BIOMARKER METHOD ANALYTE RESULT THERAPY ASSOCIATION BIOMARKER LEVEL* .PD-L1 (22c3) IHC Protein Positive, TPS: 50% BENEFIT cemiplimab, pembrolizumab Level 1 .PD-L1 (28-8) IHC Protein Positive  1+, 50% BENEFIT nivolumab/ipilimumab combination Level 1 .TMB Seq DNA-Tumor High, 18 mut/Mb BENEFIT pembrolizumab Level 2 . alectinib, ceritinib, crizotinib, lorlatinib Level 1 . IHC Protein Negative  0 brigatinib Level 2 . ALK Seq RNA-Tumor Fusion Not Detected LACK OF BENEFIT alectinib, brigatinib, ceritinib, crizotinib, lorlatinib Level 2 .BRAF Seq DNA-Tumor Mutation Not Detected LACK OF BENEFIT dabrafenib and trametinib combination therapy, vemurafenib Level 2 .EGFR Seq DNA-Tumor Mutation Not Detected LACK OF BENEFIT erlotinib, gefitinib Level 2 .KRAS Seq DNA-Tumor Mutation Not Detected LACK OF BENEFIT sotorasib Level 2 .RET Seq RNA-Tumor Fusion Not Detected LACK OF BENEFIT pralsetinib, selpercatinib Level 2 .ROS1 Seq RNA-Tumor Fusion Not Detected LACK OF BENEFIT ceritinib, crizotinib, entrectinib, lorlatinib Level 2 . CNA-Seq DNA-Tumor Amplification Not Detected .MET Seq RNA-Tumor Variant Transcript Not Detected LACK OF BENEFIT crizotinib Level 3  PRIOR THERAPY: Palliative radiotherapy to the large central left lower lobe lung mass under the care of Dr. Lisbeth Renshaw.  CURRENT THERAPY: Systemic chemotherapy with carboplatin for AUC  of 5, Alimta 500 Mg/M2 and possibly Keytruda 200 Mg IV every 3 weeks.  First dose expected June 14, 2021.  Starting from cycle #5 the patient is on maintenance treatment with Alimta and Keytruda every 3 weeks.  Status post 18 cycles of treatment.  Starting from cycle #12 the dose of Alimta to 400 Mg/M2 because of his increasing fatigue and side effects.  Alimta was discontinued secondary to renal insufficiency.  INTERVAL HISTORY: CATLIN AYCOCK 46 y.o. male returns to the clinic today for follow-up visit.  The patient is feeling fine today with no concerning complaints except for back pain started few weeks ago after his treatment but it is getting better.  He denied having any current chest pain, shortness of breath, cough or hemoptysis.  He has no nausea, vomiting, diarrhea or constipation.  He has no headache or visual changes.  He denied having any fever or chills.  He denied having any significant weight loss or night sweats.  He continues to tolerate his treatment with Keytruda fairly well.  He was seen by nephrology recently for his chronic kidney disease and the patient was advised to continue with monitoring for now and avoid some of the nephrotoxic drug.  He is here today with repeat CT scan of the chest, abdomen and pelvis for restaging of his disease.  MEDICAL HISTORY: Past Medical History:  Diagnosis Date   Chronic back pain    Eczema    GERD (gastroesophageal reflux disease)    Lung cancer (HCC)     ALLERGIES:  is allergic to penicillins and zofran [ondansetron].  MEDICATIONS:  Current Outpatient Medications  Medication Sig Dispense Refill   acetaminophen (TYLENOL) 500 MG tablet Take 500-1,000 mg by mouth every 6 (six) hours as needed (for back pain.). (Patient not taking: Reported on 1/70/0174)     folic acid (  FOLVITE) 1 MG tablet Take 1 tablet (1 mg total) by mouth daily. 30 tablet 4   lidocaine-prilocaine (EMLA) cream Apply 1 application topically as needed. 30 g 2    prochlorperazine (COMPAZINE) 10 MG tablet Take 1 tablet (10 mg total) by mouth every 6 (six) hours as needed for nausea or vomiting. (Patient not taking: Reported on 05/17/2022) 30 tablet 0   No current facility-administered medications for this visit.    SURGICAL HISTORY:  Past Surgical History:  Procedure Laterality Date   BRONCHIAL BRUSHINGS  05/12/2021   Procedure: BRONCHIAL BRUSHINGS;  Surgeon: Josephine Igo, DO;  Location: [facility];  Service: Pulmonary;;   BRONCHIAL DILITATION  05/12/2021   Procedure: BRONCHIAL DILITATION;  Surgeon: Josephine Igo, DO;  Location: [facility];  Service: Pulmonary;;   BRONCHIAL NEEDLE ASPIRATION BIOPSY  05/12/2021   Procedure: BRONCHIAL NEEDLE ASPIRATION BIOPSIES;  Surgeon: Josephine Igo, DO;  Location: [facility];  Service: Pulmonary;;   BRONCHIAL WASHINGS  05/12/2021   Procedure: BRONCHIAL WASHINGS;  Surgeon: Josephine Igo, DO;  Location: [facility];  Service: Pulmonary;;   CRYOTHERAPY  05/12/2021   Procedure: CRYOTHERAPY;  Surgeon: Josephine Igo, DO;  Location: [facility];  Service: Pulmonary;;   HEMOSTASIS CONTROL  05/12/2021   Procedure: HEMOSTASIS CONTROL;  Surgeon: Josephine Igo, DO;  Location: [facility];  Service: Pulmonary;;  epi injection   IR IMAGING GUIDED PORT INSERTION  06/29/2021   Spine injection     Pain control   VIDEO BRONCHOSCOPY WITH ENDOBRONCHIAL ULTRASOUND N/A 05/12/2021   Procedure: VIDEO BRONCHOSCOPY WITH ENDOBRONCHIAL ULTRASOUND;  Surgeon: Josephine Igo, DO;  Location: [facility];  Service: Pulmonary;  Laterality: N/A;   WISDOM TOOTH EXTRACTION     no anesthesia involed    REVIEW OF SYSTEMS:  Constitutional: negative Eyes: negative Ears, nose, mouth, throat, and face: negative Respiratory: negative Cardiovascular: negative Gastrointestinal: negative Genitourinary:negative Integument/breast: negative Hematologic/lymphatic: negative Musculoskeletal:positive for back pain Neurological:  negative Behavioral/Psych: negative Endocrine: negative Allergic/Immunologic: negative   PHYSICAL EXAMINATION: General appearance: alert, cooperative, and no distress Head: Normocephalic, without obvious abnormality, atraumatic Neck: no adenopathy, no JVD, supple, symmetrical, trachea midline, and thyroid not enlarged, symmetric, no tenderness/mass/nodules Lymph nodes: Cervical, supraclavicular, and axillary nodes normal. Resp: clear to auscultation bilaterally Back: symmetric, no curvature. ROM normal. No CVA tenderness. Cardio: regular rate and rhythm, S1, S2 normal, no murmur, click, rub or gallop GI: soft, non-tender; bowel sounds normal; no masses,  no organomegaly Extremities: extremities normal, atraumatic, no cyanosis or edema Neurologic: Alert and oriented X 3, normal strength and tone. Normal symmetric reflexes. Normal coordination and gait  ECOG PERFORMANCE STATUS: 1 - Symptomatic but completely ambulatory  Blood pressure 125/79, pulse 82, temperature 98.3 F (36.8 C), temperature source Oral, resp. rate 15, weight 175 lb 4.8 oz (79.5 kg), SpO2 99 %.  LABORATORY DATA: Lab Results  Component Value Date   WBC 7.5 06/07/2022   HGB 9.8 (L) 06/07/2022   HCT 30.2 (L) 06/07/2022   MCV 91.8 06/07/2022   PLT 326 06/07/2022      Chemistry      Component Value Date/Time   NA 139 06/07/2022 1054   K 4.3 06/07/2022 1054   CL 106 06/07/2022 1054   CO2 28 06/07/2022 1054   BUN 18 06/07/2022 1054   CREATININE 1.67 (H) 06/07/2022 1054      Component Value Date/Time   CALCIUM 9.3 06/07/2022 1054   ALKPHOS 96 06/07/2022 1054   AST 10 (L) 06/07/2022 1054  ALT 9 06/07/2022 1054   BILITOT 0.3 06/07/2022 1054       RADIOGRAPHIC STUDIES: CT Chest Wo Contrast  Result Date: 06/25/2022 CLINICAL DATA:  Non-small cell lung cancer, metastatic, assess treatment response. Shortness of breath. * Tracking Code: BO * EXAM: CT CHEST, ABDOMEN AND PELVIS WITHOUT CONTRAST TECHNIQUE:  Multidetector CT imaging of the chest, abdomen and pelvis was performed following the standard protocol without IV contrast. RADIATION DOSE REDUCTION: This exam was performed according to the departmental dose-optimization program which includes automated exposure control, adjustment of the mA and/or kV according to patient size and/or use of iterative reconstruction technique. COMPARISON:  03/01/2022. FINDINGS: CT CHEST FINDINGS Cardiovascular: Right IJ Port-A-Cath terminates in the SVC. Atherosclerotic calcification of the aorta. Heart is at the upper limits of normal in size. No pericardial effusion. Mediastinum/Nodes: No pathologically enlarged mediastinal or axillary lymph nodes. Soft tissue thickening in the AP window, unchanged. Hilar regions are difficult to definitively evaluate without IV contrast. Esophagus is grossly unremarkable. Lungs/Pleura: Centrilobular emphysema. Scattered scarring in the right lung. Post radiation parenchymal retraction, architectural distortion and bronchiectasis in the medial left hemithorax. Moderate left pleural effusion with chronic appearing volume loss in the adjacent left lower lobe, similar. Airway is unremarkable. Musculoskeletal: No worrisome lytic or sclerotic lesions. CT ABDOMEN PELVIS FINDINGS Hepatobiliary: Liver and gallbladder are unremarkable. No biliary ductal dilatation. Pancreas: Negative. Spleen: Negative. Adrenals/Urinary Tract: Adrenal glands and right kidney are unremarkable. 2.8 cm low-attenuation lesion in the upper pole left kidney, likely a cyst. No specific follow-up necessary. Ureters are decompressed. Bladder is grossly unremarkable. Stomach/Bowel: Stomach, small bowel, appendix and colon are unremarkable. Vascular/Lymphatic: Atherosclerotic calcification of the aorta. No pathologically enlarged lymph nodes. Reproductive: Prostate is visualized. Other: No free fluid.  Mesenteries and peritoneum are unremarkable. Musculoskeletal: No worrisome lytic  or sclerotic lesions. IMPRESSION: 1. Post treatment scarring in the AP window and left hemithorax. No evidence recurrent or metastatic disease. 2. Moderate left pleural effusion with chronic appearing volume loss in the adjacent left lower lobe, stable. 3.  Aortic atherosclerosis (ICD10-I70.0). 4.  Emphysema (ICD10-J43.9). Electronically Signed   By: Lorin Picket M.D.   On: 06/25/2022 11:04   CT Abdomen Pelvis Wo Contrast  Result Date: 06/25/2022 CLINICAL DATA:  Non-small cell lung cancer, metastatic, assess treatment response. Shortness of breath. * Tracking Code: BO * EXAM: CT CHEST, ABDOMEN AND PELVIS WITHOUT CONTRAST TECHNIQUE: Multidetector CT imaging of the chest, abdomen and pelvis was performed following the standard protocol without IV contrast. RADIATION DOSE REDUCTION: This exam was performed according to the departmental dose-optimization program which includes automated exposure control, adjustment of the mA and/or kV according to patient size and/or use of iterative reconstruction technique. COMPARISON:  03/01/2022. FINDINGS: CT CHEST FINDINGS Cardiovascular: Right IJ Port-A-Cath terminates in the SVC. Atherosclerotic calcification of the aorta. Heart is at the upper limits of normal in size. No pericardial effusion. Mediastinum/Nodes: No pathologically enlarged mediastinal or axillary lymph nodes. Soft tissue thickening in the AP window, unchanged. Hilar regions are difficult to definitively evaluate without IV contrast. Esophagus is grossly unremarkable. Lungs/Pleura: Centrilobular emphysema. Scattered scarring in the right lung. Post radiation parenchymal retraction, architectural distortion and bronchiectasis in the medial left hemithorax. Moderate left pleural effusion with chronic appearing volume loss in the adjacent left lower lobe, similar. Airway is unremarkable. Musculoskeletal: No worrisome lytic or sclerotic lesions. CT ABDOMEN PELVIS FINDINGS Hepatobiliary: Liver and gallbladder  are unremarkable. No biliary ductal dilatation. Pancreas: Negative. Spleen: Negative. Adrenals/Urinary Tract: Adrenal glands and right kidney are  unremarkable. 2.8 cm low-attenuation lesion in the upper pole left kidney, likely a cyst. No specific follow-up necessary. Ureters are decompressed. Bladder is grossly unremarkable. Stomach/Bowel: Stomach, small bowel, appendix and colon are unremarkable. Vascular/Lymphatic: Atherosclerotic calcification of the aorta. No pathologically enlarged lymph nodes. Reproductive: Prostate is visualized. Other: No free fluid.  Mesenteries and peritoneum are unremarkable. Musculoskeletal: No worrisome lytic or sclerotic lesions. IMPRESSION: 1. Post treatment scarring in the AP window and left hemithorax. No evidence recurrent or metastatic disease. 2. Moderate left pleural effusion with chronic appearing volume loss in the adjacent left lower lobe, stable. 3.  Aortic atherosclerosis (ICD10-I70.0). 4.  Emphysema (ICD10-J43.9). Electronically Signed   By: Lorin Picket M.D.   On: 06/25/2022 11:04    ASSESSMENT AND PLAN: This is a very pleasant 46 years old white male recently diagnosed with a stage IV (T2b, N2, M1 B) non-small cell lung cancer, adenocarcinoma presented with large central left lower lobe lung mass in addition to left hilar and mediastinal lymphadenopathy as well as retroperitoneal abdominal lymph node diagnosed in May 2022.  The patient is currently undergoing palliative radiotherapy to the large left lower lobe lung mass under the care of of Dr. Lisbeth Renshaw.  He is expected to complete this course of treatment on June 12, 2021. MRI of the brain showed no evidence of metastatic disease to the brain. His molecular studies by [facility] showed no actionable mutations and PD-L1 expression of 50%. He is currently undergoing systemic chemotherapy with carboplatin for AUC of 5, Alimta 500 Mg/M2 and Keytruda 200 Mg IV every 3 weeks.  Starting from cycle #5 he is on maintenance  treatment with Alimta at 400 Mg/M2 and Keytruda 200 Mg IV every 3 weeks status post 18 cycles.  He is currently on single agent Keytruda secondary to renal insufficiency. The patient has been tolerating his treatment with Keytruda fairly well with no concerning adverse effects. He had repeat CT scan of the chest, abdomen and pelvis performed recently.  I personally and independently reviewed the scans and discussed the result with the patient today and showed him the images. His scan showed no concerning findings for disease progression. I recommended for him to continue his current treatment with Keytruda with the same dose. I will see him back for follow-up visit in 3 weeks for evaluation before the next cycle of his treatment. For the renal insufficiency he was seen by nephrology and currently on observation. The patient was advised to call immediately if he has any other concerning symptoms in the interval.  The patient voices understanding of current disease status and treatment options and is in agreement with the current care plan. All questions were answered. The patient knows to call the clinic with any problems, questions or concerns. We can certainly see the patient much sooner if necessary.  Disclaimer: This note was dictated with voice recognition software. Similar sounding words can inadvertently be transcribed and may not be corrected upon review.

## 2022-06-29 ENCOUNTER — Other Ambulatory Visit: Payer: Self-pay

## 2022-07-03 ENCOUNTER — Other Ambulatory Visit: Payer: Self-pay

## 2022-07-12 ENCOUNTER — Encounter: Payer: Self-pay | Admitting: Physician Assistant

## 2022-07-12 ENCOUNTER — Encounter: Payer: Self-pay | Admitting: Internal Medicine

## 2022-07-13 NOTE — Progress Notes (Unsigned)
[facility] OFFICE PROGRESS NOTE  [name], MD [place] [address]  DIAGNOSIS: Stage IV (T2b, N2, M1 B) non-small cell lung cancer, adenocarcinoma presented with large central left lower lobe lung mass with left hilar and mediastinal lymphadenopathy as well as abdominal retroperitoneal lymphadenopathy diagnosed in May 2022.  The patient also has bilateral parotid gland nodules that need close monitoring.   [facility] MOLECULAR STUDY: Results with Therapy Associations BIOMARKER METHOD ANALYTE RESULT THERAPY ASSOCIATION BIOMARKER LEVEL* .PD-L1 (22c3) IHC Protein Positive, TPS: 50% BENEFIT cemiplimab, pembrolizumab Level 1 .PD-L1 (28-8) IHC Protein Positive  1+, 50% BENEFIT nivolumab/ipilimumab combination Level 1 .TMB Seq DNA-Tumor High, 18 mut/Mb BENEFIT pembrolizumab Level 2 . alectinib, ceritinib, crizotinib, lorlatinib Level 1 . IHC Protein Negative  0 brigatinib Level 2 . ALK Seq RNA-Tumor Fusion Not Detected LACK OF BENEFIT alectinib, brigatinib, ceritinib, crizotinib, lorlatinib Level 2 .BRAF Seq DNA-Tumor Mutation Not Detected LACK OF BENEFIT dabrafenib and trametinib combination therapy, vemurafenib Level 2 .EGFR Seq DNA-Tumor Mutation Not Detected LACK OF BENEFIT erlotinib, gefitinib Level 2 .KRAS Seq DNA-Tumor Mutation Not Detected LACK OF BENEFIT sotorasib Level 2 .RET Seq RNA-Tumor Fusion Not Detected LACK OF BENEFIT pralsetinib, selpercatinib Level 2 .ROS1 Seq RNA-Tumor Fusion Not Detected LACK OF BENEFIT ceritinib, crizotinib, entrectinib, lorlatinib Level 2 . CNA-Seq DNA-Tumor Amplification Not Detected . MET Seq RNA-Tumor Variant Transcript Not Detected LACK OF BENEFIT crizotinib Level 3  PRIOR THERAPY:  Palliative radiotherapy to the large central left lower lobe lung mass under the care of Dr. Lisbeth Renshaw. Last treatment on 06/12/21.  CURRENT THERAPY: Systemic chemotherapy with carboplatin for AUC of 5, Alimta 500  Mg/M2 and Keytruda 200 Mg IV every 3 weeks.  First dose expected June 14, 2021.  Status post 19 cycles of treatment.  Starting from cycle #5, the patient will begin maintenance Keytruda and Alimta.  Alimta was reduced to 400 mg per metered squared starting from cycle number 12 due to fatigue.  Alimta discontinued starting from cycle #13 due to renal insufficiency   INTERVAL HISTORY: Cameron Harper 46 y.o. male returns to clinic today for follow-up visit.  The patient is feeling fairly well today without any concerning complaints.  The patient is currently undergoing single leg immunotherapy with Keytruda.  Alimta was discontinued secondary to elevated creatinine and fatigue.  The patient was seen by nephrology to establish care and they are monitoring him for now.  The patient denies any recent fever, chills, or night sweats.  Weight loss?  He is eating better.  He reports his baseline occasional dyspnea on exertion with certain strenuous activities such as running or walking up a hill.  Denies any chest pain, cough, or hemoptysis.  Denies any nausea, vomiting, diarrhea, or constipation.  Denies any headache or visual changes.  Denies any rashes or skin changes.  He is here today for evaluation and repeat blood work before starting cycle #20.     MEDICAL HISTORY: Past Medical History:  Diagnosis Date   Chronic back pain    Eczema    GERD (gastroesophageal reflux disease)    Lung cancer (HCC)     ALLERGIES:  is allergic to penicillins and zofran [ondansetron].  MEDICATIONS:  Current Outpatient Medications  Medication Sig Dispense Refill   acetaminophen (TYLENOL) 500 MG tablet Take 500-1,000 mg by mouth every 6 (six) hours as needed (for back pain.). (Patient not taking: Reported on 4/48/1856)     folic acid (FOLVITE) 1 MG tablet Take 1 tablet (1 mg total)  by mouth daily. 30 tablet 4   lidocaine-prilocaine (EMLA) cream Apply 1 application topically as needed. 30 g 2   prochlorperazine  (COMPAZINE) 10 MG tablet Take 1 tablet (10 mg total) by mouth every 6 (six) hours as needed for nausea or vomiting. (Patient not taking: Reported on 05/17/2022) 30 tablet 0   No current facility-administered medications for this visit.    SURGICAL HISTORY:  Past Surgical History:  Procedure Laterality Date   BRONCHIAL BRUSHINGS  05/12/2021   Procedure: BRONCHIAL BRUSHINGS;  Surgeon: Garner Nash, DO;  Location: [facility];  Service: Pulmonary;;   BRONCHIAL DILITATION  05/12/2021   Procedure: BRONCHIAL DILITATION;  Surgeon: Garner Nash, DO;  Location: [facility];  Service: Pulmonary;;   BRONCHIAL NEEDLE ASPIRATION BIOPSY  05/12/2021   Procedure: BRONCHIAL NEEDLE ASPIRATION BIOPSIES;  Surgeon: Garner Nash, DO;  Location: [facility];  Service: Pulmonary;;   BRONCHIAL WASHINGS  05/12/2021   Procedure: BRONCHIAL WASHINGS;  Surgeon: Garner Nash, DO;  Location: [facility];  Service: Pulmonary;;   CRYOTHERAPY  05/12/2021   Procedure: CRYOTHERAPY;  Surgeon: Garner Nash, DO;  Location: [facility];  Service: Pulmonary;;   HEMOSTASIS CONTROL  05/12/2021   Procedure: HEMOSTASIS CONTROL;  Surgeon: Garner Nash, DO;  Location: [facility];  Service: Pulmonary;;  epi injection   IR IMAGING GUIDED PORT INSERTION  06/29/2021   Spine injection     Pain control   VIDEO BRONCHOSCOPY WITH ENDOBRONCHIAL ULTRASOUND N/A 05/12/2021   Procedure: VIDEO BRONCHOSCOPY WITH ENDOBRONCHIAL ULTRASOUND;  Surgeon: Garner Nash, DO;  Location: [facility];  Service: Pulmonary;  Laterality: N/A;   WISDOM TOOTH EXTRACTION     no anesthesia involed    REVIEW OF SYSTEMS:   Review of Systems  Constitutional: Negative for appetite change, chills, fatigue, fever and unexpected weight change.  HENT:   Negative for mouth sores, nosebleeds, sore throat and trouble swallowing.   Eyes: Negative for eye problems and icterus.  Respiratory: Negative for cough, hemoptysis, shortness of breath and  wheezing.   Cardiovascular: Negative for chest pain and leg swelling.  Gastrointestinal: Negative for abdominal pain, constipation, diarrhea, nausea and vomiting.  Genitourinary: Negative for bladder incontinence, difficulty urinating, dysuria, frequency and hematuria.   Musculoskeletal: Negative for back pain, gait problem, neck pain and neck stiffness.  Skin: Negative for itching and rash.  Neurological: Negative for dizziness, extremity weakness, gait problem, headaches, light-headedness and seizures.  Hematological: Negative for adenopathy. Does not bruise/bleed easily.  Psychiatric/Behavioral: Negative for confusion, depression and sleep disturbance. The patient is not nervous/anxious.     PHYSICAL EXAMINATION:  There were no vitals taken for this visit.  ECOG PERFORMANCE STATUS: {CHL ONC ECOG [id number]  Physical Exam  Constitutional: Oriented to person, place, and time and well-developed, well-nourished, and in no distress. No distress.  HENT:  Head: Normocephalic and atraumatic.  Mouth/Throat: Oropharynx is clear and moist. No oropharyngeal exudate.  Eyes: Conjunctivae are normal. Right eye exhibits no discharge. Left eye exhibits no discharge. No scleral icterus.  Neck: Normal range of motion. Neck supple.  Cardiovascular: Normal rate, regular rhythm, normal heart sounds and intact distal pulses.   Pulmonary/Chest: Effort normal and breath sounds normal. No respiratory distress. No wheezes. No rales.  Abdominal: Soft. Bowel sounds are normal. Exhibits no distension and no mass. There is no tenderness.  Musculoskeletal: Normal range of motion. Exhibits no edema.  Lymphadenopathy:    No cervical adenopathy.  Neurological: Alert and oriented to person, place, and time. Exhibits normal  muscle tone. Gait normal. Coordination normal.  Skin: Skin is warm and dry. No rash noted. Not diaphoretic. No erythema. No pallor.  Psychiatric: Mood, memory and judgment normal.  Vitals  reviewed.  LABORATORY DATA: Lab Results  Component Value Date   WBC 7.9 06/28/2022   HGB 10.1 (L) 06/28/2022   HCT 31.7 (L) 06/28/2022   MCV 91.1 06/28/2022   PLT 354 06/28/2022      Chemistry      Component Value Date/Time   NA 140 06/28/2022 1022   K 4.3 06/28/2022 1022   CL 107 06/28/2022 1022   CO2 28 06/28/2022 1022   BUN 14 06/28/2022 1022   CREATININE 1.56 (H) 06/28/2022 1022      Component Value Date/Time   CALCIUM 9.1 06/28/2022 1022   ALKPHOS 96 06/28/2022 1022   AST 10 (L) 06/28/2022 1022   ALT 9 06/28/2022 1022   BILITOT 0.3 06/28/2022 1022       RADIOGRAPHIC STUDIES:  CT Chest Wo Contrast  Result Date: 06/25/2022 CLINICAL DATA:  Non-small cell lung cancer, metastatic, assess treatment response. Shortness of breath. * Tracking Code: BO * EXAM: CT CHEST, ABDOMEN AND PELVIS WITHOUT CONTRAST TECHNIQUE: Multidetector CT imaging of the chest, abdomen and pelvis was performed following the standard protocol without IV contrast. RADIATION DOSE REDUCTION: This exam was performed according to the departmental dose-optimization program which includes automated exposure control, adjustment of the mA and/or kV according to patient size and/or use of iterative reconstruction technique. COMPARISON:  03/01/2022. FINDINGS: CT CHEST FINDINGS Cardiovascular: Right IJ Port-A-Cath terminates in the SVC. Atherosclerotic calcification of the aorta. Heart is at the upper limits of normal in size. No pericardial effusion. Mediastinum/Nodes: No pathologically enlarged mediastinal or axillary lymph nodes. Soft tissue thickening in the AP window, unchanged. Hilar regions are difficult to definitively evaluate without IV contrast. Esophagus is grossly unremarkable. Lungs/Pleura: Centrilobular emphysema. Scattered scarring in the right lung. Post radiation parenchymal retraction, architectural distortion and bronchiectasis in the medial left hemithorax. Moderate left pleural effusion with chronic  appearing volume loss in the adjacent left lower lobe, similar. Airway is unremarkable. Musculoskeletal: No worrisome lytic or sclerotic lesions. CT ABDOMEN PELVIS FINDINGS Hepatobiliary: Liver and gallbladder are unremarkable. No biliary ductal dilatation. Pancreas: Negative. Spleen: Negative. Adrenals/Urinary Tract: Adrenal glands and right kidney are unremarkable. 2.8 cm low-attenuation lesion in the upper pole left kidney, likely a cyst. No specific follow-up necessary. Ureters are decompressed. Bladder is grossly unremarkable. Stomach/Bowel: Stomach, small bowel, appendix and colon are unremarkable. Vascular/Lymphatic: Atherosclerotic calcification of the aorta. No pathologically enlarged lymph nodes. Reproductive: Prostate is visualized. Other: No free fluid.  Mesenteries and peritoneum are unremarkable. Musculoskeletal: No worrisome lytic or sclerotic lesions. IMPRESSION: 1. Post treatment scarring in the AP window and left hemithorax. No evidence recurrent or metastatic disease. 2. Moderate left pleural effusion with chronic appearing volume loss in the adjacent left lower lobe, stable. 3.  Aortic atherosclerosis (ICD10-I70.0). 4.  Emphysema (ICD10-J43.9). Electronically Signed   By: Lorin Picket M.D.   On: 06/25/2022 11:04   CT Abdomen Pelvis Wo Contrast  Result Date: 06/25/2022 CLINICAL DATA:  Non-small cell lung cancer, metastatic, assess treatment response. Shortness of breath. * Tracking Code: BO * EXAM: CT CHEST, ABDOMEN AND PELVIS WITHOUT CONTRAST TECHNIQUE: Multidetector CT imaging of the chest, abdomen and pelvis was performed following the standard protocol without IV contrast. RADIATION DOSE REDUCTION: This exam was performed according to the departmental dose-optimization program which includes automated exposure control, adjustment of the mA and/or kV  according to patient size and/or use of iterative reconstruction technique. COMPARISON:  03/01/2022. FINDINGS: CT CHEST FINDINGS  Cardiovascular: Right IJ Port-A-Cath terminates in the SVC. Atherosclerotic calcification of the aorta. Heart is at the upper limits of normal in size. No pericardial effusion. Mediastinum/Nodes: No pathologically enlarged mediastinal or axillary lymph nodes. Soft tissue thickening in the AP window, unchanged. Hilar regions are difficult to definitively evaluate without IV contrast. Esophagus is grossly unremarkable. Lungs/Pleura: Centrilobular emphysema. Scattered scarring in the right lung. Post radiation parenchymal retraction, architectural distortion and bronchiectasis in the medial left hemithorax. Moderate left pleural effusion with chronic appearing volume loss in the adjacent left lower lobe, similar. Airway is unremarkable. Musculoskeletal: No worrisome lytic or sclerotic lesions. CT ABDOMEN PELVIS FINDINGS Hepatobiliary: Liver and gallbladder are unremarkable. No biliary ductal dilatation. Pancreas: Negative. Spleen: Negative. Adrenals/Urinary Tract: Adrenal glands and right kidney are unremarkable. 2.8 cm low-attenuation lesion in the upper pole left kidney, likely a cyst. No specific follow-up necessary. Ureters are decompressed. Bladder is grossly unremarkable. Stomach/Bowel: Stomach, small bowel, appendix and colon are unremarkable. Vascular/Lymphatic: Atherosclerotic calcification of the aorta. No pathologically enlarged lymph nodes. Reproductive: Prostate is visualized. Other: No free fluid.  Mesenteries and peritoneum are unremarkable. Musculoskeletal: No worrisome lytic or sclerotic lesions. IMPRESSION: 1. Post treatment scarring in the AP window and left hemithorax. No evidence recurrent or metastatic disease. 2. Moderate left pleural effusion with chronic appearing volume loss in the adjacent left lower lobe, stable. 3.  Aortic atherosclerosis (ICD10-I70.0). 4.  Emphysema (ICD10-J43.9). Electronically Signed   By: Lorin Picket M.D.   On: 06/25/2022 11:04     ASSESSMENT/PLAN:  This is a  very pleasant 46 year old Caucasian male diagnosed with stage IV (T2b, N2, M1 B) non-small cell lung cancer, adenocarcinoma.  He presented with a large central left lower lobe lung mass in addition to left hilar mediastinal lymphadenopathy.  He also has retroperitoneal abdominal lymphadenopathy.  He was diagnosed in May 2022.  His PD-L1 expression is 50%.  His molecular studies by [facility] did not show any evidence of any actionable mutations.  He underwent palliative radiotherapy to the large left lower lobe lung mass under the care of Dr. Lisbeth Renshaw.  He completed this on 06/12/2021.    The patient is currently undergoing systemic chemotherapy with carboplatin for an AUC of 5, Alimta 500 mg per metered squared, and Keytruda 200 mg IV every 3 weeks.  He is status post 17 cycles.  Starting from cycle #5, the patient has been on maintenance Alimta and Keytruda.  The dose of Alimta was reduced to 400 mg per metered squared starting from cycle #12 due to fatigue.  Alimta was removed from the treatment plan starting from cycle #13 due to renal insufficiency.  Labs were reviewed.  Recommend that he proceed with cycle #18 today scheduled. Ok to treat with creatinine of ***  We will see him back for follow-up visit in 3 weeks for evaluation and repeat blood work before starting cycle #21.  He will continue to follow with nephrology regarding his renal insufficiency. They have given him a list of nephrotoxic agents to avoid.    The patient was advised to call immediately if he has any concerning symptoms in the interval. The patient voices understanding of current disease status and treatment options and is in agreement with the current care plan. All questions were answered. The patient knows to call the clinic with any problems, questions or concerns. We can certainly see the patient much sooner if necessary  No orders of the defined types were placed in this encounter.    I spent {CHL ONC TIME VISIT -  DTHYH:[id number]} counseling the patient face to face. The total time spent in the appointment was {CHL ONC TIME VISIT - ASUOR:[id number]}.  Rashun Grattan L Jayvien Rowlette, PA-C 07/13/22

## 2022-07-19 ENCOUNTER — Inpatient Hospital Stay: Payer: 59 | Attending: Internal Medicine

## 2022-07-19 ENCOUNTER — Other Ambulatory Visit: Payer: Self-pay

## 2022-07-19 ENCOUNTER — Inpatient Hospital Stay: Payer: 59

## 2022-07-19 ENCOUNTER — Inpatient Hospital Stay (HOSPITAL_BASED_OUTPATIENT_CLINIC_OR_DEPARTMENT_OTHER): Payer: 59 | Admitting: Physician Assistant

## 2022-07-19 VITALS — BP 110/79 | HR 69 | Temp 98.0°F | Resp 16 | Ht 69.0 in | Wt 178.7 lb

## 2022-07-19 DIAGNOSIS — Z79899 Other long term (current) drug therapy: Secondary | ICD-10-CM | POA: Diagnosis not present

## 2022-07-19 DIAGNOSIS — Z5112 Encounter for antineoplastic immunotherapy: Secondary | ICD-10-CM | POA: Insufficient documentation

## 2022-07-19 DIAGNOSIS — C3432 Malignant neoplasm of lower lobe, left bronchus or lung: Secondary | ICD-10-CM | POA: Diagnosis present

## 2022-07-19 DIAGNOSIS — Z95828 Presence of other vascular implants and grafts: Secondary | ICD-10-CM

## 2022-07-19 LAB — CMP (CANCER CENTER ONLY)
ALT: 9 U/L (ref 0–44)
AST: 10 U/L — ABNORMAL LOW (ref 15–41)
Albumin: 3.7 g/dL (ref 3.5–5.0)
Alkaline Phosphatase: 98 U/L (ref 38–126)
Anion gap: 4 — ABNORMAL LOW (ref 5–15)
BUN: 19 mg/dL (ref 6–20)
CO2: 27 mmol/L (ref 22–32)
Calcium: 9.3 mg/dL (ref 8.9–10.3)
Chloride: 107 mmol/L (ref 98–111)
Creatinine: 1.46 mg/dL — ABNORMAL HIGH (ref 0.61–1.24)
GFR, Estimated: 59 mL/min — ABNORMAL LOW (ref >60)
Glucose, Bld: 86 mg/dL (ref 70–99)
Potassium: 4.1 mmol/L (ref 3.5–5.1)
Sodium: 138 mmol/L (ref 135–145)
Total Bilirubin: 0.3 mg/dL (ref 0.3–1.2)
Total Protein: 7.5 g/dL (ref 6.5–8.1)

## 2022-07-19 LAB — CBC WITH DIFFERENTIAL (CANCER CENTER ONLY)
Abs Immature Granulocytes: 0.02 10*3/uL (ref 0.00–0.07)
Basophils Absolute: 0.1 10*3/uL (ref 0.0–0.1)
Basophils Relative: 1 %
Eosinophils Absolute: 0.2 10*3/uL (ref 0.0–0.5)
Eosinophils Relative: 2 %
HCT: 30.9 % — ABNORMAL LOW (ref 39.0–52.0)
Hemoglobin: 10 g/dL — ABNORMAL LOW (ref 13.0–17.0)
Immature Granulocytes: 0 %
Lymphocytes Relative: 13 %
Lymphs Abs: 0.9 10*3/uL (ref 0.7–4.0)
MCH: 29.2 pg (ref 26.0–34.0)
MCHC: 32.4 g/dL (ref 30.0–36.0)
MCV: 90.1 fL (ref 80.0–100.0)
Monocytes Absolute: 0.8 10*3/uL (ref 0.1–1.0)
Monocytes Relative: 11 %
Neutro Abs: 5.2 10*3/uL (ref 1.7–7.7)
Neutrophils Relative %: 73 %
Platelet Count: 310 10*3/uL (ref 150–400)
RBC: 3.43 MIL/uL — ABNORMAL LOW (ref 4.22–5.81)
RDW: 13.7 % (ref 11.5–15.5)
WBC Count: 7.2 10*3/uL (ref 4.0–10.5)
nRBC: 0 % (ref 0.0–0.2)

## 2022-07-19 LAB — TSH: TSH: 1.766 u[IU]/mL (ref 0.350–4.500)

## 2022-07-19 MED ORDER — HEPARIN SOD (PORK) LOCK FLUSH 100 UNIT/ML IV SOLN
500.0000 [IU] | Freq: Once | INTRAVENOUS | Status: AC | PRN
  Administered 2022-07-19: 500 [IU]

## 2022-07-19 MED ORDER — SODIUM CHLORIDE 0.9 % IV SOLN
200.0000 mg | Freq: Once | INTRAVENOUS | Status: AC
  Administered 2022-07-19: 200 mg via INTRAVENOUS
  Filled 2022-07-19: qty 200

## 2022-07-19 MED ORDER — PROCHLORPERAZINE MALEATE 10 MG PO TABS: 10.0000 mg | ORAL_TABLET | Freq: Once | ORAL | Status: DC

## 2022-07-19 MED ORDER — SODIUM CHLORIDE 0.9 % IV SOLN: Freq: Once | INTRAVENOUS | Status: AC

## 2022-07-19 MED ORDER — SODIUM CHLORIDE 0.9% FLUSH
10.0000 mL | Freq: Once | INTRAVENOUS | Status: AC
  Administered 2022-07-19: 10 mL

## 2022-07-19 MED ORDER — SODIUM CHLORIDE 0.9% FLUSH
10.0000 mL | INTRAVENOUS | Status: DC | PRN
  Administered 2022-07-19: 10 mL

## 2022-07-19 NOTE — Patient Instructions (Signed)
[facility] ONCOLOG[facility]  Discharge Instructions: Thank you for choosing [facility] to provide your oncology and hematology care.   If you have a lab appointment with the [facility], please go directly to the [facility] and check in at the registration area.   Wear comfortable clothing and clothing appropriate for easy access to any Portacath or PICC line.   We strive to give you quality time with your provider. You may need to reschedule your appointment if you arrive late (15 or more minutes).  Arriving late affects you and other patients whose appointments are after yours.  Also, if you miss three or more appointments without notifying the office, you may be dismissed from the clinic at the provider's discretion.      For prescription refill requests, have your pharmacy contact our office and allow 72 hours for refills to be completed.    Today you received the following chemotherapy and/or immunotherapy agents : Keytruda      To help prevent nausea and vomiting after your treatment, we encourage you to take your nausea medication as directed.  BELOW ARE SYMPTOMS THAT SHOULD BE REPORTED IMMEDIATELY: *FEVER GREATER THAN 100.4 F (38 C) OR HIGHER *CHILLS OR SWEATING *NAUSEA AND VOMITING THAT IS NOT CONTROLLED WITH YOUR NAUSEA MEDICATION *UNUSUAL SHORTNESS OF BREATH *UNUSUAL BRUISING OR BLEEDING *URINARY PROBLEMS (pain or burning when urinating, or frequent urination) *BOWEL PROBLEMS (unusual diarrhea, constipation, pain near the anus) TENDERNESS IN MOUTH AND THROAT WITH OR WITHOUT PRESENCE OF ULCERS (sore throat, sores in mouth, or a toothache) UNUSUAL RASH, SWELLING OR PAIN  UNUSUAL VAGINAL DISCHARGE OR ITCHING   Items with * indicate a potential emergency and should be followed up as soon as possible or go to the Emergency Department if any problems should occur.  Please show the CHEMOTHERAPY ALERT CARD or IMMUNOTHERAPY ALERT CARD at check-in to  the Emergency Department and triage nurse.  Should you have questions after your visit or need to cancel or reschedule your appointment, please contact [facility]  Dept: [phone]  and follow the prompts.  Office hours are 8:00 a.m. to 4:30 p.m. Monday - Friday. Please note that voicemails left after 4:00 p.m. may not be returned until the following business day.  We are closed weekends and major holidays. You have access to a nurse at all times for urgent questions. Please call the main number to the clinic Dept: [phone] and follow the prompts.   For any non-urgent questions, you may also contact your provider using MyChart. We now offer e-Visits for anyone 32 and older to request care online for non-urgent symptoms. For details visit [email or web address].   Also download the MyChart app! Go to the app store, search "MyChart", open the app, select [facility], and log in with your MyChart username and password.  Masks are optional in the cancer centers. If you would like for your care team to wear a mask while they are taking care of you, please let them know. You may have one support person who is at least 48 years old accompany you for your appointments.

## 2022-07-23 ENCOUNTER — Other Ambulatory Visit (HOSPITAL_COMMUNITY): Payer: Self-pay

## 2022-08-04 ENCOUNTER — Other Ambulatory Visit: Payer: Self-pay

## 2022-08-08 IMAGING — CT CT CHEST W/ CM
2 of 5 series · 12 of 36 positions shown, 15 images · IV contrast (OMNIPAQUE)
Comparison: Prior CTs 08/15/2021 and PET-CT 05/02/2021.

CLINICAL DATA: Restaging non-small cell lung cancer. Cough with
weight loss. No abdominal complaints.

EXAM:
CT CHEST, ABDOMEN, AND PELVIS WITH CONTRAST
TECHNIQUE: Multidetector CT imaging of the chest, abdomen and pelvis was
performed following the standard protocol during bolus
administration of intravenous contrast.
CONTRAST:  80mL OMNIPAQUE IOHEXOL 350 MG/ML SOLN

[Series 2: cap with · axial · 0.84mm/px · z∈[-459,+61]mm · 9 of 131 slices shown, 12 images]
[im 14/131  mediastinal]
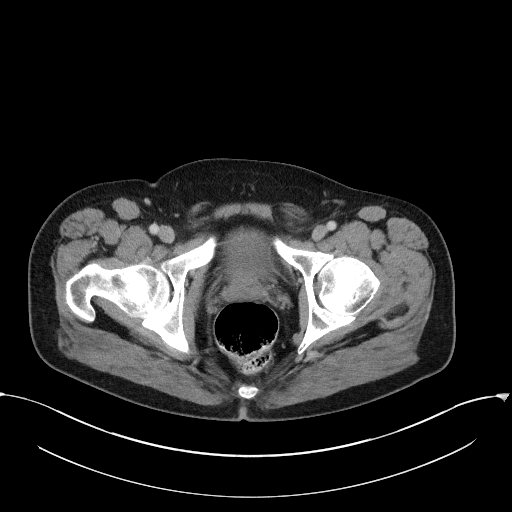
[im 14/131  lung]
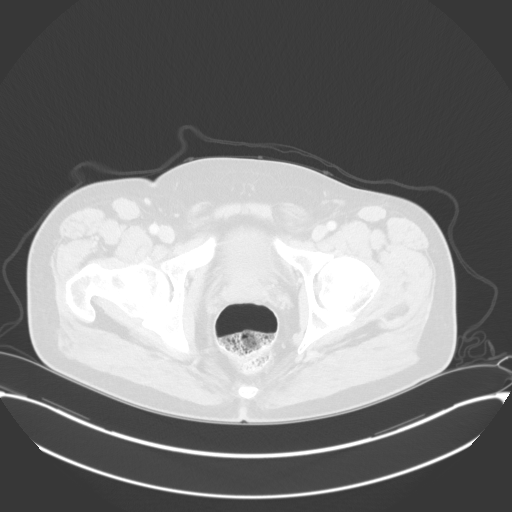
[im 27/131  lung]
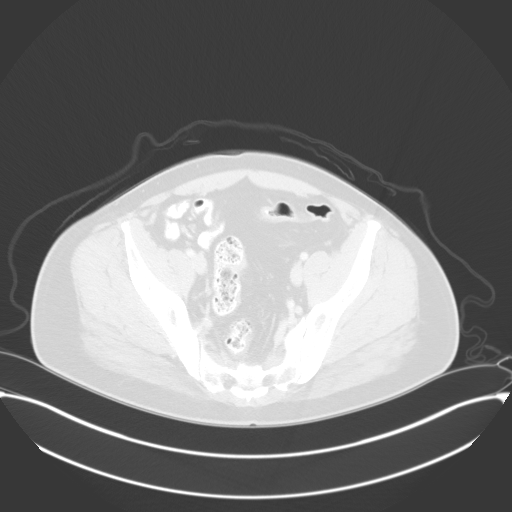
[im 40/131  lung]
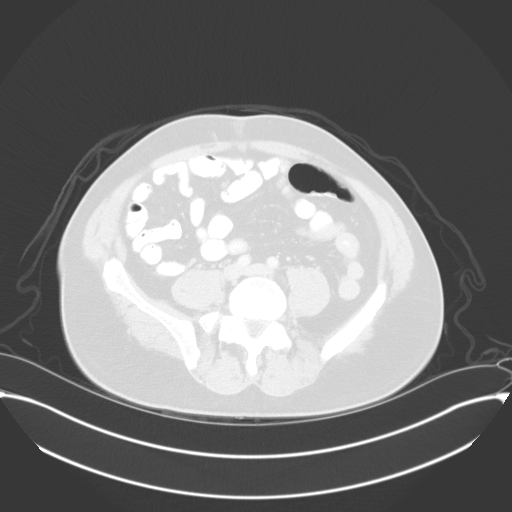
[im 53/131  lung]
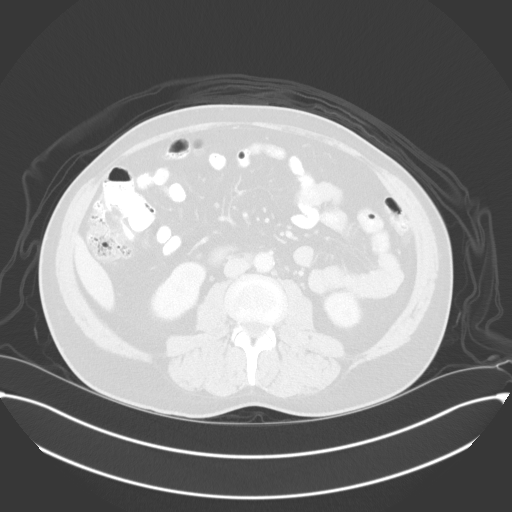
[im 66/131  mediastinal]
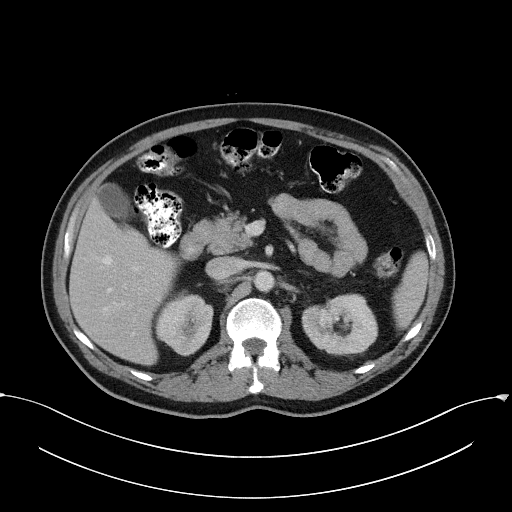
[im 66/131  lung]
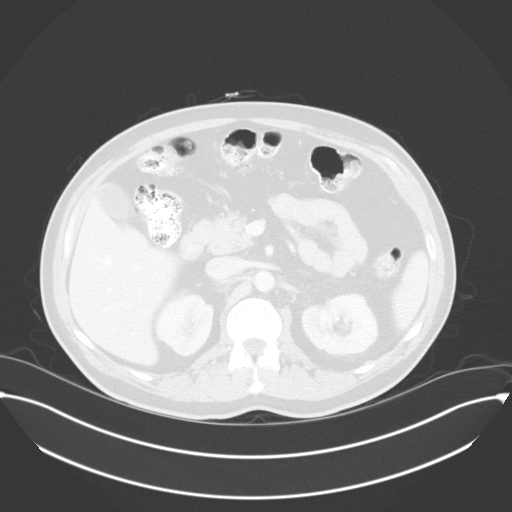
[im 79/131  lung]
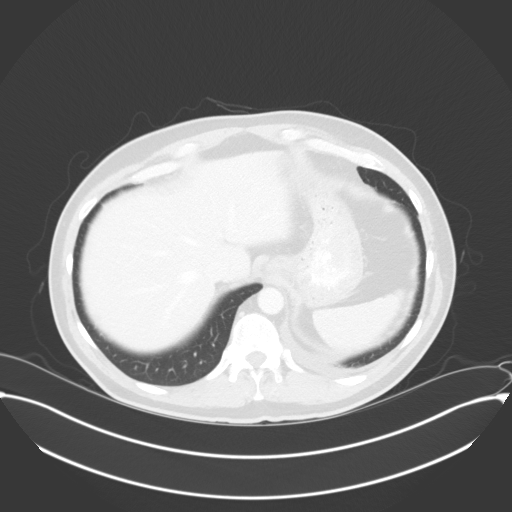
[im 92/131  lung]
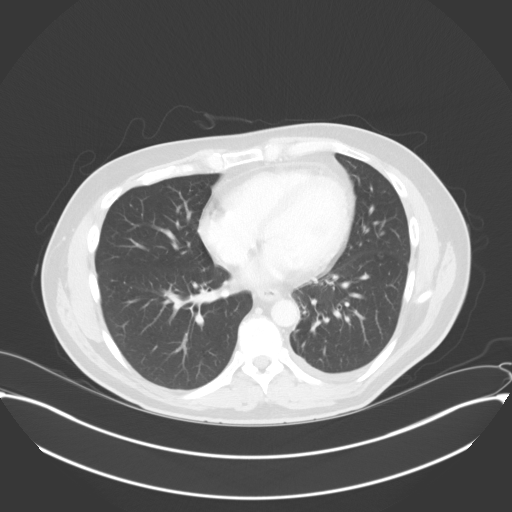
[im 105/131  lung]
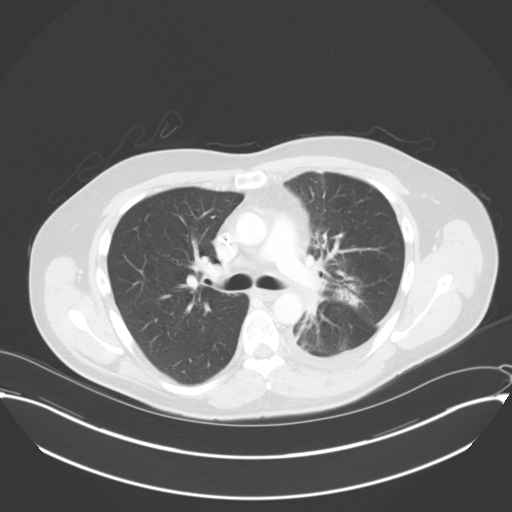
[im 118/131  mediastinal]
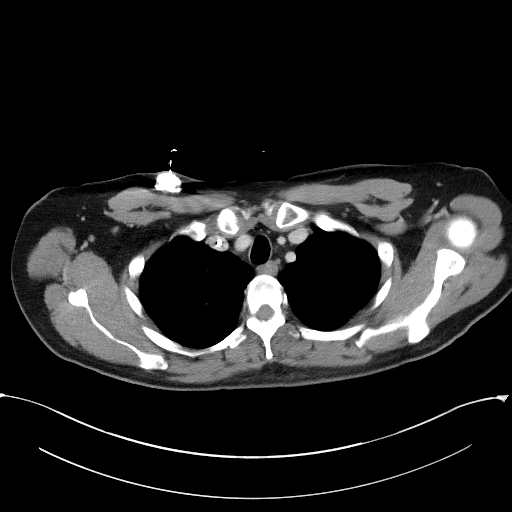
[im 118/131  lung]
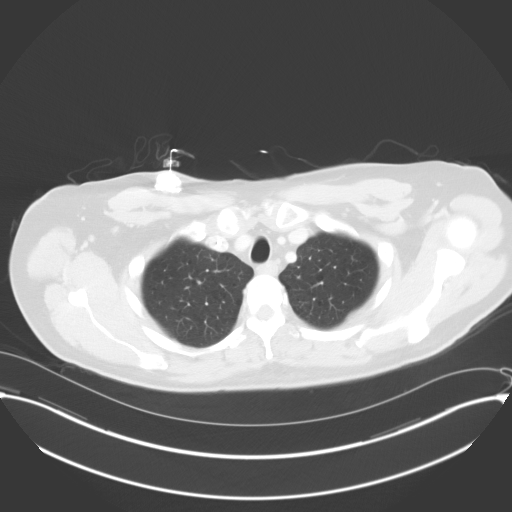

[Series 5: coronals · coronal · 0.83mm/px · 3 of 137 slices shown]
[im 28/137  lung]
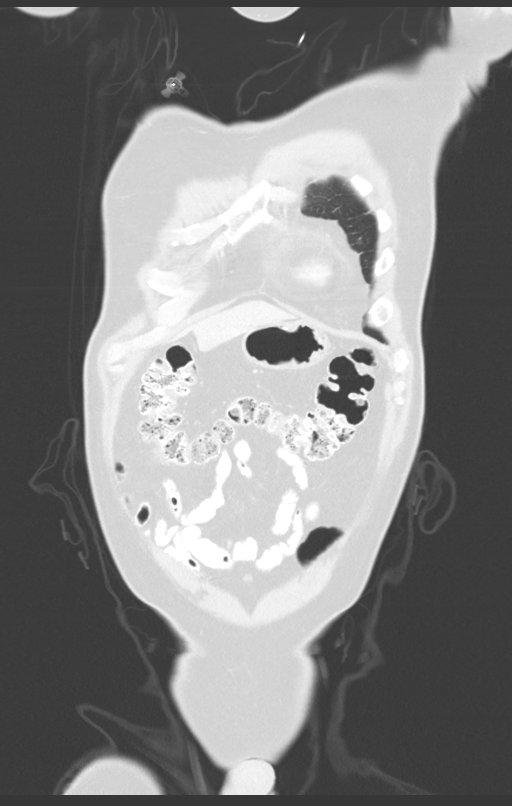
[im 55/137  lung]
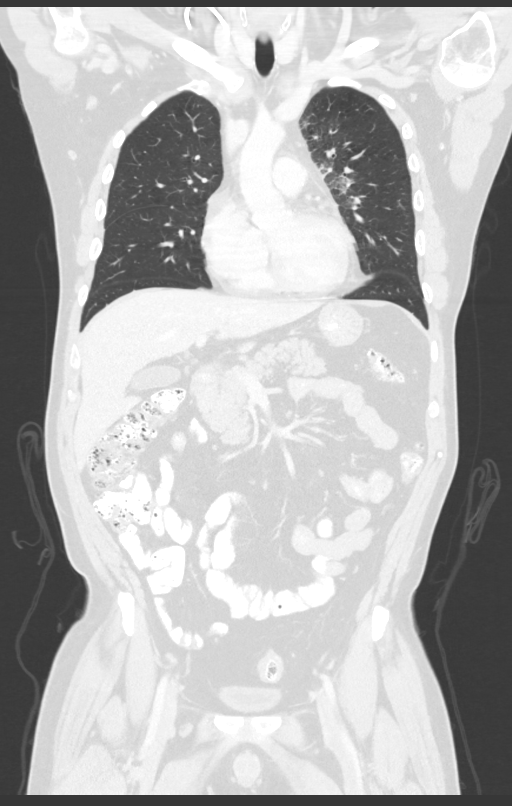
[im 82/137  lung]
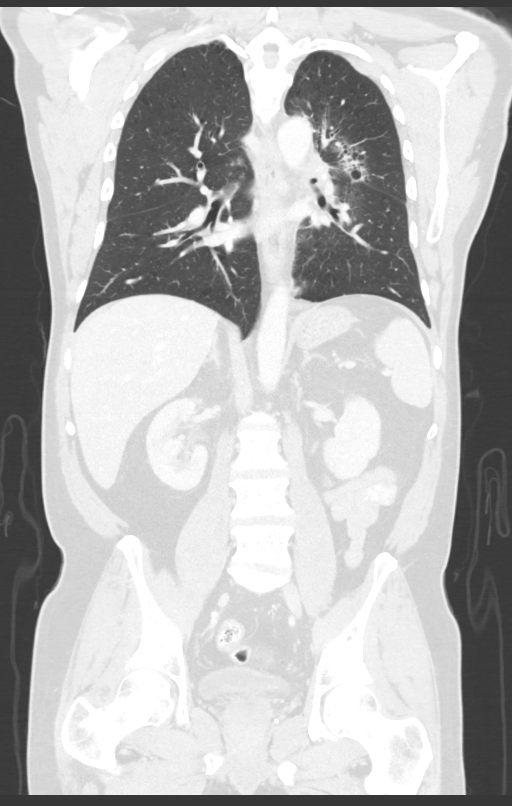

[12 of 36 positions shown; findings below may reference images not displayed]

FINDINGS: CT CHEST FINDINGS

Cardiovascular: No acute vascular findings are seen. There is mild
aortic atherosclerosis. Right IJ Port-A-Cath extends to the mid SVC.
The heart size is normal. A small amount of fluid within the
superior pericardial recess appears unchanged.

Mediastinum/Nodes: Further improvement in previously demonstrated
lymphadenopathy. AP window node measures approximately 11 mm short
axis (previously 12 mm). Posterior left hilar mass measures 1.9 x
1.7 cm on image [DATE] (previously 2.4 x 2.1 cm). No progressive
mediastinal, hilar or axillary adenopathy. The thyroid gland,
trachea and esophagus demonstrate no significant findings.

Lungs/Pleura: Minimal enlargement of a small left pleural effusion
without pleural based nodularity. Mild centrilobular and paraseptal
emphysema. As above, interval improvement in the left retrohilar
mass. No new or enlarging pulmonary nodules. There are perihilar
opacities in the left lung with a relatively sharp lateral margin,
most likely due to radiation pneumonitis/fibrosis.

Musculoskeletal/Chest wall: No chest wall mass or suspicious osseous
findings.

CT ABDOMEN AND PELVIS FINDINGS

Hepatobiliary: The liver is normal in density without suspicious
focal abnormality. No evidence of gallstones, gallbladder wall
thickening or biliary dilatation.

Pancreas: Unremarkable. No pancreatic ductal dilatation or
surrounding inflammatory changes.

Spleen: Normal in size without focal abnormality.

Adrenals/Urinary Tract: Both adrenal glands appear normal. Stable
cyst in the upper pole of the left kidney. No evidence of enhancing
renal mass, urinary tract calculus or hydronephrosis. The bladder
appears unremarkable for its degree of distention.

Stomach/Bowel: Enteric contrast was administered and has passed into
the distal colon. The stomach appears unremarkable for its degree of
distension. No evidence of bowel wall thickening, distention or
surrounding inflammatory change. The appendix appears normal. Stable
mild diverticular changes within the sigmoid colon.

Vascular/Lymphatic: There are no enlarged abdominal or pelvic lymph
nodes. Previously demonstrated retroperitoneal lymphadenopathy has
resolved. No significant vascular findings. There is minimal
aortoiliac atherosclerosis without aneurysm. The portal, superior
mesenteric and splenic veins are patent.

Reproductive: The prostate gland and seminal vesicles appear
unremarkable.

Other: No ascites or peritoneal nodularity.  Intact abdominal wall.

Musculoskeletal: No acute or significant osseous findings.
IMPRESSION: 1. Further decrease in size of the mass posterior to the left hilum,
consistent with response to therapy. No recurrent mediastinal or
upper abdominal lymphadenopathy.
2. Evolving radiation changes in the left perihilar region. Left
pleural effusion has minimally enlarged, without nodularity.
3. Mild distal colonic diverticulosis.
4. Aortic Atherosclerosis (Z64CW-IZJ.J) and Emphysema ([id number]-[id number].2).

## 2022-08-09 ENCOUNTER — Inpatient Hospital Stay: Payer: 59

## 2022-08-09 ENCOUNTER — Inpatient Hospital Stay: Payer: 59 | Attending: Physician Assistant

## 2022-08-09 ENCOUNTER — Other Ambulatory Visit: Payer: Self-pay

## 2022-08-09 ENCOUNTER — Inpatient Hospital Stay (HOSPITAL_BASED_OUTPATIENT_CLINIC_OR_DEPARTMENT_OTHER): Payer: 59 | Admitting: Internal Medicine

## 2022-08-09 VITALS — BP 118/69 | HR 74 | Temp 97.7°F | Resp 16

## 2022-08-09 VITALS — BP 135/78 | HR 74 | Temp 97.7°F | Resp 15 | Wt 181.1 lb

## 2022-08-09 DIAGNOSIS — N289 Disorder of kidney and ureter, unspecified: Secondary | ICD-10-CM | POA: Diagnosis not present

## 2022-08-09 DIAGNOSIS — C3432 Malignant neoplasm of lower lobe, left bronchus or lung: Secondary | ICD-10-CM

## 2022-08-09 DIAGNOSIS — Z95828 Presence of other vascular implants and grafts: Secondary | ICD-10-CM

## 2022-08-09 DIAGNOSIS — Z5112 Encounter for antineoplastic immunotherapy: Secondary | ICD-10-CM | POA: Diagnosis present

## 2022-08-09 DIAGNOSIS — C3490 Malignant neoplasm of unspecified part of unspecified bronchus or lung: Secondary | ICD-10-CM

## 2022-08-09 LAB — CBC WITH DIFFERENTIAL (CANCER CENTER ONLY)
Abs Immature Granulocytes: 0.04 10*3/uL (ref 0.00–0.07)
Basophils Absolute: 0.1 10*3/uL (ref 0.0–0.1)
Basophils Relative: 1 %
Eosinophils Absolute: 0.2 10*3/uL (ref 0.0–0.5)
Eosinophils Relative: 3 %
HCT: 32.1 % — ABNORMAL LOW (ref 39.0–52.0)
Hemoglobin: 10.5 g/dL — ABNORMAL LOW (ref 13.0–17.0)
Immature Granulocytes: 1 %
Lymphocytes Relative: 15 %
Lymphs Abs: 1.1 10*3/uL (ref 0.7–4.0)
MCH: 28.8 pg (ref 26.0–34.0)
MCHC: 32.7 g/dL (ref 30.0–36.0)
MCV: 87.9 fL (ref 80.0–100.0)
Monocytes Absolute: 0.8 10*3/uL (ref 0.1–1.0)
Monocytes Relative: 11 %
Neutro Abs: 5.3 10*3/uL (ref 1.7–7.7)
Neutrophils Relative %: 69 %
Platelet Count: 306 10*3/uL (ref 150–400)
RBC: 3.65 MIL/uL — ABNORMAL LOW (ref 4.22–5.81)
RDW: 14.2 % (ref 11.5–15.5)
WBC Count: 7.5 10*3/uL (ref 4.0–10.5)
nRBC: 0 % (ref 0.0–0.2)

## 2022-08-09 LAB — CMP (CANCER CENTER ONLY)
ALT: 10 U/L (ref 0–44)
AST: 10 U/L — ABNORMAL LOW (ref 15–41)
Albumin: 3.7 g/dL (ref 3.5–5.0)
Alkaline Phosphatase: 106 U/L (ref 38–126)
Anion gap: 5 (ref 5–15)
BUN: 20 mg/dL (ref 6–20)
CO2: 26 mmol/L (ref 22–32)
Calcium: 9.1 mg/dL (ref 8.9–10.3)
Chloride: 108 mmol/L (ref 98–111)
Creatinine: 1.51 mg/dL — ABNORMAL HIGH (ref 0.61–1.24)
GFR, Estimated: 57 mL/min — ABNORMAL LOW (ref >60)
Glucose, Bld: 93 mg/dL (ref 70–99)
Potassium: 4.2 mmol/L (ref 3.5–5.1)
Sodium: 139 mmol/L (ref 135–145)
Total Bilirubin: 0.2 mg/dL — ABNORMAL LOW (ref 0.3–1.2)
Total Protein: 7.4 g/dL (ref 6.5–8.1)

## 2022-08-09 LAB — TSH: TSH: 1.856 u[IU]/mL (ref 0.350–4.500)

## 2022-08-09 MED ORDER — HEPARIN SOD (PORK) LOCK FLUSH 100 UNIT/ML IV SOLN
500.0000 [IU] | Freq: Once | INTRAVENOUS | Status: AC | PRN
  Administered 2022-08-09: 500 [IU]

## 2022-08-09 MED ORDER — SODIUM CHLORIDE 0.9 % IV SOLN: Freq: Once | INTRAVENOUS | Status: AC

## 2022-08-09 MED ORDER — SODIUM CHLORIDE 0.9% FLUSH
10.0000 mL | INTRAVENOUS | Status: DC | PRN
  Administered 2022-08-09: 10 mL

## 2022-08-09 MED ORDER — SODIUM CHLORIDE 0.9% FLUSH
10.0000 mL | Freq: Once | INTRAVENOUS | Status: AC
  Administered 2022-08-09: 10 mL

## 2022-08-09 MED ORDER — SODIUM CHLORIDE 0.9 % IV SOLN
200.0000 mg | Freq: Once | INTRAVENOUS | Status: AC
  Administered 2022-08-09: 200 mg via INTRAVENOUS
  Filled 2022-08-09: qty 200

## 2022-08-09 NOTE — Progress Notes (Signed)
[facility] [phone]   [phone]  OFFICE PROGRESS NOTE  [name], MD [place] [address]  DIAGNOSIS:  Stage IV (T2b, N2, M1 B) non-small cell lung cancer, adenocarcinoma presented with large central left lower lobe lung mass with left hilar and mediastinal lymphadenopathy as well as abdominal retroperitoneal lymphadenopathy diagnosed in May 2022.  The patient also has bilateral parotid gland nodules that need close monitoring.  [facility] MOLECULAR STUDY: Results with Therapy Associations BIOMARKER METHOD ANALYTE RESULT THERAPY ASSOCIATION BIOMARKER LEVEL* .PD-L1 (22c3) IHC Protein Positive, TPS: 50% BENEFIT cemiplimab, pembrolizumab Level 1 .PD-L1 (28-8) IHC Protein Positive  1+, 50% BENEFIT nivolumab/ipilimumab combination Level 1 .TMB Seq DNA-Tumor High, 18 mut/Mb BENEFIT pembrolizumab Level 2 . alectinib, ceritinib, crizotinib, lorlatinib Level 1 . IHC Protein Negative  0 brigatinib Level 2 . ALK Seq RNA-Tumor Fusion Not Detected LACK OF BENEFIT alectinib, brigatinib, ceritinib, crizotinib, lorlatinib Level 2 .BRAF Seq DNA-Tumor Mutation Not Detected LACK OF BENEFIT dabrafenib and trametinib combination therapy, vemurafenib Level 2 .EGFR Seq DNA-Tumor Mutation Not Detected LACK OF BENEFIT erlotinib, gefitinib Level 2 .KRAS Seq DNA-Tumor Mutation Not Detected LACK OF BENEFIT sotorasib Level 2 .RET Seq RNA-Tumor Fusion Not Detected LACK OF BENEFIT pralsetinib, selpercatinib Level 2 .ROS1 Seq RNA-Tumor Fusion Not Detected LACK OF BENEFIT ceritinib, crizotinib, entrectinib, lorlatinib Level 2 . CNA-Seq DNA-Tumor Amplification Not Detected .MET Seq RNA-Tumor Variant Transcript Not Detected LACK OF BENEFIT crizotinib Level 3  PRIOR THERAPY: Palliative radiotherapy to the large central left lower lobe lung mass under the care of Dr. Lisbeth Renshaw.  CURRENT THERAPY: Systemic chemotherapy with carboplatin for AUC  of 5, Alimta 500 Mg/M2 and possibly Keytruda 200 Mg IV every 3 weeks.  First dose expected June 14, 2021.  Starting from cycle #5 the patient is on maintenance treatment with Alimta and Keytruda every 3 weeks.  Status post 20 cycles of treatment.  Starting from cycle #12 the dose of Alimta to 400 Mg/M2 because of his increasing fatigue and side effects.  Alimta was discontinued secondary to renal insufficiency.  INTERVAL HISTORY: Cameron Harper 46 y.o. male returns to the clinic today for follow-up visit.  The patient is feeling fine today with no concerning complaints.  He denied having any chest pain, shortness of breath, cough or hemoptysis.  He has no nausea, vomiting, diarrhea or constipation.  He has no headache or visual changes.  He has no weight loss or night sweats.  He continues to tolerate his maintenance treatment with Keytruda fairly well.  The patient is here today for evaluation before starting cycle #21.   MEDICAL HISTORY: Past Medical History:  Diagnosis Date   Chronic back pain    Eczema    GERD (gastroesophageal reflux disease)    Lung cancer (HCC)     ALLERGIES:  is allergic to penicillins and zofran [ondansetron].  MEDICATIONS:  Current Outpatient Medications  Medication Sig Dispense Refill   acetaminophen (TYLENOL) 500 MG tablet Take 500-1,000 mg by mouth every 6 (six) hours as needed (for back pain.). (Patient not taking: Reported on 2/44/9753)     folic acid (FOLVITE) 1 MG tablet Take 1 tablet (1 mg total) by mouth daily. 30 tablet 4   lidocaine-prilocaine (EMLA) cream Apply 1 application topically as needed. 30 g 2   prochlorperazine (COMPAZINE) 10 MG tablet Take 1 tablet (10 mg total) by mouth every 6 (six) hours as needed for nausea or vomiting. (Patient not taking: Reported on 05/17/2022) 30 tablet  0   No current facility-administered medications for this visit.    SURGICAL HISTORY:  Past Surgical History:  Procedure Laterality Date   BRONCHIAL BRUSHINGS   05/12/2021   Procedure: BRONCHIAL BRUSHINGS;  Surgeon: Garner Nash, DO;  Location: [facility];  Service: Pulmonary;;   BRONCHIAL DILITATION  05/12/2021   Procedure: BRONCHIAL DILITATION;  Surgeon: Garner Nash, DO;  Location: [facility];  Service: Pulmonary;;   BRONCHIAL NEEDLE ASPIRATION BIOPSY  05/12/2021   Procedure: BRONCHIAL NEEDLE ASPIRATION BIOPSIES;  Surgeon: Garner Nash, DO;  Location: [facility];  Service: Pulmonary;;   BRONCHIAL WASHINGS  05/12/2021   Procedure: BRONCHIAL WASHINGS;  Surgeon: Garner Nash, DO;  Location: [facility];  Service: Pulmonary;;   CRYOTHERAPY  05/12/2021   Procedure: CRYOTHERAPY;  Surgeon: Garner Nash, DO;  Location: [facility];  Service: Pulmonary;;   HEMOSTASIS CONTROL  05/12/2021   Procedure: HEMOSTASIS CONTROL;  Surgeon: Garner Nash, DO;  Location: [facility];  Service: Pulmonary;;  epi injection   IR IMAGING GUIDED PORT INSERTION  06/29/2021   Spine injection     Pain control   VIDEO BRONCHOSCOPY WITH ENDOBRONCHIAL ULTRASOUND N/A 05/12/2021   Procedure: VIDEO BRONCHOSCOPY WITH ENDOBRONCHIAL ULTRASOUND;  Surgeon: Garner Nash, DO;  Location: [facility];  Service: Pulmonary;  Laterality: N/A;   WISDOM TOOTH EXTRACTION     no anesthesia involed    REVIEW OF SYSTEMS:  A comprehensive review of systems was negative except for: Constitutional: positive for fatigue   PHYSICAL EXAMINATION: General appearance: alert, cooperative, fatigued, and no distress Head: Normocephalic, without obvious abnormality, atraumatic Neck: no adenopathy, no JVD, supple, symmetrical, trachea midline, and thyroid not enlarged, symmetric, no tenderness/mass/nodules Lymph nodes: Cervical, supraclavicular, and axillary nodes normal. Resp: clear to auscultation bilaterally Back: symmetric, no curvature. ROM normal. No CVA tenderness. Cardio: regular rate and rhythm, S1, S2 normal, no murmur, click, rub or gallop GI: soft, non-tender; bowel  sounds normal; no masses,  no organomegaly Extremities: extremities normal, atraumatic, no cyanosis or edema  ECOG PERFORMANCE STATUS: 1 - Symptomatic but completely ambulatory  Blood pressure 135/78, pulse 74, temperature 97.7 F (36.5 C), temperature source Oral, resp. rate 15, weight 181 lb 1.6 oz (82.1 kg), SpO2 100 %.  LABORATORY DATA: Lab Results  Component Value Date   WBC 7.5 08/09/2022   HGB 10.5 (L) 08/09/2022   HCT 32.1 (L) 08/09/2022   MCV 87.9 08/09/2022   PLT 306 08/09/2022      Chemistry      Component Value Date/Time   NA 138 07/19/2022 1052   K 4.1 07/19/2022 1052   CL 107 07/19/2022 1052   CO2 27 07/19/2022 1052   BUN 19 07/19/2022 1052   CREATININE 1.46 (H) 07/19/2022 1052      Component Value Date/Time   CALCIUM 9.3 07/19/2022 1052   ALKPHOS 98 07/19/2022 1052   AST 10 (L) 07/19/2022 1052   ALT 9 07/19/2022 1052   BILITOT 0.3 07/19/2022 1052       RADIOGRAPHIC STUDIES: No results found.  ASSESSMENT AND PLAN: This is a very pleasant 46 years old white male recently diagnosed with a stage IV (T2b, N2, M1 B) non-small cell lung cancer, adenocarcinoma presented with large central left lower lobe lung mass in addition to left hilar and mediastinal lymphadenopathy as well as retroperitoneal abdominal lymph node diagnosed in May 2022.  The patient is currently undergoing palliative radiotherapy to the large left lower lobe lung mass under the care of of Dr.  Moody.  He is expected to complete this course of treatment on June 12, 2021. MRI of the brain showed no evidence of metastatic disease to the brain. His molecular studies by [facility] showed no actionable mutations and PD-L1 expression of 50%. He is currently undergoing systemic chemotherapy with carboplatin for AUC of 5, Alimta 500 Mg/M2 and Keytruda 200 Mg IV every 3 weeks.  Starting from cycle #5 he is on maintenance treatment with Alimta at 400 Mg/M2 and Keytruda 200 Mg IV every 3 weeks status post 20  cycles.  He is currently on single agent Keytruda secondary to renal insufficiency. He has been tolerating this treatment well with no concerning adverse effects except for fatigue. I recommended for him to proceed with cycle #21 today as planned I will see him back for follow-up visit in 3 weeks for evaluation with repeat CT scan of the chest, abdomen and pelvis for restaging of his disease. For the renal insufficiency, he is currently followed by nephrology. For the anemia of chronic disease, he will continue with the oral iron supplements and we will continue to monitor it closely and consider him for transfusion if needed. The patient was advised to call immediately if he has any other concerning symptoms in the interval.  The patient voices understanding of current disease status and treatment options and is in agreement with the current care plan. All questions were answered. The patient knows to call the clinic with any problems, questions or concerns. We can certainly see the patient much sooner if necessary.  Disclaimer: This note was dictated with voice recognition software. Similar sounding words can inadvertently be transcribed and may not be corrected upon review.

## 2022-08-09 NOTE — Progress Notes (Signed)
Ok to treat today with Scr of 1.51 per Dr. Julien Nordmann.

## 2022-08-09 NOTE — Patient Instructions (Signed)
[facility]  Discharge Instructions: Thank you for choosing [facility] to provide your oncology and hematology care.   If you have a lab appointment with the [facility], please go directly to the [facility] and check in at the registration area.   Wear comfortable clothing and clothing appropriate for easy access to any Portacath or PICC line.   We strive to give you quality time with your provider. You may need to reschedule your appointment if you arrive late (15 or more minutes).  Arriving late affects you and other patients whose appointments are after yours.  Also, if you miss three or more appointments without notifying the office, you may be dismissed from the clinic at the provider's discretion.      For prescription refill requests, have your pharmacy contact our office and allow 72 hours for refills to be completed.    Today you received the following chemotherapy and/or immunotherapy agents : Keytruda      To help prevent nausea and vomiting after your treatment, we encourage you to take your nausea medication as directed.  BELOW ARE SYMPTOMS THAT SHOULD BE REPORTED IMMEDIATELY: *FEVER GREATER THAN 100.4 F (38 C) OR HIGHER *CHILLS OR SWEATING *NAUSEA AND VOMITING THAT IS NOT CONTROLLED WITH YOUR NAUSEA MEDICATION *UNUSUAL SHORTNESS OF BREATH *UNUSUAL BRUISING OR BLEEDING *URINARY PROBLEMS (pain or burning when urinating, or frequent urination) *BOWEL PROBLEMS (unusual diarrhea, constipation, pain near the anus) TENDERNESS IN MOUTH AND THROAT WITH OR WITHOUT PRESENCE OF ULCERS (sore throat, sores in mouth, or a toothache) UNUSUAL RASH, SWELLING OR PAIN  UNUSUAL VAGINAL DISCHARGE OR ITCHING   Items with * indicate a potential emergency and should be followed up as soon as possible or go to the Emergency Department if any problems should occur.  Please show the CHEMOTHERAPY ALERT CARD or IMMUNOTHERAPY ALERT CARD at check-in to  the Emergency Department and triage nurse.  Should you have questions after your visit or need to cancel or reschedule your appointment, please contact [facility]  Dept: [phone]  and follow the prompts.  Office hours are 8:00 a.m. to 4:30 p.m. Monday - Friday. Please note that voicemails left after 4:00 p.m. may not be returned until the following business day.  We are closed weekends and major holidays. You have access to a nurse at all times for urgent questions. Please call the main number to the clinic Dept: [phone] and follow the prompts.   For any non-urgent questions, you may also contact your provider using MyChart. We now offer e-Visits for anyone 72 and older to request care online for non-urgent symptoms. For details visit [email or web address].   Also download the MyChart app! Go to the app store, search "MyChart", open the app, select [facility], and log in with your MyChart username and password.  Masks are optional in the cancer centers. If you would like for your care team to wear a mask while they are taking care of you, please let them know. You may have one support person who is at least 48 years old accompany you for your appointments.

## 2022-08-10 ENCOUNTER — Other Ambulatory Visit: Payer: Self-pay

## 2022-08-10 LAB — T4: T4, Total: 7.3 ug/dL (ref 4.5–12.0)

## 2022-08-11 ENCOUNTER — Other Ambulatory Visit: Payer: Self-pay

## 2022-08-12 ENCOUNTER — Other Ambulatory Visit: Payer: Self-pay

## 2022-08-22 ENCOUNTER — Telehealth: Payer: Self-pay | Admitting: Internal Medicine

## 2022-08-22 ENCOUNTER — Other Ambulatory Visit (HOSPITAL_COMMUNITY): Payer: Self-pay

## 2022-08-22 NOTE — Telephone Encounter (Signed)
Called patient regarding upcoming all appointments, patient has been notified.

## 2022-08-27 ENCOUNTER — Other Ambulatory Visit (HOSPITAL_COMMUNITY): Payer: Self-pay

## 2022-08-28 ENCOUNTER — Ambulatory Visit (HOSPITAL_COMMUNITY)
Admission: RE | Admit: 2022-08-28 | Discharge: 2022-08-28 | Disposition: A | Discharge status: Home / Self Care | Payer: 59 | Source: Ambulatory Visit | Attending: Internal Medicine | Admitting: Internal Medicine

## 2022-08-28 ENCOUNTER — Encounter (HOSPITAL_COMMUNITY): Payer: Self-pay

## 2022-08-28 ENCOUNTER — Other Ambulatory Visit: Payer: 59

## 2022-08-28 DIAGNOSIS — C3490 Malignant neoplasm of unspecified part of unspecified bronchus or lung: Secondary | ICD-10-CM | POA: Diagnosis present

## 2022-08-28 NOTE — Progress Notes (Unsigned)
[name] OFFICE PROGRESS NOTE  [name], MD [place] [address]  DIAGNOSIS: Stage IV (T2b, N2, M1 B) non-small cell lung cancer, adenocarcinoma presented with large central left lower lobe lung mass with left hilar and mediastinal lymphadenopathy as well as abdominal retroperitoneal lymphadenopathy diagnosed in May 2022.  The patient also has bilateral parotid gland nodules that need close monitoring.   [facility] MOLECULAR STUDY: Results with Therapy Associations BIOMARKER METHOD ANALYTE RESULT THERAPY ASSOCIATION BIOMARKER LEVEL* .PD-L1 (22c3) IHC Protein Positive, TPS: 50% BENEFIT cemiplimab, pembrolizumab Level 1 .PD-L1 (28-8) IHC Protein Positive  1+, 50% BENEFIT nivolumab/ipilimumab combination Level 1 .TMB Seq DNA-Tumor High, 18 mut/Mb BENEFIT pembrolizumab Level 2 . alectinib, ceritinib, crizotinib, lorlatinib Level 1 . IHC Protein Negative  0 brigatinib Level 2 . ALK Seq RNA-Tumor Fusion Not Detected LACK OF BENEFIT alectinib, brigatinib, ceritinib, crizotinib, lorlatinib Level 2 .BRAF Seq DNA-Tumor Mutation Not Detected LACK OF BENEFIT dabrafenib and trametinib combination therapy, vemurafenib Level 2 .EGFR Seq DNA-Tumor Mutation Not Detected LACK OF BENEFIT erlotinib, gefitinib Level 2 .KRAS Seq DNA-Tumor Mutation Not Detected LACK OF BENEFIT sotorasib Level 2 .RET Seq RNA-Tumor Fusion Not Detected LACK OF BENEFIT pralsetinib, selpercatinib Level 2 .ROS1 Seq RNA-Tumor Fusion Not Detected LACK OF BENEFIT ceritinib, crizotinib, entrectinib, lorlatinib Level 2 . CNA-Seq DNA-Tumor Amplification Not Detected . MET Seq RNA-Tumor Variant Transcript Not Detected LACK OF BENEFIT crizotinib Level 3  PRIOR THERAPY: Palliative radiotherapy to the large central left lower lobe lung mass under the care of Dr. Lisbeth Renshaw. Last treatment on 06/12/21.  CURRENT THERAPY:  Systemic chemotherapy with carboplatin for AUC of 5, Alimta 500  Mg/M2 and Keytruda 200 Mg IV every 3 weeks.  First dose expected June 14, 2021.  Status post 21 cycles of treatment.  Starting from cycle #5, the patient will begin maintenance Keytruda and Alimta.  Alimta was reduced to 400 mg per metered squared starting from cycle number 12 due to fatigue.  Alimta discontinued starting from cycle #13 due to renal insufficiency     INTERVAL HISTORY: Cameron Harper 46 y.o. male returns to the clinic today for a follow-up visit.  The patient is feeling fairly well today without any concerning complaints. The patient is currently undergoing single agent immunotherapy with Keytruda.  Alimta was discontinued secondary to elevated creatinine and fatigue.  He is established  with a nephrologist at this time and they are monitoring him. The patient denies any recent fever, chills, or night sweats. His dyspnea on exertion is a little bit better. Denies any chest pain, cough, or hemoptysis.  Denies any nausea, vomiting, diarrhea, or constipation.  Denies any headache or visual changes.  Denies any rashes or skin changes.  The patient recently had a restaging CT scan performed.  He is here today for evaluation to review his scan results before starting cycle #22.  MEDICAL HISTORY: Past Medical History:  Diagnosis Date   Chronic back pain    Eczema    GERD (gastroesophageal reflux disease)    Lung cancer (HCC)     ALLERGIES:  is allergic to penicillins and zofran [ondansetron].  MEDICATIONS:  Current Outpatient Medications  Medication Sig Dispense Refill   acetaminophen (TYLENOL) 500 MG tablet Take 500-1,000 mg by mouth every 6 (six) hours as needed (for back pain.). (Patient not taking: Reported on 9/81/1914)     folic acid (FOLVITE) 1 MG tablet Take 1 tablet (1 mg total) by mouth daily. 30 tablet 4   lidocaine-prilocaine (EMLA) cream Apply  1 application topically as needed. 30 g 2   prochlorperazine (COMPAZINE) 10 MG tablet Take 1 tablet (10 mg total) by mouth  every 6 (six) hours as needed for nausea or vomiting. (Patient not taking: Reported on 05/17/2022) 30 tablet 0   No current facility-administered medications for this visit.    SURGICAL HISTORY:  Past Surgical History:  Procedure Laterality Date   BRONCHIAL BRUSHINGS  05/12/2021   Procedure: BRONCHIAL BRUSHINGS;  Surgeon: Garner Nash, DO;  Location: [facility];  Service: Pulmonary;;   BRONCHIAL DILITATION  05/12/2021   Procedure: BRONCHIAL DILITATION;  Surgeon: Garner Nash, DO;  Location: [facility];  Service: Pulmonary;;   BRONCHIAL NEEDLE ASPIRATION BIOPSY  05/12/2021   Procedure: BRONCHIAL NEEDLE ASPIRATION BIOPSIES;  Surgeon: Garner Nash, DO;  Location: [facility];  Service: Pulmonary;;   BRONCHIAL WASHINGS  05/12/2021   Procedure: BRONCHIAL WASHINGS;  Surgeon: Garner Nash, DO;  Location: [facility];  Service: Pulmonary;;   CRYOTHERAPY  05/12/2021   Procedure: CRYOTHERAPY;  Surgeon: Garner Nash, DO;  Location: [facility];  Service: Pulmonary;;   HEMOSTASIS CONTROL  05/12/2021   Procedure: HEMOSTASIS CONTROL;  Surgeon: Garner Nash, DO;  Location: [facility];  Service: Pulmonary;;  epi injection   IR IMAGING GUIDED PORT INSERTION  06/29/2021   Spine injection     Pain control   VIDEO BRONCHOSCOPY WITH ENDOBRONCHIAL ULTRASOUND N/A 05/12/2021   Procedure: VIDEO BRONCHOSCOPY WITH ENDOBRONCHIAL ULTRASOUND;  Surgeon: Garner Nash, DO;  Location: [facility];  Service: Pulmonary;  Laterality: N/A;   WISDOM TOOTH EXTRACTION     no anesthesia involed    REVIEW OF SYSTEMS:   Constitutional: Negative for appetite change, chills, fatigue, fever and unexpected weight change.  HENT: Negative for mouth sores, nosebleeds, sore throat and trouble swallowing.   Eyes: Negative for eye problems and icterus.  Respiratory: Positive for shortness of breath with strenuous activity (improved compared to prior). Negative for cough, hemoptysis, and wheezing.    Cardiovascular: Negative for chest pain and leg swelling.  Gastrointestinal: Negative for abdominal pain, constipation, diarrhea, nausea and vomiting.  Genitourinary: Negative for bladder incontinence, difficulty urinating, dysuria, frequency and hematuria.   Musculoskeletal: Negative for back pain, gait problem, neck pain and neck stiffness.  Skin: Negative for itching and rash.  Neurological: Negative for dizziness, extremity weakness, gait problem, headaches, light-headedness and seizures.  Hematological: Negative for adenopathy. Does not bruise/bleed easily.  Psychiatric/Behavioral: Negative for confusion, depression and sleep disturbance. The patient is not nervous/anxious.     PHYSICAL EXAMINATION:  Blood pressure 122/79, pulse 71, temperature 97.7 F (36.5 C), temperature source Temporal, resp. rate 16, weight 181 lb 4.8 oz (82.2 kg), SpO2 100 %.  ECOG PERFORMANCE STATUS: 1  Constitutional: Oriented to person, place, and time and well-developed, well-nourished, and in no distress.  HENT:  Head: Normocephalic and atraumatic.  Mouth/Throat: Oropharynx is clear and moist. No oropharyngeal exudate.  Eyes: Conjunctivae are normal. Right eye exhibits no discharge. Left eye exhibits no discharge. No scleral icterus.  Neck: Normal range of motion. Neck supple.  Cardiovascular: Normal rate, regular rhythm, normal heart sounds and intact distal pulses.   Pulmonary/Chest: Effort normal and breath sounds normal except diminished breath sounds at base of left lung. No respiratory distress. No wheezes. No rales.  Abdominal: Soft. Bowel sounds are normal. Exhibits no distension and no mass. There is no tenderness.  Musculoskeletal: Normal range of motion. Exhibits no edema (resolved).  Lymphadenopathy:    No cervical adenopathy.  Neurological: Alert and oriented to person, place, and time. Exhibits normal muscle tone. Gait normal. Coordination normal.  Skin: Skin is warm and dry. No rash  noted. Not diaphoretic. No erythema. No pallor.  Psychiatric: Mood, memory and judgment normal.  Vitals reviewed.  LABORATORY DATA: Lab Results  Component Value Date   WBC 7.1 08/30/2022   HGB 11.0 (L) 08/30/2022   HCT 34.4 (L) 08/30/2022   MCV 88.2 08/30/2022   PLT 302 08/30/2022      Chemistry      Component Value Date/Time   NA 141 08/30/2022 1031   K 4.2 08/30/2022 1031   CL 105 08/30/2022 1031   CO2 29 08/30/2022 1031   BUN 16 08/30/2022 1031   CREATININE 1.33 (H) 08/30/2022 1031      Component Value Date/Time   CALCIUM 9.0 08/30/2022 1031   ALKPHOS 117 08/30/2022 1031   AST 11 (L) 08/30/2022 1031   ALT 11 08/30/2022 1031   BILITOT 0.3 08/30/2022 1031       RADIOGRAPHIC STUDIES:  CT Chest Wo Contrast  Result Date: 08/29/2022 CLINICAL DATA:  Non-small cell lung cancer. Immunotherapy ongoing. Radiation therapy complete. * Tracking Code: BO * EXAM: CT CHEST, ABDOMEN AND PELVIS WITHOUT CONTRAST TECHNIQUE: Multidetector CT imaging of the chest, abdomen and pelvis was performed following the standard protocol without IV contrast. RADIATION DOSE REDUCTION: This exam was performed according to the departmental dose-optimization program which includes automated exposure control, adjustment of the mA and/or kV according to patient size and/or use of iterative reconstruction technique. COMPARISON:  CT 06/25/2022 FINDINGS: CT CHEST FINDINGS Cardiovascular: Port in the anterior chest wall with tip in distal SVC. No significant vascular findings. Normal heart size. No pericardial effusion. Mediastinum/Nodes: No axillary or supraclavicular adenopathy. No mediastinal or hilar adenopathy. No pericardial fluid. Esophagus normal. Lungs/Pleura: LEFT perihilar angular consolidation and bronchiectasis typical of radiation change to lung parenchyma. No new or progressive nodularity. Volume loss in LEFT hemithorax similar prior. Stable LEFT basilar effusion. Musculoskeletal: No aggressive osseous  lesion. CT ABDOMEN AND PELVIS FINDINGS Hepatobiliary: No focal hepatic lesion. No biliary ductal dilatation. Gallbladder is normal. Common bile duct is normal. Pancreas: Pancreas is normal. No ductal dilatation. No pancreatic inflammation. Spleen: Normal spleen Adrenals/urinary tract: Adrenal glands and kidneys are normal. The ureters and bladder normal. Stomach/Bowel: Stomach, small bowel, appendix, and cecum are normal. The colon and rectosigmoid colon are normal. Vascular/Lymphatic: Abdominal aorta is normal caliber. There is no retroperitoneal or periportal lymphadenopathy. No pelvic lymphadenopathy. Reproductive: Unremarkable Other: No free fluid. Musculoskeletal: No aggressive osseous lesion. IMPRESSION: 1. Stable post radiation change in the LEFT hemithorax. 2. No evidence of lung cancer recurrence or metastasis. 3. Stable LEFT effusion. 4. Abdomen / Pelvis Impression: 5. No evidence of metastatic disease in the abdomen pelvis. 6. No skeletal metastasis. Electronically Signed   By: Suzy Bouchard M.D.   On: 08/29/2022 09:02   CT Abdomen Pelvis Wo Contrast  Result Date: 08/29/2022 CLINICAL DATA:  Non-small cell lung cancer. Immunotherapy ongoing. Radiation therapy complete. * Tracking Code: BO * EXAM: CT CHEST, ABDOMEN AND PELVIS WITHOUT CONTRAST TECHNIQUE: Multidetector CT imaging of the chest, abdomen and pelvis was performed following the standard protocol without IV contrast. RADIATION DOSE REDUCTION: This exam was performed according to the departmental dose-optimization program which includes automated exposure control, adjustment of the mA and/or kV according to patient size and/or use of iterative reconstruction technique. COMPARISON:  CT 06/25/2022 FINDINGS: CT CHEST FINDINGS Cardiovascular: Port in the anterior chest wall with tip  in distal SVC. No significant vascular findings. Normal heart size. No pericardial effusion. Mediastinum/Nodes: No axillary or supraclavicular adenopathy. No  mediastinal or hilar adenopathy. No pericardial fluid. Esophagus normal. Lungs/Pleura: LEFT perihilar angular consolidation and bronchiectasis typical of radiation change to lung parenchyma. No new or progressive nodularity. Volume loss in LEFT hemithorax similar prior. Stable LEFT basilar effusion. Musculoskeletal: No aggressive osseous lesion. CT ABDOMEN AND PELVIS FINDINGS Hepatobiliary: No focal hepatic lesion. No biliary ductal dilatation. Gallbladder is normal. Common bile duct is normal. Pancreas: Pancreas is normal. No ductal dilatation. No pancreatic inflammation. Spleen: Normal spleen Adrenals/urinary tract: Adrenal glands and kidneys are normal. The ureters and bladder normal. Stomach/Bowel: Stomach, small bowel, appendix, and cecum are normal. The colon and rectosigmoid colon are normal. Vascular/Lymphatic: Abdominal aorta is normal caliber. There is no retroperitoneal or periportal lymphadenopathy. No pelvic lymphadenopathy. Reproductive: Unremarkable Other: No free fluid. Musculoskeletal: No aggressive osseous lesion. IMPRESSION: 1. Stable post radiation change in the LEFT hemithorax. 2. No evidence of lung cancer recurrence or metastasis. 3. Stable LEFT effusion. 4. Abdomen / Pelvis Impression: 5. No evidence of metastatic disease in the abdomen pelvis. 6. No skeletal metastasis. Electronically Signed   By: Suzy Bouchard M.D.   On: 08/29/2022 09:02     ASSESSMENT/PLAN:  This is a very pleasant 46 year old Caucasian male diagnosed with stage IV (T2b, N2, M1 B) non-small cell lung cancer, adenocarcinoma.  He presented with a large central left lower lobe lung mass in addition to left hilar mediastinal lymphadenopathy.  He also has retroperitoneal abdominal lymphadenopathy.  He was diagnosed in May 2022.  His PD-L1 expression is 50%.  His molecular studies by [facility] did not show any evidence of any actionable mutations.  He underwent palliative radiotherapy to the large left lower lobe lung mass  under the care of Dr. Lisbeth Renshaw.  He completed this on 06/12/2021.    The patient is currently undergoing systemic chemotherapy with carboplatin for an AUC of 5, Alimta 500 mg per metered squared, and Keytruda 200 mg IV every 3 weeks.  He is status post 21 cycles.  Starting from cycle #5, the patient has been on maintenance Alimta and Keytruda.  The dose of Alimta was reduced to 400 mg per metered squared starting from cycle #12 due to fatigue.  Alimta was removed from the treatment plan starting from cycle #13 due to renal insufficiency.  Patient recently had a restaging CT scan performed.  Dr. Julien Nordmann personally and independently reviewed the scan discussed results with the patient today.  The scan showed no evidence of disease progression.  Dr. Julien Nordmann recommends that he proceed with cycle #22 today scheduled.   We will see him back for follow-up visit in 3 weeks for evaluation and repeat blood work before starting cycle #23.  He will continue to follow with nephrology regarding his renal insufficiency. They have given him a list of nephrotoxic agents to avoid.   The patient was advised to call immediately if he has any concerning symptoms in the interval. The patient voices understanding of current disease status and treatment options and is in agreement with the current care plan. All questions were answered. The patient knows to call the clinic with any problems, questions or concerns. We can certainly see the patient much sooner if necessary  No orders of the defined types were placed in this encounter.    Jazmina Muhlenkamp L Kimba Lottes, PA-C 08/30/22  ADDENDUM: Hematology/Oncology Attending: I had a face-to-face encounter with the patient today.  I reviewed his  record, lab, scan and recommended his care plan.  This is a very pleasant 46 years old white male with a stage IV non-small cell lung cancer, adenocarcinoma with no actionable mutations and PD-L1 expression of 50%.  The patient initially  underwent palliative radiotherapy to the large left lower lobe lung mass.  He started systemic chemotherapy with carboplatin for AUC of 5, Alimta 500 Mg/M2 and Keytruda 200 Mg IV every 3 weeks status post 21 cycles.  Starting from cycle #5 he was on maintenance treatment with Alimta and Keytruda but Alimta was discontinued from his treatment starting from cycle #13 secondary to renal insufficiency.  The patient has been on treatment with single agent Keytruda starting from cycle #13 and he has been tolerating it fairly well. He had repeat CT scan of the chest, abdomen and pelvis performed recently.  I personally and independently reviewed the scan images and discussed the result and showed the images to the patient today. His scan showed no concerning findings for disease progression or metastasis. I recommended for the patient to continue his current treatment with maintenance Keytruda and he will proceed with cycle #22 today. I will see him back for follow-up visit in 3 weeks for evaluation before the next cycle of his treatment. The patient was advised to call immediately if he has any other concerning symptoms in the interval. The total time spent in the appointment was 30 minutes. Disclaimer: This note was dictated with voice recognition software. Similar sounding words can inadvertently be transcribed and may be missed upon review. Eilleen Kempf, MD

## 2022-08-29 ENCOUNTER — Encounter: Payer: Self-pay | Admitting: Physician Assistant

## 2022-08-29 ENCOUNTER — Encounter: Payer: Self-pay | Admitting: Internal Medicine

## 2022-08-30 ENCOUNTER — Inpatient Hospital Stay: Payer: 59

## 2022-08-30 ENCOUNTER — Inpatient Hospital Stay (HOSPITAL_BASED_OUTPATIENT_CLINIC_OR_DEPARTMENT_OTHER): Payer: 59 | Admitting: Physician Assistant

## 2022-08-30 ENCOUNTER — Other Ambulatory Visit: Payer: Self-pay

## 2022-08-30 VITALS — BP 122/79 | HR 71 | Temp 97.7°F | Resp 16 | Wt 181.3 lb

## 2022-08-30 DIAGNOSIS — C3432 Malignant neoplasm of lower lobe, left bronchus or lung: Secondary | ICD-10-CM

## 2022-08-30 DIAGNOSIS — Z5112 Encounter for antineoplastic immunotherapy: Secondary | ICD-10-CM | POA: Diagnosis not present

## 2022-08-30 DIAGNOSIS — Z95828 Presence of other vascular implants and grafts: Secondary | ICD-10-CM

## 2022-08-30 LAB — CBC WITH DIFFERENTIAL (CANCER CENTER ONLY)
Abs Immature Granulocytes: 0.03 10*3/uL (ref 0.00–0.07)
Basophils Absolute: 0.1 10*3/uL (ref 0.0–0.1)
Basophils Relative: 1 %
Eosinophils Absolute: 0.2 10*3/uL (ref 0.0–0.5)
Eosinophils Relative: 2 %
HCT: 34.4 % — ABNORMAL LOW (ref 39.0–52.0)
Hemoglobin: 11 g/dL — ABNORMAL LOW (ref 13.0–17.0)
Immature Granulocytes: 0 %
Lymphocytes Relative: 13 %
Lymphs Abs: 0.9 10*3/uL (ref 0.7–4.0)
MCH: 28.2 pg (ref 26.0–34.0)
MCHC: 32 g/dL (ref 30.0–36.0)
MCV: 88.2 fL (ref 80.0–100.0)
Monocytes Absolute: 0.8 10*3/uL (ref 0.1–1.0)
Monocytes Relative: 11 %
Neutro Abs: 5.2 10*3/uL (ref 1.7–7.7)
Neutrophils Relative %: 73 %
Platelet Count: 302 10*3/uL (ref 150–400)
RBC: 3.9 MIL/uL — ABNORMAL LOW (ref 4.22–5.81)
RDW: 14.3 % (ref 11.5–15.5)
WBC Count: 7.1 10*3/uL (ref 4.0–10.5)
nRBC: 0 % (ref 0.0–0.2)

## 2022-08-30 LAB — CMP (CANCER CENTER ONLY)
ALT: 11 U/L (ref 0–44)
AST: 11 U/L — ABNORMAL LOW (ref 15–41)
Albumin: 3.8 g/dL (ref 3.5–5.0)
Alkaline Phosphatase: 117 U/L (ref 38–126)
Anion gap: 7 (ref 5–15)
BUN: 16 mg/dL (ref 6–20)
CO2: 29 mmol/L (ref 22–32)
Calcium: 9 mg/dL (ref 8.9–10.3)
Chloride: 105 mmol/L (ref 98–111)
Creatinine: 1.33 mg/dL — ABNORMAL HIGH (ref 0.61–1.24)
GFR, Estimated: >60 mL/min (ref >60)
Glucose, Bld: 97 mg/dL (ref 70–99)
Potassium: 4.2 mmol/L (ref 3.5–5.1)
Sodium: 141 mmol/L (ref 135–145)
Total Bilirubin: 0.3 mg/dL (ref 0.3–1.2)
Total Protein: 7.4 g/dL (ref 6.5–8.1)

## 2022-08-30 LAB — TSH: TSH: 1.985 u[IU]/mL (ref 0.350–4.500)

## 2022-08-30 MED ORDER — SODIUM CHLORIDE 0.9 % IV SOLN: Freq: Once | INTRAVENOUS | Status: AC

## 2022-08-30 MED ORDER — SODIUM CHLORIDE 0.9% FLUSH
10.0000 mL | Freq: Once | INTRAVENOUS | Status: AC
  Administered 2022-08-30: 10 mL

## 2022-08-30 MED ORDER — SODIUM CHLORIDE 0.9 % IV SOLN
200.0000 mg | Freq: Once | INTRAVENOUS | Status: AC
  Administered 2022-08-30: 200 mg via INTRAVENOUS
  Filled 2022-08-30: qty 8

## 2022-08-30 NOTE — Patient Instructions (Signed)
[facility]  Discharge Instructions: Thank you for choosing [facility] to provide your oncology and hematology care.   If you have a lab appointment with the [facility], please go directly to the [facility] and check in at the registration area.   Wear comfortable clothing and clothing appropriate for easy access to any Portacath or PICC line.   We strive to give you quality time with your provider. You may need to reschedule your appointment if you arrive late (15 or more minutes).  Arriving late affects you and other patients whose appointments are after yours.  Also, if you miss three or more appointments without notifying the office, you may be dismissed from the clinic at the provider's discretion.      For prescription refill requests, have your pharmacy contact our office and allow 72 hours for refills to be completed.    Today you received the following chemotherapy and/or immunotherapy agents : Keytruda      To help prevent nausea and vomiting after your treatment, we encourage you to take your nausea medication as directed.  BELOW ARE SYMPTOMS THAT SHOULD BE REPORTED IMMEDIATELY: *FEVER GREATER THAN 100.4 F (38 C) OR HIGHER *CHILLS OR SWEATING *NAUSEA AND VOMITING THAT IS NOT CONTROLLED WITH YOUR NAUSEA MEDICATION *UNUSUAL SHORTNESS OF BREATH *UNUSUAL BRUISING OR BLEEDING *URINARY PROBLEMS (pain or burning when urinating, or frequent urination) *BOWEL PROBLEMS (unusual diarrhea, constipation, pain near the anus) TENDERNESS IN MOUTH AND THROAT WITH OR WITHOUT PRESENCE OF ULCERS (sore throat, sores in mouth, or a toothache) UNUSUAL RASH, SWELLING OR PAIN  UNUSUAL VAGINAL DISCHARGE OR ITCHING   Items with * indicate a potential emergency and should be followed up as soon as possible or go to the Emergency Department if any problems should occur.  Please show the CHEMOTHERAPY ALERT CARD or IMMUNOTHERAPY ALERT CARD at check-in to  the Emergency Department and triage nurse.  Should you have questions after your visit or need to cancel or reschedule your appointment, please contact [facility]  Dept: [phone]  and follow the prompts.  Office hours are 8:00 a.m. to 4:30 p.m. Monday - Friday. Please note that voicemails left after 4:00 p.m. may not be returned until the following business day.  We are closed weekends and major holidays. You have access to a nurse at all times for urgent questions. Please call the main number to the clinic Dept: [phone] and follow the prompts.   For any non-urgent questions, you may also contact your provider using MyChart. We now offer e-Visits for anyone 75 and older to request care online for non-urgent symptoms. For details visit [email or web address].   Also download the MyChart app! Go to the app store, search "MyChart", open the app, select [facility], and log in with your MyChart username and password.  Masks are optional in the cancer centers. If you would like for your care team to wear a mask while they are taking care of you, please let them know. You may have one support person who is at least 48 years old accompany you for your appointments.

## 2022-08-31 ENCOUNTER — Encounter: Payer: Self-pay | Admitting: Physician Assistant

## 2022-08-31 ENCOUNTER — Encounter: Payer: Self-pay | Admitting: Internal Medicine

## 2022-09-03 ENCOUNTER — Other Ambulatory Visit: Payer: Self-pay

## 2022-09-13 ENCOUNTER — Encounter: Payer: Self-pay | Admitting: Physician Assistant

## 2022-09-13 ENCOUNTER — Encounter: Payer: Self-pay | Admitting: Internal Medicine

## 2022-09-14 ENCOUNTER — Telehealth: Payer: Self-pay | Admitting: Medical Oncology

## 2022-09-14 NOTE — Telephone Encounter (Signed)
Insurance questions.

## 2022-09-20 ENCOUNTER — Inpatient Hospital Stay: Payer: 59

## 2022-09-20 ENCOUNTER — Inpatient Hospital Stay: Payer: 59 | Attending: Physician Assistant | Admitting: Internal Medicine

## 2022-09-20 VITALS — BP 109/71 | HR 67 | Temp 98.0°F | Resp 15 | Wt 182.1 lb

## 2022-09-20 DIAGNOSIS — C3432 Malignant neoplasm of lower lobe, left bronchus or lung: Secondary | ICD-10-CM | POA: Insufficient documentation

## 2022-09-20 DIAGNOSIS — Z5112 Encounter for antineoplastic immunotherapy: Secondary | ICD-10-CM | POA: Diagnosis present

## 2022-09-20 DIAGNOSIS — Z95828 Presence of other vascular implants and grafts: Secondary | ICD-10-CM

## 2022-09-20 LAB — CBC WITH DIFFERENTIAL (CANCER CENTER ONLY)
Abs Immature Granulocytes: 0.02 10*3/uL (ref 0.00–0.07)
Basophils Absolute: 0.1 10*3/uL (ref 0.0–0.1)
Basophils Relative: 1 %
Eosinophils Absolute: 0.2 10*3/uL (ref 0.0–0.5)
Eosinophils Relative: 3 %
HCT: 34.5 % — ABNORMAL LOW (ref 39.0–52.0)
Hemoglobin: 11.1 g/dL — ABNORMAL LOW (ref 13.0–17.0)
Immature Granulocytes: 0 %
Lymphocytes Relative: 14 %
Lymphs Abs: 0.9 10*3/uL (ref 0.7–4.0)
MCH: 28.3 pg (ref 26.0–34.0)
MCHC: 32.2 g/dL (ref 30.0–36.0)
MCV: 88 fL (ref 80.0–100.0)
Monocytes Absolute: 0.7 10*3/uL (ref 0.1–1.0)
Monocytes Relative: 11 %
Neutro Abs: 4.8 10*3/uL (ref 1.7–7.7)
Neutrophils Relative %: 71 %
Platelet Count: 285 10*3/uL (ref 150–400)
RBC: 3.92 MIL/uL — ABNORMAL LOW (ref 4.22–5.81)
RDW: 14.3 % (ref 11.5–15.5)
WBC Count: 6.7 10*3/uL (ref 4.0–10.5)
nRBC: 0 % (ref 0.0–0.2)

## 2022-09-20 LAB — CMP (CANCER CENTER ONLY)
ALT: 12 U/L (ref 0–44)
AST: 12 U/L — ABNORMAL LOW (ref 15–41)
Albumin: 3.7 g/dL (ref 3.5–5.0)
Alkaline Phosphatase: 118 U/L (ref 38–126)
Anion gap: 5 (ref 5–15)
BUN: 18 mg/dL (ref 6–20)
CO2: 27 mmol/L (ref 22–32)
Calcium: 9 mg/dL (ref 8.9–10.3)
Chloride: 107 mmol/L (ref 98–111)
Creatinine: 1.41 mg/dL — ABNORMAL HIGH (ref 0.61–1.24)
GFR, Estimated: >60 mL/min (ref >60)
Glucose, Bld: 100 mg/dL — ABNORMAL HIGH (ref 70–99)
Potassium: 4.1 mmol/L (ref 3.5–5.1)
Sodium: 139 mmol/L (ref 135–145)
Total Bilirubin: 0.3 mg/dL (ref 0.3–1.2)
Total Protein: 7.1 g/dL (ref 6.5–8.1)

## 2022-09-20 LAB — TSH: TSH: 1.698 u[IU]/mL (ref 0.350–4.500)

## 2022-09-20 MED ORDER — HEPARIN SOD (PORK) LOCK FLUSH 100 UNIT/ML IV SOLN
500.0000 [IU] | Freq: Once | INTRAVENOUS | Status: AC | PRN
  Administered 2022-09-20: 500 [IU]

## 2022-09-20 MED ORDER — SODIUM CHLORIDE 0.9 % IV SOLN
200.0000 mg | Freq: Once | INTRAVENOUS | Status: AC
  Administered 2022-09-20: 200 mg via INTRAVENOUS
  Filled 2022-09-20: qty 200

## 2022-09-20 MED ORDER — SODIUM CHLORIDE 0.9 % IV SOLN: Freq: Once | INTRAVENOUS | Status: AC

## 2022-09-20 MED ORDER — SODIUM CHLORIDE 0.9% FLUSH
10.0000 mL | INTRAVENOUS | Status: DC | PRN
  Administered 2022-09-20: 10 mL

## 2022-09-20 MED ORDER — SODIUM CHLORIDE 0.9% FLUSH
10.0000 mL | Freq: Once | INTRAVENOUS | Status: AC
  Administered 2022-09-20: 10 mL

## 2022-09-20 NOTE — Patient Instructions (Signed)
[facility]COL[facility]  Discharge Instructions: Thank you for choosing [facility] to provide your oncology and hematology care.   If you have a lab appointment with the [facility], please go directly to the [facility] and check in at the registration area.   Wear comfortable clothing and clothing appropriate for easy access to any Portacath or PICC line.   We strive to give you quality time with your provider. You may need to reschedule your appointment if you arrive late (15 or more minutes).  Arriving late affects you and other patients whose appointments are after yours.  Also, if you miss three or more appointments without notifying the office, you may be dismissed from the clinic at the provider's discretion.      For prescription refill requests, have your pharmacy contact our office and allow 72 hours for refills to be completed.    Today you received the following chemotherapy and/or immunotherapy agents: pembrolizumab      To help prevent nausea and vomiting after your treatment, we encourage you to take your nausea medication as directed.  BELOW ARE SYMPTOMS THAT SHOULD BE REPORTED IMMEDIATELY: *FEVER GREATER THAN 100.4 F (38 C) OR HIGHER *CHILLS OR SWEATING *NAUSEA AND VOMITING THAT IS NOT CONTROLLED WITH YOUR NAUSEA MEDICATION *UNUSUAL SHORTNESS OF BREATH *UNUSUAL BRUISING OR BLEEDING *URINARY PROBLEMS (pain or burning when urinating, or frequent urination) *BOWEL PROBLEMS (unusual diarrhea, constipation, pain near the anus) TENDERNESS IN MOUTH AND THROAT WITH OR WITHOUT PRESENCE OF ULCERS (sore throat, sores in mouth, or a toothache) UNUSUAL RASH, SWELLING OR PAIN  UNUSUAL VAGINAL DISCHARGE OR ITCHING   Items with * indicate a potential emergency and should be followed up as soon as possible or go to the Emergency Department if any problems should occur.  Please show the CHEMOTHERAPY ALERT CARD or IMMUNOTHERAPY ALERT CARD at check-in  to the Emergency Department and triage nurse.  Should you have questions after your visit or need to cancel or reschedule your appointment, please contact [facility]  Dept: [phone]  and follow the prompts.  Office hours are 8:00 a.m. to 4:30 p.m. Monday - Friday. Please note that voicemails left after 4:00 p.m. may not be returned until the following business day.  We are closed weekends and major holidays. You have access to a nurse at all times for urgent questions. Please call the main number to the clinic Dept: [phone] and follow the prompts.   For any non-urgent questions, you may also contact your provider using MyChart. We now offer e-Visits for anyone 20 and older to request care online for non-urgent symptoms. For details visit [email or web address].   Also download the MyChart app! Go to the app store, search "MyChart", open the app, select [facility], and log in with your MyChart username and password.  Masks are optional in the cancer centers. If you would like for your care team to wear a mask while they are taking care of you, please let them know. You may have one support person who is at least 48 years old accompany you for your appointments.

## 2022-09-20 NOTE — Progress Notes (Signed)
[facility] [phone]   [phone]  OFFICE PROGRESS NOTE  [name], MD [place] [address]  DIAGNOSIS:  Stage IV (T2b, N2, M1 B) non-small cell lung cancer, adenocarcinoma presented with large central left lower lobe lung mass with left hilar and mediastinal lymphadenopathy as well as abdominal retroperitoneal lymphadenopathy diagnosed in May 2022.  The patient also has bilateral parotid gland nodules that need close monitoring.  [facility] MOLECULAR STUDY: Results with Therapy Associations BIOMARKER METHOD ANALYTE RESULT THERAPY ASSOCIATION BIOMARKER LEVEL* .PD-L1 (22c3) IHC Protein Positive, TPS: 50% BENEFIT cemiplimab, pembrolizumab Level 1 .PD-L1 (28-8) IHC Protein Positive  1+, 50% BENEFIT nivolumab/ipilimumab combination Level 1 .TMB Seq DNA-Tumor High, 18 mut/Mb BENEFIT pembrolizumab Level 2 . alectinib, ceritinib, crizotinib, lorlatinib Level 1 . IHC Protein Negative  0 brigatinib Level 2 . ALK Seq RNA-Tumor Fusion Not Detected LACK OF BENEFIT alectinib, brigatinib, ceritinib, crizotinib, lorlatinib Level 2 .BRAF Seq DNA-Tumor Mutation Not Detected LACK OF BENEFIT dabrafenib and trametinib combination therapy, vemurafenib Level 2 .EGFR Seq DNA-Tumor Mutation Not Detected LACK OF BENEFIT erlotinib, gefitinib Level 2 .KRAS Seq DNA-Tumor Mutation Not Detected LACK OF BENEFIT sotorasib Level 2 .RET Seq RNA-Tumor Fusion Not Detected LACK OF BENEFIT pralsetinib, selpercatinib Level 2 .ROS1 Seq RNA-Tumor Fusion Not Detected LACK OF BENEFIT ceritinib, crizotinib, entrectinib, lorlatinib Level 2 . CNA-Seq DNA-Tumor Amplification Not Detected .MET Seq RNA-Tumor Variant Transcript Not Detected LACK OF BENEFIT crizotinib Level 3  PRIOR THERAPY: Palliative radiotherapy to the large central left lower lobe lung mass under the care of Dr. Lisbeth Renshaw.  CURRENT THERAPY: Systemic chemotherapy with carboplatin for AUC  of 5, Alimta 500 Mg/M2 and possibly Keytruda 200 Mg IV every 3 weeks.  First dose expected June 14, 2021.  Starting from cycle #5 the patient is on maintenance treatment with Alimta and Keytruda every 3 weeks.  Status post 22 cycles of treatment.  Starting from cycle #12 the dose of Alimta to 400 Mg/M2 because of his increasing fatigue and side effects.  Alimta was discontinued secondary to renal insufficiency.  INTERVAL HISTORY: Cameron Harper 46 y.o. male returns to the clinic today for follow-up visit.  The patient is feeling fine today with no concerning complaints.  He denied having any chest pain, shortness of breath, cough or hemoptysis.  He denied having any nausea, vomiting, diarrhea or constipation.  He has no headache or visual changes.  He denied having any recent weight loss or night sweats.  He has been tolerating his treatment with maintenance Keytruda fairly well.  The patient is here today for evaluation before starting cycle #23.  MEDICAL HISTORY: Past Medical History:  Diagnosis Date   Chronic back pain    Eczema    GERD (gastroesophageal reflux disease)    Lung cancer (HCC)     ALLERGIES:  is allergic to penicillins and zofran [ondansetron].  MEDICATIONS:  Current Outpatient Medications  Medication Sig Dispense Refill   acetaminophen (TYLENOL) 500 MG tablet Take 500-1,000 mg by mouth every 6 (six) hours as needed (for back pain.). (Patient not taking: Reported on 8/58/8502)     folic acid (FOLVITE) 1 MG tablet Take 1 tablet (1 mg total) by mouth daily. 30 tablet 4   lidocaine-prilocaine (EMLA) cream Apply 1 application topically as needed. 30 g 2   prochlorperazine (COMPAZINE) 10 MG tablet Take 1 tablet (10 mg total) by mouth every 6 (six) hours as needed for nausea or vomiting. (Patient not taking: Reported on 05/17/2022)  30 tablet 0   No current facility-administered medications for this visit.    SURGICAL HISTORY:  Past Surgical History:  Procedure Laterality  Date   BRONCHIAL BRUSHINGS  05/12/2021   Procedure: BRONCHIAL BRUSHINGS;  Surgeon: Garner Nash, DO;  Location: [place];  Service: Pulmonary;;   BRONCHIAL DILITATION  05/12/2021   Procedure: BRONCHIAL DILITATION;  Surgeon: Garner Nash, DO;  Location: [place];  Service: Pulmonary;;   BRONCHIAL NEEDLE ASPIRATION BIOPSY  05/12/2021   Procedure: BRONCHIAL NEEDLE ASPIRATION BIOPSIES;  Surgeon: Garner Nash, DO;  Location: [place];  Service: Pulmonary;;   BRONCHIAL WASHINGS  05/12/2021   Procedure: BRONCHIAL WASHINGS;  Surgeon: Garner Nash, DO;  Location: [place];  Service: Pulmonary;;   CRYOTHERAPY  05/12/2021   Procedure: CRYOTHERAPY;  Surgeon: Garner Nash, DO;  Location: [place];  Service: Pulmonary;;   HEMOSTASIS CONTROL  05/12/2021   Procedure: HEMOSTASIS CONTROL;  Surgeon: Garner Nash, DO;  Location: [name];  Service: Pulmonary;;  epi injection   IR IMAGING GUIDED PORT INSERTION  06/29/2021   Spine injection     Pain control   VIDEO BRONCHOSCOPY WITH ENDOBRONCHIAL ULTRASOUND N/A 05/12/2021   Procedure: VIDEO BRONCHOSCOPY WITH ENDOBRONCHIAL ULTRASOUND;  Surgeon: Garner Nash, DO;  Location: [facility];  Service: Pulmonary;  Laterality: N/A;   WISDOM TOOTH EXTRACTION     no anesthesia involed    REVIEW OF SYSTEMS:  A comprehensive review of systems was negative.   PHYSICAL EXAMINATION: General appearance: alert, cooperative, and no distress Head: Normocephalic, without obvious abnormality, atraumatic Neck: no adenopathy, no JVD, supple, symmetrical, trachea midline, and thyroid not enlarged, symmetric, no tenderness/mass/nodules Lymph nodes: Cervical, supraclavicular, and axillary nodes normal. Resp: clear to auscultation bilaterally Back: symmetric, no curvature. ROM normal. No CVA tenderness. Cardio: regular rate and rhythm, S1, S2 normal, no murmur, click, rub or gallop GI: soft, non-tender; bowel sounds normal; no masses,  no  organomegaly Extremities: extremities normal, atraumatic, no cyanosis or edema  ECOG PERFORMANCE STATUS: 0 - Asymptomatic  Blood pressure 109/71, pulse 67, temperature 98 F (36.7 C), temperature source Oral, resp. rate 15, weight 182 lb 1.6 oz (82.6 kg), SpO2 100 %.  LABORATORY DATA: Lab Results  Component Value Date   WBC 6.7 09/20/2022   HGB 11.1 (L) 09/20/2022   HCT 34.5 (L) 09/20/2022   MCV 88.0 09/20/2022   PLT 285 09/20/2022      Chemistry      Component Value Date/Time   NA 139 09/20/2022 0933   K 4.1 09/20/2022 0933   CL 107 09/20/2022 0933   CO2 27 09/20/2022 0933   BUN 18 09/20/2022 0933   CREATININE 1.41 (H) 09/20/2022 0933      Component Value Date/Time   CALCIUM 9.0 09/20/2022 0933   ALKPHOS 118 09/20/2022 0933   AST 12 (L) 09/20/2022 0933   ALT 12 09/20/2022 0933   BILITOT 0.3 09/20/2022 0933       RADIOGRAPHIC STUDIES: CT Chest Wo Contrast  Result Date: 08/29/2022 CLINICAL DATA:  Non-small cell lung cancer. Immunotherapy ongoing. Radiation therapy complete. * Tracking Code: BO * EXAM: CT CHEST, ABDOMEN AND PELVIS WITHOUT CONTRAST TECHNIQUE: Multidetector CT imaging of the chest, abdomen and pelvis was performed following the standard protocol without IV contrast. RADIATION DOSE REDUCTION: This exam was performed according to the departmental dose-optimization program which includes automated exposure control, adjustment of the mA and/or kV according to patient size and/or use of iterative reconstruction technique. COMPARISON:  CT 06/25/2022  FINDINGS: CT CHEST FINDINGS Cardiovascular: Port in the anterior chest wall with tip in distal SVC. No significant vascular findings. Normal heart size. No pericardial effusion. Mediastinum/Nodes: No axillary or supraclavicular adenopathy. No mediastinal or hilar adenopathy. No pericardial fluid. Esophagus normal. Lungs/Pleura: LEFT perihilar angular consolidation and bronchiectasis typical of radiation change to lung  parenchyma. No new or progressive nodularity. Volume loss in LEFT hemithorax similar prior. Stable LEFT basilar effusion. Musculoskeletal: No aggressive osseous lesion. CT ABDOMEN AND PELVIS FINDINGS Hepatobiliary: No focal hepatic lesion. No biliary ductal dilatation. Gallbladder is normal. Common bile duct is normal. Pancreas: Pancreas is normal. No ductal dilatation. No pancreatic inflammation. Spleen: Normal spleen Adrenals/urinary tract: Adrenal glands and kidneys are normal. The ureters and bladder normal. Stomach/Bowel: Stomach, small bowel, appendix, and cecum are normal. The colon and rectosigmoid colon are normal. Vascular/Lymphatic: Abdominal aorta is normal caliber. There is no retroperitoneal or periportal lymphadenopathy. No pelvic lymphadenopathy. Reproductive: Unremarkable Other: No free fluid. Musculoskeletal: No aggressive osseous lesion. IMPRESSION: 1. Stable post radiation change in the LEFT hemithorax. 2. No evidence of lung cancer recurrence or metastasis. 3. Stable LEFT effusion. 4. Abdomen / Pelvis Impression: 5. No evidence of metastatic disease in the abdomen pelvis. 6. No skeletal metastasis. Electronically Signed   By: Suzy Bouchard M.D.   On: 08/29/2022 09:02   CT Abdomen Pelvis Wo Contrast  Result Date: 08/29/2022 CLINICAL DATA:  Non-small cell lung cancer. Immunotherapy ongoing. Radiation therapy complete. * Tracking Code: BO * EXAM: CT CHEST, ABDOMEN AND PELVIS WITHOUT CONTRAST TECHNIQUE: Multidetector CT imaging of the chest, abdomen and pelvis was performed following the standard protocol without IV contrast. RADIATION DOSE REDUCTION: This exam was performed according to the departmental dose-optimization program which includes automated exposure control, adjustment of the mA and/or kV according to patient size and/or use of iterative reconstruction technique. COMPARISON:  CT 06/25/2022 FINDINGS: CT CHEST FINDINGS Cardiovascular: Port in the anterior chest wall with tip in  distal SVC. No significant vascular findings. Normal heart size. No pericardial effusion. Mediastinum/Nodes: No axillary or supraclavicular adenopathy. No mediastinal or hilar adenopathy. No pericardial fluid. Esophagus normal. Lungs/Pleura: LEFT perihilar angular consolidation and bronchiectasis typical of radiation change to lung parenchyma. No new or progressive nodularity. Volume loss in LEFT hemithorax similar prior. Stable LEFT basilar effusion. Musculoskeletal: No aggressive osseous lesion. CT ABDOMEN AND PELVIS FINDINGS Hepatobiliary: No focal hepatic lesion. No biliary ductal dilatation. Gallbladder is normal. Common bile duct is normal. Pancreas: Pancreas is normal. No ductal dilatation. No pancreatic inflammation. Spleen: Normal spleen Adrenals/urinary tract: Adrenal glands and kidneys are normal. The ureters and bladder normal. Stomach/Bowel: Stomach, small bowel, appendix, and cecum are normal. The colon and rectosigmoid colon are normal. Vascular/Lymphatic: Abdominal aorta is normal caliber. There is no retroperitoneal or periportal lymphadenopathy. No pelvic lymphadenopathy. Reproductive: Unremarkable Other: No free fluid. Musculoskeletal: No aggressive osseous lesion. IMPRESSION: 1. Stable post radiation change in the LEFT hemithorax. 2. No evidence of lung cancer recurrence or metastasis. 3. Stable LEFT effusion. 4. Abdomen / Pelvis Impression: 5. No evidence of metastatic disease in the abdomen pelvis. 6. No skeletal metastasis. Electronically Signed   By: Suzy Bouchard M.D.   On: 08/29/2022 09:02    ASSESSMENT AND PLAN: This is a very pleasant 46 years old white male recently diagnosed with a stage IV (T2b, N2, M1 B) non-small cell lung cancer, adenocarcinoma presented with large central left lower lobe lung mass in addition to left hilar and mediastinal lymphadenopathy as well as retroperitoneal abdominal lymph node diagnosed in  May 2022.  The patient is currently undergoing palliative  radiotherapy to the large left lower lobe lung mass under the care of of Dr. Lisbeth Renshaw.  He is expected to complete this course of treatment on June 12, 2021. MRI of the brain showed no evidence of metastatic disease to the brain. His molecular studies by [facility] showed no actionable mutations and PD-L1 expression of 50%. He is currently undergoing systemic chemotherapy with carboplatin for AUC of 5, Alimta 500 Mg/M2 and Keytruda 200 Mg IV every 3 weeks.  Starting from cycle #5 he is on maintenance treatment with Alimta at 400 Mg/M2 and Keytruda 200 Mg IV every 3 weeks status post 22 cycles.  He is currently on single agent Keytruda secondary to renal insufficiency. He has been tolerating his treatment well with no concerning adverse effects. I recommended for the patient to proceed with cycle #23 today as planned. He will come back for follow-up visit in 3 weeks for evaluation before the next cycle of his treatment. For the renal insufficiency, he is currently followed by nephrology. For the anemia of chronic disease, he will continue with the oral iron supplements and we will continue to monitor it closely and consider him for transfusion if needed. He was advised to call immediately if he has any other concerning symptoms in the interval. The patient voices understanding of current disease status and treatment options and is in agreement with the current care plan. All questions were answered. The patient knows to call the clinic with any problems, questions or concerns. We can certainly see the patient much sooner if necessary.  Disclaimer: This note was dictated with voice recognition software. Similar sounding words can inadvertently be transcribed and may not be corrected upon review.

## 2022-09-21 ENCOUNTER — Other Ambulatory Visit: Payer: Self-pay

## 2022-09-22 ENCOUNTER — Other Ambulatory Visit: Payer: Self-pay

## 2022-10-03 ENCOUNTER — Other Ambulatory Visit (HOSPITAL_COMMUNITY): Payer: Self-pay

## 2022-10-09 ENCOUNTER — Telehealth: Payer: Self-pay

## 2022-10-09 ENCOUNTER — Other Ambulatory Visit: Payer: Self-pay

## 2022-10-09 ENCOUNTER — Encounter: Payer: Self-pay | Admitting: Physician Assistant

## 2022-10-09 ENCOUNTER — Encounter: Payer: Self-pay | Admitting: Internal Medicine

## 2022-10-09 NOTE — Telephone Encounter (Signed)
This nurse called in becuae he received a message stating that his appointment for 11/9 has been changed.  This nurse advised that appointments for 11/9 had not been changed, verified that times for labs , office visit and appointments.  Advised the message was for his appointments on 11/30.  Patient agreed with all appointment times scheduled.  No questions or concerns at this time.

## 2022-10-09 NOTE — Progress Notes (Unsigned)
[facility] OFFICE PROGRESS NOTE  [name], MD [place] [address]  DIAGNOSIS:  Stage IV (T2b, N2, M1 B) non-small cell lung cancer, adenocarcinoma presented with large central left lower lobe lung mass with left hilar and mediastinal lymphadenopathy as well as abdominal retroperitoneal lymphadenopathy diagnosed in May 2022.  The patient also has bilateral parotid gland nodules that need close monitoring.   CARIS MOLECULAR STUDY: Results with Therapy Associations BIOMARKER METHOD ANALYTE RESULT THERAPY ASSOCIATION BIOMARKER LEVEL* .PD-L1 (22c3) IHC Protein Positive, TPS: 50% BENEFIT cemiplimab, pembrolizumab Level 1 .PD-L1 (28-8) IHC Protein Positive  1+, 50% BENEFIT nivolumab/ipilimumab combination Level 1 .TMB Seq DNA-Tumor High, 18 mut/Mb BENEFIT pembrolizumab Level 2 . alectinib, ceritinib, crizotinib, lorlatinib Level 1 . IHC Protein Negative  0 brigatinib Level 2 . ALK Seq RNA-Tumor Fusion Not Detected LACK OF BENEFIT alectinib, brigatinib, ceritinib, crizotinib, lorlatinib Level 2 .BRAF Seq DNA-Tumor Mutation Not Detected LACK OF BENEFIT dabrafenib and trametinib combination therapy, vemurafenib Level 2 .EGFR Seq DNA-Tumor Mutation Not Detected LACK OF BENEFIT erlotinib, gefitinib Level 2 .KRAS Seq DNA-Tumor Mutation Not Detected LACK OF BENEFIT sotorasib Level 2 .RET Seq RNA-Tumor Fusion Not Detected LACK OF BENEFIT pralsetinib, selpercatinib Level 2 .ROS1 Seq RNA-Tumor Fusion Not Detected LACK OF BENEFIT ceritinib, crizotinib, entrectinib, lorlatinib Level 2 . CNA-Seq DNA-Tumor Amplification Not Detected . MET Seq RNA-Tumor Variant Transcript Not Detected LACK OF BENEFIT crizotinib Level 3  PRIOR THERAPY: Palliative radiotherapy to the large central left lower lobe lung mass under the care of Dr. Lisbeth Renshaw. Last treatment on 06/12/21.   CURRENT THERAPY: Systemic chemotherapy with carboplatin for AUC of 5, Alimta 500  Mg/M2 and Keytruda 200 Mg IV every 3 weeks.  First dose expected June 14, 2021.  Status post 21 cycles of treatment.  Starting from cycle #5, the patient will begin maintenance Keytruda and Alimta.  Alimta was reduced to 400 mg per metered squared starting from cycle number 23 due to fatigue.  Alimta discontinued starting from cycle #13 due to renal insufficiency    INTERVAL HISTORY: Cameron Harper 46 y.o. male returns  to the clinic today for a follow-up visit.  The patient is feeling fairly well today without any concerning complaints. The patient is currently undergoing single agent immunotherapy with Keytruda.  Alimta was discontinued secondary to elevated creatinine and fatigue.  He is established  with a nephrologist at this time and they are monitoring him. The patient denies any recent fever, chills, or night sweats. His dyspnea on exertion is a little bit better. Denies any chest pain, cough, or hemoptysis.  Denies any nausea, vomiting, diarrhea, or constipation.  Denies any headache or visual changes.  Denies any rashes or skin changes.  He is here today for evaluation to review his scan results before starting cycle #24.   MEDICAL HISTORY: Past Medical History:  Diagnosis Date   Chronic back pain    Eczema    GERD (gastroesophageal reflux disease)    Lung cancer (HCC)     ALLERGIES:  is allergic to penicillins and zofran [ondansetron].  MEDICATIONS:  Current Outpatient Medications  Medication Sig Dispense Refill   acetaminophen (TYLENOL) 500 MG tablet Take 500-1,000 mg by mouth every 6 (six) hours as needed (for back pain.). (Patient not taking: Reported on 01/26/8249)     folic acid (FOLVITE) 1 MG tablet Take 1 tablet (1 mg total) by mouth daily. 30 tablet 4   lidocaine-prilocaine (EMLA) cream Apply 1 application topically as needed. 30 g 2  prochlorperazine (COMPAZINE) 10 MG tablet Take 1 tablet (10 mg total) by mouth every 6 (six) hours as needed for nausea or vomiting.  (Patient not taking: Reported on 05/17/2022) 30 tablet 0   No current facility-administered medications for this visit.    SURGICAL HISTORY:  Past Surgical History:  Procedure Laterality Date   BRONCHIAL BRUSHINGS  05/12/2021   Procedure: BRONCHIAL BRUSHINGS;  Surgeon: Garner Nash, DO;  Location: [facility];  Service: Pulmonary;;   BRONCHIAL DILITATION  05/12/2021   Procedure: BRONCHIAL DILITATION;  Surgeon: Garner Nash, DO;  Location: [facility];  Service: Pulmonary;;   BRONCHIAL NEEDLE ASPIRATION BIOPSY  05/12/2021   Procedure: BRONCHIAL NEEDLE ASPIRATION BIOPSIES;  Surgeon: Garner Nash, DO;  Location: [place];  Service: Pulmonary;;   BRONCHIAL WASHINGS  05/12/2021   Procedure: BRONCHIAL WASHINGS;  Surgeon: Garner Nash, DO;  Location: [place];  Service: Pulmonary;;   CRYOTHERAPY  05/12/2021   Procedure: CRYOTHERAPY;  Surgeon: Garner Nash, DO;  Location: [facility];  Service: Pulmonary;;   HEMOSTASIS CONTROL  05/12/2021   Procedure: HEMOSTASIS CONTROL;  Surgeon: Garner Nash, DO;  Location: [facility] [facility];  Service: Pulmonary;;  epi injection   IR IMAGING GUIDED PORT INSERTION  06/29/2021   Spine injection     Pain control   VIDEO BRONCHOSCOPY WITH ENDOBRONCHIAL ULTRASOUND N/A 05/12/2021   Procedure: VIDEO BRONCHOSCOPY WITH ENDOBRONCHIAL ULTRASOUND;  Surgeon: Garner Nash, DO;  Location: [place];  Service: Pulmonary;  Laterality: N/A;   WISDOM TOOTH EXTRACTION     no anesthesia involed    REVIEW OF SYSTEMS:   Review of Systems  Constitutional: Negative for appetite change, chills, fatigue, fever and unexpected weight change.  HENT:   Negative for mouth sores, nosebleeds, sore throat and trouble swallowing.   Eyes: Negative for eye problems and icterus.  Respiratory: Negative for cough, hemoptysis, shortness of breath and wheezing.   Cardiovascular: Negative for chest pain and leg swelling.  Gastrointestinal: Negative for abdominal pain,  constipation, diarrhea, nausea and vomiting.  Genitourinary: Negative for bladder incontinence, difficulty urinating, dysuria, frequency and hematuria.   Musculoskeletal: Negative for back pain, gait problem, neck pain and neck stiffness.  Skin: Negative for itching and rash.  Neurological: Negative for dizziness, extremity weakness, gait problem, headaches, light-headedness and seizures.  Hematological: Negative for adenopathy. Does not bruise/bleed easily.  Psychiatric/Behavioral: Negative for confusion, depression and sleep disturbance. The patient is not nervous/anxious.     PHYSICAL EXAMINATION:  There were no vitals taken for this visit.  ECOG PERFORMANCE STATUS: {CHL ONC ECOG [id number]  Physical Exam  Constitutional: Oriented to person, place, and time and well-developed, well-nourished, and in no distress. No distress.  HENT:  Head: Normocephalic and atraumatic.  Mouth/Throat: Oropharynx is clear and moist. No oropharyngeal exudate.  Eyes: Conjunctivae are normal. Right eye exhibits no discharge. Left eye exhibits no discharge. No scleral icterus.  Neck: Normal range of motion. Neck supple.  Cardiovascular: Normal rate, regular rhythm, normal heart sounds and intact distal pulses.   Pulmonary/Chest: Effort normal and breath sounds normal. No respiratory distress. No wheezes. No rales.  Abdominal: Soft. Bowel sounds are normal. Exhibits no distension and no mass. There is no tenderness.  Musculoskeletal: Normal range of motion. Exhibits no edema.  Lymphadenopathy:    No cervical adenopathy.  Neurological: Alert and oriented to person, place, and time. Exhibits normal muscle tone. Gait normal. Coordination normal.  Skin: Skin is warm and dry. No rash noted. Not diaphoretic. No erythema. No pallor.  Psychiatric: Mood, memory and judgment normal.  Vitals reviewed.  LABORATORY DATA: Lab Results  Component Value Date   WBC 6.7 09/20/2022   HGB 11.1 (L) 09/20/2022   HCT  34.5 (L) 09/20/2022   MCV 88.0 09/20/2022   PLT 285 09/20/2022      Chemistry      Component Value Date/Time   NA 139 09/20/2022 0933   K 4.1 09/20/2022 0933   CL 107 09/20/2022 0933   CO2 27 09/20/2022 0933   BUN 18 09/20/2022 0933   CREATININE 1.41 (H) 09/20/2022 0933      Component Value Date/Time   CALCIUM 9.0 09/20/2022 0933   ALKPHOS 118 09/20/2022 0933   AST 12 (L) 09/20/2022 0933   ALT 12 09/20/2022 0933   BILITOT 0.3 09/20/2022 0933       RADIOGRAPHIC STUDIES:  No results found.   ASSESSMENT/PLAN:  This is a very pleasant 46 year old Caucasian male diagnosed with stage IV (T2b, N2, M1 B) non-small cell lung cancer, adenocarcinoma.  He presented with a large central left lower lobe lung mass in addition to left hilar mediastinal lymphadenopathy.  He also has retroperitoneal abdominal lymphadenopathy.  He was diagnosed in May 2022.  His PD-L1 expression is 50%.  His molecular studies by [facility] did not show any evidence of any actionable mutations.  He underwent palliative radiotherapy to the large left lower lobe lung mass under the care of Dr. Lisbeth Renshaw.  He completed this on 06/12/2021.    The patient is currently undergoing systemic chemotherapy with carboplatin for an AUC of 5, Alimta 500 mg per metered squared, and Keytruda 200 mg IV every 3 weeks.  He is status post 23 cycles.  Starting from cycle #5, the patient has been on maintenance Alimta and Keytruda.  The dose of Alimta was reduced to 400 mg per metered squared starting from cycle #12 due to fatigue.  Alimta was removed from the treatment plan starting from cycle #13 due to renal insufficiency.   Labs were reviewed.  Recommend that he proceed with cycle #24 today scheduled.   We will see him back for follow-up visit in 3 weeks for evaluation and repeat blood work before starting cycle #25.  He will continue to follow with nephrology regarding his renal insufficiency. They have given him a list of nephrotoxic  agents to avoid.   The patient was advised to call immediately if she has any concerning symptoms in the interval. The patient voices understanding of current disease status and treatment options and is in agreement with the current care plan. All questions were answered. The patient knows to call the clinic with any problems, questions or concerns. We can certainly see the patient much sooner if necessary       No orders of the defined types were placed in this encounter.    I spent {CHL ONC TIME VISIT - PIRJJ:[id number]} counseling the patient face to face. The total time spent in the appointment was {CHL ONC TIME VISIT - ZSWFU:[id number]}.  Cassandra L Heilingoetter, PA-C 10/09/22

## 2022-10-11 ENCOUNTER — Inpatient Hospital Stay: Payer: 59

## 2022-10-11 ENCOUNTER — Other Ambulatory Visit: Payer: Self-pay

## 2022-10-11 ENCOUNTER — Inpatient Hospital Stay: Payer: 59 | Attending: Physician Assistant

## 2022-10-11 ENCOUNTER — Inpatient Hospital Stay (HOSPITAL_BASED_OUTPATIENT_CLINIC_OR_DEPARTMENT_OTHER): Payer: 59 | Admitting: Physician Assistant

## 2022-10-11 VITALS — BP 117/77 | HR 64 | Temp 98.4°F | Resp 18 | Wt 183.1 lb

## 2022-10-11 DIAGNOSIS — C3432 Malignant neoplasm of lower lobe, left bronchus or lung: Secondary | ICD-10-CM

## 2022-10-11 DIAGNOSIS — Z5112 Encounter for antineoplastic immunotherapy: Secondary | ICD-10-CM

## 2022-10-11 DIAGNOSIS — R5383 Other fatigue: Secondary | ICD-10-CM | POA: Diagnosis not present

## 2022-10-11 LAB — CBC WITH DIFFERENTIAL (CANCER CENTER ONLY)
Abs Immature Granulocytes: 0.02 10*3/uL (ref 0.00–0.07)
Basophils Absolute: 0.1 10*3/uL (ref 0.0–0.1)
Basophils Relative: 1 %
Eosinophils Absolute: 0.3 10*3/uL (ref 0.0–0.5)
Eosinophils Relative: 4 %
HCT: 35.4 % — ABNORMAL LOW (ref 39.0–52.0)
Hemoglobin: 11.4 g/dL — ABNORMAL LOW (ref 13.0–17.0)
Immature Granulocytes: 0 %
Lymphocytes Relative: 13 %
Lymphs Abs: 0.9 10*3/uL (ref 0.7–4.0)
MCH: 28.7 pg (ref 26.0–34.0)
MCHC: 32.2 g/dL (ref 30.0–36.0)
MCV: 89.2 fL (ref 80.0–100.0)
Monocytes Absolute: 0.8 10*3/uL (ref 0.1–1.0)
Monocytes Relative: 11 %
Neutro Abs: 4.9 10*3/uL (ref 1.7–7.7)
Neutrophils Relative %: 71 %
Platelet Count: 286 10*3/uL (ref 150–400)
RBC: 3.97 MIL/uL — ABNORMAL LOW (ref 4.22–5.81)
RDW: 14.2 % (ref 11.5–15.5)
WBC Count: 7 10*3/uL (ref 4.0–10.5)
nRBC: 0 % (ref 0.0–0.2)

## 2022-10-11 LAB — CMP (CANCER CENTER ONLY)
ALT: 11 U/L (ref 0–44)
AST: 11 U/L — ABNORMAL LOW (ref 15–41)
Albumin: 3.8 g/dL (ref 3.5–5.0)
Alkaline Phosphatase: 115 U/L (ref 38–126)
Anion gap: 5 (ref 5–15)
BUN: 17 mg/dL (ref 6–20)
CO2: 27 mmol/L (ref 22–32)
Calcium: 9.1 mg/dL (ref 8.9–10.3)
Chloride: 108 mmol/L (ref 98–111)
Creatinine: 1.34 mg/dL — ABNORMAL HIGH (ref 0.61–1.24)
GFR, Estimated: >60 mL/min (ref >60)
Glucose, Bld: 78 mg/dL (ref 70–99)
Potassium: 4.3 mmol/L (ref 3.5–5.1)
Sodium: 140 mmol/L (ref 135–145)
Total Bilirubin: 0.3 mg/dL (ref 0.3–1.2)
Total Protein: 7.3 g/dL (ref 6.5–8.1)

## 2022-10-11 LAB — TSH: TSH: 2.117 u[IU]/mL (ref 0.350–4.500)

## 2022-10-11 MED ORDER — SODIUM CHLORIDE 0.9 % IV SOLN
200.0000 mg | Freq: Once | INTRAVENOUS | Status: AC
  Administered 2022-10-11: 200 mg via INTRAVENOUS
  Filled 2022-10-11: qty 200

## 2022-10-11 MED ORDER — SODIUM CHLORIDE 0.9% FLUSH
10.0000 mL | Freq: Once | INTRAVENOUS | Status: AC
  Administered 2022-10-11: 10 mL via INTRAVENOUS

## 2022-10-11 MED ORDER — SODIUM CHLORIDE 0.9% FLUSH
10.0000 mL | INTRAVENOUS | Status: DC | PRN
  Administered 2022-10-11: 10 mL

## 2022-10-11 MED ORDER — SODIUM CHLORIDE 0.9 % IV SOLN: Freq: Once | INTRAVENOUS | Status: AC

## 2022-10-11 MED ORDER — HEPARIN SOD (PORK) LOCK FLUSH 100 UNIT/ML IV SOLN
500.0000 [IU] | Freq: Once | INTRAVENOUS | Status: AC | PRN
  Administered 2022-10-11: 500 [IU]

## 2022-10-11 NOTE — Patient Instructions (Signed)
[facility]  Discharge Instructions: Thank you for choosing [facility] to provide your oncology and hematology care.   If you have a lab appointment with the [facility], please go directly to the [facility] and check in at the registration area.   Wear comfortable clothing and clothing appropriate for easy access to any Portacath or PICC line.   We strive to give you quality time with your provider. You may need to reschedule your appointment if you arrive late (15 or more minutes).  Arriving late affects you and other patients whose appointments are after yours.  Also, if you miss three or more appointments without notifying the office, you may be dismissed from the clinic at the provider's discretion.      For prescription refill requests, have your pharmacy contact our office and allow 72 hours for refills to be completed.    Today you received the following chemotherapy and/or immunotherapy agents [name]      To help prevent nausea and vomiting after your treatment, we encourage you to take your nausea medication as directed.  BELOW ARE SYMPTOMS THAT SHOULD BE REPORTED IMMEDIATELY: *FEVER GREATER THAN 100.4 F (38 C) OR HIGHER *CHILLS OR SWEATING *NAUSEA AND VOMITING THAT IS NOT CONTROLLED WITH YOUR NAUSEA MEDICATION *UNUSUAL SHORTNESS OF BREATH *UNUSUAL BRUISING OR BLEEDING *URINARY PROBLEMS (pain or burning when urinating, or frequent urination) *BOWEL PROBLEMS (unusual diarrhea, constipation, pain near the anus) TENDERNESS IN MOUTH AND THROAT WITH OR WITHOUT PRESENCE OF ULCERS (sore throat, sores in mouth, or a toothache) UNUSUAL RASH, SWELLING OR PAIN  UNUSUAL VAGINAL DISCHARGE OR ITCHING   Items with * indicate a potential emergency and should be followed up as soon as possible or go to the Emergency Department if any problems should occur.  Please show the CHEMOTHERAPY ALERT CARD or IMMUNOTHERAPY ALERT CARD at check-in to  the Emergency Department and triage nurse.  Should you have questions after your visit or need to cancel or reschedule your appointment, please contact [facility]  Dept: [phone]  and follow the prompts.  Office hours are 8:00 a.m. to 4:30 p.m. Monday - Friday. Please note that voicemails left after 4:00 p.m. may not be returned until the following business day.  We are closed weekends and major holidays. You have access to a nurse at all times for urgent questions. Please call the main number to the clinic Dept: [phone] and follow the prompts.   For any non-urgent questions, you may also contact your provider using MyChart. We now offer e-Visits for anyone 37 and older to request care online for non-urgent symptoms. For details visit [email or web address].   Also download the MyChart app! Go to the app store, search "MyChart", open the app, select [facility], and log in with your MyChart username and password.  Masks are optional in the cancer centers. If you would like for your care team to wear a mask while they are taking care of you, please let them know. You may have one support person who is at least 48 years old accompany you for your appointments.

## 2022-10-12 LAB — T4: T4, Total: 7.9 ug/dL (ref 4.5–12.0)

## 2022-10-15 ENCOUNTER — Telehealth: Payer: Self-pay | Admitting: Internal Medicine

## 2022-10-15 NOTE — Telephone Encounter (Signed)
Called patient regarding upcoming November - January appointments. Patient is notified.

## 2022-10-16 ENCOUNTER — Encounter: Payer: Self-pay | Admitting: Physician Assistant

## 2022-10-16 ENCOUNTER — Encounter: Payer: Self-pay | Admitting: Internal Medicine

## 2022-10-25 ENCOUNTER — Encounter: Payer: Self-pay | Admitting: Internal Medicine

## 2022-10-25 ENCOUNTER — Encounter: Payer: Self-pay | Admitting: Physician Assistant

## 2022-10-30 ENCOUNTER — Other Ambulatory Visit (HOSPITAL_COMMUNITY): Payer: Self-pay

## 2022-10-30 NOTE — Progress Notes (Unsigned)
[facility] OFFICE PROGRESS NOTE  [name], MD [place] [address]  DIAGNOSIS: Stage IV (T2b, N2, M1 B) non-small cell lung cancer, adenocarcinoma presented with large central left lower lobe lung mass with left hilar and mediastinal lymphadenopathy as well as abdominal retroperitoneal lymphadenopathy diagnosed in May 2022.  The patient also has bilateral parotid gland nodules that need close monitoring.   [facility] MOLECULAR STUDY: Results with Therapy Associations BIOMARKER METHOD ANALYTE RESULT THERAPY ASSOCIATION BIOMARKER LEVEL* .PD-L1 (22c3) IHC Protein Positive, TPS: 50% BENEFIT cemiplimab, pembrolizumab Level 1 .PD-L1 (28-8) IHC Protein Positive  1+, 50% BENEFIT nivolumab/ipilimumab combination Level 1 .TMB Seq DNA-Tumor High, 18 mut/Mb BENEFIT pembrolizumab Level 2 . alectinib, ceritinib, crizotinib, lorlatinib Level 1 . IHC Protein Negative  0 brigatinib Level 2 . ALK Seq RNA-Tumor Fusion Not Detected LACK OF BENEFIT alectinib, brigatinib, ceritinib, crizotinib, lorlatinib Level 2 .BRAF Seq DNA-Tumor Mutation Not Detected LACK OF BENEFIT dabrafenib and trametinib combination therapy, vemurafenib Level 2 .EGFR Seq DNA-Tumor Mutation Not Detected LACK OF BENEFIT erlotinib, gefitinib Level 2 .KRAS Seq DNA-Tumor Mutation Not Detected LACK OF BENEFIT sotorasib Level 2 .RET Seq RNA-Tumor Fusion Not Detected LACK OF BENEFIT pralsetinib, selpercatinib Level 2 .ROS1 Seq RNA-Tumor Fusion Not Detected LACK OF BENEFIT ceritinib, crizotinib, entrectinib, lorlatinib Level 2 . CNA-Seq DNA-Tumor Amplification Not Detected . MET Seq RNA-Tumor Variant Transcript Not Detected LACK OF BENEFIT crizotinib Level 3  PRIOR THERAPY:  Palliative radiotherapy to the large central left lower lobe lung mass under the care of Dr. Lisbeth Renshaw. Last treatment on 06/12/21.    CURRENT THERAPY: Systemic chemotherapy with carboplatin for AUC of 5, Alimta 500  Mg/M2 and Keytruda 200 Mg IV every 3 weeks.  First dose expected June 14, 2021.  Status post 24 cycles of treatment.  Starting from cycle #5, the patient will begin maintenance Keytruda and Alimta.  Alimta was reduced to 400 mg per metered squared starting from cycle number 23 due to fatigue.  Alimta discontinued starting from cycle #13 due to renal insufficiency     INTERVAL HISTORY: Cameron Harper 46 y.o. male returns to the clinic today for a follow-up visit.  The patient is feeling fairly well today without any concerning complaints. The patient is currently undergoing single agent immunotherapy with Keytruda.  Alimta was discontinued secondary to elevated creatinine and fatigue.  He is established  with a nephrologist at this time and they are monitoring him. The patient denies any recent fever, chills, or night sweats. Denies any worsening dyspnea. He played golf yesterday and reports he did not have any appreciable shortness of breath. Denies any chest pain, worsening cough, or hemoptysis.  Denies any nausea, vomiting, diarrhea, or constipation.  Denies any headache or visual changes. Denies any rashes.     He is here today for evaluation before starting cycle #25.  MEDICAL HISTORY: Past Medical History:  Diagnosis Date   Chronic back pain    Eczema    GERD (gastroesophageal reflux disease)    Lung cancer (HCC)     ALLERGIES:  is allergic to penicillins and zofran [ondansetron].  MEDICATIONS:  Current Outpatient Medications  Medication Sig Dispense Refill   acetaminophen (TYLENOL) 500 MG tablet Take 500-1,000 mg by mouth every 6 (six) hours as needed (for back pain.). (Patient not taking: Reported on 05/17/2022)     ferrous sulfate 325 (65 FE) MG tablet Take 325 mg by mouth daily with breakfast.     folic acid (FOLVITE) 1 MG tablet Take 1  tablet (1 mg total) by mouth daily. 30 tablet 4   lidocaine-prilocaine (EMLA) cream Apply 1 application topically as needed. (Patient not taking:  Reported on 10/11/2022) 30 g 2   prochlorperazine (COMPAZINE) 10 MG tablet Take 1 tablet (10 mg total) by mouth every 6 (six) hours as needed for nausea or vomiting. (Patient not taking: Reported on 05/17/2022) 30 tablet 0   No current facility-administered medications for this visit.    SURGICAL HISTORY:  Past Surgical History:  Procedure Laterality Date   BRONCHIAL BRUSHINGS  05/12/2021   Procedure: BRONCHIAL BRUSHINGS;  Surgeon: Garner Nash, DO;  Location: [facility];  Service: Pulmonary;;   BRONCHIAL DILITATION  05/12/2021   Procedure: BRONCHIAL DILITATION;  Surgeon: Garner Nash, DO;  Location: [facility];  Service: Pulmonary;;   BRONCHIAL NEEDLE ASPIRATION BIOPSY  05/12/2021   Procedure: BRONCHIAL NEEDLE ASPIRATION BIOPSIES;  Surgeon: Garner Nash, DO;  Location: [facility];  Service: Pulmonary;;   BRONCHIAL WASHINGS  05/12/2021   Procedure: BRONCHIAL WASHINGS;  Surgeon: Garner Nash, DO;  Location: [facility];  Service: Pulmonary;;   CRYOTHERAPY  05/12/2021   Procedure: CRYOTHERAPY;  Surgeon: Garner Nash, DO;  Location: [facility];  Service: Pulmonary;;   HEMOSTASIS CONTROL  05/12/2021   Procedure: HEMOSTASIS CONTROL;  Surgeon: Garner Nash, DO;  Location: [facility];  Service: Pulmonary;;  epi injection   IR IMAGING GUIDED PORT INSERTION  06/29/2021   Spine injection     Pain control   VIDEO BRONCHOSCOPY WITH ENDOBRONCHIAL ULTRASOUND N/A 05/12/2021   Procedure: VIDEO BRONCHOSCOPY WITH ENDOBRONCHIAL ULTRASOUND;  Surgeon: Garner Nash, DO;  Location: [facility];  Service: Pulmonary;  Laterality: N/A;   WISDOM TOOTH EXTRACTION     no anesthesia involed    REVIEW OF SYSTEMS:   Review of Systems  Constitutional: Negative for appetite change, chills, fatigue, fever and unexpected weight change.  HENT:   Negative for mouth sores, nosebleeds, sore throat and trouble swallowing.   Eyes: Negative for eye problems and icterus.  Respiratory: Negative  for cough, hemoptysis, shortness of breath and wheezing.   Cardiovascular: Negative for chest pain and leg swelling.  Gastrointestinal: Negative for abdominal pain, constipation, diarrhea, nausea and vomiting.  Genitourinary: Negative for bladder incontinence, difficulty urinating, dysuria, frequency and hematuria.   Musculoskeletal: Negative for back pain, gait problem, neck pain and neck stiffness.  Skin: Negative for itching and rash.  Neurological: Negative for dizziness, extremity weakness, gait problem, headaches, light-headedness and seizures.  Hematological: Negative for adenopathy. Does not bruise/bleed easily.  Psychiatric/Behavioral: Negative for confusion, depression and sleep disturbance. The patient is not nervous/anxious.     PHYSICAL EXAMINATION:  There were no vitals taken for this visit.  ECOG PERFORMANCE STATUS: {CHL ONC ECOG [id number]  Physical Exam  Constitutional: Oriented to person, place, and time and well-developed, well-nourished, and in no distress. No distress.  HENT:  Head: Normocephalic and atraumatic.  Mouth/Throat: Oropharynx is clear and moist. No oropharyngeal exudate.  Eyes: Conjunctivae are normal. Right eye exhibits no discharge. Left eye exhibits no discharge. No scleral icterus.  Neck: Normal range of motion. Neck supple.  Cardiovascular: Normal rate, regular rhythm, normal heart sounds and intact distal pulses.   Pulmonary/Chest: Effort normal and breath sounds normal. No respiratory distress. No wheezes. No rales.  Abdominal: Soft. Bowel sounds are normal. Exhibits no distension and no mass. There is no tenderness.  Musculoskeletal: Normal range of motion. Exhibits no edema.  Lymphadenopathy:    No cervical adenopathy.  Neurological:  Alert and oriented to person, place, and time. Exhibits normal muscle tone. Gait normal. Coordination normal.  Skin: Skin is warm and dry. No rash noted. Not diaphoretic. No erythema. No pallor.  Psychiatric:  Mood, memory and judgment normal.  Vitals reviewed.  LABORATORY DATA: Lab Results  Component Value Date   WBC 7.0 10/11/2022   HGB 11.4 (L) 10/11/2022   HCT 35.4 (L) 10/11/2022   MCV 89.2 10/11/2022   PLT 286 10/11/2022      Chemistry      Component Value Date/Time   NA 140 10/11/2022 0957   K 4.3 10/11/2022 0957   CL 108 10/11/2022 0957   CO2 27 10/11/2022 0957   BUN 17 10/11/2022 0957   CREATININE 1.34 (H) 10/11/2022 0957      Component Value Date/Time   CALCIUM 9.1 10/11/2022 0957   ALKPHOS 115 10/11/2022 0957   AST 11 (L) 10/11/2022 0957   ALT 11 10/11/2022 0957   BILITOT 0.3 10/11/2022 0957       RADIOGRAPHIC STUDIES:  No results found.   ASSESSMENT/PLAN:  This is a very pleasant 46 year old Caucasian male diagnosed with stage IV (T2b, N2, M1 B) non-small cell lung cancer, adenocarcinoma.  He presented with a large central left lower lobe lung mass in addition to left hilar mediastinal lymphadenopathy.  He also has retroperitoneal abdominal lymphadenopathy.  He was diagnosed in May 2022.  His PD-L1 expression is 50%.  His molecular studies by [facility] did not show any evidence of any actionable mutations.  He underwent palliative radiotherapy to the large left lower lobe lung mass under the care of Dr. Lisbeth Renshaw.  He completed this on 06/12/2021.    The patient is currently undergoing systemic chemotherapy with carboplatin for an AUC of 5, Alimta 500 mg per metered squared, and Keytruda 200 mg IV every 3 weeks.  He is status post 24 cycles.  Starting from cycle #5, the patient has been on maintenance Alimta and Keytruda.  The dose of Alimta was reduced to 400 mg per metered squared starting from cycle #12 due to fatigue.  Alimta was removed from the treatment plan starting from cycle #13 due to renal insufficiency.  Labs were reviewed.  Recommend that he proceed with cycle #25 today scheduled.   We will see him back for follow-up visit in 3 weeks for evaluation and to  review his scan before starting cycle #26.  I will arrange for a restaging CT scan prior to his next appointment.   The patient was advised to call immediately if she has any concerning symptoms in the interval. The patient voices understanding of current disease status and treatment options and is in agreement with the current care plan. All questions were answered. The patient knows to call the clinic with any problems, questions or concerns. We can certainly see the patient much sooner if necessary        No orders of the defined types were placed in this encounter.    I spent {CHL ONC TIME VISIT - ZOXWR:[id number]} counseling the patient face to face. The total time spent in the appointment was {CHL ONC TIME VISIT - BJYNW:[id number]}.  Darby Shadwick L Shar Paez, PA-C 10/30/22

## 2022-11-01 ENCOUNTER — Ambulatory Visit: Payer: 59

## 2022-11-01 ENCOUNTER — Ambulatory Visit: Payer: 59 | Admitting: Internal Medicine

## 2022-11-01 ENCOUNTER — Other Ambulatory Visit: Payer: 59

## 2022-11-01 ENCOUNTER — Other Ambulatory Visit: Payer: Self-pay

## 2022-11-01 ENCOUNTER — Inpatient Hospital Stay: Payer: 59

## 2022-11-01 ENCOUNTER — Inpatient Hospital Stay (HOSPITAL_BASED_OUTPATIENT_CLINIC_OR_DEPARTMENT_OTHER): Payer: 59 | Admitting: Physician Assistant

## 2022-11-01 ENCOUNTER — Ambulatory Visit (HOSPITAL_COMMUNITY)
Admission: RE | Admit: 2022-11-01 | Discharge: 2022-11-01 | Disposition: A | Discharge status: Home / Self Care | Payer: 59 | Source: Ambulatory Visit | Attending: Physician Assistant | Admitting: Physician Assistant

## 2022-11-01 VITALS — BP 126/77 | HR 78 | Temp 98.0°F | Resp 15 | Wt 186.1 lb

## 2022-11-01 VITALS — BP 115/69 | HR 70 | Resp 17

## 2022-11-01 DIAGNOSIS — M79661 Pain in right lower leg: Secondary | ICD-10-CM

## 2022-11-01 DIAGNOSIS — C3432 Malignant neoplasm of lower lobe, left bronchus or lung: Secondary | ICD-10-CM | POA: Diagnosis not present

## 2022-11-01 DIAGNOSIS — Z5112 Encounter for antineoplastic immunotherapy: Secondary | ICD-10-CM

## 2022-11-01 DIAGNOSIS — Z95828 Presence of other vascular implants and grafts: Secondary | ICD-10-CM

## 2022-11-01 LAB — CBC WITH DIFFERENTIAL (CANCER CENTER ONLY)
Abs Immature Granulocytes: 0.03 10*3/uL (ref 0.00–0.07)
Basophils Absolute: 0.1 10*3/uL (ref 0.0–0.1)
Basophils Relative: 1 %
Eosinophils Absolute: 0.3 10*3/uL (ref 0.0–0.5)
Eosinophils Relative: 4 %
HCT: 33.9 % — ABNORMAL LOW (ref 39.0–52.0)
Hemoglobin: 11 g/dL — ABNORMAL LOW (ref 13.0–17.0)
Immature Granulocytes: 0 %
Lymphocytes Relative: 11 %
Lymphs Abs: 0.8 10*3/uL (ref 0.7–4.0)
MCH: 28.9 pg (ref 26.0–34.0)
MCHC: 32.4 g/dL (ref 30.0–36.0)
MCV: 89 fL (ref 80.0–100.0)
Monocytes Absolute: 0.7 10*3/uL (ref 0.1–1.0)
Monocytes Relative: 10 %
Neutro Abs: 5.2 10*3/uL (ref 1.7–7.7)
Neutrophils Relative %: 74 %
Platelet Count: 277 10*3/uL (ref 150–400)
RBC: 3.81 MIL/uL — ABNORMAL LOW (ref 4.22–5.81)
RDW: 14.1 % (ref 11.5–15.5)
WBC Count: 7 10*3/uL (ref 4.0–10.5)
nRBC: 0 % (ref 0.0–0.2)

## 2022-11-01 LAB — CMP (CANCER CENTER ONLY)
ALT: 10 U/L (ref 0–44)
AST: 9 U/L — ABNORMAL LOW (ref 15–41)
Albumin: 3.7 g/dL (ref 3.5–5.0)
Alkaline Phosphatase: 121 U/L (ref 38–126)
Anion gap: 6 (ref 5–15)
BUN: 23 mg/dL — ABNORMAL HIGH (ref 6–20)
CO2: 28 mmol/L (ref 22–32)
Calcium: 9.2 mg/dL (ref 8.9–10.3)
Chloride: 106 mmol/L (ref 98–111)
Creatinine: 1.32 mg/dL — ABNORMAL HIGH (ref 0.61–1.24)
GFR, Estimated: >60 mL/min (ref >60)
Glucose, Bld: 131 mg/dL — ABNORMAL HIGH (ref 70–99)
Potassium: 3.9 mmol/L (ref 3.5–5.1)
Sodium: 140 mmol/L (ref 135–145)
Total Bilirubin: 0.3 mg/dL (ref 0.3–1.2)
Total Protein: 7.2 g/dL (ref 6.5–8.1)

## 2022-11-01 MED ORDER — SODIUM CHLORIDE 0.9 % IV SOLN
200.0000 mg | Freq: Once | INTRAVENOUS | Status: AC
  Administered 2022-11-01: 200 mg via INTRAVENOUS
  Filled 2022-11-01: qty 200

## 2022-11-01 MED ORDER — HEPARIN SOD (PORK) LOCK FLUSH 100 UNIT/ML IV SOLN
500.0000 [IU] | Freq: Once | INTRAVENOUS | Status: AC | PRN
  Administered 2022-11-01: 500 [IU]

## 2022-11-01 MED ORDER — SODIUM CHLORIDE 0.9 % IV SOLN: Freq: Once | INTRAVENOUS | Status: AC

## 2022-11-01 MED ORDER — SODIUM CHLORIDE 0.9% FLUSH
10.0000 mL | INTRAVENOUS | Status: DC | PRN
  Administered 2022-11-01: 10 mL

## 2022-11-01 MED ORDER — SODIUM CHLORIDE 0.9% FLUSH
10.0000 mL | Freq: Once | INTRAVENOUS | Status: AC
  Administered 2022-11-01: 10 mL

## 2022-11-01 NOTE — Patient Instructions (Signed)

## 2022-11-01 NOTE — Patient Instructions (Signed)

## 2022-11-02 ENCOUNTER — Other Ambulatory Visit: Payer: Self-pay

## 2022-11-02 ENCOUNTER — Encounter: Payer: Self-pay | Admitting: Physician Assistant

## 2022-11-05 ENCOUNTER — Other Ambulatory Visit: Payer: Self-pay

## 2022-11-15 ENCOUNTER — Encounter: Payer: Self-pay | Admitting: Internal Medicine

## 2022-11-15 ENCOUNTER — Encounter: Payer: Self-pay | Admitting: Physician Assistant

## 2022-11-17 ENCOUNTER — Encounter: Payer: Self-pay | Admitting: Physician Assistant

## 2022-11-17 ENCOUNTER — Encounter: Payer: Self-pay | Admitting: Internal Medicine

## 2022-11-19 ENCOUNTER — Encounter (HOSPITAL_COMMUNITY): Payer: Self-pay

## 2022-11-19 ENCOUNTER — Ambulatory Visit (HOSPITAL_COMMUNITY)
Admission: RE | Admit: 2022-11-19 | Discharge: 2022-11-19 | Disposition: A | Discharge status: Home / Self Care | Payer: 59 | Source: Ambulatory Visit | Attending: Physician Assistant | Admitting: Physician Assistant

## 2022-11-19 DIAGNOSIS — C3432 Malignant neoplasm of lower lobe, left bronchus or lung: Secondary | ICD-10-CM

## 2022-11-20 NOTE — Progress Notes (Unsigned)
[facility] OFFICE PROGRESS NOTE  [name], MD [place] [address]  DIAGNOSIS: Stage IV (T2b, N2, M1 B) non-small cell lung cancer, adenocarcinoma presented with large central left lower lobe lung mass with left hilar and mediastinal lymphadenopathy as well as abdominal retroperitoneal lymphadenopathy diagnosed in May 2022.  The patient also has bilateral parotid gland nodules that need close monitoring.   [facility] MOLECULAR STUDY: Results with Therapy Associations BIOMARKER METHOD ANALYTE RESULT THERAPY ASSOCIATION BIOMARKER LEVEL* .PD-L1 (22c3) IHC Protein Positive, TPS: 50% BENEFIT cemiplimab, pembrolizumab Level 1 .PD-L1 (28-8) IHC Protein Positive  1+, 50% BENEFIT nivolumab/ipilimumab combination Level 1 .TMB Seq DNA-Tumor High, 18 mut/Mb BENEFIT pembrolizumab Level 2 . alectinib, ceritinib, crizotinib, lorlatinib Level 1 . IHC Protein Negative  0 brigatinib Level 2 . ALK Seq RNA-Tumor Fusion Not Detected LACK OF BENEFIT alectinib, brigatinib, ceritinib, crizotinib, lorlatinib Level 2 .BRAF Seq DNA-Tumor Mutation Not Detected LACK OF BENEFIT dabrafenib and trametinib combination therapy, vemurafenib Level 2 .EGFR Seq DNA-Tumor Mutation Not Detected LACK OF BENEFIT erlotinib, gefitinib Level 2 .KRAS Seq DNA-Tumor Mutation Not Detected LACK OF BENEFIT sotorasib Level 2 .RET Seq RNA-Tumor Fusion Not Detected LACK OF BENEFIT pralsetinib, selpercatinib Level 2 .ROS1 Seq RNA-Tumor Fusion Not Detected LACK OF BENEFIT ceritinib, crizotinib, entrectinib, lorlatinib Level 2 . CNA-Seq DNA-Tumor Amplification Not Detected . MET Seq RNA-Tumor Variant Transcript Not Detected LACK OF BENEFIT crizotinib Level 3   PRIOR THERAPY: Palliative radiotherapy to the large central left lower lobe lung mass under the care of Dr. Lisbeth Renshaw. Last treatment on 06/12/21.    CURRENT THERAPY: Systemic chemotherapy with carboplatin for AUC of 5, Alimta 500  Mg/M2 and Keytruda 200 Mg IV every 3 weeks.  First dose expected June 14, 2021.  Status post 26 cycles of treatment.  Starting from cycle #5, the patient will begin maintenance Keytruda and Alimta.  Alimta was reduced to 400 mg per metered squared starting from cycle number 23 due to fatigue.  Alimta discontinued starting from cycle #13 due to renal insufficiency    INTERVAL HISTORY: Cameron Harper 46 y.o. male returns to the clinic today for a follow-up visit.  The patient is feeling fairly well today without any concerning complaints. At his last appointment, he was endorsing right shin pain which has resolved. At his last appointment, he was wondering if he needs to take an iron supplement. The patient is currently undergoing single agent immunotherapy with Keytruda.  Alimta was discontinued secondary to elevated creatinine and fatigue.  He is established  with a nephrologist at this time and they are monitoring him. He denies any recent infections. He has been a little more congested over the last few days. Denies any sick contacts. The patient denies any recent fever, chills, or night sweats. Denies any worsening dyspnea. Denies any chest pain,  or hemoptysis. He only has a mild cough.  Denies any nausea, vomiting, diarrhea, or constipation.  Denies any headache or visual changes. He has a history of eczema and the treatment does exacerbate his eczema. He typically uses Eucerin but it has not been helping. His daughter also has eczema and has kenalog cream which he has been using and it has helped him. He recently had a restaging CT scan. He is here today for evaluation and to review his scan before starting cycle #26   MEDICAL HISTORY: Past Medical History:  Diagnosis Date   Chronic back pain    Eczema    GERD (gastroesophageal reflux disease)  Lung cancer ([facility])     ALLERGIES:  is allergic to penicillins and zofran [ondansetron].  MEDICATIONS:  Current Outpatient Medications   Medication Sig Dispense Refill   acetaminophen (TYLENOL) 500 MG tablet Take 500-1,000 mg by mouth every 6 (six) hours as needed (for back pain.). (Patient not taking: Reported on 05/17/2022)     ferrous sulfate 325 (65 FE) MG tablet Take 325 mg by mouth daily with breakfast.     lidocaine-prilocaine (EMLA) cream Apply 1 application topically as needed. (Patient not taking: Reported on 10/11/2022) 30 g 2   prochlorperazine (COMPAZINE) 10 MG tablet Take 1 tablet (10 mg total) by mouth every 6 (six) hours as needed for nausea or vomiting. (Patient not taking: Reported on 05/17/2022) 30 tablet 0   No current facility-administered medications for this visit.    SURGICAL HISTORY:  Past Surgical History:  Procedure Laterality Date   BRONCHIAL BRUSHINGS  05/12/2021   Procedure: BRONCHIAL BRUSHINGS;  Surgeon: Garner Nash, DO;  Location: [facility];  Service: Pulmonary;;   BRONCHIAL DILITATION  05/12/2021   Procedure: BRONCHIAL DILITATION;  Surgeon: Garner Nash, DO;  Location: [facility];  Service: Pulmonary;;   BRONCHIAL NEEDLE ASPIRATION BIOPSY  05/12/2021   Procedure: BRONCHIAL NEEDLE ASPIRATION BIOPSIES;  Surgeon: Garner Nash, DO;  Location: [facility];  Service: Pulmonary;;   BRONCHIAL WASHINGS  05/12/2021   Procedure: BRONCHIAL WASHINGS;  Surgeon: Garner Nash, DO;  Location: [facility];  Service: Pulmonary;;   CRYOTHERAPY  05/12/2021   Procedure: CRYOTHERAPY;  Surgeon: Garner Nash, DO;  Location: [facility];  Service: Pulmonary;;   HEMOSTASIS CONTROL  05/12/2021   Procedure: HEMOSTASIS CONTROL;  Surgeon: Garner Nash, DO;  Location: [facility];  Service: Pulmonary;;  epi injection   IR IMAGING GUIDED PORT INSERTION  06/29/2021   Spine injection     Pain control   VIDEO BRONCHOSCOPY WITH ENDOBRONCHIAL ULTRASOUND N/A 05/12/2021   Procedure: VIDEO BRONCHOSCOPY WITH ENDOBRONCHIAL ULTRASOUND;  Surgeon: Garner Nash, DO;  Location: [facility];  Service:  Pulmonary;  Laterality: N/A;   WISDOM TOOTH EXTRACTION     no anesthesia involed    REVIEW OF SYSTEMS:   Constitutional: Negative for appetite change, chills, fever and unexpected weight change.  HENT: Negative for mouth sores, nosebleeds, sore throat and trouble swallowing.   Eyes: Negative for eye problems and icterus.  Respiratory: Positive for shortness of breath with strenuous activity. Negative for cough, hemoptysis, and wheezing.   Cardiovascular: Negative for chest pain and leg swelling.  Gastrointestinal: Negative for abdominal pain, constipation, diarrhea, nausea and vomiting.  Genitourinary: Negative for bladder incontinence, difficulty urinating, dysuria, frequency and hematuria.   Musculoskeletal: Negative for back pain, gait problem, neck pain and neck stiffness.  Skin: Positive for worsening eczema.  Neurological: Negative for dizziness, extremity weakness, gait problem, headaches, light-headedness and seizures.  Hematological: Negative for adenopathy. Does not bruise/bleed easily.  Psychiatric/Behavioral: Negative for confusion, depression and sleep disturbance. The patient is not nervous/anxious   PHYSICAL EXAMINATION:  There were no vitals taken for this visit.  ECOG PERFORMANCE STATUS: 1  Physical Exam  Constitutional: Oriented to person, place, and time and well-developed, well-nourished, and in no distress.  HENT:  Head: Normocephalic and atraumatic.  Mouth/Throat: Oropharynx is clear and moist. No oropharyngeal exudate.  Eyes: Conjunctivae are normal. Right eye exhibits no discharge. Left eye exhibits no discharge. No scleral icterus.  Neck: Normal range of motion. Neck supple.  Cardiovascular: Normal rate, regular rhythm, normal heart sounds and  intact distal pulses.   Pulmonary/Chest: Effort normal and breath sounds normal except diminished breath sounds at base of left lung. No respiratory distress. No wheezes. No rales.  Abdominal: Soft. Bowel sounds are  normal. Exhibits no distension and no mass. There is no tenderness.  Musculoskeletal: Normal range of motion. Exhibits no edema. No overlying skin changes.  Lymphadenopathy:    No cervical adenopathy.  Neurological: Alert and oriented to person, place, and time. Exhibits normal muscle tone. Gait normal. Coordination normal.  Skin: Skin is warm and dry. No rash noted. Not diaphoretic. No erythema. No pallor.  Psychiatric: Mood, memory and judgment normal.  Vitals reviewed.  LABORATORY DATA: Lab Results  Component Value Date   WBC 7.0 11/01/2022   HGB 11.0 (L) 11/01/2022   HCT 33.9 (L) 11/01/2022   MCV 89.0 11/01/2022   PLT 277 11/01/2022      Chemistry      Component Value Date/Time   NA 140 11/01/2022 1244   K 3.9 11/01/2022 1244   CL 106 11/01/2022 1244   CO2 28 11/01/2022 1244   BUN 23 (H) 11/01/2022 1244   CREATININE 1.32 (H) 11/01/2022 1244      Component Value Date/Time   CALCIUM 9.2 11/01/2022 1244   ALKPHOS 121 11/01/2022 1244   AST 9 (L) 11/01/2022 1244   ALT 10 11/01/2022 1244   BILITOT 0.3 11/01/2022 1244       RADIOGRAPHIC STUDIES:  CT Chest Wo Contrast  Result Date: 11/19/2022 CLINICAL DATA:  Non-small cell lung cancer, assess treatment response. EXAM: CT CHEST, ABDOMEN AND PELVIS WITHOUT CONTRAST TECHNIQUE: Multidetector CT imaging of the chest, abdomen and pelvis was performed following the standard protocol without IV contrast. RADIATION DOSE REDUCTION: This exam was performed according to the departmental dose-optimization program which includes automated exposure control, adjustment of the mA and/or kV according to patient size and/or use of iterative reconstruction technique. COMPARISON:  Previous imaging from September 2 05/22/2022 FINDINGS: CT CHEST FINDINGS Cardiovascular: RIGHT-sided Port-A-Cath terminates at the low superior vena cava. Heart size stable without pericardial effusion or nodularity. Central pulmonary vasculature is unchanged. Scattered  aortic atherosclerotic plaque with calcification. Limited assessment of cardiovascular structures given lack of intravenous contrast. Mediastinum/Nodes: No thoracic inlet, axillary, mediastinal or hilar adenopathy. Esophagus grossly normal. Distortion of LEFT hilum as on previous imaging. Lungs/Pleura: LEFT juxta hilar scarring. Moderate sub pulmonic effusion in the LEFT chest. These changes are stable. (Image 106/4) 4 mm RIGHT lower lobe pulmonary nodule new since previous imaging. Background pulmonary emphysema similar to prior imaging. Subtle nodularity at the RIGHT lung base (image 131/4) in the RIGHT lower lobe measuring 4 mm as well. Suggestion of tree in bud nodularity dependently in the RIGHT chest. Very mild and slightly increased. Musculoskeletal: No chest wall mass. See below for full musculoskeletal details. CT ABDOMEN PELVIS FINDINGS Hepatobiliary: Smooth hepatic contours. No pericholecystic stranding. No gross biliary duct distension. No visible lesion on noncontrast imaging. Pancreas: Pancreas with normal contours, no signs of inflammation or peripancreatic fluid. Spleen: Normal. Adrenals/Urinary Tract: Adrenal glands are normal. No hydronephrosis. Chronic perinephric stranding is stable. Stable Bosniak category II cyst arising from upper pole LEFT kidney medially 2.3 cm. No additional dedicated imaging follow-up recommended for this finding. Urinary bladder decompressed limiting assessment. Stomach/Bowel: No acute gastrointestinal findings. Normal appendix. Colonic diverticulosis. Vascular/Lymphatic: Aortic atherosclerosis. No sign of aneurysm. Smooth contour of the IVC. There is no gastrohepatic or hepatoduodenal ligament lymphadenopathy. No retroperitoneal or mesenteric lymphadenopathy. No pelvic sidewall lymphadenopathy. Atherosclerotic changes are mild.  Reproductive: Unremarkable by CT. Other: No ascites Musculoskeletal: No acute bone finding. No destructive bone process. Spinal degenerative  changes. IMPRESSION: 1. Stable post treatment changes in the LEFT chest with moderate sub pulmonic effusion in the LEFT chest. 2. Small nodules in the RIGHT lower lobe are new since previous imaging. There background changes that raise the question of mild bronchiolitis. Consider close attention on follow-up with short interval follow-up evaluation. 3. No signs of disease in the abdomen or pelvis 4. Colonic diverticulosis without evidence of acute diverticulitis. 5. Aortic atherosclerosis. 6. And aortic atherosclerosis. 7.  Pulmonary emphysema Aortic Atherosclerosis (ICD10-I70.0) and Emphysema (ICD10-J43.9). Electronically Signed   By: Zetta Bills M.D.   On: 11/19/2022 13:29   CT Abdomen Pelvis Wo Contrast  Result Date: 11/19/2022 CLINICAL DATA:  Non-small cell lung cancer, assess treatment response. EXAM: CT CHEST, ABDOMEN AND PELVIS WITHOUT CONTRAST TECHNIQUE: Multidetector CT imaging of the chest, abdomen and pelvis was performed following the standard protocol without IV contrast. RADIATION DOSE REDUCTION: This exam was performed according to the departmental dose-optimization program which includes automated exposure control, adjustment of the mA and/or kV according to patient size and/or use of iterative reconstruction technique. COMPARISON:  Previous imaging from September 2 05/22/2022 FINDINGS: CT CHEST FINDINGS Cardiovascular: RIGHT-sided Port-A-Cath terminates at the low superior vena cava. Heart size stable without pericardial effusion or nodularity. Central pulmonary vasculature is unchanged. Scattered aortic atherosclerotic plaque with calcification. Limited assessment of cardiovascular structures given lack of intravenous contrast. Mediastinum/Nodes: No thoracic inlet, axillary, mediastinal or hilar adenopathy. Esophagus grossly normal. Distortion of LEFT hilum as on previous imaging. Lungs/Pleura: LEFT juxta hilar scarring. Moderate sub pulmonic effusion in the LEFT chest. These changes are  stable. (Image 106/4) 4 mm RIGHT lower lobe pulmonary nodule new since previous imaging. Background pulmonary emphysema similar to prior imaging. Subtle nodularity at the RIGHT lung base (image 131/4) in the RIGHT lower lobe measuring 4 mm as well. Suggestion of tree in bud nodularity dependently in the RIGHT chest. Very mild and slightly increased. Musculoskeletal: No chest wall mass. See below for full musculoskeletal details. CT ABDOMEN PELVIS FINDINGS Hepatobiliary: Smooth hepatic contours. No pericholecystic stranding. No gross biliary duct distension. No visible lesion on noncontrast imaging. Pancreas: Pancreas with normal contours, no signs of inflammation or peripancreatic fluid. Spleen: Normal. Adrenals/Urinary Tract: Adrenal glands are normal. No hydronephrosis. Chronic perinephric stranding is stable. Stable Bosniak category II cyst arising from upper pole LEFT kidney medially 2.3 cm. No additional dedicated imaging follow-up recommended for this finding. Urinary bladder decompressed limiting assessment. Stomach/Bowel: No acute gastrointestinal findings. Normal appendix. Colonic diverticulosis. Vascular/Lymphatic: Aortic atherosclerosis. No sign of aneurysm. Smooth contour of the IVC. There is no gastrohepatic or hepatoduodenal ligament lymphadenopathy. No retroperitoneal or mesenteric lymphadenopathy. No pelvic sidewall lymphadenopathy. Atherosclerotic changes are mild. Reproductive: Unremarkable by CT. Other: No ascites Musculoskeletal: No acute bone finding. No destructive bone process. Spinal degenerative changes. IMPRESSION: 1. Stable post treatment changes in the LEFT chest with moderate sub pulmonic effusion in the LEFT chest. 2. Small nodules in the RIGHT lower lobe are new since previous imaging. There background changes that raise the question of mild bronchiolitis. Consider close attention on follow-up with short interval follow-up evaluation. 3. No signs of disease in the abdomen or pelvis 4.  Colonic diverticulosis without evidence of acute diverticulitis. 5. Aortic atherosclerosis. 6. And aortic atherosclerosis. 7.  Pulmonary emphysema Aortic Atherosclerosis (ICD10-I70.0) and Emphysema (ICD10-J43.9). Electronically Signed   By: Zetta Bills M.D.   On: 11/19/2022 13:29  DG Tibia/Fibula Right  Result Date: 11/02/2022 CLINICAL DATA:  Right knee and shin pain. Stage IV lung cancer. No known bone metastases. EXAM: RIGHT TIBIA AND FIBULA - 2 VIEW COMPARISON:  None Available. FINDINGS: Normal bone mineralization. The knee and ankle joint spaces are appropriately aligned. Mild degenerative spurring at the lateral process of the talus just distal to the fibula. No acute fracture or dislocation. IMPRESSION: Mild degenerative spurring at the lateral process of the talus. Electronically Signed   By: Yvonne Kendall M.D.   On: 11/02/2022 18:21     ASSESSMENT/PLAN:  This is a very pleasant 46 year old Caucasian male diagnosed with stage IV (T2b, N2, M1 B) non-small cell lung cancer, adenocarcinoma.  He presented with a large central left lower lobe lung mass in addition to left hilar mediastinal lymphadenopathy.  He also has retroperitoneal abdominal lymphadenopathy.  He was diagnosed in May 2022.  His PD-L1 expression is 50%.  His molecular studies by [facility] did not show any evidence of any actionable mutations   He underwent palliative radiotherapy to the large left lower lobe lung mass under the care of Dr. Lisbeth Renshaw.  He completed this on 06/12/2021.    The patient is currently undergoing systemic chemotherapy with carboplatin for an AUC of 5, Alimta 500 mg per metered squared, and Keytruda 200 mg IV every 3 weeks.  He is status post 25 cycles.  Starting from cycle #5, the patient has been on maintenance Alimta and Keytruda.  The dose of Alimta was reduced to 400 mg per metered squared starting from cycle #12 due to fatigue.  Alimta was removed from the treatment plan starting from cycle #13 due to renal  insufficiency.  The patient recently had a restaging CT. Dr. Julien Nordmann personally and independently reviewed the scan and reviewed the results with the patient. The scan showed no evidence of disease progression except there are tiny small nodules which we will monitor on subsequent scans.   Labs were reviewed.  Recommend that he proceed with cycle #26 today scheduled.   We will see him back for follow-up visit in 3 weeks for evaluation and to review his scan before starting cycle #27.  His iron saturation is low end of normal. He may still benefit from taking this every other day.   I have sent a prescription for kenalog cream.   The patient was advised to call immediately if he has any concerning symptoms in the interval. The patient voices understanding of current disease status and treatment options and is in agreement with the current care plan. All questions were answered. The patient knows to call the clinic with any problems, questions or concerns. We can certainly see the patient much sooner if necessary           No orders of the defined types were placed in this encounter.    Malayjah Otoole L Orenthal Debski, PA-C 11/20/22  ADDENDUM: Hematology/Oncology Attending: I had a face-to-face encounter with the patient today.  I reviewed his record, lab, scan and recommended his care plan.  This is a very pleasant 46 years old white male with a stage IV non-small cell lung cancer, adenocarcinoma diagnosed in May 2022. Patient started induction systemic chemotherapy with carboplatin, Alimta and Keytruda followed by maintenance Alimta and Keytruda starting from cycle #5 but Alimta was discontinued starting cycle #13 secondary to renal insufficiency.  The patient is currently on treatment with single agent Keytruda every 3 weeks status post a total cycles of treatment of 20 cycles. He  has been tolerating this treatment well with no concerning adverse effects. He had repeat CT scan of the  chest, abdomen and pelvis performed recently.  I personally and independently reviewed the scan images and discussed the result and showed the images to the patient today. His scan showed no concerning findings for disease progression but there were small nodules in the right lower lobe that are new since the previous scan and suspicious to be inflammatory in origin and require close monitoring. I recommended for the patient to continue his current treatment with maintenance Keytruda and he will proceed with cycle #27 today as planned. I will see him back for follow-up visit in 3 weeks for evaluation before the next cycle of his treatment. The patient was advised to call immediately if he has any other concerning symptoms in the interval. The total time spent in the appointment was 30 minutes. Disclaimer: This note was dictated with voice recognition software. Similar sounding words can inadvertently be transcribed and may be missed upon review. Eilleen Kempf, MD

## 2022-11-22 ENCOUNTER — Other Ambulatory Visit: Payer: Self-pay | Admitting: Physician Assistant

## 2022-11-22 ENCOUNTER — Other Ambulatory Visit: Payer: Self-pay

## 2022-11-22 ENCOUNTER — Inpatient Hospital Stay: Payer: 59

## 2022-11-22 ENCOUNTER — Inpatient Hospital Stay (HOSPITAL_BASED_OUTPATIENT_CLINIC_OR_DEPARTMENT_OTHER): Payer: 59 | Admitting: Physician Assistant

## 2022-11-22 ENCOUNTER — Inpatient Hospital Stay: Payer: 59 | Attending: Physician Assistant

## 2022-11-22 VITALS — BP 133/75 | HR 68 | Temp 98.0°F | Resp 16 | Wt 187.1 lb

## 2022-11-22 DIAGNOSIS — Z79899 Other long term (current) drug therapy: Secondary | ICD-10-CM | POA: Diagnosis not present

## 2022-11-22 DIAGNOSIS — C3432 Malignant neoplasm of lower lobe, left bronchus or lung: Secondary | ICD-10-CM

## 2022-11-22 DIAGNOSIS — Z5112 Encounter for antineoplastic immunotherapy: Secondary | ICD-10-CM

## 2022-11-22 DIAGNOSIS — Z95828 Presence of other vascular implants and grafts: Secondary | ICD-10-CM

## 2022-11-22 LAB — CBC WITH DIFFERENTIAL (CANCER CENTER ONLY)
Abs Immature Granulocytes: 0.03 10*3/uL (ref 0.00–0.07)
Basophils Absolute: 0.1 10*3/uL (ref 0.0–0.1)
Basophils Relative: 1 %
Eosinophils Absolute: 0.3 10*3/uL (ref 0.0–0.5)
Eosinophils Relative: 5 %
HCT: 37.3 % — ABNORMAL LOW (ref 39.0–52.0)
Hemoglobin: 12 g/dL — ABNORMAL LOW (ref 13.0–17.0)
Immature Granulocytes: 0 %
Lymphocytes Relative: 11 %
Lymphs Abs: 0.8 10*3/uL (ref 0.7–4.0)
MCH: 28.4 pg (ref 26.0–34.0)
MCHC: 32.2 g/dL (ref 30.0–36.0)
MCV: 88.4 fL (ref 80.0–100.0)
Monocytes Absolute: 0.8 10*3/uL (ref 0.1–1.0)
Monocytes Relative: 11 %
Neutro Abs: 5.3 10*3/uL (ref 1.7–7.7)
Neutrophils Relative %: 72 %
Platelet Count: 272 10*3/uL (ref 150–400)
RBC: 4.22 MIL/uL (ref 4.22–5.81)
RDW: 14.3 % (ref 11.5–15.5)
WBC Count: 7.3 10*3/uL (ref 4.0–10.5)
nRBC: 0 % (ref 0.0–0.2)

## 2022-11-22 LAB — CMP (CANCER CENTER ONLY)
ALT: 12 U/L (ref 0–44)
AST: 12 U/L — ABNORMAL LOW (ref 15–41)
Albumin: 3.8 g/dL (ref 3.5–5.0)
Alkaline Phosphatase: 127 U/L — ABNORMAL HIGH (ref 38–126)
Anion gap: 4 — ABNORMAL LOW (ref 5–15)
BUN: 17 mg/dL (ref 6–20)
CO2: 29 mmol/L (ref 22–32)
Calcium: 9.2 mg/dL (ref 8.9–10.3)
Chloride: 108 mmol/L (ref 98–111)
Creatinine: 1.42 mg/dL — ABNORMAL HIGH (ref 0.61–1.24)
GFR, Estimated: >60 mL/min (ref >60)
Glucose, Bld: 93 mg/dL (ref 70–99)
Potassium: 4.2 mmol/L (ref 3.5–5.1)
Sodium: 141 mmol/L (ref 135–145)
Total Bilirubin: 0.3 mg/dL (ref 0.3–1.2)
Total Protein: 7.3 g/dL (ref 6.5–8.1)

## 2022-11-22 LAB — TSH: TSH: 2.566 u[IU]/mL (ref 0.350–4.500)

## 2022-11-22 LAB — IRON AND IRON BINDING CAPACITY (CC-WL,HP ONLY)
Iron: 57 ug/dL (ref 45–182)
Saturation Ratios: 17 % — ABNORMAL LOW (ref 17.9–39.5)
TIBC: 335 ug/dL (ref 250–450)
UIBC: 278 ug/dL (ref 117–376)

## 2022-11-22 LAB — FERRITIN: Ferritin: 210 ng/mL (ref 24–336)

## 2022-11-22 MED ORDER — SODIUM CHLORIDE 0.9 % IV SOLN
200.0000 mg | Freq: Once | INTRAVENOUS | Status: AC
  Administered 2022-11-22: 200 mg via INTRAVENOUS
  Filled 2022-11-22: qty 200

## 2022-11-22 MED ORDER — SODIUM CHLORIDE 0.9% FLUSH
10.0000 mL | Freq: Once | INTRAVENOUS | Status: AC
  Administered 2022-11-22: 10 mL

## 2022-11-22 MED ORDER — SODIUM CHLORIDE 0.9% FLUSH
10.0000 mL | INTRAVENOUS | Status: DC | PRN
  Administered 2022-11-22: 10 mL

## 2022-11-22 MED ORDER — HEPARIN SOD (PORK) LOCK FLUSH 100 UNIT/ML IV SOLN
500.0000 [IU] | Freq: Once | INTRAVENOUS | Status: AC | PRN
  Administered 2022-11-22: 500 [IU]

## 2022-11-22 MED ORDER — TRIAMCINOLONE ACETONIDE 0.1 % EX CREA: 1.0000 | TOPICAL_CREAM | Freq: Two times a day (BID) | CUTANEOUS | 0 refills | Status: DC | PRN

## 2022-11-22 MED ORDER — SODIUM CHLORIDE 0.9 % IV SOLN: Freq: Once | INTRAVENOUS | Status: AC

## 2022-11-22 NOTE — Patient Instructions (Signed)
[facility] ONCOLOGY  Discharge Instructions: Thank you for choosing [facility] to provide your oncology and hematology care.   If you have a lab appointment with the [facility], please go directly to the [facility] and check in at the registration area.   Wear comfortable clothing and clothing appropriate for easy access to any Portacath or PICC line.   We strive to give you quality time with your provider. You may need to reschedule your appointment if you arrive late (15 or more minutes).  Arriving late affects you and other patients whose appointments are after yours.  Also, if you miss three or more appointments without notifying the office, you may be dismissed from the clinic at the provider's discretion.      For prescription refill requests, have your pharmacy contact our office and allow 72 hours for refills to be completed.    Today you received the following chemotherapy and/or immunotherapy agents [name]      To help prevent nausea and vomiting after your treatment, we encourage you to take your nausea medication as directed.  BELOW ARE SYMPTOMS THAT SHOULD BE REPORTED IMMEDIATELY: *FEVER GREATER THAN 100.4 F (38 C) OR HIGHER *CHILLS OR SWEATING *NAUSEA AND VOMITING THAT IS NOT CONTROLLED WITH YOUR NAUSEA MEDICATION *UNUSUAL SHORTNESS OF BREATH *UNUSUAL BRUISING OR BLEEDING *URINARY PROBLEMS (pain or burning when urinating, or frequent urination) *BOWEL PROBLEMS (unusual diarrhea, constipation, pain near the anus) TENDERNESS IN MOUTH AND THROAT WITH OR WITHOUT PRESENCE OF ULCERS (sore throat, sores in mouth, or a toothache) UNUSUAL RASH, SWELLING OR PAIN  UNUSUAL VAGINAL DISCHARGE OR ITCHING   Items with * indicate a potential emergency and should be followed up as soon as possible or go to the Emergency Department if any problems should occur.  Please show the CHEMOTHERAPY ALERT CARD or IMMUNOTHERAPY ALERT CARD at check-in to  the Emergency Department and triage nurse.  Should you have questions after your visit or need to cancel or reschedule your appointment, please contact [facility]  Dept: [phone]  and follow the prompts.  Office hours are 8:00 a.m. to 4:30 p.m. Monday - Friday. Please note that voicemails left after 4:00 p.m. may not be returned until the following business day.  We are closed weekends and major holidays. You have access to a nurse at all times for urgent questions. Please call the main number to the clinic Dept: [phone] and follow the prompts.   For any non-urgent questions, you may also contact your provider using MyChart. We now offer e-Visits for anyone 26 and older to request care online for non-urgent symptoms. For details visit [email or web address].   Also download the MyChart app! Go to the app store, search "MyChart", open the app, select [facility], and log in with your MyChart username and password.  Masks are optional in the cancer centers. If you would like for your care team to wear a mask while they are taking care of you, please let them know. You may have one support person who is at least 48 years old accompany you for your appointments.

## 2022-12-06 ENCOUNTER — Encounter: Payer: Self-pay | Admitting: Physician Assistant

## 2022-12-06 ENCOUNTER — Encounter: Payer: Self-pay | Admitting: Internal Medicine

## 2022-12-11 ENCOUNTER — Telehealth: Payer: Self-pay

## 2022-12-11 NOTE — Telephone Encounter (Signed)
This nurse received a call from this patient wanting to know if it was ok for him to punch a boxing bag with his port.  This nurse spoke with provider who advised as long as the bag is not heavy it will be ok.  Patient states that he will more than likely not do boxing because it is really ineffective if its less than 60 lbs.  This nurse agreed and patient has no further questions or concerns.

## 2022-12-13 ENCOUNTER — Inpatient Hospital Stay: Payer: 59

## 2022-12-13 ENCOUNTER — Encounter: Payer: Self-pay | Admitting: Physician Assistant

## 2022-12-13 ENCOUNTER — Inpatient Hospital Stay: Payer: 59 | Attending: Physician Assistant | Admitting: Internal Medicine

## 2022-12-13 ENCOUNTER — Other Ambulatory Visit: Payer: Self-pay

## 2022-12-13 ENCOUNTER — Encounter: Payer: Self-pay | Admitting: Internal Medicine

## 2022-12-13 DIAGNOSIS — Z95828 Presence of other vascular implants and grafts: Secondary | ICD-10-CM

## 2022-12-13 DIAGNOSIS — C3432 Malignant neoplasm of lower lobe, left bronchus or lung: Secondary | ICD-10-CM | POA: Diagnosis not present

## 2022-12-13 DIAGNOSIS — Z5112 Encounter for antineoplastic immunotherapy: Secondary | ICD-10-CM | POA: Diagnosis not present

## 2022-12-13 LAB — CBC WITH DIFFERENTIAL (CANCER CENTER ONLY)
Abs Immature Granulocytes: 0.02 10*3/uL (ref 0.00–0.07)
Basophils Absolute: 0.1 10*3/uL (ref 0.0–0.1)
Basophils Relative: 1 %
Eosinophils Absolute: 0.3 10*3/uL (ref 0.0–0.5)
Eosinophils Relative: 4 %
HCT: 36.6 % — ABNORMAL LOW (ref 39.0–52.0)
Hemoglobin: 12.2 g/dL — ABNORMAL LOW (ref 13.0–17.0)
Immature Granulocytes: 0 %
Lymphocytes Relative: 15 %
Lymphs Abs: 1.1 10*3/uL (ref 0.7–4.0)
MCH: 29.2 pg (ref 26.0–34.0)
MCHC: 33.3 g/dL (ref 30.0–36.0)
MCV: 87.6 fL (ref 80.0–100.0)
Monocytes Absolute: 0.8 10*3/uL (ref 0.1–1.0)
Monocytes Relative: 11 %
Neutro Abs: 4.9 10*3/uL (ref 1.7–7.7)
Neutrophils Relative %: 69 %
Platelet Count: 276 10*3/uL (ref 150–400)
RBC: 4.18 MIL/uL — ABNORMAL LOW (ref 4.22–5.81)
RDW: 14.1 % (ref 11.5–15.5)
WBC Count: 7.2 10*3/uL (ref 4.0–10.5)
nRBC: 0 % (ref 0.0–0.2)

## 2022-12-13 LAB — CMP (CANCER CENTER ONLY)
ALT: 12 U/L (ref 0–44)
AST: 11 U/L — ABNORMAL LOW (ref 15–41)
Albumin: 3.9 g/dL (ref 3.5–5.0)
Alkaline Phosphatase: 109 U/L (ref 38–126)
Anion gap: 5 (ref 5–15)
BUN: 21 mg/dL — ABNORMAL HIGH (ref 6–20)
CO2: 27 mmol/L (ref 22–32)
Calcium: 9.3 mg/dL (ref 8.9–10.3)
Chloride: 107 mmol/L (ref 98–111)
Creatinine: 1.26 mg/dL — ABNORMAL HIGH (ref 0.61–1.24)
GFR, Estimated: >60 mL/min (ref >60)
Glucose, Bld: 76 mg/dL (ref 70–99)
Potassium: 4.4 mmol/L (ref 3.5–5.1)
Sodium: 139 mmol/L (ref 135–145)
Total Bilirubin: 0.2 mg/dL — ABNORMAL LOW (ref 0.3–1.2)
Total Protein: 7.3 g/dL (ref 6.5–8.1)

## 2022-12-13 MED ORDER — SODIUM CHLORIDE 0.9 % IV SOLN: Freq: Once | INTRAVENOUS | Status: AC

## 2022-12-13 MED ORDER — HEPARIN SOD (PORK) LOCK FLUSH 100 UNIT/ML IV SOLN
500.0000 [IU] | Freq: Once | INTRAVENOUS | Status: AC
  Administered 2022-12-13: 500 [IU]

## 2022-12-13 MED ORDER — SODIUM CHLORIDE 0.9% FLUSH
10.0000 mL | Freq: Once | INTRAVENOUS | Status: AC
  Administered 2022-12-13: 10 mL

## 2022-12-13 MED ORDER — SODIUM CHLORIDE 0.9 % IV SOLN
200.0000 mg | Freq: Once | INTRAVENOUS | Status: AC
  Administered 2022-12-13: 200 mg via INTRAVENOUS
  Filled 2022-12-13: qty 200

## 2022-12-13 NOTE — Progress Notes (Signed)
[facility] [phone]   [phone]  OFFICE PROGRESS NOTE  [name], MD [place] [address]  DIAGNOSIS:  Stage IV (T2b, N2, M1 B) non-small cell lung cancer, adenocarcinoma presented with large central left lower lobe lung mass with left hilar and mediastinal lymphadenopathy as well as abdominal retroperitoneal lymphadenopathy diagnosed in May 2022.  The patient also has bilateral parotid gland nodules that need close monitoring.  [facility] MOLECULAR STUDY: Results with Therapy Associations BIOMARKER METHOD ANALYTE RESULT THERAPY ASSOCIATION BIOMARKER LEVEL* .PD-L1 (22c3) IHC Protein Positive, TPS: 50% BENEFIT cemiplimab, pembrolizumab Level 1 .PD-L1 (28-8) IHC Protein Positive  1+, 50% BENEFIT nivolumab/ipilimumab combination Level 1 .TMB Seq DNA-Tumor High, 18 mut/Mb BENEFIT pembrolizumab Level 2 . alectinib, ceritinib, crizotinib, lorlatinib Level 1 . IHC Protein Negative  0 brigatinib Level 2 . ALK Seq RNA-Tumor Fusion Not Detected LACK OF BENEFIT alectinib, brigatinib, ceritinib, crizotinib, lorlatinib Level 2 .BRAF Seq DNA-Tumor Mutation Not Detected LACK OF BENEFIT dabrafenib and trametinib combination therapy, vemurafenib Level 2 .EGFR Seq DNA-Tumor Mutation Not Detected LACK OF BENEFIT erlotinib, gefitinib Level 2 .KRAS Seq DNA-Tumor Mutation Not Detected LACK OF BENEFIT sotorasib Level 2 .RET Seq RNA-Tumor Fusion Not Detected LACK OF BENEFIT pralsetinib, selpercatinib Level 2 .ROS1 Seq RNA-Tumor Fusion Not Detected LACK OF BENEFIT ceritinib, crizotinib, entrectinib, lorlatinib Level 2 . CNA-Seq DNA-Tumor Amplification Not Detected .MET Seq RNA-Tumor Variant Transcript Not Detected LACK OF BENEFIT crizotinib Level 3  PRIOR THERAPY: Palliative radiotherapy to the large central left lower lobe lung mass under the care of Dr. Lisbeth Renshaw.  CURRENT THERAPY: Systemic chemotherapy with carboplatin for AUC  of 5, Alimta 500 Mg/M2 and possibly Keytruda 200 Mg IV every 3 weeks.  First dose expected June 14, 2021.  Starting from cycle #5 the patient is on maintenance treatment with Alimta and Keytruda every 3 weeks.  Status post 22 cycles of treatment.  Starting from cycle #12 the dose of Alimta to 400 Mg/M2 because of his increasing fatigue and side effects.  Alimta was discontinued secondary to renal insufficiency.  INTERVAL HISTORY: Cameron Harper 47 y.o. male returns to the clinic today for follow-up visit.  The patient is feeling fine today with no concerning complaints except for mild shortness of breath with exertion but no significant chest pain, cough or hemoptysis.  He has no nausea, vomiting, diarrhea or constipation.  He has no headache or visual changes.  He has no recent weight loss or night sweats.  He has been tolerating his treatment with Keytruda fairly well.  He is here today for evaluation before starting cycle #27.   MEDICAL HISTORY: Past Medical History:  Diagnosis Date   Chronic back pain    Eczema    GERD (gastroesophageal reflux disease)    Lung cancer (HCC)     ALLERGIES:  is allergic to penicillins and zofran [ondansetron].  MEDICATIONS:  Current Outpatient Medications  Medication Sig Dispense Refill   acetaminophen (TYLENOL) 500 MG tablet Take 500-1,000 mg by mouth every 6 (six) hours as needed (for back pain.).     ferrous sulfate 325 (65 FE) MG tablet Take 325 mg by mouth daily with breakfast.     lidocaine-prilocaine (EMLA) cream Apply 1 application topically as needed. 30 g 2   prochlorperazine (COMPAZINE) 10 MG tablet Take 1 tablet (10 mg total) by mouth every 6 (six) hours as needed for nausea or vomiting. 30 tablet 0   triamcinolone cream (KENALOG) 0.1 % Apply 1 Application  topically 2 (two) times daily as needed. 30 g 0   No current facility-administered medications for this visit.    SURGICAL HISTORY:  Past Surgical History:  Procedure Laterality Date    BRONCHIAL BRUSHINGS  05/12/2021   Procedure: BRONCHIAL BRUSHINGS;  Surgeon: Garner Nash, DO;  Location: [facility];  Service: Pulmonary;;   BRONCHIAL DILITATION  05/12/2021   Procedure: BRONCHIAL DILITATION;  Surgeon: Garner Nash, DO;  Location: [facility];  Service: Pulmonary;;   BRONCHIAL NEEDLE ASPIRATION BIOPSY  05/12/2021   Procedure: BRONCHIAL NEEDLE ASPIRATION BIOPSIES;  Surgeon: Garner Nash, DO;  Location: [facility];  Service: Pulmonary;;   BRONCHIAL WASHINGS  05/12/2021   Procedure: BRONCHIAL WASHINGS;  Surgeon: Garner Nash, DO;  Location: [facility];  Service: Pulmonary;;   CRYOTHERAPY  05/12/2021   Procedure: CRYOTHERAPY;  Surgeon: Garner Nash, DO;  Location: [facility];  Service: Pulmonary;;   HEMOSTASIS CONTROL  05/12/2021   Procedure: HEMOSTASIS CONTROL;  Surgeon: Garner Nash, DO;  Location: [facility];  Service: Pulmonary;;  epi injection   IR IMAGING GUIDED PORT INSERTION  06/29/2021   Spine injection     Pain control   VIDEO BRONCHOSCOPY WITH ENDOBRONCHIAL ULTRASOUND N/A 05/12/2021   Procedure: VIDEO BRONCHOSCOPY WITH ENDOBRONCHIAL ULTRASOUND;  Surgeon: Garner Nash, DO;  Location: [facility];  Service: Pulmonary;  Laterality: N/A;   WISDOM TOOTH EXTRACTION     no anesthesia involed    REVIEW OF SYSTEMS:  A comprehensive review of systems was negative except for: Respiratory: positive for dyspnea on exertion   PHYSICAL EXAMINATION: General appearance: alert, cooperative, and no distress Head: Normocephalic, without obvious abnormality, atraumatic Neck: no adenopathy, no JVD, supple, symmetrical, trachea midline, and thyroid not enlarged, symmetric, no tenderness/mass/nodules Lymph nodes: Cervical, supraclavicular, and axillary nodes normal. Resp: clear to auscultation bilaterally Back: symmetric, no curvature. ROM normal. No CVA tenderness. Cardio: regular rate and rhythm, S1, S2 normal, no murmur, click, rub or gallop GI:  soft, non-tender; bowel sounds normal; no masses,  no organomegaly Extremities: extremities normal, atraumatic, no cyanosis or edema  ECOG PERFORMANCE STATUS: 0 - Asymptomatic  Blood pressure 115/71, pulse 70, temperature 98.1 F (36.7 C), temperature source Oral, resp. rate 16, weight 187 lb 11.2 oz (85.1 kg), SpO2 100 %.  LABORATORY DATA: Lab Results  Component Value Date   WBC 7.3 11/22/2022   HGB 12.0 (L) 11/22/2022   HCT 37.3 (L) 11/22/2022   MCV 88.4 11/22/2022   PLT 272 11/22/2022      Chemistry      Component Value Date/Time   NA 141 11/22/2022 1038   K 4.2 11/22/2022 1038   CL 108 11/22/2022 1038   CO2 29 11/22/2022 1038   BUN 17 11/22/2022 1038   CREATININE 1.42 (H) 11/22/2022 1038      Component Value Date/Time   CALCIUM 9.2 11/22/2022 1038   ALKPHOS 127 (H) 11/22/2022 1038   AST 12 (L) 11/22/2022 1038   ALT 12 11/22/2022 1038   BILITOT 0.3 11/22/2022 1038       RADIOGRAPHIC STUDIES: CT Chest Wo Contrast  Result Date: 11/19/2022 CLINICAL DATA:  Non-small cell lung cancer, assess treatment response. EXAM: CT CHEST, ABDOMEN AND PELVIS WITHOUT CONTRAST TECHNIQUE: Multidetector CT imaging of the chest, abdomen and pelvis was performed following the standard protocol without IV contrast. RADIATION DOSE REDUCTION: This exam was performed according to the departmental dose-optimization program which includes automated exposure control, adjustment of the mA and/or kV according to patient size and/or  use of iterative reconstruction technique. COMPARISON:  Previous imaging from September 2 05/22/2022 FINDINGS: CT CHEST FINDINGS Cardiovascular: RIGHT-sided Port-A-Cath terminates at the low superior vena cava. Heart size stable without pericardial effusion or nodularity. Central pulmonary vasculature is unchanged. Scattered aortic atherosclerotic plaque with calcification. Limited assessment of cardiovascular structures given lack of intravenous contrast. Mediastinum/Nodes:  No thoracic inlet, axillary, mediastinal or hilar adenopathy. Esophagus grossly normal. Distortion of LEFT hilum as on previous imaging. Lungs/Pleura: LEFT juxta hilar scarring. Moderate sub pulmonic effusion in the LEFT chest. These changes are stable. (Image 106/4) 4 mm RIGHT lower lobe pulmonary nodule new since previous imaging. Background pulmonary emphysema similar to prior imaging. Subtle nodularity at the RIGHT lung base (image 131/4) in the RIGHT lower lobe measuring 4 mm as well. Suggestion of tree in bud nodularity dependently in the RIGHT chest. Very mild and slightly increased. Musculoskeletal: No chest wall mass. See below for full musculoskeletal details. CT ABDOMEN PELVIS FINDINGS Hepatobiliary: Smooth hepatic contours. No pericholecystic stranding. No gross biliary duct distension. No visible lesion on noncontrast imaging. Pancreas: Pancreas with normal contours, no signs of inflammation or peripancreatic fluid. Spleen: Normal. Adrenals/Urinary Tract: Adrenal glands are normal. No hydronephrosis. Chronic perinephric stranding is stable. Stable Bosniak category II cyst arising from upper pole LEFT kidney medially 2.3 cm. No additional dedicated imaging follow-up recommended for this finding. Urinary bladder decompressed limiting assessment. Stomach/Bowel: No acute gastrointestinal findings. Normal appendix. Colonic diverticulosis. Vascular/Lymphatic: Aortic atherosclerosis. No sign of aneurysm. Smooth contour of the IVC. There is no gastrohepatic or hepatoduodenal ligament lymphadenopathy. No retroperitoneal or mesenteric lymphadenopathy. No pelvic sidewall lymphadenopathy. Atherosclerotic changes are mild. Reproductive: Unremarkable by CT. Other: No ascites Musculoskeletal: No acute bone finding. No destructive bone process. Spinal degenerative changes. IMPRESSION: 1. Stable post treatment changes in the LEFT chest with moderate sub pulmonic effusion in the LEFT chest. 2. Small nodules in the RIGHT  lower lobe are new since previous imaging. There background changes that raise the question of mild bronchiolitis. Consider close attention on follow-up with short interval follow-up evaluation. 3. No signs of disease in the abdomen or pelvis 4. Colonic diverticulosis without evidence of acute diverticulitis. 5. Aortic atherosclerosis. 6. And aortic atherosclerosis. 7.  Pulmonary emphysema Aortic Atherosclerosis (ICD10-I70.0) and Emphysema (ICD10-J43.9). Electronically Signed   By: Zetta Bills M.D.   On: 11/19/2022 13:29   CT Abdomen Pelvis Wo Contrast  Result Date: 11/19/2022 CLINICAL DATA:  Non-small cell lung cancer, assess treatment response. EXAM: CT CHEST, ABDOMEN AND PELVIS WITHOUT CONTRAST TECHNIQUE: Multidetector CT imaging of the chest, abdomen and pelvis was performed following the standard protocol without IV contrast. RADIATION DOSE REDUCTION: This exam was performed according to the departmental dose-optimization program which includes automated exposure control, adjustment of the mA and/or kV according to patient size and/or use of iterative reconstruction technique. COMPARISON:  Previous imaging from September 2 05/22/2022 FINDINGS: CT CHEST FINDINGS Cardiovascular: RIGHT-sided Port-A-Cath terminates at the low superior vena cava. Heart size stable without pericardial effusion or nodularity. Central pulmonary vasculature is unchanged. Scattered aortic atherosclerotic plaque with calcification. Limited assessment of cardiovascular structures given lack of intravenous contrast. Mediastinum/Nodes: No thoracic inlet, axillary, mediastinal or hilar adenopathy. Esophagus grossly normal. Distortion of LEFT hilum as on previous imaging. Lungs/Pleura: LEFT juxta hilar scarring. Moderate sub pulmonic effusion in the LEFT chest. These changes are stable. (Image 106/4) 4 mm RIGHT lower lobe pulmonary nodule new since previous imaging. Background pulmonary emphysema similar to prior imaging. Subtle  nodularity at the RIGHT lung base (image 131/4)  in the RIGHT lower lobe measuring 4 mm as well. Suggestion of tree in bud nodularity dependently in the RIGHT chest. Very mild and slightly increased. Musculoskeletal: No chest wall mass. See below for full musculoskeletal details. CT ABDOMEN PELVIS FINDINGS Hepatobiliary: Smooth hepatic contours. No pericholecystic stranding. No gross biliary duct distension. No visible lesion on noncontrast imaging. Pancreas: Pancreas with normal contours, no signs of inflammation or peripancreatic fluid. Spleen: Normal. Adrenals/Urinary Tract: Adrenal glands are normal. No hydronephrosis. Chronic perinephric stranding is stable. Stable Bosniak category II cyst arising from upper pole LEFT kidney medially 2.3 cm. No additional dedicated imaging follow-up recommended for this finding. Urinary bladder decompressed limiting assessment. Stomach/Bowel: No acute gastrointestinal findings. Normal appendix. Colonic diverticulosis. Vascular/Lymphatic: Aortic atherosclerosis. No sign of aneurysm. Smooth contour of the IVC. There is no gastrohepatic or hepatoduodenal ligament lymphadenopathy. No retroperitoneal or mesenteric lymphadenopathy. No pelvic sidewall lymphadenopathy. Atherosclerotic changes are mild. Reproductive: Unremarkable by CT. Other: No ascites Musculoskeletal: No acute bone finding. No destructive bone process. Spinal degenerative changes. IMPRESSION: 1. Stable post treatment changes in the LEFT chest with moderate sub pulmonic effusion in the LEFT chest. 2. Small nodules in the RIGHT lower lobe are new since previous imaging. There background changes that raise the question of mild bronchiolitis. Consider close attention on follow-up with short interval follow-up evaluation. 3. No signs of disease in the abdomen or pelvis 4. Colonic diverticulosis without evidence of acute diverticulitis. 5. Aortic atherosclerosis. 6. And aortic atherosclerosis. 7.  Pulmonary emphysema Aortic  Atherosclerosis (ICD10-I70.0) and Emphysema (ICD10-J43.9). Electronically Signed   By: Zetta Bills M.D.   On: 11/19/2022 13:29    ASSESSMENT AND PLAN: This is a very pleasant 47 years old white male recently diagnosed with a stage IV (T2b, N2, M1 B) non-small cell lung cancer, adenocarcinoma presented with large central left lower lobe lung mass in addition to left hilar and mediastinal lymphadenopathy as well as retroperitoneal abdominal lymph node diagnosed in May 2022.  The patient is currently undergoing palliative radiotherapy to the large left lower lobe lung mass under the care of of Dr. Lisbeth Renshaw.  He is expected to complete this course of treatment on June 12, 2021. MRI of the brain showed no evidence of metastatic disease to the brain. His molecular studies by [facility] showed no actionable mutations and PD-L1 expression of 50%. He is currently undergoing systemic chemotherapy with carboplatin for AUC of 5, Alimta 500 Mg/M2 and Keytruda 200 Mg IV every 3 weeks.  Starting from cycle #5 he is on maintenance treatment with Alimta at 400 Mg/M2 and Keytruda 200 Mg IV every 3 weeks status post 26 cycles.  He is currently on single agent Keytruda secondary to renal insufficiency. The patient has been tolerating this treatment well with no concerning adverse effects. I recommended for him to proceed with cycle #27 today as planned. I will see him back for follow-up visit in 3 weeks for evaluation before the next cycle of his treatment. For the renal insufficiency, he is currently followed by nephrology. For the anemia of chronic disease, he will continue with the oral iron supplements and we will continue to monitor it closely and consider him for transfusion if needed. He was advised to call immediately if he has any other concerning symptoms in the interval. The patient voices understanding of current disease status and treatment options and is in agreement with the current care plan. All questions were  answered. The patient knows to call the clinic with any problems, questions or concerns.  We can certainly see the patient much sooner if necessary.  Disclaimer: This note was dictated with voice recognition software. Similar sounding words can inadvertently be transcribed and may not be corrected upon review.

## 2022-12-14 ENCOUNTER — Other Ambulatory Visit: Payer: Self-pay | Admitting: Internal Medicine

## 2022-12-14 ENCOUNTER — Other Ambulatory Visit (HOSPITAL_COMMUNITY): Payer: Self-pay

## 2022-12-14 DIAGNOSIS — C3432 Malignant neoplasm of lower lobe, left bronchus or lung: Secondary | ICD-10-CM

## 2022-12-14 LAB — T4: T4, Total: 7.8 ug/dL (ref 4.5–12.0)

## 2022-12-14 NOTE — Telephone Encounter (Signed)
Not needed. No longer on Alimta

## 2022-12-17 ENCOUNTER — Other Ambulatory Visit: Payer: Self-pay

## 2022-12-17 ENCOUNTER — Telehealth: Payer: Self-pay | Admitting: Medical Oncology

## 2022-12-17 ENCOUNTER — Other Ambulatory Visit (HOSPITAL_COMMUNITY): Payer: Self-pay

## 2022-12-17 DIAGNOSIS — C3432 Malignant neoplasm of lower lobe, left bronchus or lung: Secondary | ICD-10-CM

## 2022-12-17 MED ORDER — FOLIC ACID 1 MG PO TABS
1.0000 mg | ORAL_TABLET | Freq: Every day | ORAL | 0 refills | Status: DC
  Filled 2022-12-17: qty 90, 90d supply, fill #0

## 2022-12-17 NOTE — Telephone Encounter (Signed)
Refill

## 2022-12-19 ENCOUNTER — Other Ambulatory Visit: Payer: Self-pay

## 2022-12-20 ENCOUNTER — Encounter: Payer: Self-pay | Admitting: Physician Assistant

## 2022-12-20 ENCOUNTER — Encounter: Payer: Self-pay | Admitting: Internal Medicine

## 2023-01-01 ENCOUNTER — Other Ambulatory Visit: Payer: Self-pay

## 2023-01-01 NOTE — Progress Notes (Unsigned)
Cameron Harper OFFICE PROGRESS NOTE  [name], MD [place] [address]  DIAGNOSIS: Stage IV (T2b, N2, M1 B) non-small cell lung cancer, adenocarcinoma presented with large central left lower lobe lung mass with left hilar and mediastinal lymphadenopathy as well as abdominal retroperitoneal lymphadenopathy diagnosed in May 2022.  The patient also has bilateral parotid gland nodules that need close monitoring.   [facility] MOLECULAR STUDY: Results with Therapy Associations BIOMARKER METHOD ANALYTE RESULT THERAPY ASSOCIATION BIOMARKER LEVEL* .PD-L1 (22c3) IHC Protein Positive, TPS: 50% BENEFIT cemiplimab, pembrolizumab Level 1 .PD-L1 (28-8) IHC Protein Positive  1+, 50% BENEFIT nivolumab/ipilimumab combination Level 1 .TMB Seq DNA-Tumor High, 18 mut/Mb BENEFIT pembrolizumab Level 2 . alectinib, ceritinib, crizotinib, lorlatinib Level 1 . IHC Protein Negative  0 brigatinib Level 2 . ALK Seq RNA-Tumor Fusion Not Detected LACK OF BENEFIT alectinib, brigatinib, ceritinib, crizotinib, lorlatinib Level 2 .BRAF Seq DNA-Tumor Mutation Not Detected LACK OF BENEFIT dabrafenib and trametinib combination therapy, vemurafenib Level 2 .EGFR Seq DNA-Tumor Mutation Not Detected LACK OF BENEFIT erlotinib, gefitinib Level 2 .KRAS Seq DNA-Tumor Mutation Not Detected LACK OF BENEFIT sotorasib Level 2 .RET Seq RNA-Tumor Fusion Not Detected LACK OF BENEFIT pralsetinib, selpercatinib Level 2 .ROS1 Seq RNA-Tumor Fusion Not Detected LACK OF BENEFIT ceritinib, crizotinib, entrectinib, lorlatinib Level 2 . CNA-Seq DNA-Tumor Amplification Not Detected . MET Seq RNA-Tumor Variant Transcript Not Detected LACK OF BENEFIT crizotinib Level 3  PRIOR THERAPY: Palliative radiotherapy to the large central left lower lobe lung mass under the care of Dr. Lisbeth Renshaw. Last treatment on 06/12/21.     CURRENT THERAPY: Systemic chemotherapy with carboplatin for AUC of 5, Alimta 500  Mg/M2 and Keytruda 200 Mg IV every 3 weeks.  First dose expected June 14, 2021.  Status post 27 cycles of treatment.  Starting from cycle #5, the patient will begin maintenance Keytruda and Alimta.  Alimta was reduced to 400 mg per metered squared starting from cycle number 23 due to fatigue.  Alimta discontinued starting from cycle #13 due to renal insufficiency     INTERVAL HISTORY: Cameron Harper 47 y.o. male returns to the clinic today for a follow-up visit.  The patient is feeling fairly well today without any concerning complaints. The patient is currently undergoing single agent immunotherapy with Keytruda.  Alimta was discontinued secondary to elevated creatinine and fatigue. He is established with a nephrologist at this time and they are monitoring him. The patient is nervous about the prospect of possibly stopping treatment after cycle #35 which was mentioned at the patients last appointment. The patient denies any recent fever, chills, or night sweats. Denies any worsening dyspnea.  Denies any chest pain, worsening cough, or hemoptysis.  Denies any nausea, vomiting, diarrhea, or constipation.  Denies any headache or visual changes. Denies any rashes. He sometimes has dry skin due to eczema for which he uses kenalog cream.    He is here today for evaluation before starting cycle #28.     MEDICAL HISTORY: Past Medical History:  Diagnosis Date   Chronic back pain    Eczema    GERD (gastroesophageal reflux disease)    Lung cancer (HCC)     ALLERGIES:  is allergic to penicillins and zofran [ondansetron].  MEDICATIONS:  Current Outpatient Medications  Medication Sig Dispense Refill   acetaminophen (TYLENOL) 500 MG tablet Take 500-1,000 mg by mouth every 6 (six) hours as needed (for back pain.).     ferrous sulfate 325 (65 FE) MG tablet Take 325 mg by  mouth daily with breakfast.     folic acid (FOLVITE) 1 MG tablet Take 1 tablet (1 mg total) by mouth daily. 90 tablet 0    lidocaine-prilocaine (EMLA) cream Apply 1 application topically as needed. 30 g 2   prochlorperazine (COMPAZINE) 10 MG tablet Take 1 tablet (10 mg total) by mouth every 6 (six) hours as needed for nausea or vomiting. 30 tablet 0   triamcinolone cream (KENALOG) 0.1 % Apply 1 Application topically 2 (two) times daily as needed. 30 g 0   No current facility-administered medications for this visit.    SURGICAL HISTORY:  Past Surgical History:  Procedure Laterality Date   BRONCHIAL BRUSHINGS  05/12/2021   Procedure: BRONCHIAL BRUSHINGS;  Surgeon: Garner Nash, DO;  Location: [facility];  Service: Pulmonary;;   BRONCHIAL DILITATION  05/12/2021   Procedure: BRONCHIAL DILITATION;  Surgeon: Garner Nash, DO;  Location: [facility];  Service: Pulmonary;;   BRONCHIAL NEEDLE ASPIRATION BIOPSY  05/12/2021   Procedure: BRONCHIAL NEEDLE ASPIRATION BIOPSIES;  Surgeon: Garner Nash, DO;  Location: [facility];  Service: Pulmonary;;   BRONCHIAL WASHINGS  05/12/2021   Procedure: BRONCHIAL WASHINGS;  Surgeon: Garner Nash, DO;  Location: [facility];  Service: Pulmonary;;   CRYOTHERAPY  05/12/2021   Procedure: CRYOTHERAPY;  Surgeon: Garner Nash, DO;  Location: [facility];  Service: Pulmonary;;   HEMOSTASIS CONTROL  05/12/2021   Procedure: HEMOSTASIS CONTROL;  Surgeon: Garner Nash, DO;  Location: [facility];  Service: Pulmonary;;  epi injection   IR IMAGING GUIDED PORT INSERTION  06/29/2021   Spine injection     Pain control   VIDEO BRONCHOSCOPY WITH ENDOBRONCHIAL ULTRASOUND N/A 05/12/2021   Procedure: VIDEO BRONCHOSCOPY WITH ENDOBRONCHIAL ULTRASOUND;  Surgeon: Garner Nash, DO;  Location: [facility];  Service: Pulmonary;  Laterality: N/A;   WISDOM TOOTH EXTRACTION     no anesthesia involed    REVIEW OF SYSTEMS:   Constitutional: Negative for appetite change, chills, fever and unexpected weight change.  HENT: Negative for mouth sores, nosebleeds, sore throat and trouble  swallowing.   Eyes: Negative for eye problems and icterus.  Respiratory: Positive for shortness of breath with strenuous activity. Negative for cough, hemoptysis, and wheezing.   Cardiovascular: Negative for chest pain and leg swelling.  Gastrointestinal: Negative for abdominal pain, constipation, diarrhea, nausea and vomiting.  Genitourinary: Negative for bladder incontinence, difficulty urinating, dysuria, frequency and hematuria.   Musculoskeletal: Negative for back pain, gait problem, neck pain and neck stiffness.  Skin: Positive for worsening eczema.  Neurological: Negative for dizziness, extremity weakness, gait problem, headaches, light-headedness and seizures.  Hematological: Negative for adenopathy. Does not bruise/bleed easily.  Psychiatric/Behavioral: Negative for confusion, depression and sleep disturbance. The patient is not nervous/anxious   PHYSICAL EXAMINATION:  Blood pressure 122/68, pulse 74, temperature 97.7 F (36.5 C), temperature source Oral, resp. rate 14, weight 186 lb 14.4 oz (84.8 kg), SpO2 99 %.  ECOG PERFORMANCE STATUS: 1  Physical Exam  Constitutional: Oriented to person, place, and time and well-developed, well-nourished, and in no distress.  HENT:  Head: Normocephalic and atraumatic.  Mouth/Throat: Oropharynx is clear and moist. No oropharyngeal exudate.  Eyes: Conjunctivae are normal. Right eye exhibits no discharge. Left eye exhibits no discharge. No scleral icterus.  Neck: Normal range of motion. Neck supple.  Cardiovascular: Normal rate, regular rhythm, normal heart sounds and intact distal pulses.   Pulmonary/Chest: Effort normal and breath sounds normal. No respiratory distress. No wheezes. No rales.  Abdominal: Soft. Bowel  sounds are normal. Exhibits no distension and no mass. There is no tenderness.  Musculoskeletal: Normal range of motion. Exhibits no edema. No overlying skin changes.  Lymphadenopathy:    No cervical adenopathy.  Neurological:  Alert and oriented to person, place, and time. Exhibits normal muscle tone. Gait normal. Coordination normal.  Skin: Skin is warm and dry. No rash noted. Not diaphoretic. No erythema. No pallor.  Psychiatric: Mood, memory and judgment normal.  Vitals reviewed.  LABORATORY DATA: Lab Results  Component Value Date   WBC 6.9 01/03/2023   HGB 12.2 (L) 01/03/2023   HCT 37.4 (L) 01/03/2023   MCV 87.6 01/03/2023   PLT 277 01/03/2023      Chemistry      Component Value Date/Time   NA 141 01/03/2023 1054   K 4.1 01/03/2023 1054   CL 107 01/03/2023 1054   CO2 28 01/03/2023 1054   BUN 19 01/03/2023 1054   CREATININE 1.22 01/03/2023 1054      Component Value Date/Time   CALCIUM 9.2 01/03/2023 1054   ALKPHOS 123 01/03/2023 1054   AST 12 (L) 01/03/2023 1054   ALT 13 01/03/2023 1054   BILITOT 0.3 01/03/2023 1054       RADIOGRAPHIC STUDIES:  No results found.   ASSESSMENT/PLAN:  This is a very pleasant 47 year old Caucasian male diagnosed with stage IV (T2b, N2, M1 B) non-small cell lung cancer, adenocarcinoma.  He presented with a large central left lower lobe lung mass in addition to left hilar mediastinal lymphadenopathy.  He also has retroperitoneal abdominal lymphadenopathy.  He was diagnosed in May 2022.  His PD-L1 expression is 50%.  His molecular studies by [facility] did not show any evidence of any actionable mutations.  He underwent palliative radiotherapy to the large left lower lobe lung mass under the care of Dr. Lisbeth Renshaw.  He completed this on 06/12/2021.    The patient is currently undergoing systemic chemotherapy with carboplatin for an AUC of 5, Alimta 500 mg per metered squared, and Keytruda 200 mg IV every 3 weeks.  He is status post 27 cycles.  Starting from cycle #5, the patient has been on maintenance Alimta and Keytruda.  The dose of Alimta was reduced to 400 mg per metered squared starting from cycle #12 due to fatigue.  Alimta was removed from the treatment plan starting  from cycle #13 due to renal insufficiency.   Labs were reviewed.  Recommend that he proceed with cycle #28 today scheduled.   We will see him back for follow-up visit in 3 weeks for evaluation and repeat blood work before starting cycle #29.  I will arrange for a repeat scan prior to his next cycle of treatment.   The patient is nervous about the data for [place] potentially being stopped after 2 years of treatment. He has several more cycles and a few mores surveillance scans that will be performed before completing 2 years of treatment (after cycle #35). Typically, we have a risks vs benefits discussion after cycle #35 based on the results of the scan at that time. However, I encouraged him to talk to Dr. Julien Nordmann at his next appointment about his concerns.   The patient was advised to call immediately if he has any concerning symptoms in the interval. The patient voices understanding of current disease status and treatment options and is in agreement with the current care plan. All questions were answered. The patient knows to call the clinic with any problems, questions or concerns. We can certainly  see the patient much sooner if necessary    Orders Placed This Encounter  Procedures   CT Chest Wo Contrast    Standing Status:   Future    Standing Expiration Date:   01/03/2024    Order Specific Question:   Preferred imaging location?    Answer:   [facility]   CT Abdomen Pelvis Wo Contrast    Standing Status:   Future    Standing Expiration Date:   01/03/2024    Order Specific Question:   Preferred imaging location?    Answer:   [facility]    Order Specific Question:   Is Oral Contrast requested for this exam?    Answer:   Yes, Per Radiology protocol    Order Specific Question:   Does the patient have a contrast media/X-ray dye allergy?    Answer:   No     The total time spent in the appointment was 20-29 minutes  Meda Dudzinski L Elektra Wartman, PA-C 01/03/23

## 2023-01-03 ENCOUNTER — Other Ambulatory Visit: Payer: Self-pay

## 2023-01-03 ENCOUNTER — Inpatient Hospital Stay: Payer: 59

## 2023-01-03 ENCOUNTER — Inpatient Hospital Stay (HOSPITAL_BASED_OUTPATIENT_CLINIC_OR_DEPARTMENT_OTHER): Payer: 59 | Admitting: Physician Assistant

## 2023-01-03 ENCOUNTER — Inpatient Hospital Stay: Payer: 59 | Attending: Physician Assistant

## 2023-01-03 VITALS — BP 104/81 | HR 74 | Resp 16

## 2023-01-03 VITALS — BP 122/68 | HR 74 | Temp 97.7°F | Resp 14 | Wt 186.9 lb

## 2023-01-03 DIAGNOSIS — C3432 Malignant neoplasm of lower lobe, left bronchus or lung: Secondary | ICD-10-CM

## 2023-01-03 DIAGNOSIS — Z5112 Encounter for antineoplastic immunotherapy: Secondary | ICD-10-CM

## 2023-01-03 DIAGNOSIS — Z79899 Other long term (current) drug therapy: Secondary | ICD-10-CM | POA: Insufficient documentation

## 2023-01-03 LAB — CMP (CANCER CENTER ONLY)
ALT: 13 U/L (ref 0–44)
AST: 12 U/L — ABNORMAL LOW (ref 15–41)
Albumin: 3.6 g/dL (ref 3.5–5.0)
Alkaline Phosphatase: 123 U/L (ref 38–126)
Anion gap: 6 (ref 5–15)
BUN: 19 mg/dL (ref 6–20)
CO2: 28 mmol/L (ref 22–32)
Calcium: 9.2 mg/dL (ref 8.9–10.3)
Chloride: 107 mmol/L (ref 98–111)
Creatinine: 1.22 mg/dL (ref 0.61–1.24)
GFR, Estimated: >60 mL/min (ref >60)
Glucose, Bld: 80 mg/dL (ref 70–99)
Potassium: 4.1 mmol/L (ref 3.5–5.1)
Sodium: 141 mmol/L (ref 135–145)
Total Bilirubin: 0.3 mg/dL (ref 0.3–1.2)
Total Protein: 7.2 g/dL (ref 6.5–8.1)

## 2023-01-03 LAB — CBC WITH DIFFERENTIAL (CANCER CENTER ONLY)
Abs Immature Granulocytes: 0.03 10*3/uL (ref 0.00–0.07)
Basophils Absolute: 0.1 10*3/uL (ref 0.0–0.1)
Basophils Relative: 1 %
Eosinophils Absolute: 0.3 10*3/uL (ref 0.0–0.5)
Eosinophils Relative: 5 %
HCT: 37.4 % — ABNORMAL LOW (ref 39.0–52.0)
Hemoglobin: 12.2 g/dL — ABNORMAL LOW (ref 13.0–17.0)
Immature Granulocytes: 0 %
Lymphocytes Relative: 16 %
Lymphs Abs: 1.1 10*3/uL (ref 0.7–4.0)
MCH: 28.6 pg (ref 26.0–34.0)
MCHC: 32.6 g/dL (ref 30.0–36.0)
MCV: 87.6 fL (ref 80.0–100.0)
Monocytes Absolute: 0.6 10*3/uL (ref 0.1–1.0)
Monocytes Relative: 8 %
Neutro Abs: 4.8 10*3/uL (ref 1.7–7.7)
Neutrophils Relative %: 70 %
Platelet Count: 277 10*3/uL (ref 150–400)
RBC: 4.27 MIL/uL (ref 4.22–5.81)
RDW: 14.3 % (ref 11.5–15.5)
WBC Count: 6.9 10*3/uL (ref 4.0–10.5)
nRBC: 0 % (ref 0.0–0.2)

## 2023-01-03 LAB — TSH: TSH: 2.537 u[IU]/mL (ref 0.350–4.500)

## 2023-01-03 MED ORDER — SODIUM CHLORIDE 0.9 % IV SOLN: Freq: Once | INTRAVENOUS | Status: AC

## 2023-01-03 MED ORDER — HEPARIN SOD (PORK) LOCK FLUSH 100 UNIT/ML IV SOLN
500.0000 [IU] | Freq: Once | INTRAVENOUS | Status: AC | PRN
  Administered 2023-01-03: 500 [IU]

## 2023-01-03 MED ORDER — SODIUM CHLORIDE 0.9% FLUSH
10.0000 mL | INTRAVENOUS | Status: DC | PRN
  Administered 2023-01-03: 10 mL

## 2023-01-03 MED ORDER — SODIUM CHLORIDE 0.9 % IV SOLN
200.0000 mg | Freq: Once | INTRAVENOUS | Status: AC
  Administered 2023-01-03: 200 mg via INTRAVENOUS
  Filled 2023-01-03: qty 8

## 2023-01-03 NOTE — Patient Instructions (Signed)
[facility]  Discharge Instructions: Thank you for choosing [facility] to provide your oncology and hematology care.   If you have a lab appointment with the [facility], please go directly to the [facility] and check in at the registration area.   Wear comfortable clothing and clothing appropriate for easy access to any Portacath or PICC line.   We strive to give you quality time with your provider. You may need to reschedule your appointment if you arrive late (15 or more minutes).  Arriving late affects you and other patients whose appointments are after yours.  Also, if you miss three or more appointments without notifying the office, you may be dismissed from the clinic at the provider's discretion.      For prescription refill requests, have your pharmacy contact our office and allow 72 hours for refills to be completed.    Today you received the following chemotherapy and/or immunotherapy agents [name]      To help prevent nausea and vomiting after your treatment, we encourage you to take your nausea medication as directed.  BELOW ARE SYMPTOMS THAT SHOULD BE REPORTED IMMEDIATELY: *FEVER GREATER THAN 100.4 F (38 C) OR HIGHER *CHILLS OR SWEATING *NAUSEA AND VOMITING THAT IS NOT CONTROLLED WITH YOUR NAUSEA MEDICATION *UNUSUAL SHORTNESS OF BREATH *UNUSUAL BRUISING OR BLEEDING *URINARY PROBLEMS (pain or burning when urinating, or frequent urination) *BOWEL PROBLEMS (unusual diarrhea, constipation, pain near the anus) TENDERNESS IN MOUTH AND THROAT WITH OR WITHOUT PRESENCE OF ULCERS (sore throat, sores in mouth, or a toothache) UNUSUAL RASH, SWELLING OR PAIN  UNUSUAL VAGINAL DISCHARGE OR ITCHING   Items with * indicate a potential emergency and should be followed up as soon as possible or go to the Emergency Department if any problems should occur.  Please show the CHEMOTHERAPY ALERT CARD or IMMUNOTHERAPY ALERT CARD at  check-in to the Emergency Department and triage nurse.  Should you have questions after your visit or need to cancel or reschedule your appointment, please contact [facility]  Dept: [phone]  and follow the prompts.  Office hours are 8:00 a.m. to 4:30 p.m. Monday - Friday. Please note that voicemails left after 4:00 p.m. may not be returned until the following business day.  We are closed weekends and major holidays. You have access to a nurse at all times for urgent questions. Please call the main number to the clinic Dept: [phone] and follow the prompts.   For any non-urgent questions, you may also contact your provider using MyChart. We now offer e-Visits for anyone 47 and older to request care online for non-urgent symptoms. For details visit [email or web address].   Also download the MyChart app! Go to the app store, search "MyChart", open the app, select , and log in with your MyChart username and password.  Masks are optional in the cancer centers. If you would like for your care team to wear a mask while they are taking care of you, please let them know. You may have one support person who is at least 49 years old accompany you for your appointments.

## 2023-01-15 ENCOUNTER — Encounter: Payer: Self-pay | Admitting: Physician Assistant

## 2023-01-15 ENCOUNTER — Encounter: Payer: Self-pay | Admitting: Internal Medicine

## 2023-01-17 ENCOUNTER — Other Ambulatory Visit: Payer: Self-pay

## 2023-01-17 ENCOUNTER — Encounter: Payer: Self-pay | Admitting: Internal Medicine

## 2023-01-17 ENCOUNTER — Encounter: Payer: Self-pay | Admitting: Physician Assistant

## 2023-01-21 ENCOUNTER — Ambulatory Visit (HOSPITAL_COMMUNITY)
Admission: RE | Admit: 2023-01-21 | Discharge: 2023-01-21 | Disposition: A | Discharge status: Home / Self Care | Payer: 59 | Source: Ambulatory Visit | Attending: Physician Assistant | Admitting: Physician Assistant

## 2023-01-21 DIAGNOSIS — N289 Disorder of kidney and ureter, unspecified: Secondary | ICD-10-CM | POA: Diagnosis not present

## 2023-01-21 DIAGNOSIS — J439 Emphysema, unspecified: Secondary | ICD-10-CM | POA: Diagnosis not present

## 2023-01-21 DIAGNOSIS — C3490 Malignant neoplasm of unspecified part of unspecified bronchus or lung: Secondary | ICD-10-CM | POA: Diagnosis not present

## 2023-01-21 DIAGNOSIS — C3432 Malignant neoplasm of lower lobe, left bronchus or lung: Secondary | ICD-10-CM | POA: Insufficient documentation

## 2023-01-21 DIAGNOSIS — J90 Pleural effusion, not elsewhere classified: Secondary | ICD-10-CM | POA: Diagnosis not present

## 2023-01-24 ENCOUNTER — Encounter: Payer: Self-pay | Admitting: Medical Oncology

## 2023-01-24 ENCOUNTER — Inpatient Hospital Stay: Payer: 59

## 2023-01-24 ENCOUNTER — Other Ambulatory Visit: Payer: Self-pay

## 2023-01-24 ENCOUNTER — Encounter: Payer: Self-pay | Admitting: Internal Medicine

## 2023-01-24 ENCOUNTER — Inpatient Hospital Stay (HOSPITAL_BASED_OUTPATIENT_CLINIC_OR_DEPARTMENT_OTHER): Payer: 59 | Admitting: Internal Medicine

## 2023-01-24 DIAGNOSIS — C3432 Malignant neoplasm of lower lobe, left bronchus or lung: Secondary | ICD-10-CM

## 2023-01-24 DIAGNOSIS — Z79899 Other long term (current) drug therapy: Secondary | ICD-10-CM | POA: Diagnosis not present

## 2023-01-24 DIAGNOSIS — Z5112 Encounter for antineoplastic immunotherapy: Secondary | ICD-10-CM | POA: Diagnosis not present

## 2023-01-24 DIAGNOSIS — Z95828 Presence of other vascular implants and grafts: Secondary | ICD-10-CM

## 2023-01-24 LAB — CBC WITH DIFFERENTIAL (CANCER CENTER ONLY)
Abs Immature Granulocytes: 0.03 10*3/uL (ref 0.00–0.07)
Basophils Absolute: 0.1 10*3/uL (ref 0.0–0.1)
Basophils Relative: 1 %
Eosinophils Absolute: 0.4 10*3/uL (ref 0.0–0.5)
Eosinophils Relative: 5 %
HCT: 37.5 % — ABNORMAL LOW (ref 39.0–52.0)
Hemoglobin: 12.3 g/dL — ABNORMAL LOW (ref 13.0–17.0)
Immature Granulocytes: 0 %
Lymphocytes Relative: 16 %
Lymphs Abs: 1.3 10*3/uL (ref 0.7–4.0)
MCH: 28.7 pg (ref 26.0–34.0)
MCHC: 32.8 g/dL (ref 30.0–36.0)
MCV: 87.6 fL (ref 80.0–100.0)
Monocytes Absolute: 0.8 10*3/uL (ref 0.1–1.0)
Monocytes Relative: 10 %
Neutro Abs: 5.2 10*3/uL (ref 1.7–7.7)
Neutrophils Relative %: 68 %
Platelet Count: 314 10*3/uL (ref 150–400)
RBC: 4.28 MIL/uL (ref 4.22–5.81)
RDW: 14.1 % (ref 11.5–15.5)
WBC Count: 7.7 10*3/uL (ref 4.0–10.5)
nRBC: 0 % (ref 0.0–0.2)

## 2023-01-24 LAB — CMP (CANCER CENTER ONLY)
ALT: 12 U/L (ref 0–44)
AST: 12 U/L — ABNORMAL LOW (ref 15–41)
Albumin: 3.8 g/dL (ref 3.5–5.0)
Alkaline Phosphatase: 113 U/L (ref 38–126)
Anion gap: 5 (ref 5–15)
BUN: 17 mg/dL (ref 6–20)
CO2: 28 mmol/L (ref 22–32)
Calcium: 8.8 mg/dL — ABNORMAL LOW (ref 8.9–10.3)
Chloride: 107 mmol/L (ref 98–111)
Creatinine: 1.24 mg/dL (ref 0.61–1.24)
GFR, Estimated: >60 mL/min (ref >60)
Glucose, Bld: 88 mg/dL (ref 70–99)
Potassium: 4.5 mmol/L (ref 3.5–5.1)
Sodium: 140 mmol/L (ref 135–145)
Total Bilirubin: 0.3 mg/dL (ref 0.3–1.2)
Total Protein: 7.3 g/dL (ref 6.5–8.1)

## 2023-01-24 LAB — TSH: TSH: 2.674 u[IU]/mL (ref 0.350–4.500)

## 2023-01-24 MED ORDER — SODIUM CHLORIDE 0.9 % IV SOLN: Freq: Once | INTRAVENOUS | Status: AC

## 2023-01-24 MED ORDER — HEPARIN SOD (PORK) LOCK FLUSH 100 UNIT/ML IV SOLN
500.0000 [IU] | Freq: Once | INTRAVENOUS | Status: AC | PRN
  Administered 2023-01-24: 500 [IU]

## 2023-01-24 MED ORDER — SODIUM CHLORIDE 0.9% FLUSH
10.0000 mL | Freq: Once | INTRAVENOUS | Status: AC
  Administered 2023-01-24: 10 mL

## 2023-01-24 MED ORDER — SODIUM CHLORIDE 0.9% FLUSH
10.0000 mL | INTRAVENOUS | Status: DC | PRN
  Administered 2023-01-24: 10 mL

## 2023-01-24 MED ORDER — SODIUM CHLORIDE 0.9 % IV SOLN
200.0000 mg | Freq: Once | INTRAVENOUS | Status: AC
  Administered 2023-01-24: 200 mg via INTRAVENOUS
  Filled 2023-01-24: qty 8

## 2023-01-24 NOTE — Progress Notes (Signed)
[name] Telephone:([phone]) [phone]   Fax:(336) [phone]  OFFICE PROGRESS NOTE  [name], MD [place] [address]  DIAGNOSIS:  Stage IV (T2b, N2, M1 B) non-small cell lung cancer, adenocarcinoma presented with large central left lower lobe lung mass with left hilar and mediastinal lymphadenopathy as well as abdominal retroperitoneal lymphadenopathy diagnosed in May 2022.  The patient also has bilateral parotid gland nodules that need close monitoring.  [facility] MOLECULAR STUDY: Results with Therapy Associations BIOMARKER METHOD ANALYTE RESULT THERAPY ASSOCIATION BIOMARKER LEVEL* .PD-L1 (22c3) IHC Protein Positive, TPS: 50% BENEFIT cemiplimab, pembrolizumab Level 1 .PD-L1 (28-8) IHC Protein Positive  1+, 50% BENEFIT nivolumab/ipilimumab combination Level 1 .TMB Seq DNA-Tumor High, 18 mut/Mb BENEFIT pembrolizumab Level 2 . alectinib, ceritinib, crizotinib, lorlatinib Level 1 . IHC Protein Negative  0 brigatinib Level 2 . ALK Seq RNA-Tumor Fusion Not Detected LACK OF BENEFIT alectinib, brigatinib, ceritinib, crizotinib, lorlatinib Level 2 .BRAF Seq DNA-Tumor Mutation Not Detected LACK OF BENEFIT dabrafenib and trametinib combination therapy, vemurafenib Level 2 .EGFR Seq DNA-Tumor Mutation Not Detected LACK OF BENEFIT erlotinib, gefitinib Level 2 .KRAS Seq DNA-Tumor Mutation Not Detected LACK OF BENEFIT sotorasib Level 2 .RET Seq RNA-Tumor Fusion Not Detected LACK OF BENEFIT pralsetinib, selpercatinib Level 2 .ROS1 Seq RNA-Tumor Fusion Not Detected LACK OF BENEFIT ceritinib, crizotinib, entrectinib, lorlatinib Level 2 . CNA-Seq DNA-Tumor Amplification Not Detected .MET Seq RNA-Tumor Variant Transcript Not Detected LACK OF BENEFIT crizotinib Level 3  PRIOR THERAPY: Palliative radiotherapy to the large central left lower lobe lung mass under the care of Dr. Lisbeth Renshaw.  CURRENT THERAPY: Systemic chemotherapy with carboplatin for AUC  of 5, Alimta 500 Mg/M2 and possibly Keytruda 200 Mg IV every 3 weeks.  First dose expected June 14, 2021.  Starting from cycle #5 the patient is on maintenance treatment with Alimta and Keytruda every 3 weeks.  Status post 24 cycles of treatment.  Starting from cycle #12 the dose of Alimta to 400 Mg/M2 because of his increasing fatigue and side effects.  Alimta was discontinued secondary to renal insufficiency.  INTERVAL HISTORY: Cameron Harper 47 y.o. male returns to the clinic today for follow-up visit.  The patient is feeling fine today with no concerning complaints.  He has no fatigue or weakness.  He has no nausea, vomiting, diarrhea or constipation.  He has no headache or visual changes.  He denied having any recent weight loss or night sweats.  He continues to tolerate his maintenance treatment with Keytruda fairly well.  The patient is here today for evaluation with repeat CT scan of the chest, abdomen and pelvis for restaging of his disease.  MEDICAL HISTORY: Past Medical History:  Diagnosis Date   Chronic back pain    Eczema    GERD (gastroesophageal reflux disease)    Lung cancer (HCC)     ALLERGIES:  is allergic to penicillins and zofran [ondansetron].  MEDICATIONS:  Current Outpatient Medications  Medication Sig Dispense Refill   acetaminophen (TYLENOL) 500 MG tablet Take 500-1,000 mg by mouth every 6 (six) hours as needed (for back pain.).     ferrous sulfate 325 (65 FE) MG tablet Take 325 mg by mouth daily with breakfast.     folic acid (FOLVITE) 1 MG tablet Take 1 tablet (1 mg total) by mouth daily. 90 tablet 0   lidocaine-prilocaine (EMLA) cream Apply 1 application topically as needed. 30 g 2   prochlorperazine (COMPAZINE) 10 MG tablet Take 1 tablet (10 mg total) by  mouth every 6 (six) hours as needed for nausea or vomiting. 30 tablet 0   triamcinolone cream (KENALOG) 0.1 % Apply 1 Application topically 2 (two) times daily as needed. 30 g 0   No current  facility-administered medications for this visit.    SURGICAL HISTORY:  Past Surgical History:  Procedure Laterality Date   BRONCHIAL BRUSHINGS  05/12/2021   Procedure: BRONCHIAL BRUSHINGS;  Surgeon: Garner Nash, DO;  Location: [facility];  Service: Pulmonary;;   BRONCHIAL DILITATION  05/12/2021   Procedure: BRONCHIAL DILITATION;  Surgeon: Garner Nash, DO;  Location: [facility];  Service: Pulmonary;;   BRONCHIAL NEEDLE ASPIRATION BIOPSY  05/12/2021   Procedure: BRONCHIAL NEEDLE ASPIRATION BIOPSIES;  Surgeon: Garner Nash, DO;  Location: [facility];  Service: Pulmonary;;   BRONCHIAL WASHINGS  05/12/2021   Procedure: BRONCHIAL WASHINGS;  Surgeon: Garner Nash, DO;  Location: [facility];  Service: Pulmonary;;   CRYOTHERAPY  05/12/2021   Procedure: CRYOTHERAPY;  Surgeon: Garner Nash, DO;  Location: [facility];  Service: Pulmonary;;   HEMOSTASIS CONTROL  05/12/2021   Procedure: HEMOSTASIS CONTROL;  Surgeon: Garner Nash, DO;  Location: [facility];  Service: Pulmonary;;  epi injection   IR IMAGING GUIDED PORT INSERTION  06/29/2021   Spine injection     Pain control   VIDEO BRONCHOSCOPY WITH ENDOBRONCHIAL ULTRASOUND N/A 05/12/2021   Procedure: VIDEO BRONCHOSCOPY WITH ENDOBRONCHIAL ULTRASOUND;  Surgeon: Garner Nash, DO;  Location: [facility];  Service: Pulmonary;  Laterality: N/A;   WISDOM TOOTH EXTRACTION     no anesthesia involed    REVIEW OF SYSTEMS:  Constitutional: negative Eyes: negative Ears, nose, mouth, throat, and face: negative Respiratory: negative Cardiovascular: negative Gastrointestinal: negative Genitourinary:negative Integument/breast: negative Hematologic/lymphatic: negative Musculoskeletal:negative Neurological: negative Behavioral/Psych: negative Endocrine: negative Allergic/Immunologic: negative   PHYSICAL EXAMINATION: General appearance: alert, cooperative, and no distress Head: Normocephalic, without obvious abnormality,  atraumatic Neck: no adenopathy, no JVD, supple, symmetrical, trachea midline, and thyroid not enlarged, symmetric, no tenderness/mass/nodules Lymph nodes: Cervical, supraclavicular, and axillary nodes normal. Resp: clear to auscultation bilaterally Back: symmetric, no curvature. ROM normal. No CVA tenderness. Cardio: regular rate and rhythm, S1, S2 normal, no murmur, click, rub or gallop GI: soft, non-tender; bowel sounds normal; no masses,  no organomegaly Extremities: extremities normal, atraumatic, no cyanosis or edema Neurologic: Alert and oriented X 3, normal strength and tone. Normal symmetric reflexes. Normal coordination and gait  ECOG PERFORMANCE STATUS: 0 - Asymptomatic  Blood pressure 117/74, pulse 60, temperature 97.6 F (36.4 C), temperature source Temporal, resp. rate 19, weight 187 lb 3 oz (84.9 kg), SpO2 99 %.  LABORATORY DATA: Lab Results  Component Value Date   WBC 7.7 01/24/2023   HGB 12.3 (L) 01/24/2023   HCT 37.5 (L) 01/24/2023   MCV 87.6 01/24/2023   PLT 314 01/24/2023      Chemistry      Component Value Date/Time   NA 141 01/03/2023 1054   K 4.1 01/03/2023 1054   CL 107 01/03/2023 1054   CO2 28 01/03/2023 1054   BUN 19 01/03/2023 1054   CREATININE 1.22 01/03/2023 1054      Component Value Date/Time   CALCIUM 9.2 01/03/2023 1054   ALKPHOS 123 01/03/2023 1054   AST 12 (L) 01/03/2023 1054   ALT 13 01/03/2023 1054   BILITOT 0.3 01/03/2023 1054       RADIOGRAPHIC STUDIES: CT Chest Wo Contrast  Result Date: 01/21/2023 CLINICAL DATA:  Non-small cell lung cancer, assess treatment response. Radiation  therapy complete. Keytruda in progress. * Tracking Code: BO * EXAM: CT CHEST, ABDOMEN AND PELVIS WITHOUT CONTRAST TECHNIQUE: Multidetector CT imaging of the chest, abdomen and pelvis was performed following the standard protocol without IV contrast. RADIATION DOSE REDUCTION: This exam was performed according to the departmental dose-optimization program which  includes automated exposure control, adjustment of the mA and/or kV according to patient size and/or use of iterative reconstruction technique. COMPARISON:  11/19/2022. FINDINGS: CT CHEST FINDINGS Cardiovascular: Right IJ Port-A-Cath terminates in the SVC. Atherosclerotic calcification of the aorta. Heart is at the upper limits of normal in size. No pericardial effusion. Mediastinum/Nodes: No pathologically enlarged mediastinal or axillary lymph nodes. Hilar regions are difficult to definitively evaluate without IV contrast. Esophagus is grossly unremarkable. Lungs/Pleura: Centrilobular emphysema. Pulmonary nodules measure up to 5 mm in the inferior right lower lobe (4/127), as before. Minimal scattered scarring. Pulmonary retraction and bronchiectasis in the medial left hemithorax, compatible with radiation therapy. Heterogeneous, slightly loculated moderate left pleural effusion with pleural thickening, stable. Airway is unremarkable. Musculoskeletal: No worrisome lytic or sclerotic lesions. CT ABDOMEN PELVIS FINDINGS Hepatobiliary: Liver and gallbladder are unremarkable. No biliary ductal dilatation. Pancreas: Negative. Spleen: Negative. Adrenals/Urinary Tract: Adrenal glands and right kidney are unremarkable. 2.5 cm low-attenuation lesion in the upper pole left kidney. No specific follow-up necessary. Ureters are decompressed. Bladder is grossly unremarkable. Stomach/Bowel: Stomach, small bowel, appendix and colon are unremarkable. Vascular/Lymphatic: Atherosclerotic calcification of the aorta. No pathologically enlarged lymph nodes. Reproductive: Prostate is visualized. Other: Small bilateral inguinal hernias contain fat. No free fluid. Mesenteries and peritoneum are unremarkable. Musculoskeletal: No worrisome lytic or sclerotic lesions. IMPRESSION: 1. Small pulmonary nodules, similar. Recommend continued attention on follow-up as metastatic disease cannot be excluded. No additional evidence of metastatic  disease. 2. Complex loculated moderate left pleural effusion, as before. 3.  Aortic atherosclerosis (ICD10-I70.0). 4.  Emphysema (ICD10-J43.9). Electronically Signed   By: Lorin Picket M.D.   On: 01/21/2023 10:49   CT Abdomen Pelvis Wo Contrast  Result Date: 01/21/2023 CLINICAL DATA:  Non-small cell lung cancer, assess treatment response. Radiation therapy complete. Keytruda in progress. * Tracking Code: BO * EXAM: CT CHEST, ABDOMEN AND PELVIS WITHOUT CONTRAST TECHNIQUE: Multidetector CT imaging of the chest, abdomen and pelvis was performed following the standard protocol without IV contrast. RADIATION DOSE REDUCTION: This exam was performed according to the departmental dose-optimization program which includes automated exposure control, adjustment of the mA and/or kV according to patient size and/or use of iterative reconstruction technique. COMPARISON:  11/19/2022. FINDINGS: CT CHEST FINDINGS Cardiovascular: Right IJ Port-A-Cath terminates in the SVC. Atherosclerotic calcification of the aorta. Heart is at the upper limits of normal in size. No pericardial effusion. Mediastinum/Nodes: No pathologically enlarged mediastinal or axillary lymph nodes. Hilar regions are difficult to definitively evaluate without IV contrast. Esophagus is grossly unremarkable. Lungs/Pleura: Centrilobular emphysema. Pulmonary nodules measure up to 5 mm in the inferior right lower lobe (4/127), as before. Minimal scattered scarring. Pulmonary retraction and bronchiectasis in the medial left hemithorax, compatible with radiation therapy. Heterogeneous, slightly loculated moderate left pleural effusion with pleural thickening, stable. Airway is unremarkable. Musculoskeletal: No worrisome lytic or sclerotic lesions. CT ABDOMEN PELVIS FINDINGS Hepatobiliary: Liver and gallbladder are unremarkable. No biliary ductal dilatation. Pancreas: Negative. Spleen: Negative. Adrenals/Urinary Tract: Adrenal glands and right kidney are  unremarkable. 2.5 cm low-attenuation lesion in the upper pole left kidney. No specific follow-up necessary. Ureters are decompressed. Bladder is grossly unremarkable. Stomach/Bowel: Stomach, small bowel, appendix and colon are unremarkable. Vascular/Lymphatic: Atherosclerotic calcification  of the aorta. No pathologically enlarged lymph nodes. Reproductive: Prostate is visualized. Other: Small bilateral inguinal hernias contain fat. No free fluid. Mesenteries and peritoneum are unremarkable. Musculoskeletal: No worrisome lytic or sclerotic lesions. IMPRESSION: 1. Small pulmonary nodules, similar. Recommend continued attention on follow-up as metastatic disease cannot be excluded. No additional evidence of metastatic disease. 2. Complex loculated moderate left pleural effusion, as before. 3.  Aortic atherosclerosis (ICD10-I70.0). 4.  Emphysema (ICD10-J43.9). Electronically Signed   By: Lorin Picket M.D.   On: 01/21/2023 10:49    ASSESSMENT AND PLAN: This is a very pleasant 47 years old white male recently diagnosed with a stage IV (T2b, N2, M1 B) non-small cell lung cancer, adenocarcinoma presented with large central left lower lobe lung mass in addition to left hilar and mediastinal lymphadenopathy as well as retroperitoneal abdominal lymph node diagnosed in May 2022.  The patient is currently undergoing palliative radiotherapy to the large left lower lobe lung mass under the care of of Dr. Lisbeth Renshaw.  He is expected to complete this course of treatment on June 12, 2021. MRI of the brain showed no evidence of metastatic disease to the brain. His molecular studies by [facility] showed no actionable mutations and PD-L1 expression of 50%. He is currently undergoing systemic chemotherapy with carboplatin for AUC of 5, Alimta 500 Mg/M2 and Keytruda 200 Mg IV every 3 weeks.  Starting from cycle #5 he is on maintenance treatment with Alimta at 400 Mg/M2 and Keytruda 200 Mg IV every 3 weeks status post 28 cycles.  He is  currently on single agent Keytruda secondary to renal insufficiency. The patient has been tolerating his treatment with Keytruda fairly well with no concerning adverse effects. He had repeat CT scan of the chest, abdomen and pelvis performed recently.  I personally and independently reviewed the scan and discussed the result with the patient today. His scan showed no concerning findings for disease recurrence or metastasis. I recommended for him to continue on his maintenance treatment with [facility] and he will proceed with cycle #29 today. I will see him back for follow-up visit in 3 weeks for evaluation before starting cycle #30. For the renal insufficiency he is followed by nephrology. For the anemia of chronic disease, he will continue his current treatment with iron tablets. The patient was advised to call immediately if he has any other concerning symptoms in the interval.  The patient voices understanding of current disease status and treatment options and is in agreement with the current care plan. All questions were answered. The patient knows to call the clinic with any problems, questions or concerns. We can certainly see the patient much sooner if necessary.  Disclaimer: This note was dictated with voice recognition software. Similar sounding words can inadvertently be transcribed and may not be corrected upon review.

## 2023-01-24 NOTE — Patient Instructions (Signed)
Cameron Harper  Discharge Instructions: Thank you for choosing [facility] to provide your oncology and hematology care.   If you have a lab appointment with the [facility], please go directly to the [facility] and check in at the registration area.   Wear comfortable clothing and clothing appropriate for easy access to any Portacath or PICC line.   We strive to give you quality time with your provider. You may need to reschedule your appointment if you arrive late (15 or more minutes).  Arriving late affects you and other patients whose appointments are after yours.  Also, if you miss three or more appointments without notifying the office, you may be dismissed from the clinic at the provider's discretion.      For prescription refill requests, have your pharmacy contact our office and allow 72 hours for refills to be completed.    Today you received the following chemotherapy and/or immunotherapy agents: Keytruda      To help prevent nausea and vomiting after your treatment, we encourage you to take your nausea medication as directed.  BELOW ARE SYMPTOMS THAT SHOULD BE REPORTED IMMEDIATELY: *FEVER GREATER THAN 100.4 F (38 C) OR HIGHER *CHILLS OR SWEATING *NAUSEA AND VOMITING THAT IS NOT CONTROLLED WITH YOUR NAUSEA MEDICATION *UNUSUAL SHORTNESS OF BREATH *UNUSUAL BRUISING OR BLEEDING *URINARY PROBLEMS (pain or burning when urinating, or frequent urination) *BOWEL PROBLEMS (unusual diarrhea, constipation, pain near the anus) TENDERNESS IN MOUTH AND THROAT WITH OR WITHOUT PRESENCE OF ULCERS (sore throat, sores in mouth, or a toothache) UNUSUAL RASH, SWELLING OR PAIN  UNUSUAL VAGINAL DISCHARGE OR ITCHING   Items with * indicate a potential emergency and should be followed up as soon as possible or go to the Emergency Department if any problems should occur.  Please show the CHEMOTHERAPY ALERT CARD or IMMUNOTHERAPY ALERT CARD at  check-in to the Emergency Department and triage nurse.  Should you have questions after your visit or need to cancel or reschedule your appointment, please contact [facility]  Dept: [phone]  and follow the prompts.  Office hours are 8:00 a.m. to 4:30 p.m. Monday - Friday. Please note that voicemails left after 4:00 p.m. may not be returned until the following business day.  We are closed weekends and major holidays. You have access to a nurse at all times for urgent questions. Please call the main number to the clinic Dept: [phone] and follow the prompts.   For any non-urgent questions, you may also contact your provider using MyChart. We now offer e-Visits for anyone 14 and older to request care online for non-urgent symptoms. For details visit [email or web address].   Also download the MyChart app! Go to the app store, search "MyChart", open the app, select [facility], and log in with your MyChart username and password.

## 2023-01-24 NOTE — Progress Notes (Signed)
Patient seen by MD today  Vitals are within treatment parameters.  Labs reviewed: and are within treatment parameters.  Per physician team, patient is ready for treatment and there are NO modifications to the treatment plan.

## 2023-01-25 ENCOUNTER — Other Ambulatory Visit: Payer: Self-pay

## 2023-02-06 ENCOUNTER — Encounter: Payer: Self-pay | Admitting: Physician Assistant

## 2023-02-06 ENCOUNTER — Encounter: Payer: Self-pay | Admitting: Internal Medicine

## 2023-02-14 ENCOUNTER — Inpatient Hospital Stay: Payer: 59

## 2023-02-14 ENCOUNTER — Other Ambulatory Visit: Payer: 59

## 2023-02-14 ENCOUNTER — Inpatient Hospital Stay: Payer: 59 | Attending: Physician Assistant | Admitting: Internal Medicine

## 2023-02-14 ENCOUNTER — Encounter: Payer: Self-pay | Admitting: Internal Medicine

## 2023-02-14 ENCOUNTER — Other Ambulatory Visit: Payer: Self-pay

## 2023-02-14 VITALS — BP 118/76 | HR 68 | Temp 97.7°F | Resp 19 | Wt 187.1 lb

## 2023-02-14 DIAGNOSIS — C3432 Malignant neoplasm of lower lobe, left bronchus or lung: Secondary | ICD-10-CM | POA: Diagnosis not present

## 2023-02-14 DIAGNOSIS — Z79620 Long term (current) use of immunosuppressive biologic: Secondary | ICD-10-CM | POA: Insufficient documentation

## 2023-02-14 DIAGNOSIS — Z5112 Encounter for antineoplastic immunotherapy: Secondary | ICD-10-CM | POA: Insufficient documentation

## 2023-02-14 DIAGNOSIS — Z95828 Presence of other vascular implants and grafts: Secondary | ICD-10-CM

## 2023-02-14 LAB — CBC WITH DIFFERENTIAL (CANCER CENTER ONLY)
Abs Immature Granulocytes: 0.03 10*3/uL (ref 0.00–0.07)
Basophils Absolute: 0.1 10*3/uL (ref 0.0–0.1)
Basophils Relative: 1 %
Eosinophils Absolute: 0.4 10*3/uL (ref 0.0–0.5)
Eosinophils Relative: 6 %
HCT: 39.5 % (ref 39.0–52.0)
Hemoglobin: 12.9 g/dL — ABNORMAL LOW (ref 13.0–17.0)
Immature Granulocytes: 0 %
Lymphocytes Relative: 15 %
Lymphs Abs: 1.1 10*3/uL (ref 0.7–4.0)
MCH: 29 pg (ref 26.0–34.0)
MCHC: 32.7 g/dL (ref 30.0–36.0)
MCV: 88.8 fL (ref 80.0–100.0)
Monocytes Absolute: 0.8 10*3/uL (ref 0.1–1.0)
Monocytes Relative: 10 %
Neutro Abs: 5.1 10*3/uL (ref 1.7–7.7)
Neutrophils Relative %: 68 %
Platelet Count: 188 10*3/uL (ref 150–400)
RBC: 4.45 MIL/uL (ref 4.22–5.81)
RDW: 14.3 % (ref 11.5–15.5)
WBC Count: 7.5 10*3/uL (ref 4.0–10.5)
nRBC: 0 % (ref 0.0–0.2)

## 2023-02-14 LAB — CMP (CANCER CENTER ONLY)
ALT: 15 U/L (ref 0–44)
AST: 13 U/L — ABNORMAL LOW (ref 15–41)
Albumin: 3.9 g/dL (ref 3.5–5.0)
Alkaline Phosphatase: 107 U/L (ref 38–126)
Anion gap: 5 (ref 5–15)
BUN: 17 mg/dL (ref 6–20)
CO2: 27 mmol/L (ref 22–32)
Calcium: 9.2 mg/dL (ref 8.9–10.3)
Chloride: 107 mmol/L (ref 98–111)
Creatinine: 1.32 mg/dL — ABNORMAL HIGH (ref 0.61–1.24)
GFR, Estimated: >60 mL/min (ref >60)
Glucose, Bld: 88 mg/dL (ref 70–99)
Potassium: 4.4 mmol/L (ref 3.5–5.1)
Sodium: 139 mmol/L (ref 135–145)
Total Bilirubin: 0.3 mg/dL (ref 0.3–1.2)
Total Protein: 7.4 g/dL (ref 6.5–8.1)

## 2023-02-14 LAB — TSH: TSH: 3.177 u[IU]/mL (ref 0.350–4.500)

## 2023-02-14 MED ORDER — SODIUM CHLORIDE 0.9% FLUSH
10.0000 mL | Freq: Once | INTRAVENOUS | Status: AC
  Administered 2023-02-14: 10 mL

## 2023-02-14 MED ORDER — HEPARIN SOD (PORK) LOCK FLUSH 100 UNIT/ML IV SOLN
500.0000 [IU] | Freq: Once | INTRAVENOUS | Status: AC | PRN
  Administered 2023-02-14: 500 [IU]

## 2023-02-14 MED ORDER — SODIUM CHLORIDE 0.9 % IV SOLN: Freq: Once | INTRAVENOUS | Status: AC

## 2023-02-14 MED ORDER — SODIUM CHLORIDE 0.9 % IV SOLN
200.0000 mg | Freq: Once | INTRAVENOUS | Status: AC
  Administered 2023-02-14: 200 mg via INTRAVENOUS
  Filled 2023-02-14: qty 8

## 2023-02-14 MED ORDER — SODIUM CHLORIDE 0.9% FLUSH
10.0000 mL | INTRAVENOUS | Status: DC | PRN
  Administered 2023-02-14: 10 mL

## 2023-02-14 NOTE — Progress Notes (Signed)
Patient seen by MD today  Vitals are within treatment parameters.  Labs reviewed: and are within treatment parameters.  Per physician team, patient is ready for treatment and there are NO modifications to the treatment plan.  

## 2023-02-14 NOTE — Patient Instructions (Signed)
[facility]  Discharge Instructions: Thank you for choosing [facility] to provide your oncology and hematology care.   If you have a lab appointment with the Cancer Center, please go directly to the Cancer Center and check in at the registration area.   Wear comfortable clothing and clothing appropriate for easy access to any Portacath or PICC line.   We strive to give you quality time with your provider. You may need to reschedule your appointment if you arrive late (15 or more minutes).  Arriving late affects you and other patients whose appointments are after yours.  Also, if you miss three or more appointments without notifying the office, you may be dismissed from the clinic at the provider's discretion.      For prescription refill requests, have your pharmacy contact our office and allow 72 hours for refills to be completed.    Today you received the following chemotherapy and/or immunotherapy agents: Keytruda      To help prevent nausea and vomiting after your treatment, we encourage you to take your nausea medication as directed.  BELOW ARE SYMPTOMS THAT SHOULD BE REPORTED IMMEDIATELY: *FEVER GREATER THAN 100.4 F (38 C) OR HIGHER *CHILLS OR SWEATING *NAUSEA AND VOMITING THAT IS NOT CONTROLLED WITH YOUR NAUSEA MEDICATION *UNUSUAL SHORTNESS OF BREATH *UNUSUAL BRUISING OR BLEEDING *URINARY PROBLEMS (pain or burning when urinating, or frequent urination) *BOWEL PROBLEMS (unusual diarrhea, constipation, pain near the anus) TENDERNESS IN MOUTH AND THROAT WITH OR WITHOUT PRESENCE OF ULCERS (sore throat, sores in mouth, or a toothache) UNUSUAL RASH, SWELLING OR PAIN  UNUSUAL VAGINAL DISCHARGE OR ITCHING   Items with * indicate a potential emergency and should be followed up as soon as possible or go to the Emergency Department if any problems should occur.  Please show the CHEMOTHERAPY ALERT CARD or IMMUNOTHERAPY ALERT CARD at  check-in to the Emergency Department and triage nurse.  Should you have questions after your visit or need to cancel or reschedule your appointment, please contact [facility]  Dept: [phone]  and follow the prompts.  Office hours are 8:00 a.m. to 4:30 p.m. Monday - Friday. Please note that voicemails left after 4:00 p.m. may not be returned until the following business day.  We are closed weekends and major holidays. You have access to a nurse at all times for urgent questions. Please call the main number to the clinic Dept: [phone] and follow the prompts.   For any non-urgent questions, you may also contact your provider using MyChart. We now offer e-Visits for anyone 18 and older to request care online for non-urgent symptoms. For details visit [email or web address].   Also download the MyChart app! Go to the app store, search "MyChart", open the app, select , and log in with your MyChart username and password.   

## 2023-02-14 NOTE — Progress Notes (Signed)
[place] Telephone:([phone]   [phone]  OFFICE PROGRESS NOTE  [name], MD [place] [id number]  DIAGNOSIS:  Stage IV (T2b, N2, M1 B) non-small cell lung cancer, adenocarcinoma presented with large central left lower lobe lung mass with left hilar and mediastinal lymphadenopathy as well as abdominal retroperitoneal lymphadenopathy diagnosed in May 2022.  The patient also has bilateral parotid gland nodules that need close monitoring.  [facility] MOLECULAR STUDY: Results with Therapy Associations [employer]OMARK[employer] METHOD ANALYTE RESULT THERAPY ASSOCIATION BIOMARKER LEVEL* .PD-L1 (22c3) IHC Protein Positive, TPS: 50% BENEFIT cemiplimab, pembrolizumab Level 1 .PD-L1 (28-8) IHC Protein Positive  1+, 50% BENEFIT nivolumab/ipilimumab combination Level 1 .TMB Seq DNA-Tumor High, 18 mut/Mb BENEFIT pembrolizumab Level 2 . alectinib, ceritinib, crizotinib, lorlatinib Level 1 . IHC Protein Negative  0 brigatinib Level 2 . ALK Seq RNA-Tumor Fusion Not Detected LACK OF BENEFIT alectinib, brigatinib, ceritinib, crizotinib, lorlatinib Level 2 .BRAF Seq DNA-Tumor Mutation Not Detected LACK OF BENEFIT dabrafenib and trametinib combination therapy, vemurafenib Level 2 .EGFR Seq DNA-Tumor Mutation Not Detected LACK OF BENEFIT erlotinib, gefitinib Level 2 .KRAS Seq DNA-Tumor Mutation Not Detected LACK OF BENEFIT sotorasib Level 2 .RET Seq RNA-Tumor Fusion Not Detected LACK OF BENEFIT pralsetinib, selpercatinib Level 2 .ROS1 Seq RNA-Tumor Fusion Not Detected LACK OF BENEFIT ceritinib, crizotinib, entrectinib, lorlatinib Level 2 . CNA-Seq DNA-Tumor Amplification Not Detected .MET Seq RNA-Tumor Variant Transcript Not Detected LACK OF BENEFIT crizotinib Level 3  PRIOR THERAPY: Palliative radiotherapy to the large central left lower lobe lung mass under the care of Dr. Lisbeth Renshaw.  CURRENT THERAPY: Systemic chemotherapy with carboplatin for AUC  of 5, Alimta 500 Mg/M2 and possibly Keytruda 200 Mg IV every 3 weeks.  First dose expected June 14, 2021.  Starting from cycle #5 the patient is on maintenance treatment with Alimta and Keytruda every 3 weeks.  Status post 29 cycles of treatment.  Starting from cycle #12 the dose of Alimta to 400 Mg/M2 because of his increasing fatigue and side effects.  Alimta was discontinued secondary to renal insufficiency.  INTERVAL HISTORY: Cameron Harper 47 y.o. male returns to the clinic today for follow-up visit.  The patient is feeling fine today with no concerning complaints except for chronic low back pain that has been going on for several years and he was followed by Dr. Ellene Route with neurosurgery.  It improved at some point but he started having more fatigue recently especially when he sat down.  He denied having any current chest pain, shortness of breath, cough or hemoptysis.  He has no nausea, vomiting, diarrhea or constipation.  He has no headache or visual changes.  He has no significant weight loss or night sweats.  He has been tolerating his treatment with Keytruda fairly well.  He is here for evaluation before starting cycle #30.  MEDICAL HISTORY: Past Medical History:  Diagnosis Date   Chronic back pain    Eczema    GERD (gastroesophageal reflux disease)    Lung cancer (HCC)     ALLERGIES:  is allergic to penicillins and zofran [ondansetron].  MEDICATIONS:  Current Outpatient Medications  Medication Sig Dispense Refill   acetaminophen (TYLENOL) 500 MG tablet Take 500-1,000 mg by mouth every 6 (six) hours as needed (for back pain.).     ferrous sulfate 325 (65 FE) MG tablet Take 325 mg by mouth daily with breakfast.     folic acid (FOLVITE) 1 MG tablet Take 1 tablet (1 mg total) by  mouth daily. 90 tablet 0   lidocaine-prilocaine (EMLA) cream Apply 1 application topically as needed. 30 g 2   prochlorperazine (COMPAZINE) 10 MG tablet Take 1 tablet (10 mg total) by mouth every 6 (six)  hours as needed for nausea or vomiting. 30 tablet 0   triamcinolone cream (KENALOG) 0.1 % Apply 1 Application topically 2 (two) times daily as needed. 30 g 0   No current facility-administered medications for this visit.    SURGICAL HISTORY:  Past Surgical History:  Procedure Laterality Date   BRONCHIAL BRUSHINGS  05/12/2021   Procedure: BRONCHIAL BRUSHINGS;  Surgeon: Garner Nash, DO;  Location: [place];  Service: Pulmonary;;   BRONCHIAL DILITATION  05/12/2021   Procedure: BRONCHIAL DILITATION;  Surgeon: Garner Nash, DO;  Location: [place];  Service: Pulmonary;;   BRONCHIAL NEEDLE ASPIRATION BIOPSY  05/12/2021   Procedure: BRONCHIAL NEEDLE ASPIRATION BIOPSIES;  Surgeon: Garner Nash, DO;  Location: [place];  Service: Pulmonary;;   BRONCHIAL WASHINGS  05/12/2021   Procedure: BRONCHIAL WASHINGS;  Surgeon: Garner Nash, DO;  Location: [place];  Service: Pulmonary;;   CRYOTHERAPY  05/12/2021   Procedure: CRYOTHERAPY;  Surgeon: Garner Nash, DO;  Location: [place];  Service: Pulmonary;;   HEMOSTASIS CONTROL  05/12/2021   Procedure: HEMOSTASIS CONTROL;  Surgeon: Garner Nash, DO;  Location: [place];  Service: Pulmonary;;  epi injection   IR IMAGING GUIDED PORT INSERTION  06/29/2021   Spine injection     Pain control   VIDEO BRONCHOSCOPY WITH ENDOBRONCHIAL ULTRASOUND N/A 05/12/2021   Procedure: VIDEO BRONCHOSCOPY WITH ENDOBRONCHIAL ULTRASOUND;  Surgeon: Garner Nash, DO;  Location: [place];  Service: Pulmonary;  Laterality: N/A;   WISDOM TOOTH EXTRACTION     no anesthesia involed    REVIEW OF SYSTEMS:  A comprehensive review of systems was negative except for: Musculoskeletal: positive for back pain   PHYSICAL EXAMINATION: General appearance: alert, cooperative, and no distress Head: Normocephalic, without obvious abnormality, atraumatic Neck: no adenopathy, no JVD, supple, symmetrical, trachea midline, and thyroid not enlarged,  symmetric, no tenderness/mass/nodules Lymph nodes: Cervical, supraclavicular, and axillary nodes normal. Resp: clear to auscultation bilaterally Back: symmetric, no curvature. ROM normal. No CVA tenderness. Cardio: regular rate and rhythm, S1, S2 normal, no murmur, click, rub or gallop GI: soft, non-tender; bowel sounds normal; no masses,  no organomegaly Extremities: extremities normal, atraumatic, no cyanosis or edema  ECOG PERFORMANCE STATUS: 1 - Symptomatic but completely ambulatory  Blood pressure 118/76, pulse 68, temperature 97.7 F (36.5 C), temperature source Oral, resp. rate 19, weight 187 lb 1 oz (84.9 kg), SpO2 99 %.  LABORATORY DATA: Lab Results  Component Value Date   WBC 7.7 01/24/2023   HGB 12.3 (L) 01/24/2023   HCT 37.5 (L) 01/24/2023   MCV 87.6 01/24/2023   PLT 314 01/24/2023      Chemistry      Component Value Date/Time   NA 140 01/24/2023 1017   K 4.5 01/24/2023 1017   CL 107 01/24/2023 1017   CO2 28 01/24/2023 1017   BUN 17 01/24/2023 1017   CREATININE 1.24 01/24/2023 1017      Component Value Date/Time   CALCIUM 8.8 (L) 01/24/2023 1017   ALKPHOS 113 01/24/2023 1017   AST 12 (L) 01/24/2023 1017   ALT 12 01/24/2023 1017   BILITOT 0.3 01/24/2023 1017       RADIOGRAPHIC STUDIES: CT Chest Wo Contrast  Result Date: 01/21/2023 CLINICAL DATA:  Non-small cell lung cancer, assess  treatment response. Radiation therapy complete. Keytruda in progress. * Tracking Code: BO * EXAM: CT CHEST, ABDOMEN AND PELVIS WITHOUT CONTRAST TECHNIQUE: Multidetector CT imaging of the chest, abdomen and pelvis was performed following the standard protocol without IV contrast. RADIATION DOSE REDUCTION: This exam was performed according to the departmental dose-optimization program which includes automated exposure control, adjustment of the mA and/or kV according to patient size and/or use of iterative reconstruction technique. COMPARISON:  11/19/2022. FINDINGS: CT CHEST FINDINGS  Cardiovascular: Right IJ Port-A-Cath terminates in the SVC. Atherosclerotic calcification of the aorta. Heart is at the upper limits of normal in size. No pericardial effusion. Mediastinum/Nodes: No pathologically enlarged mediastinal or axillary lymph nodes. Hilar regions are difficult to definitively evaluate without IV contrast. Esophagus is grossly unremarkable. Lungs/Pleura: Centrilobular emphysema. Pulmonary nodules measure up to 5 mm in the inferior right lower lobe (4/127), as before. Minimal scattered scarring. Pulmonary retraction and bronchiectasis in the medial left hemithorax, compatible with radiation therapy. Heterogeneous, slightly loculated moderate left pleural effusion with pleural thickening, stable. Airway is unremarkable. Musculoskeletal: No worrisome lytic or sclerotic lesions. CT ABDOMEN PELVIS FINDINGS Hepatobiliary: Liver and gallbladder are unremarkable. No biliary ductal dilatation. Pancreas: Negative. Spleen: Negative. Adrenals/Urinary Tract: Adrenal glands and right kidney are unremarkable. 2.5 cm low-attenuation lesion in the upper pole left kidney. No specific follow-up necessary. Ureters are decompressed. Bladder is grossly unremarkable. Stomach/Bowel: Stomach, small bowel, appendix and colon are unremarkable. Vascular/Lymphatic: Atherosclerotic calcification of the aorta. No pathologically enlarged lymph nodes. Reproductive: Prostate is visualized. Other: Small bilateral inguinal hernias contain fat. No free fluid. Mesenteries and peritoneum are unremarkable. Musculoskeletal: No worrisome lytic or sclerotic lesions. IMPRESSION: 1. Small pulmonary nodules, similar. Recommend continued attention on follow-up as metastatic disease cannot be excluded. No additional evidence of metastatic disease. 2. Complex loculated moderate left pleural effusion, as before. 3.  Aortic atherosclerosis (ICD10-I70.0). 4.  Emphysema (ICD10-J43.9). Electronically Signed   By: Lorin Picket M.D.   On:  01/21/2023 10:49   CT Abdomen Pelvis Wo Contrast  Result Date: 01/21/2023 CLINICAL DATA:  Non-small cell lung cancer, assess treatment response. Radiation therapy complete. Keytruda in progress. * Tracking Code: BO * EXAM: CT CHEST, ABDOMEN AND PELVIS WITHOUT CONTRAST TECHNIQUE: Multidetector CT imaging of the chest, abdomen and pelvis was performed following the standard protocol without IV contrast. RADIATION DOSE REDUCTION: This exam was performed according to the departmental dose-optimization program which includes automated exposure control, adjustment of the mA and/or kV according to patient size and/or use of iterative reconstruction technique. COMPARISON:  11/19/2022. FINDINGS: CT CHEST FINDINGS Cardiovascular: Right IJ Port-A-Cath terminates in the SVC. Atherosclerotic calcification of the aorta. Heart is at the upper limits of normal in size. No pericardial effusion. Mediastinum/Nodes: No pathologically enlarged mediastinal or axillary lymph nodes. Hilar regions are difficult to definitively evaluate without IV contrast. Esophagus is grossly unremarkable. Lungs/Pleura: Centrilobular emphysema. Pulmonary nodules measure up to 5 mm in the inferior right lower lobe (4/127), as before. Minimal scattered scarring. Pulmonary retraction and bronchiectasis in the medial left hemithorax, compatible with radiation therapy. Heterogeneous, slightly loculated moderate left pleural effusion with pleural thickening, stable. Airway is unremarkable. Musculoskeletal: No worrisome lytic or sclerotic lesions. CT ABDOMEN PELVIS FINDINGS Hepatobiliary: Liver and gallbladder are unremarkable. No biliary ductal dilatation. Pancreas: Negative. Spleen: Negative. Adrenals/Urinary Tract: Adrenal glands and right kidney are unremarkable. 2.5 cm low-attenuation lesion in the upper pole left kidney. No specific follow-up necessary. Ureters are decompressed. Bladder is grossly unremarkable. Stomach/Bowel: Stomach, small bowel,  appendix and colon are unremarkable.  Vascular/Lymphatic: Atherosclerotic calcification of the aorta. No pathologically enlarged lymph nodes. Reproductive: Prostate is visualized. Other: Small bilateral inguinal hernias contain fat. No free fluid. Mesenteries and peritoneum are unremarkable. Musculoskeletal: No worrisome lytic or sclerotic lesions. IMPRESSION: 1. Small pulmonary nodules, similar. Recommend continued attention on follow-up as metastatic disease cannot be excluded. No additional evidence of metastatic disease. 2. Complex loculated moderate left pleural effusion, as before. 3.  Aortic atherosclerosis (ICD10-I70.0). 4.  Emphysema (ICD10-J43.9). Electronically Signed   By: Lorin Picket M.D.   On: 01/21/2023 10:49    ASSESSMENT AND PLAN: This is a very pleasant 47 years old white male recently diagnosed with a stage IV (T2b, N2, M1 B) non-small cell lung cancer, adenocarcinoma presented with large central left lower lobe lung mass in addition to left hilar and mediastinal lymphadenopathy as well as retroperitoneal abdominal lymph node diagnosed in May 2022.  The patient is currently undergoing palliative radiotherapy to the large left lower lobe lung mass under the care of of Dr. Lisbeth Renshaw.  He is expected to complete this course of treatment on June 12, 2021. MRI of the brain showed no evidence of metastatic disease to the brain. His molecular studies by [facility] showed no actionable mutations and PD-L1 expression of 50%. He is currently undergoing systemic chemotherapy with carboplatin for AUC of 5, Alimta 500 Mg/M2 and Keytruda 200 Mg IV every 3 weeks.  Starting from cycle #5 he is on maintenance treatment with Alimta at 400 Mg/M2 and Keytruda 200 Mg IV every 3 weeks status post 29 cycles.  He is currently on single agent Keytruda secondary to renal insufficiency. The patient has been tolerating this treatment well with no concerning adverse effects. I recommended for him to proceed with cycle #13  today as planned. For the chronic back pain he will continue on Tylenol and occasional ibuprofen if needed.  He was also advised to reach out to his neurosurgeon to see if there is any additional intervention needed. For the renal insufficiency he is followed by nephrology. For the anemia of chronic disease, he will continue his current treatment with iron tablets. I will see him back for follow-up visit in 3 weeks for evaluation before the next cycle of his treatment. The patient was advised to call immediately if he has any concerning symptoms in the interval. The patient voices understanding of current disease status and treatment options and is in agreement with the current care plan. All questions were answered. The patient knows to call the clinic with any problems, questions or concerns. We can certainly see the patient much sooner if necessary.  Disclaimer: This note was dictated with voice recognition software. Similar sounding words can inadvertently be transcribed and may not be corrected upon review.

## 2023-02-15 LAB — T4: T4, Total: 8.9 ug/dL (ref 4.5–12.0)

## 2023-03-04 NOTE — Progress Notes (Addendum)
[facility] OFFICE PROGRESS NOTE  [name], MD [address] [place] [address]  DIAGNOSIS: Stage IV (T2b, N2, M1 B) non-small cell lung cancer, adenocarcinoma presented with large central left lower lobe lung mass with left hilar and mediastinal lymphadenopathy as well as abdominal retroperitoneal lymphadenopathy diagnosed in May 2022.  The patient also has bilateral parotid gland nodules that need close monitoring.   [facility] MOLECULAR STUDY: Results with Therapy Associations BIOMARKER METHOD ANALYTE RESULT THERAPY ASSOCIATION BIOMARKER LEVEL* .PD-L1 (22c3) IHC Protein Positive, TPS: 50% BENEFIT cemiplimab, pembrolizumab Level 1 .PD-L1 (28-8) IHC Protein Positive  1+, 50% BENEFIT nivolumab/ipilimumab combination Level 1 .TMB Seq DNA-Tumor High, 18 mut/Mb BENEFIT pembrolizumab Level 2 . alectinib, ceritinib, crizotinib, lorlatinib Level 1 . IHC Protein Negative  0 brigatinib Level 2 . ALK Seq RNA-Tumor Fusion Not Detected LACK OF BENEFIT alectinib, brigatinib, ceritinib, crizotinib, lorlatinib Level 2 .BRAF Seq DNA-Tumor Mutation Not Detected LACK OF BENEFIT dabrafenib and trametinib combination therapy, vemurafenib Level 2 .EGFR Seq DNA-Tumor Mutation Not Detected LACK OF BENEFIT erlotinib, gefitinib Level 2 .KRAS Seq DNA-Tumor Mutation Not Detected LACK OF BENEFIT sotorasib Level 2 .RET Seq RNA-Tumor Fusion Not Detected LACK OF BENEFIT pralsetinib, selpercatinib Level 2 .ROS1 Seq RNA-Tumor Fusion Not Detected LACK OF BENEFIT ceritinib, crizotinib, entrectinib, lorlatinib Level 2 . CNA-Seq DNA-Tumor Amplification Not Detected . MET Seq RNA-Tumor Variant Transcript Not Detected LACK OF BENEFIT crizotinib Level 3  PRIOR THERAPY: Palliative radiotherapy to the large central left lower lobe lung mass under the care of Dr. Mitzi Hansen. Last treatment on 06/12/21.     CURRENT THERAPY: Systemic chemotherapy with carboplatin for AUC of 5, Alimta 500  Mg/M2 and Keytruda 200 Mg IV every 3 weeks.  First dose expected June 14, 2021.  Status post 30 cycles of treatment.  Starting from cycle #5, the patient will begin maintenance Keytruda and Alimta.  Alimta was reduced to 400 mg per metered squared starting from cycle number 23 due to fatigue.  Alimta discontinued starting from cycle #13 due to renal insufficiency     INTERVAL HISTORY: Cameron Harper 47 y.o. male returns to the clinic today for a follow-up visit.  The patient was last seen 3 weeks ago by Dr. Arbutus Ped.  The patient is feeling fairly well today without any concerning complaints except for he is wondering if it is ok to get a deep cleaning of his teeth. He reportedly needs more thorough dental cleaning than just a regular dental cleaning. It sounds like he has some gingival disease/inflammation.  The deep cleaning is broken up into 2 days reportedly.  The patient wants to ensure that this is okay with his immunotherapy.    He is currently undergoing single agent immunotherapy with Keytruda.  Alimta was discontinued secondary to elevated creatinine and fatigue.  He follows closely with nephrology.  Today he denies any fever, chills, night sweats, or unexplained weight loss.  Denies any worsening dyspnea on exertion.  Denies any chest pain, cough, or hemoptysis. Denies any nausea, vomiting, diarrhea, or constipation.  Denies any headache or visual changes. Denies any rashes. He sometimes has dry skin due to eczema for which he uses kenalog cream.   He is here today for evaluation before starting cycle #31.   MEDICAL HISTORY: Past Medical History:  Diagnosis Date   Chronic back pain    Eczema    GERD (gastroesophageal reflux disease)    Lung cancer (HCC)     ALLERGIES:  is allergic to penicillins and zofran [ondansetron].  MEDICATIONS:  Current Outpatient Medications  Medication Sig Dispense Refill   acetaminophen (TYLENOL) 500 MG tablet Take 500-1,000 mg by mouth every 6 (six)  hours as needed (for back pain.).     ferrous sulfate 325 (65 FE) MG tablet Take 325 mg by mouth daily with breakfast.     folic acid (FOLVITE) 1 MG tablet Take 1 tablet (1 mg total) by mouth daily. 90 tablet 0   lidocaine-prilocaine (EMLA) cream Apply 1 application topically as needed. 30 g 2   prochlorperazine (COMPAZINE) 10 MG tablet Take 1 tablet (10 mg total) by mouth every 6 (six) hours as needed for nausea or vomiting. 30 tablet 0   triamcinolone cream (KENALOG) 0.1 % Apply 1 Application topically 2 (two) times daily as needed. 30 g 0   No current facility-administered medications for this visit.   Facility-Administered Medications Ordered in Other Visits  Medication Dose Route Frequency Provider Last Rate Last Admin   heparin lock flush 100 unit/mL  500 Units Intracatheter Once PRN Si Gaul, MD       pembrolizumab [facility]) 200 mg in sodium chloride 0.9 % 50 mL chemo infusion  200 mg Intravenous Once Si Gaul, MD       sodium chloride flush (NS) 0.9 % injection 10 mL  10 mL Intracatheter PRN Si Gaul, MD        SURGICAL HISTORY:  Past Surgical History:  Procedure Laterality Date   BRONCHIAL BRUSHINGS  05/12/2021   Procedure: BRONCHIAL BRUSHINGS;  Surgeon: Josephine Igo, DO;  Location: [facility];  Service: Pulmonary;;   BRONCHIAL DILITATION  05/12/2021   Procedure: BRONCHIAL DILITATION;  Surgeon: Josephine Igo, DO;  Location: [facility];  Service: Pulmonary;;   BRONCHIAL NEEDLE ASPIRATION BIOPSY  05/12/2021   Procedure: BRONCHIAL NEEDLE ASPIRATION BIOPSIES;  Surgeon: Josephine Igo, DO;  Location: [facility];  Service: Pulmonary;;   BRONCHIAL WASHINGS  05/12/2021   Procedure: BRONCHIAL WASHINGS;  Surgeon: Josephine Igo, DO;  Location: [facility];  Service: Pulmonary;;   CRYOTHERAPY  05/12/2021   Procedure: CRYOTHERAPY;  Surgeon: Josephine Igo, DO;  Location: [facility];  Service: Pulmonary;;   HEMOSTASIS CONTROL  05/12/2021   Procedure:  HEMOSTASIS CONTROL;  Surgeon: Josephine Igo, DO;  Location: [facility];  Service: Pulmonary;;  epi injection   IR IMAGING GUIDED PORT INSERTION  06/29/2021   Spine injection     Pain control   VIDEO BRONCHOSCOPY WITH ENDOBRONCHIAL ULTRASOUND N/A 05/12/2021   Procedure: VIDEO BRONCHOSCOPY WITH ENDOBRONCHIAL ULTRASOUND;  Surgeon: Josephine Igo, DO;  Location: [facility];  Service: Pulmonary;  Laterality: N/A;   WISDOM TOOTH EXTRACTION     no anesthesia involed    REVIEW OF SYSTEMS:   Constitutional: Negative for appetite change, chills, fever and unexpected weight change.  HENT: Negative for mouth sores, nosebleeds, sore throat and trouble swallowing.  Eyes: Negative for eye problems and icterus.  Respiratory: Positive for shortness of breath with strenuous activity (unchanged). Negative for cough, hemoptysis, and wheezing.   Cardiovascular: Negative for chest pain and leg swelling.  Gastrointestinal: Negative for abdominal pain, constipation, diarrhea, nausea and vomiting.  Genitourinary: Negative for bladder incontinence, difficulty urinating, dysuria, frequency and hematuria.   Musculoskeletal: Negative for back pain, gait problem, neck pain and neck stiffness.  Skin: Positive for eczema.  Neurological: Negative for dizziness, extremity weakness, gait problem, headaches, light-headedness and seizures.  Hematological: Negative for adenopathy. Does not bruise/bleed easily.  Psychiatric/Behavioral: Negative for confusion, depression and sleep disturbance. The patient  is not nervous/anxious   PHYSICAL EXAMINATION:  Blood pressure 116/71, pulse 75, temperature 97.9 F (36.6 C), temperature source Temporal, resp. rate 17, height 5\' 9"  (1.753 m), weight 188 lb 4.8 oz (85.4 kg), SpO2 98 %.  ECOG PERFORMANCE STATUS: 1  Physical Exam  Constitutional: Oriented to person, place, and time and well-developed, well-nourished, and in no distress.  HENT:  Head: Normocephalic and atraumatic.   Mouth/Throat: Oropharynx is clear and moist. No oropharyngeal exudate.  Eyes: Conjunctivae are normal. Right eye exhibits no discharge. Left eye exhibits no discharge. No scleral icterus.  Neck: Normal range of motion. Neck supple.  Cardiovascular: Normal rate, regular rhythm, normal heart sounds and intact distal pulses.   Pulmonary/Chest: Effort normal and breath sounds normal. No respiratory distress. No wheezes. No rales.  Abdominal: Soft. Bowel sounds are normal. Exhibits no distension and no mass. There is no tenderness.  Musculoskeletal: Normal range of motion. Exhibits no edema.  Lymphadenopathy:    No cervical adenopathy.  Neurological: Alert and oriented to person, place, and time. Exhibits normal muscle tone. Gait normal. Coordination normal.  Skin: Skin is warm and dry. No rash noted. Not diaphoretic. No erythema. No pallor.  Psychiatric: Mood, memory and judgment normal.  Vitals reviewed.  LABORATORY DATA: Lab Results  Component Value Date   WBC 7.5 03/07/2023   HGB 12.5 (L) 03/07/2023   HCT 38.5 (L) 03/07/2023   MCV 88.5 03/07/2023   PLT 273 03/07/2023      Chemistry      Component Value Date/Time   NA 140 03/07/2023 1109   K 4.2 03/07/2023 1109   CL 107 03/07/2023 1109   CO2 27 03/07/2023 1109   BUN 17 03/07/2023 1109   CREATININE 1.26 (H) 03/07/2023 1109      Component Value Date/Time   CALCIUM 9.5 03/07/2023 1109   ALKPHOS 102 03/07/2023 1109   AST 11 (L) 03/07/2023 1109   ALT 12 03/07/2023 1109   BILITOT 0.3 03/07/2023 1109       RADIOGRAPHIC STUDIES:  No results found.   ASSESSMENT/PLAN:  This is a very pleasant 47 year old Caucasian male diagnosed with stage IV (T2b, N2, M1 B) non-small cell lung cancer, adenocarcinoma.  He presented with a large central left lower lobe lung mass in addition to left hilar mediastinal lymphadenopathy.  He also has retroperitoneal abdominal lymphadenopathy.  He was diagnosed in May 2022.  His PD-L1 expression is  50%.  His molecular studies by [facility] did not show any evidence of any actionable mutations.  He underwent palliative radiotherapy to the large left lower lobe lung mass under the care of Dr. Mitzi Hansen.  He completed this on 06/12/2021.    The patient is currently undergoing systemic chemotherapy with carboplatin for an AUC of 5, Alimta 500 mg per metered squared, and Keytruda 200 mg IV every 3 weeks.  He is status post 30 cycles.  Starting from cycle #5, the patient has been on maintenance Alimta and Keytruda.  The dose of Alimta was reduced to 400 mg per metered squared starting from cycle #12 due to fatigue.  Alimta was removed from the treatment plan starting from cycle #13 due to renal insufficiency.   Labs were reviewed.  Recommend that he proceed with cycle #31 today scheduled.    We will see him back for follow-up visit in 3 weeks for evaluation and repeat blood work before starting cycle #32.   He will need one more scan after this cycle or next cycle, then he will  need another restaging CT scan after cycle #35. We will go ahead and order the scan now, then arrange for the next restaging CT after cycle #35.   I double checked with Dr. Arbutus Ped.  It is okay to have a deep dental cleaning while he is on immunotherapy.  The patient was advised to call immediately if she has any concerning symptoms in the interval. The patient voices understanding of current disease status and treatment options and is in agreement with the current care plan. All questions were answered. The patient knows to call the clinic with any problems, questions or concerns. We can certainly see the patient much sooner if necessary    No orders of the defined types were placed in this encounter.    The total time spent in the appointment was 20-29 minutes  Stavros Cail L Raeana Blinn, PA-C 03/07/23

## 2023-03-05 ENCOUNTER — Other Ambulatory Visit: Payer: Self-pay

## 2023-03-05 ENCOUNTER — Encounter: Payer: Self-pay | Admitting: Internal Medicine

## 2023-03-05 ENCOUNTER — Encounter: Payer: Self-pay | Admitting: Physician Assistant

## 2023-03-07 ENCOUNTER — Inpatient Hospital Stay: Payer: 59

## 2023-03-07 ENCOUNTER — Inpatient Hospital Stay: Payer: 59 | Attending: Physician Assistant

## 2023-03-07 ENCOUNTER — Inpatient Hospital Stay (HOSPITAL_BASED_OUTPATIENT_CLINIC_OR_DEPARTMENT_OTHER): Payer: 59 | Admitting: Physician Assistant

## 2023-03-07 ENCOUNTER — Other Ambulatory Visit: Payer: Self-pay

## 2023-03-07 VITALS — BP 115/71 | HR 72 | Resp 17

## 2023-03-07 VITALS — BP 116/71 | HR 75 | Temp 97.9°F | Resp 17 | Ht 69.0 in | Wt 188.3 lb

## 2023-03-07 DIAGNOSIS — Z5112 Encounter for antineoplastic immunotherapy: Secondary | ICD-10-CM | POA: Insufficient documentation

## 2023-03-07 DIAGNOSIS — Z79620 Long term (current) use of immunosuppressive biologic: Secondary | ICD-10-CM | POA: Diagnosis not present

## 2023-03-07 DIAGNOSIS — C3432 Malignant neoplasm of lower lobe, left bronchus or lung: Secondary | ICD-10-CM | POA: Insufficient documentation

## 2023-03-07 DIAGNOSIS — Z95828 Presence of other vascular implants and grafts: Secondary | ICD-10-CM

## 2023-03-07 LAB — CBC WITH DIFFERENTIAL (CANCER CENTER ONLY)
Abs Immature Granulocytes: 0.03 10*3/uL (ref 0.00–0.07)
Basophils Absolute: 0.1 10*3/uL (ref 0.0–0.1)
Basophils Relative: 1 %
Eosinophils Absolute: 0.3 10*3/uL (ref 0.0–0.5)
Eosinophils Relative: 5 %
HCT: 38.5 % — ABNORMAL LOW (ref 39.0–52.0)
Hemoglobin: 12.5 g/dL — ABNORMAL LOW (ref 13.0–17.0)
Immature Granulocytes: 0 %
Lymphocytes Relative: 12 %
Lymphs Abs: 0.9 10*3/uL (ref 0.7–4.0)
MCH: 28.7 pg (ref 26.0–34.0)
MCHC: 32.5 g/dL (ref 30.0–36.0)
MCV: 88.5 fL (ref 80.0–100.0)
Monocytes Absolute: 0.8 10*3/uL (ref 0.1–1.0)
Monocytes Relative: 10 %
Neutro Abs: 5.4 10*3/uL (ref 1.7–7.7)
Neutrophils Relative %: 72 %
Platelet Count: 273 10*3/uL (ref 150–400)
RBC: 4.35 MIL/uL (ref 4.22–5.81)
RDW: 13.9 % (ref 11.5–15.5)
WBC Count: 7.5 10*3/uL (ref 4.0–10.5)
nRBC: 0 % (ref 0.0–0.2)

## 2023-03-07 LAB — CMP (CANCER CENTER ONLY)
ALT: 12 U/L (ref 0–44)
AST: 11 U/L — ABNORMAL LOW (ref 15–41)
Albumin: 3.8 g/dL (ref 3.5–5.0)
Alkaline Phosphatase: 102 U/L (ref 38–126)
Anion gap: 6 (ref 5–15)
BUN: 17 mg/dL (ref 6–20)
CO2: 27 mmol/L (ref 22–32)
Calcium: 9.5 mg/dL (ref 8.9–10.3)
Chloride: 107 mmol/L (ref 98–111)
Creatinine: 1.26 mg/dL — ABNORMAL HIGH (ref 0.61–1.24)
GFR, Estimated: >60 mL/min (ref >60)
Glucose, Bld: 88 mg/dL (ref 70–99)
Potassium: 4.2 mmol/L (ref 3.5–5.1)
Sodium: 140 mmol/L (ref 135–145)
Total Bilirubin: 0.3 mg/dL (ref 0.3–1.2)
Total Protein: 7.3 g/dL (ref 6.5–8.1)

## 2023-03-07 LAB — TSH: TSH: 1.969 u[IU]/mL (ref 0.350–4.500)

## 2023-03-07 MED ORDER — SODIUM CHLORIDE 0.9% FLUSH
10.0000 mL | INTRAVENOUS | Status: DC | PRN
  Administered 2023-03-07: 10 mL

## 2023-03-07 MED ORDER — HEPARIN SOD (PORK) LOCK FLUSH 100 UNIT/ML IV SOLN
500.0000 [IU] | Freq: Once | INTRAVENOUS | Status: AC | PRN
  Administered 2023-03-07: 500 [IU]

## 2023-03-07 MED ORDER — SODIUM CHLORIDE 0.9% FLUSH
10.0000 mL | Freq: Once | INTRAVENOUS | Status: AC
  Administered 2023-03-07: 10 mL

## 2023-03-07 MED ORDER — SODIUM CHLORIDE 0.9 % IV SOLN
200.0000 mg | Freq: Once | INTRAVENOUS | Status: AC
  Administered 2023-03-07: 200 mg via INTRAVENOUS
  Filled 2023-03-07: qty 8

## 2023-03-07 MED ORDER — SODIUM CHLORIDE 0.9 % IV SOLN: Freq: Once | INTRAVENOUS | Status: AC

## 2023-03-07 NOTE — Addendum Note (Signed)
Addended by: Gracelyn Nurse on: 03/07/2023 12:32 PM   Modules accepted: Orders

## 2023-03-09 ENCOUNTER — Other Ambulatory Visit: Payer: Self-pay

## 2023-03-20 ENCOUNTER — Telehealth: Payer: Self-pay | Admitting: Medical Oncology

## 2023-03-20 ENCOUNTER — Other Ambulatory Visit: Payer: Self-pay | Admitting: Physician Assistant

## 2023-03-20 DIAGNOSIS — C3432 Malignant neoplasm of lower lobe, left bronchus or lung: Secondary | ICD-10-CM

## 2023-03-20 NOTE — Telephone Encounter (Signed)
Pt notified that he does not need to drink barium for his CT C/A/P.

## 2023-03-26 ENCOUNTER — Encounter (HOSPITAL_COMMUNITY): Payer: Self-pay

## 2023-03-26 ENCOUNTER — Ambulatory Visit (HOSPITAL_COMMUNITY)
Admission: RE | Admit: 2023-03-26 | Discharge: 2023-03-26 | Disposition: A | Discharge status: Home / Self Care | Payer: 59 | Source: Ambulatory Visit | Attending: Physician Assistant | Admitting: Physician Assistant

## 2023-03-26 DIAGNOSIS — K5730 Diverticulosis of large intestine without perforation or abscess without bleeding: Secondary | ICD-10-CM | POA: Diagnosis not present

## 2023-03-26 DIAGNOSIS — J432 Centrilobular emphysema: Secondary | ICD-10-CM | POA: Diagnosis not present

## 2023-03-26 DIAGNOSIS — C3432 Malignant neoplasm of lower lobe, left bronchus or lung: Secondary | ICD-10-CM

## 2023-03-26 DIAGNOSIS — K3189 Other diseases of stomach and duodenum: Secondary | ICD-10-CM | POA: Diagnosis not present

## 2023-03-26 DIAGNOSIS — R911 Solitary pulmonary nodule: Secondary | ICD-10-CM | POA: Diagnosis not present

## 2023-03-26 DIAGNOSIS — N289 Disorder of kidney and ureter, unspecified: Secondary | ICD-10-CM | POA: Insufficient documentation

## 2023-03-26 LAB — POCT I-STAT CREATININE: Creatinine, Ser: 1.4 mg/dL — ABNORMAL HIGH (ref 0.61–1.24)

## 2023-03-26 MED ORDER — HEPARIN SOD (PORK) LOCK FLUSH 100 UNIT/ML IV SOLN
INTRAVENOUS | Status: AC
  Filled 2023-03-26: qty 5

## 2023-03-26 MED ORDER — SODIUM CHLORIDE (PF) 0.9 % IJ SOLN
INTRAMUSCULAR | Status: AC
  Filled 2023-03-26: qty 50

## 2023-03-26 MED ORDER — IOHEXOL 300 MG/ML  SOLN
100.0000 mL | Freq: Once | INTRAMUSCULAR | Status: AC | PRN
  Administered 2023-03-26: 60 mL via INTRAVENOUS

## 2023-03-26 MED ORDER — HEPARIN SOD (PORK) LOCK FLUSH 100 UNIT/ML IV SOLN
500.0000 [IU] | Freq: Once | INTRAVENOUS | Status: AC
  Administered 2023-03-26: 500 [IU] via INTRAVENOUS

## 2023-03-27 ENCOUNTER — Other Ambulatory Visit: Payer: Self-pay

## 2023-03-28 ENCOUNTER — Inpatient Hospital Stay: Payer: 59

## 2023-03-28 ENCOUNTER — Inpatient Hospital Stay (HOSPITAL_BASED_OUTPATIENT_CLINIC_OR_DEPARTMENT_OTHER): Payer: 59 | Admitting: Internal Medicine

## 2023-03-28 ENCOUNTER — Other Ambulatory Visit: Payer: Self-pay

## 2023-03-28 VITALS — BP 124/67 | HR 71 | Temp 97.8°F | Resp 18 | Ht 69.0 in | Wt 188.6 lb

## 2023-03-28 VITALS — BP 105/64 | HR 67 | Resp 18

## 2023-03-28 DIAGNOSIS — C3432 Malignant neoplasm of lower lobe, left bronchus or lung: Secondary | ICD-10-CM | POA: Diagnosis not present

## 2023-03-28 DIAGNOSIS — Z79620 Long term (current) use of immunosuppressive biologic: Secondary | ICD-10-CM | POA: Diagnosis not present

## 2023-03-28 DIAGNOSIS — Z95828 Presence of other vascular implants and grafts: Secondary | ICD-10-CM

## 2023-03-28 DIAGNOSIS — Z5112 Encounter for antineoplastic immunotherapy: Secondary | ICD-10-CM | POA: Diagnosis not present

## 2023-03-28 LAB — CBC WITH DIFFERENTIAL (CANCER CENTER ONLY)
Abs Immature Granulocytes: 0.04 10*3/uL (ref 0.00–0.07)
Basophils Absolute: 0.1 10*3/uL (ref 0.0–0.1)
Basophils Relative: 1 %
Eosinophils Absolute: 0.4 10*3/uL (ref 0.0–0.5)
Eosinophils Relative: 5 %
HCT: 36.2 % — ABNORMAL LOW (ref 39.0–52.0)
Hemoglobin: 12.1 g/dL — ABNORMAL LOW (ref 13.0–17.0)
Immature Granulocytes: 1 %
Lymphocytes Relative: 12 %
Lymphs Abs: 1 10*3/uL (ref 0.7–4.0)
MCH: 29.6 pg (ref 26.0–34.0)
MCHC: 33.4 g/dL (ref 30.0–36.0)
MCV: 88.5 fL (ref 80.0–100.0)
Monocytes Absolute: 0.8 10*3/uL (ref 0.1–1.0)
Monocytes Relative: 10 %
Neutro Abs: 5.9 10*3/uL (ref 1.7–7.7)
Neutrophils Relative %: 71 %
Platelet Count: 264 10*3/uL (ref 150–400)
RBC: 4.09 MIL/uL — ABNORMAL LOW (ref 4.22–5.81)
RDW: 13.8 % (ref 11.5–15.5)
WBC Count: 8.3 10*3/uL (ref 4.0–10.5)
nRBC: 0 % (ref 0.0–0.2)

## 2023-03-28 LAB — CMP (CANCER CENTER ONLY)
ALT: 13 U/L (ref 0–44)
AST: 12 U/L — ABNORMAL LOW (ref 15–41)
Albumin: 3.7 g/dL (ref 3.5–5.0)
Alkaline Phosphatase: 106 U/L (ref 38–126)
Anion gap: 5 (ref 5–15)
BUN: 16 mg/dL (ref 6–20)
CO2: 28 mmol/L (ref 22–32)
Calcium: 9 mg/dL (ref 8.9–10.3)
Chloride: 108 mmol/L (ref 98–111)
Creatinine: 1.34 mg/dL — ABNORMAL HIGH (ref 0.61–1.24)
GFR, Estimated: >60 mL/min (ref >60)
Glucose, Bld: 104 mg/dL — ABNORMAL HIGH (ref 70–99)
Potassium: 3.9 mmol/L (ref 3.5–5.1)
Sodium: 141 mmol/L (ref 135–145)
Total Bilirubin: 0.2 mg/dL — ABNORMAL LOW (ref 0.3–1.2)
Total Protein: 6.8 g/dL (ref 6.5–8.1)

## 2023-03-28 MED ORDER — SODIUM CHLORIDE 0.9 % IV SOLN
200.0000 mg | Freq: Once | INTRAVENOUS | Status: AC
  Administered 2023-03-28: 200 mg via INTRAVENOUS
  Filled 2023-03-28: qty 200

## 2023-03-28 MED ORDER — SODIUM CHLORIDE 0.9% FLUSH
10.0000 mL | Freq: Once | INTRAVENOUS | Status: AC
  Administered 2023-03-28: 10 mL

## 2023-03-28 MED ORDER — SODIUM CHLORIDE 0.9 % IV SOLN: Freq: Once | INTRAVENOUS | Status: AC

## 2023-03-28 MED ORDER — SODIUM CHLORIDE 0.9% FLUSH
10.0000 mL | INTRAVENOUS | Status: DC | PRN
  Administered 2023-03-28: 10 mL

## 2023-03-28 MED ORDER — HEPARIN SOD (PORK) LOCK FLUSH 100 UNIT/ML IV SOLN
500.0000 [IU] | Freq: Once | INTRAVENOUS | Status: AC | PRN
  Administered 2023-03-28: 500 [IU]

## 2023-03-28 NOTE — Progress Notes (Signed)
[facility] [phone]   Fax:([phone]) [phone]  OFFICE PROGRESS NOTE  [name], MD [address] [place] [address]  DIAGNOSIS:  Stage IV (T2b, N2, M1 B) non-small cell lung cancer, adenocarcinoma presented with large central left lower lobe lung mass with left hilar and mediastinal lymphadenopathy as well as abdominal retroperitoneal lymphadenopathy diagnosed in May 2022.  The patient also has bilateral parotid gland nodules that need close monitoring.  [facility] MOLECULAR STUDY: Results with Therapy Associations BIOMARKER METHOD ANALYTE RESULT THERAPY ASSOCIATION BIOMARKER LEVEL* .PD-L1 (22c3) IHC Protein Positive, TPS: 50% BENEFIT cemiplimab, pembrolizumab Level 1 .PD-L1 (28-8) IHC Protein Positive  1+, 50% BENEFIT nivolumab/ipilimumab combination Level 1 .TMB Seq DNA-Tumor High, 18 mut/Mb BENEFIT pembrolizumab Level 2 . alectinib, ceritinib, crizotinib, lorlatinib Level 1 . IHC Protein Negative  0 brigatinib Level 2 . ALK Seq RNA-Tumor Fusion Not Detected LACK OF BENEFIT alectinib, brigatinib, ceritinib, crizotinib, lorlatinib Level 2 .BRAF Seq DNA-Tumor Mutation Not Detected LACK OF BENEFIT dabrafenib and trametinib combination therapy, vemurafenib Level 2 .EGFR Seq DNA-Tumor Mutation Not Detected LACK OF BENEFIT erlotinib, gefitinib Level 2 .KRAS Seq DNA-Tumor Mutation Not Detected LACK OF BENEFIT sotorasib Level 2 .RET Seq RNA-Tumor Fusion Not Detected LACK OF BENEFIT pralsetinib, selpercatinib Level 2 .ROS1 Seq RNA-Tumor Fusion Not Detected LACK OF BENEFIT ceritinib, crizotinib, entrectinib, lorlatinib Level 2 . CNA-Seq DNA-Tumor Amplification Not Detected .MET Seq RNA-Tumor Variant Transcript Not Detected LACK OF BENEFIT crizotinib Level 3  PRIOR THERAPY: Palliative radiotherapy to the large central left lower lobe lung mass under the care of Dr. Mitzi Hansen.  CURRENT THERAPY: Systemic chemotherapy with carboplatin for AUC  of 5, Alimta 500 Mg/M2 and possibly Keytruda 200 Mg IV every 3 weeks.  First dose expected June 14, 2021.  Starting from cycle #5 the patient is on maintenance treatment with Alimta and Keytruda every 3 weeks.  Status post 31 cycles of treatment.  Starting from cycle #12 the dose of Alimta to 400 Mg/M2 because of his increasing fatigue and side effects.  Alimta was discontinued secondary to renal insufficiency.  INTERVAL HISTORY: Cameron Harper 47 y.o. male returns to the clinic today for follow-up visit.  The patient is feeling fine today with no concerning complaints.  He denied having any chest pain, shortness of breath, cough or hemoptysis.  He has no nausea, vomiting, diarrhea or constipation.  He has no headache or visual changes.  He denied having any significant weight loss or night sweats.  He has been tolerating his treatment with single agent Keytruda fairly well.  He had repeat CT scan of the chest, abdomen and pelvis performed recently and he is here for evaluation and discussion of his scan results.  MEDICAL HISTORY: Past Medical History:  Diagnosis Date   Chronic back pain    Eczema    GERD (gastroesophageal reflux disease)    Lung cancer     ALLERGIES:  is allergic to penicillins and zofran [ondansetron].  MEDICATIONS:  Current Outpatient Medications  Medication Sig Dispense Refill   acetaminophen (TYLENOL) 500 MG tablet Take 500-1,000 mg by mouth every 6 (six) hours as needed (for back pain.).     ferrous sulfate 325 (65 FE) MG tablet Take 325 mg by mouth daily with breakfast.     folic acid (FOLVITE) 1 MG tablet Take 1 tablet (1 mg total) by mouth daily. 90 tablet 0   lidocaine-prilocaine (EMLA) cream Apply 1 application topically as needed. 30 g 2   prochlorperazine (COMPAZINE) 10  MG tablet Take 1 tablet (10 mg total) by mouth every 6 (six) hours as needed for nausea or vomiting. 30 tablet 0   triamcinolone cream (KENALOG) 0.1 % Apply 1 Application topically 2 (two)  times daily as needed. 30 g 0   No current facility-administered medications for this visit.    SURGICAL HISTORY:  Past Surgical History:  Procedure Laterality Date   BRONCHIAL BRUSHINGS  05/12/2021   Procedure: BRONCHIAL BRUSHINGS;  Surgeon: Josephine Igo, DO;  Location: [facility];  Service: Pulmonary;;   BRONCHIAL DILITATION  05/12/2021   Procedure: BRONCHIAL DILITATION;  Surgeon: Josephine Igo, DO;  Location: [facility];  Service: Pulmonary;;   BRONCHIAL NEEDLE ASPIRATION BIOPSY  05/12/2021   Procedure: BRONCHIAL NEEDLE ASPIRATION BIOPSIES;  Surgeon: Josephine Igo, DO;  Location: [facility];  Service: Pulmonary;;   BRONCHIAL WASHINGS  05/12/2021   Procedure: BRONCHIAL WASHINGS;  Surgeon: Josephine Igo, DO;  Location: [facility];  Service: Pulmonary;;   CRYOTHERAPY  05/12/2021   Procedure: CRYOTHERAPY;  Surgeon: Josephine Igo, DO;  Location: [facility];  Service: Pulmonary;;   HEMOSTASIS CONTROL  05/12/2021   Procedure: HEMOSTASIS CONTROL;  Surgeon: Josephine Igo, DO;  Location: [facility];  Service: Pulmonary;;  epi injection   IR IMAGING GUIDED PORT INSERTION  06/29/2021   Spine injection     Pain control   VIDEO BRONCHOSCOPY WITH ENDOBRONCHIAL ULTRASOUND N/A 05/12/2021   Procedure: VIDEO BRONCHOSCOPY WITH ENDOBRONCHIAL ULTRASOUND;  Surgeon: Josephine Igo, DO;  Location: [facility];  Service: Pulmonary;  Laterality: N/A;   WISDOM TOOTH EXTRACTION     no anesthesia involed    REVIEW OF SYSTEMS:  Constitutional: negative Eyes: negative Ears, nose, mouth, throat, and face: negative Respiratory: negative Cardiovascular: negative Gastrointestinal: negative Genitourinary:negative Integument/breast: negative Hematologic/lymphatic: negative Musculoskeletal:negative Neurological: negative Behavioral/Psych: negative Endocrine: negative Allergic/Immunologic: negative   PHYSICAL EXAMINATION: General appearance: alert, cooperative, and no distress Head:  Normocephalic, without obvious abnormality, atraumatic Neck: no adenopathy, no JVD, supple, symmetrical, trachea midline, and thyroid not enlarged, symmetric, no tenderness/mass/nodules Lymph nodes: Cervical, supraclavicular, and axillary nodes normal. Resp: clear to auscultation bilaterally Back: symmetric, no curvature. ROM normal. No CVA tenderness. Cardio: regular rate and rhythm, S1, S2 normal, no murmur, click, rub or gallop GI: soft, non-tender; bowel sounds normal; no masses,  no organomegaly Extremities: extremities normal, atraumatic, no cyanosis or edema Neurologic: Alert and oriented X 3, normal strength and tone. Normal symmetric reflexes. Normal coordination and gait  ECOG PERFORMANCE STATUS: 1 - Symptomatic but completely ambulatory  Blood pressure 124/67, pulse 71, temperature 97.8 F (36.6 C), temperature source Temporal, resp. rate 18, height 5\' 9"  (1.753 m), weight 188 lb 9.6 oz (85.5 kg), SpO2 98 %.  LABORATORY DATA: Lab Results  Component Value Date   WBC 8.3 03/28/2023   HGB 12.1 (L) 03/28/2023   HCT 36.2 (L) 03/28/2023   MCV 88.5 03/28/2023   PLT 264 03/28/2023      Chemistry      Component Value Date/Time   NA 140 03/07/2023 1109   K 4.2 03/07/2023 1109   CL 107 03/07/2023 1109   CO2 27 03/07/2023 1109   BUN 17 03/07/2023 1109   CREATININE 1.40 (H) 03/26/2023 0747   CREATININE 1.26 (H) 03/07/2023 1109      Component Value Date/Time   CALCIUM 9.5 03/07/2023 1109   ALKPHOS 102 03/07/2023 1109   AST 11 (L) 03/07/2023 1109   ALT 12 03/07/2023 1109   BILITOT 0.3 03/07/2023 1109  RADIOGRAPHIC STUDIES: CT CHEST ABDOMEN PELVIS W CONTRAST  Result Date: 03/27/2023 CLINICAL DATA:  History of lung cancer diagnosed in 2022 with radiation therapy complete. Immunotherapy in progress. Cough and shortness of breath. Left lower lobe primary. * Tracking Code: BO * EXAM: CT CHEST, ABDOMEN, AND PELVIS WITH CONTRAST TECHNIQUE: Multidetector CT imaging of the  chest, abdomen and pelvis was performed following the standard protocol during bolus administration of intravenous contrast. RADIATION DOSE REDUCTION: This exam was performed according to the departmental dose-optimization program which includes automated exposure control, adjustment of the mA and/or kV according to patient size and/or use of iterative reconstruction technique. CONTRAST:  60mL OMNIPAQUE IOHEXOL 300 MG/ML  SOLN COMPARISON:  01/21/2023 FINDINGS: CT CHEST FINDINGS Cardiovascular: Right Port-A-Cath tip low SVC. Aortic atherosclerosis. Tortuous thoracic aorta. Normal heart size, without pericardial effusion. No central pulmonary embolism, on this non-dedicated study. Mediastinum/Nodes: No supraclavicular adenopathy. No mediastinal or hilar adenopathy. Lungs/Pleura: Small left pleural effusion with pleural thickening and minimal anterior loculation, similar. Moderate centrilobular emphysema. Radiation induced volume loss, architectural distortion, and traction bronchiectasis in the paramediastinal left lung. No local recurrence. Right lower lobe scarring. Minimal nodularity along the right minor fissure of 2 mm on 79/6, similar. Lateral right lower lobe 4 mm nodule on 98/6 is unchanged. The more inferior and medial subpleural right lower lobe nodularity is less well-defined today, with only interstitial thickening identified on 130/6. No progressive nodularity. Musculoskeletal: No acute osseous abnormality. CT ABDOMEN PELVIS FINDINGS Hepatobiliary: Nonspecific caudate lobe enlargement. No suspicious liver lesion or biliary abnormality. Pancreas: Normal, without mass or ductal dilatation. Spleen: Normal in size, without focal abnormality. Adrenals/Urinary Tract: Normal adrenal glands. Left renal upper pole 1.5 cm lesion is most consistent with a cyst or minimally complex cyst . In the absence of clinically indicated signs/symptoms require(s) no independent follow-up. No hydronephrosis. Normal right kidney.  Normal urinary bladder. Stomach/Bowel: Normal stomach, without wall thickening. Large amount of stool in the rectum. Scattered colonic diverticula. Normal terminal ileum and appendix. Normal small bowel. Vascular/Lymphatic: Aortic atherosclerosis. Mildly prominent gastrohepatic ligament nodes are unchanged, favored to be reactive. Not pathologic by size criteria. No pelvic sidewall adenopathy. Reproductive: Normal prostate. Other: No significant free fluid. No free intraperitoneal air. No evidence of omental or peritoneal disease. Musculoskeletal: Transitional S1 vertebral body. IMPRESSION: 1. Similar radiation change about the medial left lung. No evidence of local recurrence or metastatic disease. 2. Similar and decrease in right lower lobe nodularity, favored to be postinfectious/inflammatory. 3. Similar small left pleural effusion with pleural thickening and minimal anterior loculation. 4. Aortic atherosclerosis (ICD10-I70.0) and emphysema (ICD10-J43.9). 5. Large amount of stool in the rectum suggests constipation. Electronically Signed   By: Jeronimo Greaves M.D.   On: 03/27/2023 11:09    ASSESSMENT AND PLAN: This is a very pleasant 47 years old white male recently diagnosed with a stage IV (T2b, N2, M1 B) non-small cell lung cancer, adenocarcinoma presented with large central left lower lobe lung mass in addition to left hilar and mediastinal lymphadenopathy as well as retroperitoneal abdominal lymph node diagnosed in May 2022.  The patient is currently undergoing palliative radiotherapy to the large left lower lobe lung mass under the care of of Dr. Mitzi Hansen.  He is expected to complete this course of treatment on June 12, 2021. MRI of the brain showed no evidence of metastatic disease to the brain. His molecular studies by [facility] showed no actionable mutations and PD-L1 expression of 50%. He is currently undergoing systemic chemotherapy with carboplatin for AUC  of 5, Alimta 500 Mg/M2 and Keytruda 200 Mg IV  every 3 weeks.  Starting from cycle #5 he is on maintenance treatment with Alimta at 400 Mg/M2 and Keytruda 200 Mg IV every 3 weeks status post 31 cycles.  He is currently on single agent Keytruda secondary to renal insufficiency. The patient has been tolerating this treatment fairly well with no concerning adverse effects. He had repeat CT scan of the chest, abdomen and pelvis performed recently.  I personally and independently reviewed the scan and discussed results with the patient today. His scan showed no concerning findings for disease progression or metastasis. I recommended for him to continue his current maintenance treatment with [facility] and he will proceed with cycle #32 today. If he has no evidence for disease progression after completion of 2 years of this treatment, we will discontinue the maintenance immunotherapy and monitor him closely after cycle #35. For the renal insufficiency, he is feeling better and followed by nephrology. For the anemia of chronic disease, he will continue his current treatment with iron tablets. The patient was advised to call immediately if he has any other concerning symptoms in the interval.  The patient voices understanding of current disease status and treatment options and is in agreement with the current care plan. All questions were answered. The patient knows to call the clinic with any problems, questions or concerns. We can certainly see the patient much sooner if necessary. The total time spent in the appointment was 30 minutes.  Disclaimer: This note was dictated with voice recognition software. Similar sounding words can inadvertently be transcribed and may not be corrected upon review.

## 2023-03-28 NOTE — Progress Notes (Signed)
OK'd w/ Dr. Arbutus Ped to d/c Folic Acid.  Ebony Hail, Pharm.D., CPP 03/28/2023@2 :45 PM

## 2023-03-28 NOTE — Progress Notes (Signed)
Patient seen by Dr. Mohamed  Vitals are within treatment parameters.  Labs reviewed: and are within treatment parameters.  Per physician team, patient is ready for treatment and there are NO modifications to the treatment plan.  

## 2023-03-29 LAB — TSH: TSH: 2.744 u[IU]/mL (ref 0.350–4.500)

## 2023-04-02 ENCOUNTER — Other Ambulatory Visit: Payer: Self-pay

## 2023-04-18 ENCOUNTER — Inpatient Hospital Stay (HOSPITAL_BASED_OUTPATIENT_CLINIC_OR_DEPARTMENT_OTHER): Payer: 59 | Admitting: Internal Medicine

## 2023-04-18 ENCOUNTER — Encounter: Payer: Self-pay | Admitting: Medical Oncology

## 2023-04-18 ENCOUNTER — Encounter: Payer: Self-pay | Admitting: Internal Medicine

## 2023-04-18 ENCOUNTER — Inpatient Hospital Stay: Payer: 59 | Attending: Physician Assistant

## 2023-04-18 ENCOUNTER — Other Ambulatory Visit (HOSPITAL_COMMUNITY): Payer: Self-pay

## 2023-04-18 ENCOUNTER — Inpatient Hospital Stay: Payer: 59

## 2023-04-18 ENCOUNTER — Other Ambulatory Visit: Payer: 59

## 2023-04-18 ENCOUNTER — Other Ambulatory Visit: Payer: Self-pay

## 2023-04-18 ENCOUNTER — Encounter: Payer: Self-pay | Admitting: Physician Assistant

## 2023-04-18 VITALS — BP 132/67 | HR 68 | Resp 16

## 2023-04-18 DIAGNOSIS — C3432 Malignant neoplasm of lower lobe, left bronchus or lung: Secondary | ICD-10-CM

## 2023-04-18 DIAGNOSIS — Z5112 Encounter for antineoplastic immunotherapy: Secondary | ICD-10-CM

## 2023-04-18 DIAGNOSIS — Z79620 Long term (current) use of immunosuppressive biologic: Secondary | ICD-10-CM | POA: Diagnosis not present

## 2023-04-18 DIAGNOSIS — Z95828 Presence of other vascular implants and grafts: Secondary | ICD-10-CM

## 2023-04-18 LAB — CBC WITH DIFFERENTIAL (CANCER CENTER ONLY)
Abs Immature Granulocytes: 0.04 10*3/uL (ref 0.00–0.07)
Basophils Absolute: 0.1 10*3/uL (ref 0.0–0.1)
Basophils Relative: 1 %
Eosinophils Absolute: 0.4 10*3/uL (ref 0.0–0.5)
Eosinophils Relative: 6 %
HCT: 39.1 % (ref 39.0–52.0)
Hemoglobin: 12.8 g/dL — ABNORMAL LOW (ref 13.0–17.0)
Immature Granulocytes: 1 %
Lymphocytes Relative: 13 %
Lymphs Abs: 0.9 10*3/uL (ref 0.7–4.0)
MCH: 28.9 pg (ref 26.0–34.0)
MCHC: 32.7 g/dL (ref 30.0–36.0)
MCV: 88.3 fL (ref 80.0–100.0)
Monocytes Absolute: 0.7 10*3/uL (ref 0.1–1.0)
Monocytes Relative: 10 %
Neutro Abs: 4.8 10*3/uL (ref 1.7–7.7)
Neutrophils Relative %: 69 %
Platelet Count: 257 10*3/uL (ref 150–400)
RBC: 4.43 MIL/uL (ref 4.22–5.81)
RDW: 13.9 % (ref 11.5–15.5)
WBC Count: 6.8 10*3/uL (ref 4.0–10.5)
nRBC: 0 % (ref 0.0–0.2)

## 2023-04-18 LAB — CMP (CANCER CENTER ONLY)
ALT: 14 U/L (ref 0–44)
AST: 13 U/L — ABNORMAL LOW (ref 15–41)
Albumin: 3.9 g/dL (ref 3.5–5.0)
Alkaline Phosphatase: 101 U/L (ref 38–126)
Anion gap: 5 (ref 5–15)
BUN: 15 mg/dL (ref 6–20)
CO2: 27 mmol/L (ref 22–32)
Calcium: 8.9 mg/dL (ref 8.9–10.3)
Chloride: 108 mmol/L (ref 98–111)
Creatinine: 1.29 mg/dL — ABNORMAL HIGH (ref 0.61–1.24)
GFR, Estimated: >60 mL/min (ref >60)
Glucose, Bld: 94 mg/dL (ref 70–99)
Potassium: 4.3 mmol/L (ref 3.5–5.1)
Sodium: 140 mmol/L (ref 135–145)
Total Bilirubin: 0.3 mg/dL (ref 0.3–1.2)
Total Protein: 7.2 g/dL (ref 6.5–8.1)

## 2023-04-18 LAB — TSH: TSH: 1.855 u[IU]/mL (ref 0.350–4.500)

## 2023-04-18 MED ORDER — SODIUM CHLORIDE 0.9 % IV SOLN: Freq: Once | INTRAVENOUS | Status: AC

## 2023-04-18 MED ORDER — TRIAMCINOLONE ACETONIDE 0.1 % EX CREA
1.0000 | TOPICAL_CREAM | Freq: Two times a day (BID) | CUTANEOUS | 0 refills | Status: AC | PRN
Start: 2023-04-18 — End: ?
  Filled 2023-04-18: qty 30, 15d supply, fill #0

## 2023-04-18 MED ORDER — SODIUM CHLORIDE 0.9% FLUSH
10.0000 mL | Freq: Once | INTRAVENOUS | Status: AC
  Administered 2023-04-18: 10 mL

## 2023-04-18 MED ORDER — FOLIC ACID 1 MG PO TABS
1.0000 mg | ORAL_TABLET | Freq: Every day | ORAL | 1 refills | Status: DC
  Filled 2023-04-18: qty 30, 30d supply, fill #0

## 2023-04-18 MED ORDER — SODIUM CHLORIDE 0.9 % IV SOLN
200.0000 mg | Freq: Once | INTRAVENOUS | Status: AC
  Administered 2023-04-18: 200 mg via INTRAVENOUS
  Filled 2023-04-18: qty 200

## 2023-04-18 MED ORDER — SODIUM CHLORIDE 0.9% FLUSH
10.0000 mL | INTRAVENOUS | Status: DC | PRN
  Administered 2023-04-18: 10 mL

## 2023-04-18 MED ORDER — HEPARIN SOD (PORK) LOCK FLUSH 100 UNIT/ML IV SOLN
500.0000 [IU] | Freq: Once | INTRAVENOUS | Status: AC | PRN
  Administered 2023-04-18: 500 [IU]

## 2023-04-18 NOTE — Patient Instructions (Signed)
[facility]  Discharge Instructions: Thank you for choosing [facility] to provide your oncology and hematology care.   If you have a lab appointment with the Cancer Center, please go directly to the Cancer Center and check in at the registration area.   Wear comfortable clothing and clothing appropriate for easy access to any Portacath or PICC line.   We strive to give you quality time with your provider. You may need to reschedule your appointment if you arrive late (15 or more minutes).  Arriving late affects you and other patients whose appointments are after yours.  Also, if you miss three or more appointments without notifying the office, you may be dismissed from the clinic at the provider's discretion.      For prescription refill requests, have your pharmacy contact our office and allow 72 hours for refills to be completed.    Today you received the following chemotherapy and/or immunotherapy agents: Keytruda      To help prevent nausea and vomiting after your treatment, we encourage you to take your nausea medication as directed.  BELOW ARE SYMPTOMS THAT SHOULD BE REPORTED IMMEDIATELY: *FEVER GREATER THAN 100.4 F (38 C) OR HIGHER *CHILLS OR SWEATING *NAUSEA AND VOMITING THAT IS NOT CONTROLLED WITH YOUR NAUSEA MEDICATION *UNUSUAL SHORTNESS OF BREATH *UNUSUAL BRUISING OR BLEEDING *URINARY PROBLEMS (pain or burning when urinating, or frequent urination) *BOWEL PROBLEMS (unusual diarrhea, constipation, pain near the anus) TENDERNESS IN MOUTH AND THROAT WITH OR WITHOUT PRESENCE OF ULCERS (sore throat, sores in mouth, or a toothache) UNUSUAL RASH, SWELLING OR PAIN  UNUSUAL VAGINAL DISCHARGE OR ITCHING   Items with * indicate a potential emergency and should be followed up as soon as possible or go to the Emergency Department if any problems should occur.  Please show the CHEMOTHERAPY ALERT CARD or IMMUNOTHERAPY ALERT CARD at  check-in to the Emergency Department and triage nurse.  Should you have questions after your visit or need to cancel or reschedule your appointment, please contact [facility]  Dept: [phone]  and follow the prompts.  Office hours are 8:00 a.m. to 4:30 p.m. Monday - Friday. Please note that voicemails left after 4:00 p.m. may not be returned until the following business day.  We are closed weekends and major holidays. You have access to a nurse at all times for urgent questions. Please call the main number to the clinic Dept: [phone] and follow the prompts.   For any non-urgent questions, you may also contact your provider using MyChart. We now offer e-Visits for anyone 18 and older to request care online for non-urgent symptoms. For details visit [email or web address].   Also download the MyChart app! Go to the app store, search "MyChart", open the app, select [facility], and log in with your MyChart username and password.   

## 2023-04-18 NOTE — Progress Notes (Signed)
[facility] [phone]   [phone]  OFFICE PROGRESS NOTE  [name], MD [address] [place] [address]  DIAGNOSIS:  Stage IV (T2b, N2, M1 B) non-small cell lung cancer, adenocarcinoma presented with large central left lower lobe lung mass with left hilar and mediastinal lymphadenopathy as well as abdominal retroperitoneal lymphadenopathy diagnosed in May 2022.  The patient also has bilateral parotid gland nodules that need close monitoring.  [facility] MOLECULAR STUDY: Results with Therapy Associations BIOMARKER METHOD ANALYTE RESULT THERAPY ASSOCIATION BIOMARKER LEVEL* .PD-L1 (22c3) IHC Protein Positive, TPS: 50% BENEFIT cemiplimab, pembrolizumab Level 1 .PD-L1 (28-8) IHC Protein Positive  1+, 50% BENEFIT nivolumab/ipilimumab combination Level 1 .TMB Seq DNA-Tumor High, 18 mut/Mb BENEFIT pembrolizumab Level 2 . alectinib, ceritinib, crizotinib, lorlatinib Level 1 . IHC Protein Negative  0 brigatinib Level 2 . ALK Seq RNA-Tumor Fusion Not Detected LACK OF BENEFIT alectinib, brigatinib, ceritinib, crizotinib, lorlatinib Level 2 .BRAF Seq DNA-Tumor Mutation Not Detected LACK OF BENEFIT dabrafenib and trametinib combination therapy, vemurafenib Level 2 .EGFR Seq DNA-Tumor Mutation Not Detected LACK OF BENEFIT erlotinib, gefitinib Level 2 .KRAS Seq DNA-Tumor Mutation Not Detected LACK OF BENEFIT sotorasib Level 2 .RET Seq RNA-Tumor Fusion Not Detected LACK OF BENEFIT pralsetinib, selpercatinib Level 2 .ROS1 Seq RNA-Tumor Fusion Not Detected LACK OF BENEFIT ceritinib, crizotinib, entrectinib, lorlatinib Level 2 . CNA-Seq DNA-Tumor Amplification Not Detected .MET Seq RNA-Tumor Variant Transcript Not Detected LACK OF BENEFIT crizotinib Level 3  PRIOR THERAPY: Palliative radiotherapy to the large central left lower lobe lung mass under the care of Dr. Mitzi Hansen.  CURRENT THERAPY: Systemic chemotherapy with carboplatin for AUC  of 5, Alimta 500 Mg/M2 and possibly Keytruda 200 Mg IV every 3 weeks.  First dose expected June 14, 2021.  Starting from cycle #5 the patient is on maintenance treatment with Alimta and Keytruda every 3 weeks.  Status post 32 cycles of treatment.  Starting from cycle #12 the dose of Alimta to 400 Mg/M2 because of his increasing fatigue and side effects.  Alimta was discontinued secondary to renal insufficiency.  INTERVAL HISTORY: Cameron Harper 47 y.o. male returns to the clinic today for follow-up visit.  The patient is feeling fine today with no concerning complaints.  He denied having any chest pain but has occasional shortness of breath with exertion with no cough or hemoptysis.  He has no nausea, vomiting, diarrhea or constipation.  He has no headache or visual changes.  He denied having any significant weight loss or night sweats.  He continues to tolerate his treatment with Keytruda fairly well.  He is here today for evaluation before starting cycle #33.  MEDICAL HISTORY: Past Medical History:  Diagnosis Date   Chronic back pain    Eczema    GERD (gastroesophageal reflux disease)    Lung cancer (HCC)     ALLERGIES:  is allergic to penicillins and zofran [ondansetron].  MEDICATIONS:  Current Outpatient Medications  Medication Sig Dispense Refill   acetaminophen (TYLENOL) 500 MG tablet Take 500-1,000 mg by mouth every 6 (six) hours as needed (for back pain.).     ferrous sulfate 325 (65 FE) MG tablet Take 325 mg by mouth daily with breakfast.     lidocaine-prilocaine (EMLA) cream Apply 1 application topically as needed. 30 g 2   prochlorperazine (COMPAZINE) 10 MG tablet Take 1 tablet (10 mg total) by mouth every 6 (six) hours as needed for nausea or vomiting. 30 tablet 0   triamcinolone cream (KENALOG) 0.1 %  Apply 1 Application topically 2 (two) times daily as needed. 30 g 0   No current facility-administered medications for this visit.    SURGICAL HISTORY:  Past Surgical  History:  Procedure Laterality Date   BRONCHIAL BRUSHINGS  05/12/2021   Procedure: BRONCHIAL BRUSHINGS;  Surgeon: Josephine Igo, DO;  Location: [facility];  Service: Pulmonary;;   BRONCHIAL DILITATION  05/12/2021   Procedure: BRONCHIAL DILITATION;  Surgeon: Josephine Igo, DO;  Location: [facility];  Service: Pulmonary;;   BRONCHIAL NEEDLE ASPIRATION BIOPSY  05/12/2021   Procedure: BRONCHIAL NEEDLE ASPIRATION BIOPSIES;  Surgeon: Josephine Igo, DO;  Location: [facility];  Service: Pulmonary;;   BRONCHIAL WASHINGS  05/12/2021   Procedure: BRONCHIAL WASHINGS;  Surgeon: Josephine Igo, DO;  Location: [facility];  Service: Pulmonary;;   CRYOTHERAPY  05/12/2021   Procedure: CRYOTHERAPY;  Surgeon: Josephine Igo, DO;  Location: [facility];  Service: Pulmonary;;   HEMOSTASIS CONTROL  05/12/2021   Procedure: HEMOSTASIS CONTROL;  Surgeon: Josephine Igo, DO;  Location: [facility];  Service: Pulmonary;;  epi injection   IR IMAGING GUIDED PORT INSERTION  06/29/2021   Spine injection     Pain control   VIDEO BRONCHOSCOPY WITH ENDOBRONCHIAL ULTRASOUND N/A 05/12/2021   Procedure: VIDEO BRONCHOSCOPY WITH ENDOBRONCHIAL ULTRASOUND;  Surgeon: Josephine Igo, DO;  Location: [facility];  Service: Pulmonary;  Laterality: N/A;   WISDOM TOOTH EXTRACTION     no anesthesia involed    REVIEW OF SYSTEMS:  A comprehensive review of systems was negative.   PHYSICAL EXAMINATION: General appearance: alert, cooperative, and no distress Head: Normocephalic, without obvious abnormality, atraumatic Neck: no adenopathy, no JVD, supple, symmetrical, trachea midline, and thyroid not enlarged, symmetric, no tenderness/mass/nodules Lymph nodes: Cervical, supraclavicular, and axillary nodes normal. Resp: clear to auscultation bilaterally Back: symmetric, no curvature. ROM normal. No CVA tenderness. Cardio: regular rate and rhythm, S1, S2 normal, no murmur, click, rub or gallop GI: soft, non-tender; bowel  sounds normal; no masses,  no organomegaly Extremities: extremities normal, atraumatic, no cyanosis or edema  ECOG PERFORMANCE STATUS: 0 - Asymptomatic  Blood pressure 99/63, pulse 67, temperature (!) 97.3 F (36.3 C), temperature source Temporal, resp. rate 14, weight 187 lb (84.8 kg), SpO2 100 %.  LABORATORY DATA: Lab Results  Component Value Date   WBC 6.8 04/18/2023   HGB 12.8 (L) 04/18/2023   HCT 39.1 04/18/2023   MCV 88.3 04/18/2023   PLT 257 04/18/2023      Chemistry      Component Value Date/Time   NA 141 03/28/2023 1304   K 3.9 03/28/2023 1304   CL 108 03/28/2023 1304   CO2 28 03/28/2023 1304   BUN 16 03/28/2023 1304   CREATININE 1.34 (H) 03/28/2023 1304      Component Value Date/Time   CALCIUM 9.0 03/28/2023 1304   ALKPHOS 106 03/28/2023 1304   AST 12 (L) 03/28/2023 1304   ALT 13 03/28/2023 1304   BILITOT 0.2 (L) 03/28/2023 1304       RADIOGRAPHIC STUDIES: CT CHEST ABDOMEN PELVIS W CONTRAST  Result Date: 03/27/2023 CLINICAL DATA:  History of lung cancer diagnosed in 2022 with radiation therapy complete. Immunotherapy in progress. Cough and shortness of breath. Left lower lobe primary. * Tracking Code: BO * EXAM: CT CHEST, ABDOMEN, AND PELVIS WITH CONTRAST TECHNIQUE: Multidetector CT imaging of the chest, abdomen and pelvis was performed following the standard protocol during bolus administration of intravenous contrast. RADIATION DOSE REDUCTION: This exam was performed according to the  departmental dose-optimization program which includes automated exposure control, adjustment of the mA and/or kV according to patient size and/or use of iterative reconstruction technique. CONTRAST:  60mL OMNIPAQUE IOHEXOL 300 MG/ML  SOLN COMPARISON:  01/21/2023 FINDINGS: CT CHEST FINDINGS Cardiovascular: Right Port-A-Cath tip low SVC. Aortic atherosclerosis. Tortuous thoracic aorta. Normal heart size, without pericardial effusion. No central pulmonary embolism, on this non-dedicated  study. Mediastinum/Nodes: No supraclavicular adenopathy. No mediastinal or hilar adenopathy. Lungs/Pleura: Small left pleural effusion with pleural thickening and minimal anterior loculation, similar. Moderate centrilobular emphysema. Radiation induced volume loss, architectural distortion, and traction bronchiectasis in the paramediastinal left lung. No local recurrence. Right lower lobe scarring. Minimal nodularity along the right minor fissure of 2 mm on 79/6, similar. Lateral right lower lobe 4 mm nodule on 98/6 is unchanged. The more inferior and medial subpleural right lower lobe nodularity is less well-defined today, with only interstitial thickening identified on 130/6. No progressive nodularity. Musculoskeletal: No acute osseous abnormality. CT ABDOMEN PELVIS FINDINGS Hepatobiliary: Nonspecific caudate lobe enlargement. No suspicious liver lesion or biliary abnormality. Pancreas: Normal, without mass or ductal dilatation. Spleen: Normal in size, without focal abnormality. Adrenals/Urinary Tract: Normal adrenal glands. Left renal upper pole 1.5 cm lesion is most consistent with a cyst or minimally complex cyst . In the absence of clinically indicated signs/symptoms require(s) no independent follow-up. No hydronephrosis. Normal right kidney. Normal urinary bladder. Stomach/Bowel: Normal stomach, without wall thickening. Large amount of stool in the rectum. Scattered colonic diverticula. Normal terminal ileum and appendix. Normal small bowel. Vascular/Lymphatic: Aortic atherosclerosis. Mildly prominent gastrohepatic ligament nodes are unchanged, favored to be reactive. Not pathologic by size criteria. No pelvic sidewall adenopathy. Reproductive: Normal prostate. Other: No significant free fluid. No free intraperitoneal air. No evidence of omental or peritoneal disease. Musculoskeletal: Transitional S1 vertebral body. IMPRESSION: 1. Similar radiation change about the medial left lung. No evidence of local  recurrence or metastatic disease. 2. Similar and decrease in right lower lobe nodularity, favored to be postinfectious/inflammatory. 3. Similar small left pleural effusion with pleural thickening and minimal anterior loculation. 4. Aortic atherosclerosis (ICD10-I70.0) and emphysema (ICD10-J43.9). 5. Large amount of stool in the rectum suggests constipation. Electronically Signed   By: Jeronimo Greaves M.D.   On: 03/27/2023 11:09    ASSESSMENT AND PLAN: This is a very pleasant 47 years old white male recently diagnosed with a stage IV (T2b, N2, M1 B) non-small cell lung cancer, adenocarcinoma presented with large central left lower lobe lung mass in addition to left hilar and mediastinal lymphadenopathy as well as retroperitoneal abdominal lymph node diagnosed in May 2022.  The patient is currently undergoing palliative radiotherapy to the large left lower lobe lung mass under the care of of Dr. Mitzi Hansen.  He is expected to complete this course of treatment on June 12, 2021. MRI of the brain showed no evidence of metastatic disease to the brain. His molecular studies by [facility] showed no actionable mutations and PD-L1 expression of 50%. He is currently undergoing systemic chemotherapy with carboplatin for AUC of 5, Alimta 500 Mg/M2 and Keytruda 200 Mg IV every 3 weeks.  Starting from cycle #5 he is on maintenance treatment with Alimta at 400 Mg/M2 and Keytruda 200 Mg IV every 3 weeks status post 32 cycles.  He is currently on single agent Keytruda secondary to renal insufficiency. The patient has been tolerating his treatment well with no concerning adverse effects. I recommended for him to proceed with cycle #33 today as planned. I will see him back for  follow-up visit in 3 weeks for evaluation before the next cycle of his treatment. The patient is followed by nephrology for his renal insufficiency. For the anemia he will continue with the oral iron tablets for now. He was advised to call immediately if he has any  other concerning symptoms in the interval.  The patient voices understanding of current disease status and treatment options and is in agreement with the current care plan. All questions were answered. The patient knows to call the clinic with any problems, questions or concerns. We can certainly see the patient much sooner if necessary. The total time spent in the appointment was 20 minutes.  Disclaimer: This note was dictated with voice recognition software. Similar sounding words can inadvertently be transcribed and may not be corrected upon review.

## 2023-04-18 NOTE — Progress Notes (Unsigned)
Patient seen by Dr. Mohamed  Vitals are within treatment parameters.  Labs reviewed: and are within treatment parameters.  Per physician team, patient is ready for treatment and there are NO modifications to the treatment plan.  

## 2023-04-20 LAB — T4: T4, Total: 8.7 ug/dL (ref 4.5–12.0)

## 2023-04-24 ENCOUNTER — Other Ambulatory Visit: Payer: Self-pay

## 2023-05-03 ENCOUNTER — Telehealth: Payer: Self-pay | Admitting: Internal Medicine

## 2023-05-03 NOTE — Telephone Encounter (Signed)
Called patient regarding upcoming June appointments, patient is notified.  

## 2023-05-04 NOTE — Progress Notes (Signed)
[facility] OFFICE PROGRESS NOTE  Cameron Found, MD [address] [place] [address]  DIAGNOSIS: Stage IV (T2b, N2, M1 B) non-small cell lung cancer, adenocarcinoma presented with large central left lower lobe lung mass with left hilar and mediastinal lymphadenopathy as well as abdominal retroperitoneal lymphadenopathy diagnosed in May 2022.  The patient also has bilateral parotid gland nodules that need close monitoring.   CARIS MOLECULAR STUDY: Results with Therapy Associations BIOMARKER METHOD ANALYTE RESULT THERAPY ASSOCIATION BIOMARKER LEVEL* .PD-L1 (22c3) IHC Protein Positive, TPS: 50% BENEFIT cemiplimab, pembrolizumab Level 1 .PD-L1 (28-8) IHC Protein Positive  1+, 50% BENEFIT nivolumab/ipilimumab combination Level 1 .TMB Seq DNA-Tumor High, 18 mut/Mb BENEFIT pembrolizumab Level 2 . alectinib, ceritinib, crizotinib, lorlatinib Level 1 . IHC Protein Negative  0 brigatinib Level 2 . ALK Seq RNA-Tumor Fusion Not Detected LACK OF BENEFIT alectinib, brigatinib, ceritinib, crizotinib, lorlatinib Level 2 .BRAF Seq DNA-Tumor Mutation Not Detected LACK OF BENEFIT dabrafenib and trametinib combination therapy, vemurafenib Level 2 .EGFR Seq DNA-Tumor Mutation Not Detected LACK OF BENEFIT erlotinib, gefitinib Level 2 .KRAS Seq DNA-Tumor Mutation Not Detected LACK OF BENEFIT sotorasib Level 2 .RET Seq RNA-Tumor Fusion Not Detected LACK OF BENEFIT pralsetinib, selpercatinib Level 2 .ROS1 Seq RNA-Tumor Fusion Not Detected LACK OF BENEFIT ceritinib, crizotinib, entrectinib, lorlatinib Level 2 . CNA-Seq DNA-Tumor Amplification Not Detected . MET Seq RNA-Tumor Variant Transcript Not Detected LACK OF BENEFIT crizotinib Level 3  PRIOR THERAPY: Palliative radiotherapy to the large central left lower lobe lung mass under the care of Dr. Mitzi Hansen. Last treatment on 06/12/21.     CURRENT THERAPY: Systemic chemotherapy with carboplatin for AUC of 5, Alimta 500  Mg/M2 and Keytruda 200 Mg IV every 3 weeks.  First dose expected June 14, 2021.  Status post 33 cycles of treatment.  Starting from cycle #5, the patient will begin maintenance Keytruda and Alimta.  Alimta was reduced to 400 mg per metered squared starting from cycle number 23 due to fatigue.  Alimta discontinued starting from cycle #13 due to renal insufficiency       INTERVAL HISTORY: Cameron Harper 47 y.o. male returns to the clinic today for a follow-up visit.  The patient was last seen 3 weeks ago by Dr. Arbutus Ped.  The patient is feeling fairly well today without any concerning complaints except mentally/emotionally it is difficult for him to have limitations with his breathing. He has similar dyspnea on exertion and the need to rest when he is active. Sometimes if he is active, he feels like there is "something" in his lung. He states this is not new and has been occurring for sometime, including since before his last scan, which showed stable disease. His last scan does show stable/chronic pleural thickening of the left lung, volume loss, moderate centrilobular emphysema, and radiation architectural distortion. He also knows that his breathing does worsen in the heat. He previously saw Dr. Sherene Sires from pulmonary medicine.   He is currently undergoing single agent immunotherapy with Keytruda.  Alimta was discontinued secondary to elevated creatinine and fatigue.  He follows closely with nephrology.  Today he denies any fever, chills, night sweats, or unexplained weight loss. Denies any chest pain or hemoptysis. He denies significant cough. He sometimes may have a mild cough secondary to congestion. He does not take an antihistamine. Denies any nausea, vomiting, diarrhea, or constipation.  Denies any headache or visual changes. Denies any rashes. He sometimes has dry skin due to eczema for which he uses kenalog cream. Overall, his eczema  has been better recently.  He is here today for evaluation before  starting cycle #34.    MEDICAL HISTORY: Past Medical History:  Diagnosis Date   Chronic back pain    Eczema    GERD (gastroesophageal reflux disease)    Lung cancer (HCC)     ALLERGIES:  is allergic to penicillins and zofran [ondansetron].  MEDICATIONS:  Current Outpatient Medications  Medication Sig Dispense Refill   acetaminophen (TYLENOL) 500 MG tablet Take 500-1,000 mg by mouth every 6 (six) hours as needed (for back pain.).     ferrous sulfate 325 (65 FE) MG tablet Take 325 mg by mouth daily with breakfast.     lidocaine-prilocaine (EMLA) cream Apply 1 application topically as needed. 30 g 2   prochlorperazine (COMPAZINE) 10 MG tablet Take 1 tablet (10 mg total) by mouth every 6 (six) hours as needed for nausea or vomiting. 30 tablet 0   triamcinolone cream (KENALOG) 0.1 % Apply 1 Application topically 2 (two) times daily as needed. 30 g 0   No current facility-administered medications for this visit.    SURGICAL HISTORY:  Past Surgical History:  Procedure Laterality Date   BRONCHIAL BRUSHINGS  05/12/2021   Procedure: BRONCHIAL BRUSHINGS;  Surgeon: Josephine Igo, DO;  [facility]: [facility];  Service: Pulmonary;;   BRONCHIAL DILITATION  05/12/2021   Procedure: BRONCHIAL DILITATION;  Surgeon: Josephine Igo, DO;  [facility];  Service: Pulmonary;;   BRONCHIAL NEEDLE ASPIRATION BIOPSY  05/12/2021   Procedure: BRONCHIAL NEEDLE ASPIRATION BIOPSIES;  Surgeon: Josephine Igo, DO;  [facility];  Service: Pulmonary;;   BRONCHIAL WASHINGS  05/12/2021   Procedure: BRONCHIAL WASHINGS;  Surgeon: Josephine Igo, DO;  [facility];  Service: Pulmonary;;   CRYOTHERAPY  05/12/2021   Procedure: CRYOTHERAPY;  Surgeon: Josephine Igo, DO;  [facility];  Service: Pulmonary;;   HEMOSTASIS CONTROL  05/12/2021   Procedure: HEMOSTASIS CONTROL;  Surgeon: Josephine Igo, DO;  [facility];  Service: Pulmonary;;  epi injection   IR  IMAGING GUIDED PORT INSERTION  06/29/2021   Spine injection     Pain control   VIDEO BRONCHOSCOPY WITH ENDOBRONCHIAL ULTRASOUND N/A 05/12/2021   Procedure: VIDEO BRONCHOSCOPY WITH ENDOBRONCHIAL ULTRASOUND;  Surgeon: Josephine Igo, DO;  [facility];  Service: Pulmonary;  Laterality: N/A;   WISDOM TOOTH EXTRACTION     no anesthesia involed    REVIEW OF SYSTEMS:   Constitutional: Negative for appetite change, chills, fever and unexpected weight change.  HENT: Negative for mouth sores, nosebleeds, sore throat and trouble swallowing.  Eyes: Negative for eye problems and icterus.  Respiratory: Positive for shortness of breath with strenuous activity (unchanged). Negative for cough, hemoptysis, and wheezing.   Cardiovascular: Negative for chest pain and leg swelling.  Gastrointestinal: Negative for abdominal pain, constipation, diarrhea, nausea and vomiting.  Genitourinary: Negative for bladder incontinence, difficulty urinating, dysuria, frequency and hematuria.   Musculoskeletal: Negative for back pain, gait problem, neck pain and neck stiffness.  Skin: Positive for eczema (improving compared to prior)   Neurological: Negative for dizziness, extremity weakness, gait problem, headaches, light-headedness and seizures.  Hematological: Negative for adenopathy. Does not bruise/bleed easily.  Psychiatric/Behavioral: Negative for confusion, depression and sleep disturbance. The patient is not nervous/anxious     PHYSICAL EXAMINATION:  There were no vitals taken for this visit.  ECOG PERFORMANCE STATUS: 1  Physical Exam  Constitutional: Oriented to person, place, and time and well-developed, well-nourished, and in no distress.  HENT:  Head: Normocephalic and atraumatic.  Mouth/Throat: Oropharynx is clear and moist. No oropharyngeal exudate.  Eyes: Conjunctivae are normal. Right eye exhibits no discharge. Left eye exhibits no discharge. No scleral icterus.  Neck: Normal range of  motion. Neck supple.  Cardiovascular: Normal rate, regular rhythm, normal heart sounds and intact distal pulses.   Pulmonary/Chest: Effort normal and breath sounds normal. No respiratory distress. No wheezes. No rales.  Abdominal: Soft. Bowel sounds are normal. Exhibits no distension and no mass. There is no tenderness.  Musculoskeletal: Normal range of motion. Exhibits no edema.  Lymphadenopathy:    No cervical adenopathy.  Neurological: Alert and oriented to person, place, and time. Exhibits normal muscle tone. Gait normal. Coordination normal.  Skin: Skin is warm and dry. No rash noted. Not diaphoretic. No erythema. No pallor.  Psychiatric: Mood, memory and judgment normal.  Vitals reviewed.  LABORATORY DATA: Lab Results  Component Value Date   WBC 6.8 04/18/2023   HGB 12.8 (L) 04/18/2023   HCT 39.1 04/18/2023   MCV 88.3 04/18/2023   PLT 257 04/18/2023      Chemistry      Component Value Date/Time   NA 140 04/18/2023 0941   K 4.3 04/18/2023 0941   CL 108 04/18/2023 0941   CO2 27 04/18/2023 0941   BUN 15 04/18/2023 0941   CREATININE 1.29 (H) 04/18/2023 0941      Component Value Date/Time   CALCIUM 8.9 04/18/2023 0941   ALKPHOS 101 04/18/2023 0941   AST 13 (L) 04/18/2023 0941   ALT 14 04/18/2023 0941   BILITOT 0.3 04/18/2023 0941       RADIOGRAPHIC STUDIES:  No results Harper.   ASSESSMENT/PLAN:  This is a very pleasant 47 year old Caucasian male diagnosed with stage IV (T2b, N2, M1 B) non-small cell lung cancer, adenocarcinoma.  He presented with a large central left lower lobe lung mass in addition to left hilar mediastinal lymphadenopathy.  He also has retroperitoneal abdominal lymphadenopathy.  He was diagnosed in May 2022.  His PD-L1 expression is 50%.  His molecular studies by [facility]IS did not show any evidence of any actionable mutations.  He underwent palliative radiotherapy to the large left lower lobe lung mass under the care of Dr. Mitzi Hansen.  He completed this  on 06/12/2021.   The patient is currently undergoing systemic chemotherapy with carboplatin for an AUC of 5, Alimta 500 mg per metered squared, and Keytruda 200 mg IV every 3 weeks.  He is status post 33 cycles.  Starting from cycle #5, the patient has been on maintenance Alimta and Keytruda.  The dose of Alimta was reduced to 400 mg per metered squared starting from cycle #12 due to fatigue.  Alimta was removed from the treatment plan starting from cycle #13 due to renal insufficiency.   Labs were reviewed.  Recommend that he proceed with cycle #34 today scheduled.   We will see him back for follow-up visit in 3 weeks for evaluation and repeat blood work before starting cycle #35.   He will need one more scan after his next cycle of treatment. We had discussed this today as the patient had some questions about next steps.  The patient has limitations on his activities due to his dyspnea. This is stable since his diagnosis and does worsen in the heat every summer, but having to take breaks during his activity is unsettling to him. I gave him an incentive spirometer to perform exercises. We did review and I showed him images  from his last scan of his lungs and why he has had limitations in his breathing, even though from a cancer standpoint his disease has been stable for some time. He has some chronic plural thickening, small volume of fluid, emphysema, left sided volume loss, and radiation changes. Encouraged him to talk to his pulmonologist to see if they had additional recommendations to maximize his breathing, such as if he would be a candidate for pulmonary rehab, inhalers, etc.   The patient was advised to call immediately if he has any concerning symptoms in the interval. The patient voices understanding of current disease status and treatment options and is in agreement with the current care plan. All questions were answered. The patient knows to call the clinic with any problems, questions or  concerns. We can certainly see the patient much sooner if necessary            No orders of the defined types were placed in this encounter.    The total time spent in the appointment was 20-29 minutes.   Rylei Masella L Annalisa Colonna, PA-C 05/04/23

## 2023-05-05 ENCOUNTER — Other Ambulatory Visit: Payer: Self-pay

## 2023-05-09 ENCOUNTER — Inpatient Hospital Stay: Payer: 59 | Admitting: Internal Medicine

## 2023-05-09 ENCOUNTER — Inpatient Hospital Stay: Payer: 59

## 2023-05-10 ENCOUNTER — Inpatient Hospital Stay (HOSPITAL_BASED_OUTPATIENT_CLINIC_OR_DEPARTMENT_OTHER): Payer: 59 | Admitting: Physician Assistant

## 2023-05-10 ENCOUNTER — Other Ambulatory Visit: Payer: Self-pay

## 2023-05-10 ENCOUNTER — Inpatient Hospital Stay: Payer: 59

## 2023-05-10 ENCOUNTER — Inpatient Hospital Stay: Payer: 59 | Attending: Physician Assistant

## 2023-05-10 VITALS — BP 110/72 | HR 67 | Temp 97.7°F | Resp 16 | Wt 187.6 lb

## 2023-05-10 DIAGNOSIS — C3432 Malignant neoplasm of lower lobe, left bronchus or lung: Secondary | ICD-10-CM | POA: Diagnosis not present

## 2023-05-10 DIAGNOSIS — Z5112 Encounter for antineoplastic immunotherapy: Secondary | ICD-10-CM | POA: Insufficient documentation

## 2023-05-10 DIAGNOSIS — Z79620 Long term (current) use of immunosuppressive biologic: Secondary | ICD-10-CM | POA: Insufficient documentation

## 2023-05-10 DIAGNOSIS — Z95828 Presence of other vascular implants and grafts: Secondary | ICD-10-CM

## 2023-05-10 LAB — CBC WITH DIFFERENTIAL (CANCER CENTER ONLY)
Abs Immature Granulocytes: 0.02 10*3/uL (ref 0.00–0.07)
Basophils Absolute: 0.1 10*3/uL (ref 0.0–0.1)
Basophils Relative: 1 %
Eosinophils Absolute: 0.4 10*3/uL (ref 0.0–0.5)
Eosinophils Relative: 6 %
HCT: 39 % (ref 39.0–52.0)
Hemoglobin: 12.5 g/dL — ABNORMAL LOW (ref 13.0–17.0)
Immature Granulocytes: 0 %
Lymphocytes Relative: 16 %
Lymphs Abs: 1 10*3/uL (ref 0.7–4.0)
MCH: 28.3 pg (ref 26.0–34.0)
MCHC: 32.1 g/dL (ref 30.0–36.0)
MCV: 88.4 fL (ref 80.0–100.0)
Monocytes Absolute: 0.6 10*3/uL (ref 0.1–1.0)
Monocytes Relative: 9 %
Neutro Abs: 4.4 10*3/uL (ref 1.7–7.7)
Neutrophils Relative %: 68 %
Platelet Count: 271 10*3/uL (ref 150–400)
RBC: 4.41 MIL/uL (ref 4.22–5.81)
RDW: 13.8 % (ref 11.5–15.5)
WBC Count: 6.4 10*3/uL (ref 4.0–10.5)
nRBC: 0 % (ref 0.0–0.2)

## 2023-05-10 LAB — CMP (CANCER CENTER ONLY)
ALT: 15 U/L (ref 0–44)
AST: 14 U/L — ABNORMAL LOW (ref 15–41)
Albumin: 3.7 g/dL (ref 3.5–5.0)
Alkaline Phosphatase: 107 U/L (ref 38–126)
Anion gap: 5 (ref 5–15)
BUN: 18 mg/dL (ref 6–20)
CO2: 28 mmol/L (ref 22–32)
Calcium: 9.1 mg/dL (ref 8.9–10.3)
Chloride: 107 mmol/L (ref 98–111)
Creatinine: 1.42 mg/dL — ABNORMAL HIGH (ref 0.61–1.24)
GFR, Estimated: >60 mL/min (ref >60)
Glucose, Bld: 125 mg/dL — ABNORMAL HIGH (ref 70–99)
Potassium: 3.9 mmol/L (ref 3.5–5.1)
Sodium: 140 mmol/L (ref 135–145)
Total Bilirubin: 0.3 mg/dL (ref 0.3–1.2)
Total Protein: 7.2 g/dL (ref 6.5–8.1)

## 2023-05-10 LAB — TSH: TSH: 1.622 u[IU]/mL (ref 0.350–4.500)

## 2023-05-10 MED ORDER — SODIUM CHLORIDE 0.9% FLUSH
10.0000 mL | INTRAVENOUS | Status: DC | PRN
  Administered 2023-05-10: 10 mL

## 2023-05-10 MED ORDER — SODIUM CHLORIDE 0.9% FLUSH
10.0000 mL | Freq: Once | INTRAVENOUS | Status: AC
  Administered 2023-05-10: 10 mL

## 2023-05-10 MED ORDER — HEPARIN SOD (PORK) LOCK FLUSH 100 UNIT/ML IV SOLN
500.0000 [IU] | Freq: Once | INTRAVENOUS | Status: AC | PRN
  Administered 2023-05-10: 500 [IU]

## 2023-05-10 MED ORDER — SODIUM CHLORIDE 0.9 % IV SOLN: Freq: Once | INTRAVENOUS | Status: AC

## 2023-05-10 MED ORDER — SODIUM CHLORIDE 0.9 % IV SOLN
200.0000 mg | Freq: Once | INTRAVENOUS | Status: AC
  Administered 2023-05-10: 200 mg via INTRAVENOUS
  Filled 2023-05-10: qty 200

## 2023-05-10 NOTE — Patient Instructions (Signed)
[facility]  Discharge Instructions: Thank you for choosing [facility] to provide your oncology and hematology care.   If you have a lab appointment with the Cancer Center, please go directly to the Cancer Center and check in at the registration area.   Wear comfortable clothing and clothing appropriate for easy access to any Portacath or PICC line.   We strive to give you quality time with your provider. You may need to reschedule your appointment if you arrive late (15 or more minutes).  Arriving late affects you and other patients whose appointments are after yours.  Also, if you miss three or more appointments without notifying the office, you may be dismissed from the clinic at the provider's discretion.      For prescription refill requests, have your pharmacy contact our office and allow 72 hours for refills to be completed.    Today you received the following chemotherapy and/or immunotherapy agents: pembrolizumab      To help prevent nausea and vomiting after your treatment, we encourage you to take your nausea medication as directed.  BELOW ARE SYMPTOMS THAT SHOULD BE REPORTED IMMEDIATELY: *FEVER GREATER THAN 100.4 F (38 C) OR HIGHER *CHILLS OR SWEATING *NAUSEA AND VOMITING THAT IS NOT CONTROLLED WITH YOUR NAUSEA MEDICATION *UNUSUAL SHORTNESS OF BREATH *UNUSUAL BRUISING OR BLEEDING *URINARY PROBLEMS (pain or burning when urinating, or frequent urination) *BOWEL PROBLEMS (unusual diarrhea, constipation, pain near the anus) TENDERNESS IN MOUTH AND THROAT WITH OR WITHOUT PRESENCE OF ULCERS (sore throat, sores in mouth, or a toothache) UNUSUAL RASH, SWELLING OR PAIN  UNUSUAL VAGINAL DISCHARGE OR ITCHING   Items with * indicate a potential emergency and should be followed up as soon as possible or go to the Emergency Department if any problems should occur.  Please show the CHEMOTHERAPY ALERT CARD or IMMUNOTHERAPY ALERT CARD at  check-in to the Emergency Department and triage nurse.  Should you have questions after your visit or need to cancel or reschedule your appointment, please contact [facility]  Dept: [phone]  and follow the prompts.  Office hours are 8:00 a.m. to 4:30 p.m. Monday - Friday. Please note that voicemails left after 4:00 p.m. may not be returned until the following business day.  We are closed weekends and major holidays. You have access to a nurse at all times for urgent questions. Please call the main number to the clinic Dept: [phone] and follow the prompts.   For any non-urgent questions, you may also contact your provider using MyChart. We now offer e-Visits for anyone 18 and older to request care online for non-urgent symptoms. For details visit [email or web address].   Also download the MyChart app! Go to the app store, search "MyChart", open the app, select [facility], and log in with your MyChart username and password.   

## 2023-05-24 ENCOUNTER — Telehealth: Payer: Self-pay | Admitting: Internal Medicine

## 2023-05-24 NOTE — Telephone Encounter (Signed)
Called patient regarding June appointments, patient Is notified.  

## 2023-05-30 ENCOUNTER — Other Ambulatory Visit: Payer: Self-pay

## 2023-05-30 ENCOUNTER — Other Ambulatory Visit: Payer: 59

## 2023-05-30 ENCOUNTER — Inpatient Hospital Stay: Payer: 59

## 2023-05-30 ENCOUNTER — Ambulatory Visit: Payer: 59 | Admitting: Physician Assistant

## 2023-05-30 ENCOUNTER — Inpatient Hospital Stay (HOSPITAL_BASED_OUTPATIENT_CLINIC_OR_DEPARTMENT_OTHER): Payer: 59 | Admitting: Internal Medicine

## 2023-05-30 ENCOUNTER — Encounter: Payer: Self-pay | Admitting: Internal Medicine

## 2023-05-30 ENCOUNTER — Ambulatory Visit: Payer: 59

## 2023-05-30 VITALS — BP 110/64 | HR 66 | Resp 17

## 2023-05-30 VITALS — BP 104/64 | HR 67 | Temp 97.5°F | Resp 17 | Ht 69.0 in | Wt 186.0 lb

## 2023-05-30 DIAGNOSIS — C3432 Malignant neoplasm of lower lobe, left bronchus or lung: Secondary | ICD-10-CM

## 2023-05-30 DIAGNOSIS — C3490 Malignant neoplasm of unspecified part of unspecified bronchus or lung: Secondary | ICD-10-CM

## 2023-05-30 DIAGNOSIS — Z5112 Encounter for antineoplastic immunotherapy: Secondary | ICD-10-CM | POA: Diagnosis not present

## 2023-05-30 DIAGNOSIS — Z95828 Presence of other vascular implants and grafts: Secondary | ICD-10-CM

## 2023-05-30 LAB — CMP (CANCER CENTER ONLY)
ALT: 13 U/L (ref 0–44)
AST: 12 U/L — ABNORMAL LOW (ref 15–41)
Albumin: 3.6 g/dL (ref 3.5–5.0)
Alkaline Phosphatase: 110 U/L (ref 38–126)
Anion gap: 6 (ref 5–15)
BUN: 19 mg/dL (ref 6–20)
CO2: 27 mmol/L (ref 22–32)
Calcium: 9.2 mg/dL (ref 8.9–10.3)
Chloride: 107 mmol/L (ref 98–111)
Creatinine: 1.32 mg/dL — ABNORMAL HIGH (ref 0.61–1.24)
GFR, Estimated: >60 mL/min (ref >60)
Glucose, Bld: 91 mg/dL (ref 70–99)
Potassium: 4.1 mmol/L (ref 3.5–5.1)
Sodium: 140 mmol/L (ref 135–145)
Total Bilirubin: 0.3 mg/dL (ref 0.3–1.2)
Total Protein: 7.3 g/dL (ref 6.5–8.1)

## 2023-05-30 LAB — CBC WITH DIFFERENTIAL (CANCER CENTER ONLY)
Abs Immature Granulocytes: 0.03 10*3/uL (ref 0.00–0.07)
Basophils Absolute: 0.1 10*3/uL (ref 0.0–0.1)
Basophils Relative: 1 %
Eosinophils Absolute: 0.4 10*3/uL (ref 0.0–0.5)
Eosinophils Relative: 6 %
HCT: 40.1 % (ref 39.0–52.0)
Hemoglobin: 13.1 g/dL (ref 13.0–17.0)
Immature Granulocytes: 0 %
Lymphocytes Relative: 14 %
Lymphs Abs: 1 10*3/uL (ref 0.7–4.0)
MCH: 28.9 pg (ref 26.0–34.0)
MCHC: 32.7 g/dL (ref 30.0–36.0)
MCV: 88.3 fL (ref 80.0–100.0)
Monocytes Absolute: 0.7 10*3/uL (ref 0.1–1.0)
Monocytes Relative: 10 %
Neutro Abs: 4.6 10*3/uL (ref 1.7–7.7)
Neutrophils Relative %: 69 %
Platelet Count: 259 10*3/uL (ref 150–400)
RBC: 4.54 MIL/uL (ref 4.22–5.81)
RDW: 13.7 % (ref 11.5–15.5)
WBC Count: 6.7 10*3/uL (ref 4.0–10.5)
nRBC: 0 % (ref 0.0–0.2)

## 2023-05-30 LAB — TSH: TSH: 2.398 u[IU]/mL (ref 0.350–4.500)

## 2023-05-30 MED ORDER — SODIUM CHLORIDE 0.9 % IV SOLN
200.0000 mg | Freq: Once | INTRAVENOUS | Status: AC
  Administered 2023-05-30: 200 mg via INTRAVENOUS
  Filled 2023-05-30: qty 200

## 2023-05-30 MED ORDER — HEPARIN SOD (PORK) LOCK FLUSH 100 UNIT/ML IV SOLN
500.0000 [IU] | Freq: Once | INTRAVENOUS | Status: AC | PRN
  Administered 2023-05-30: 500 [IU]

## 2023-05-30 MED ORDER — SODIUM CHLORIDE 0.9 % IV SOLN: Freq: Once | INTRAVENOUS | Status: AC

## 2023-05-30 MED ORDER — SODIUM CHLORIDE 0.9% FLUSH
10.0000 mL | INTRAVENOUS | Status: DC | PRN
  Administered 2023-05-30: 10 mL

## 2023-05-30 MED ORDER — SODIUM CHLORIDE 0.9% FLUSH
10.0000 mL | Freq: Once | INTRAVENOUS | Status: AC
  Administered 2023-05-30: 10 mL

## 2023-05-30 NOTE — Progress Notes (Signed)
[facility] [phone]   [phone]  OFFICE PROGRESS NOTE  Cameron Found, MD [address] [place] [address]  DIAGNOSIS:  Stage IV (T2b, N2, M1 B) non-small cell lung cancer, adenocarcinoma presented with large central left lower lobe lung mass with left hilar and mediastinal lymphadenopathy as well as abdominal retroperitoneal lymphadenopathy diagnosed in May 2022.  The patient also has bilateral parotid gland nodules that need close monitoring.  CARIS MOLECULAR STUDY: Results with Therapy Associations BIOMARKER METHOD ANALYTE RESULT THERAPY ASSOCIATION BIOMARKER LEVEL* .PD-L1 (22c3) IHC Protein Positive, TPS: 50% BENEFIT cemiplimab, pembrolizumab Level 1 .PD-L1 (28-8) IHC Protein Positive  1+, 50% BENEFIT nivolumab/ipilimumab combination Level 1 .TMB Seq DNA-Tumor High, 18 mut/Mb BENEFIT pembrolizumab Level 2 . alectinib, ceritinib, crizotinib, lorlatinib Level 1 . IHC Protein Negative  0 brigatinib Level 2 . ALK Seq RNA-Tumor Fusion Not Detected LACK OF BENEFIT alectinib, brigatinib, ceritinib, crizotinib, lorlatinib Level 2 .BRAF Seq DNA-Tumor Mutation Not Detected LACK OF BENEFIT dabrafenib and trametinib combination therapy, vemurafenib Level 2 .EGFR Seq DNA-Tumor Mutation Not Detected LACK OF BENEFIT erlotinib, gefitinib Level 2 .KRAS Seq DNA-Tumor Mutation Not Detected LACK OF BENEFIT sotorasib Level 2 .RET Seq RNA-Tumor Fusion Not Detected LACK OF BENEFIT pralsetinib, selpercatinib Level 2 .ROS1 Seq RNA-Tumor Fusion Not Detected LACK OF BENEFIT ceritinib, crizotinib, entrectinib, lorlatinib Level 2 . CNA-Seq DNA-Tumor Amplification Not Detected .MET Seq RNA-Tumor Variant Transcript Not Detected LACK OF BENEFIT crizotinib Level 3  PRIOR THERAPY: Palliative radiotherapy to the large central left lower lobe lung mass under the care of Dr. Mitzi Hansen.  CURRENT THERAPY: Systemic chemotherapy with carboplatin for AUC  of 5, Alimta 500 Mg/M2 and possibly Keytruda 200 Mg IV every 3 weeks.  First dose expected June 14, 2021.  Starting from cycle #5 the patient is on maintenance treatment with Alimta and Keytruda every 3 weeks.  Status post 34 cycles of treatment.  Starting from cycle #12 the dose of Alimta to 400 Mg/M2 because of his increasing fatigue and side effects.  Alimta was discontinued secondary to renal insufficiency.  INTERVAL HISTORY: Cameron Harper 47 y.o. male returns to the clinic today for follow-up visit.  The patient is feeling fine today with no concerning complaints except for questionable insect bite close to the Port-A-Cath area.  He denied having any current chest pain, shortness of breath, cough or hemoptysis.  He has no nausea, vomiting, diarrhea or constipation.  He has no headache or visual changes.  He has no recent weight loss or night sweats.  He has been tolerating his treatment with maintenance Keytruda fairly well.  The patient is here today for evaluation before the last cycle of his treatment.  MEDICAL HISTORY: Past Medical History:  Diagnosis Date   Chronic back pain    Eczema    GERD (gastroesophageal reflux disease)    Lung cancer (HCC)     ALLERGIES:  is allergic to penicillins and zofran [ondansetron].  MEDICATIONS:  Current Outpatient Medications  Medication Sig Dispense Refill   acetaminophen (TYLENOL) 500 MG tablet Take 500-1,000 mg by mouth every 6 (six) hours as needed (for back pain.).     triamcinolone cream (KENALOG) 0.1 % Apply 1 Application topically 2 (two) times daily as needed. 30 g 0   lidocaine-prilocaine (EMLA) cream Apply 1 application topically as needed. (Patient not taking: Reported on 05/30/2023) 30 g 2   prochlorperazine (COMPAZINE) 10 MG tablet Take 1 tablet (10 mg total) by mouth every 6 (six) hours  as needed for nausea or vomiting. (Patient not taking: Reported on 05/30/2023) 30 tablet 0   No current facility-administered medications for this  visit.    SURGICAL HISTORY:  Past Surgical History:  Procedure Laterality Date   BRONCHIAL BRUSHINGS  05/12/2021   Procedure: BRONCHIAL BRUSHINGS;  Surgeon: Josephine Igo, DO;  [facility];  Service: Pulmonary;;   BRONCHIAL DILITATION  05/12/2021   Procedure: BRONCHIAL DILITATION;  Surgeon: Josephine Igo, DO;  [facility];  Service: Pulmonary;;   BRONCHIAL NEEDLE ASPIRATION BIOPSY  05/12/2021   Procedure: BRONCHIAL NEEDLE ASPIRATION BIOPSIES;  Surgeon: Josephine Igo, DO;  [facility];  Service: Pulmonary;;   BRONCHIAL WASHINGS  05/12/2021   Procedure: BRONCHIAL WASHINGS;  Surgeon: Josephine Igo, DO;  [facility];  Service: Pulmonary;;   CRYOTHERAPY  05/12/2021   Procedure: CRYOTHERAPY;  Surgeon: Josephine Igo, DO;  [facility];  Service: Pulmonary;;   HEMOSTASIS CONTROL  05/12/2021   Procedure: HEMOSTASIS CONTROL;  Surgeon: Josephine Igo, DO;  [facility];  Service: Pulmonary;;  epi injection   IR IMAGING GUIDED PORT INSERTION  06/29/2021   Spine injection     Pain control   VIDEO BRONCHOSCOPY WITH ENDOBRONCHIAL ULTRASOUND N/A 05/12/2021   Procedure: VIDEO BRONCHOSCOPY WITH ENDOBRONCHIAL ULTRASOUND;  Surgeon: Josephine Igo, DO;  [facility]: [facility];  Service: Pulmonary;  Laterality: N/A;   WISDOM TOOTH EXTRACTION     no anesthesia involed    REVIEW OF SYSTEMS:  A comprehensive review of systems was negative.   PHYSICAL EXAMINATION: General appearance: alert, cooperative, and no distress Head: Normocephalic, without obvious abnormality, atraumatic Neck: no adenopathy, no JVD, supple, symmetrical, trachea midline, and thyroid not enlarged, symmetric, no tenderness/mass/nodules Lymph nodes: Cervical, supraclavicular, and axillary nodes normal. Resp: clear to auscultation bilaterally Back: symmetric, no curvature. ROM normal. No CVA tenderness. Cardio: regular rate and rhythm, S1, S2 normal, no murmur,  click, rub or gallop GI: soft, non-tender; bowel sounds normal; no masses,  no organomegaly Extremities: extremities normal, atraumatic, no cyanosis or edema  ECOG PERFORMANCE STATUS: 0 - Asymptomatic  Blood pressure 104/64, pulse 67, temperature (!) 97.5 F (36.4 C), temperature source Temporal, resp. rate 17, height 5\' 9"  (1.753 m), weight 186 lb (84.4 kg), SpO2 98 %.  LABORATORY DATA: Lab Results  Component Value Date   WBC 6.7 05/30/2023   HGB 13.1 05/30/2023   HCT 40.1 05/30/2023   MCV 88.3 05/30/2023   PLT 259 05/30/2023      Chemistry      Component Value Date/Time   NA 140 05/10/2023 0926   K 3.9 05/10/2023 0926   CL 107 05/10/2023 0926   CO2 28 05/10/2023 0926   BUN 18 05/10/2023 0926   CREATININE 1.42 (H) 05/10/2023 0926      Component Value Date/Time   CALCIUM 9.1 05/10/2023 0926   ALKPHOS 107 05/10/2023 0926   AST 14 (L) 05/10/2023 0926   ALT 15 05/10/2023 0926   BILITOT 0.3 05/10/2023 0926       RADIOGRAPHIC STUDIES: No results Harper.  ASSESSMENT AND PLAN: This is a very pleasant 47 years old white male recently diagnosed with a stage IV (T2b, N2, M1 B) non-small cell lung cancer, adenocarcinoma presented with large central left lower lobe lung mass in addition to left hilar and mediastinal lymphadenopathy as well as retroperitoneal abdominal lymph node diagnosed in May 2022.  The patient is currently undergoing palliative radiotherapy to the large left lower lobe lung mass  under the care of of Dr. Mitzi Hansen.  He is expected to complete this course of treatment on June 12, 2021. MRI of the brain showed no evidence of metastatic disease to the brain. His molecular studies by [facility] showed no actionable mutations and PD-L1 expression of 50%. He is currently undergoing systemic chemotherapy with carboplatin for AUC of 5, Alimta 500 Mg/M2 and Keytruda 200 Mg IV every 3 weeks.  Starting from cycle #5 he is on maintenance treatment with Alimta at 400 Mg/M2 and Keytruda  200 Mg IV every 3 weeks status post 34 cycles.  He is currently on single agent Keytruda secondary to renal insufficiency. He has been tolerating this treatment well with no concerning adverse effects. I recommended for him to proceed with cycle #35 today as planned. I will see him back for follow-up visit in 1 months for evaluation with repeat CT scan of the chest, abdomen and pelvis for restaging of his disease. For the anemia he will continue with the oral iron tablets for now. The patient was advised to call immediately if he has any other concerning symptoms in the interval. The patient voices understanding of current disease status and treatment options and is in agreement with the current care plan. All questions were answered. The patient knows to call the clinic with any problems, questions or concerns. We can certainly see the patient much sooner if necessary. The total time spent in the appointment was 20 minutes.  Disclaimer: This note was dictated with voice recognition software. Similar sounding words can inadvertently be transcribed and may not be corrected upon review.

## 2023-06-01 ENCOUNTER — Other Ambulatory Visit: Payer: Self-pay

## 2023-06-13 ENCOUNTER — Telehealth: Payer: Self-pay | Admitting: Medical Oncology

## 2023-06-13 NOTE — Telephone Encounter (Addendum)
Cameron Harper needs several teeth extracted. Marland Kitchen

## 2023-06-14 ENCOUNTER — Encounter: Payer: Self-pay | Admitting: Internal Medicine

## 2023-06-14 ENCOUNTER — Encounter: Payer: Self-pay | Admitting: Physician Assistant

## 2023-06-14 NOTE — Telephone Encounter (Signed)
Cameron Harper said his dentist told him he needed dental extractions. He is on Keytruda only. Is it ok to proceed with dental extractions? I told him it is ok for dental extractions since he is only on Keytruda.

## 2023-06-24 ENCOUNTER — Inpatient Hospital Stay: Payer: 59

## 2023-06-26 ENCOUNTER — Other Ambulatory Visit: Payer: Self-pay

## 2023-06-26 ENCOUNTER — Ambulatory Visit (HOSPITAL_COMMUNITY)
Admission: RE | Admit: 2023-06-26 | Discharge: 2023-06-26 | Disposition: A | Discharge status: Home / Self Care | Payer: 59 | Source: Ambulatory Visit | Attending: Internal Medicine | Admitting: Internal Medicine

## 2023-06-26 ENCOUNTER — Encounter (HOSPITAL_COMMUNITY): Payer: Self-pay

## 2023-06-26 ENCOUNTER — Inpatient Hospital Stay: Payer: 59 | Attending: Physician Assistant

## 2023-06-26 ENCOUNTER — Telehealth: Payer: Self-pay | Admitting: Internal Medicine

## 2023-06-26 DIAGNOSIS — J90 Pleural effusion, not elsewhere classified: Secondary | ICD-10-CM | POA: Diagnosis not present

## 2023-06-26 DIAGNOSIS — C3432 Malignant neoplasm of lower lobe, left bronchus or lung: Secondary | ICD-10-CM | POA: Insufficient documentation

## 2023-06-26 DIAGNOSIS — C3490 Malignant neoplasm of unspecified part of unspecified bronchus or lung: Secondary | ICD-10-CM | POA: Insufficient documentation

## 2023-06-26 DIAGNOSIS — Z95828 Presence of other vascular implants and grafts: Secondary | ICD-10-CM

## 2023-06-26 DIAGNOSIS — N281 Cyst of kidney, acquired: Secondary | ICD-10-CM | POA: Diagnosis not present

## 2023-06-26 DIAGNOSIS — K5790 Diverticulosis of intestine, part unspecified, without perforation or abscess without bleeding: Secondary | ICD-10-CM | POA: Diagnosis not present

## 2023-06-26 DIAGNOSIS — D649 Anemia, unspecified: Secondary | ICD-10-CM | POA: Insufficient documentation

## 2023-06-26 DIAGNOSIS — R591 Generalized enlarged lymph nodes: Secondary | ICD-10-CM | POA: Diagnosis not present

## 2023-06-26 LAB — CBC WITH DIFFERENTIAL (CANCER CENTER ONLY)
Abs Immature Granulocytes: 0.03 10*3/uL (ref 0.00–0.07)
Basophils Absolute: 0.1 10*3/uL (ref 0.0–0.1)
Basophils Relative: 1 %
Eosinophils Absolute: 0.3 10*3/uL (ref 0.0–0.5)
Eosinophils Relative: 4 %
HCT: 40.6 % (ref 39.0–52.0)
Hemoglobin: 13.3 g/dL (ref 13.0–17.0)
Immature Granulocytes: 1 %
Lymphocytes Relative: 14 %
Lymphs Abs: 0.8 10*3/uL (ref 0.7–4.0)
MCH: 28.5 pg (ref 26.0–34.0)
MCHC: 32.8 g/dL (ref 30.0–36.0)
MCV: 87.1 fL (ref 80.0–100.0)
Monocytes Absolute: 0.7 10*3/uL (ref 0.1–1.0)
Monocytes Relative: 12 %
Neutro Abs: 4.1 10*3/uL (ref 1.7–7.7)
Neutrophils Relative %: 68 %
Platelet Count: 243 10*3/uL (ref 150–400)
RBC: 4.66 MIL/uL (ref 4.22–5.81)
RDW: 14 % (ref 11.5–15.5)
WBC Count: 6 10*3/uL (ref 4.0–10.5)
nRBC: 0 % (ref 0.0–0.2)

## 2023-06-26 LAB — CMP (CANCER CENTER ONLY)
ALT: 15 U/L (ref 0–44)
AST: 14 U/L — ABNORMAL LOW (ref 15–41)
Albumin: 3.9 g/dL (ref 3.5–5.0)
Alkaline Phosphatase: 116 U/L (ref 38–126)
Anion gap: 3 — ABNORMAL LOW (ref 5–15)
BUN: 16 mg/dL (ref 6–20)
CO2: 29 mmol/L (ref 22–32)
Calcium: 9.4 mg/dL (ref 8.9–10.3)
Chloride: 108 mmol/L (ref 98–111)
Creatinine: 1.22 mg/dL (ref 0.61–1.24)
GFR, Estimated: >60 mL/min (ref >60)
Glucose, Bld: 68 mg/dL — ABNORMAL LOW (ref 70–99)
Potassium: 4.2 mmol/L (ref 3.5–5.1)
Sodium: 140 mmol/L (ref 135–145)
Total Bilirubin: 0.3 mg/dL (ref 0.3–1.2)
Total Protein: 7 g/dL (ref 6.5–8.1)

## 2023-06-26 LAB — TSH: TSH: 1.709 u[IU]/mL (ref 0.350–4.500)

## 2023-06-26 MED ORDER — IOHEXOL 300 MG/ML  SOLN
80.0000 mL | Freq: Once | INTRAMUSCULAR | Status: AC | PRN
  Administered 2023-06-26: 80 mL via INTRAVENOUS

## 2023-06-26 MED ORDER — SODIUM CHLORIDE 0.9% FLUSH
10.0000 mL | Freq: Once | INTRAVENOUS | Status: AC
  Administered 2023-06-26: 10 mL

## 2023-06-26 MED ORDER — SODIUM CHLORIDE (PF) 0.9 % IJ SOLN
INTRAMUSCULAR | Status: AC
  Filled 2023-06-26: qty 50

## 2023-06-26 MED ORDER — HEPARIN SOD (PORK) LOCK FLUSH 100 UNIT/ML IV SOLN
INTRAVENOUS | Status: AC
  Filled 2023-06-26: qty 5

## 2023-06-26 MED ORDER — HEPARIN SOD (PORK) LOCK FLUSH 100 UNIT/ML IV SOLN
500.0000 [IU] | Freq: Once | INTRAVENOUS | Status: AC
  Administered 2023-06-26: 500 [IU] via INTRAVENOUS

## 2023-06-26 NOTE — Telephone Encounter (Signed)
Rescheduled 07/25 appointment to August due to scheduling conflict. Patient is notified.

## 2023-06-27 ENCOUNTER — Other Ambulatory Visit: Payer: Self-pay

## 2023-06-27 ENCOUNTER — Inpatient Hospital Stay: Payer: 59 | Admitting: Internal Medicine

## 2023-07-04 ENCOUNTER — Other Ambulatory Visit: Payer: Self-pay

## 2023-07-04 ENCOUNTER — Inpatient Hospital Stay: Payer: 59 | Attending: Physician Assistant | Admitting: Internal Medicine

## 2023-07-04 VITALS — BP 106/58 | HR 61 | Temp 98.2°F | Resp 18 | Ht 69.0 in | Wt 184.6 lb

## 2023-07-04 DIAGNOSIS — C3490 Malignant neoplasm of unspecified part of unspecified bronchus or lung: Secondary | ICD-10-CM

## 2023-07-04 DIAGNOSIS — D649 Anemia, unspecified: Secondary | ICD-10-CM | POA: Diagnosis not present

## 2023-07-04 DIAGNOSIS — C3432 Malignant neoplasm of lower lobe, left bronchus or lung: Secondary | ICD-10-CM | POA: Insufficient documentation

## 2023-07-04 DIAGNOSIS — Z9221 Personal history of antineoplastic chemotherapy: Secondary | ICD-10-CM | POA: Diagnosis not present

## 2023-07-04 NOTE — Progress Notes (Signed)
[facility] [phone]   [phone]  OFFICE PROGRESS NOTE  [name], MD [address] [place] [address]  DIAGNOSIS:  Stage IV (T2b, N2, M1 B) non-small cell lung cancer, adenocarcinoma presented with large central left lower lobe lung mass with left hilar and mediastinal lymphadenopathy as well as abdominal retroperitoneal lymphadenopathy diagnosed in May 2022.  The patient also has bilateral parotid gland nodules that need close monitoring.  [facility] MOLECULAR STUDY: Results with Therapy Associations BIOMARKER METHOD ANALYTE RESULT THERAPY ASSOCIATION BIOMARKER LEVEL* .PD-L1 (22c3) IHC Protein Positive, TPS: 50% BENEFIT cemiplimab, pembrolizumab Level 1 .PD-L1 (28-8) IHC Protein Positive  1+, 50% BENEFIT nivolumab/ipilimumab combination Level 1 .TMB Seq DNA-Tumor High, 18 mut/Mb BENEFIT pembrolizumab Level 2 . alectinib, ceritinib, crizotinib, lorlatinib Level 1 . IHC Protein Negative  0 brigatinib Level 2 . ALK Seq RNA-Tumor Fusion Not Detected LACK OF BENEFIT alectinib, brigatinib, ceritinib, crizotinib, lorlatinib Level 2 .BRAF Seq DNA-Tumor Mutation Not Detected LACK OF BENEFIT dabrafenib and trametinib combination therapy, vemurafenib Level 2 .EGFR Seq DNA-Tumor Mutation Not Detected LACK OF BENEFIT erlotinib, gefitinib Level 2 .KRAS Seq DNA-Tumor Mutation Not Detected LACK OF BENEFIT sotorasib Level 2 .RET Seq RNA-Tumor Fusion Not Detected LACK OF BENEFIT pralsetinib, selpercatinib Level 2 .ROS1 Seq RNA-Tumor Fusion Not Detected LACK OF BENEFIT ceritinib, crizotinib, entrectinib, lorlatinib Level 2 . CNA-Seq DNA-Tumor Amplification Not Detected .MET Seq RNA-Tumor Variant Transcript Not Detected LACK OF BENEFIT crizotinib Level 3  PRIOR THERAPY:  1) Palliative radiotherapy to the large central left lower lobe lung mass under the care of Dr. Mitzi Hansen. 2) Systemic chemotherapy with carboplatin for AUC of 5,  Alimta 500 Mg/M2 and possibly Keytruda 200 Mg IV every 3 weeks.  First dose expected June 14, 2021.  Starting from cycle #5 the patient is on maintenance treatment with Alimta and Keytruda every 3 weeks.  Status post 35 cycles of treatment.  Starting from cycle #12 the dose of Alimta to 400 Mg/M2 because of his increasing fatigue and side effects.  Alimta was discontinued secondary to renal insufficiency.  CURRENT THERAPY: Observation.  INTERVAL HISTORY: Cameron Harper 47 y.o. male returns to the clinic today for follow-up visit.  The patient is feeling fine today with no concerning complaints.  He denied having any current chest pain, shortness of breath, cough or hemoptysis.  He has no nausea, vomiting, diarrhea or constipation.  He has no headache or visual changes.  He denied having any fever or chills.  He is currently on observation and feeling fine.  He is here today for evaluation with repeat CT scan of the chest, abdomen and pelvis for restaging of his disease.  MEDICAL HISTORY: Past Medical History:  Diagnosis Date   Chronic back pain    Eczema    GERD (gastroesophageal reflux disease)    Lung cancer (HCC)     ALLERGIES:  is allergic to penicillins and zofran [ondansetron].  MEDICATIONS:  Current Outpatient Medications  Medication Sig Dispense Refill   acetaminophen (TYLENOL) 500 MG tablet Take 500-1,000 mg by mouth every 6 (six) hours as needed (for back pain.).     lidocaine-prilocaine (EMLA) cream Apply 1 application topically as needed. (Patient not taking: Reported on 05/30/2023) 30 g 2   prochlorperazine (COMPAZINE) 10 MG tablet Take 1 tablet (10 mg total) by mouth every 6 (six) hours as needed for nausea or vomiting. (Patient not taking: Reported on 05/30/2023) 30 tablet 0   triamcinolone cream (KENALOG) 0.1 % Apply 1  Application topically 2 (two) times daily as needed. 30 g 0   No current facility-administered medications for this visit.    SURGICAL HISTORY:  Past  Surgical History:  Procedure Laterality Date   BRONCHIAL BRUSHINGS  05/12/2021   Procedure: BRONCHIAL BRUSHINGS;  Surgeon: Josephine Igo, DO;  Location: [facility];  Service: Pulmonary;;   BRONCHIAL DILITATION  05/12/2021   Procedure: BRONCHIAL DILITATION;  Surgeon: Josephine Igo, DO;  Location: [facility];  Service: Pulmonary;;   BRONCHIAL NEEDLE ASPIRATION BIOPSY  05/12/2021   Procedure: BRONCHIAL NEEDLE ASPIRATION BIOPSIES;  Surgeon: Josephine Igo, DO;  Location: [facility];  Service: Pulmonary;;   BRONCHIAL WASHINGS  05/12/2021   Procedure: BRONCHIAL WASHINGS;  Surgeon: Josephine Igo, DO;  Location: [facility];  Service: Pulmonary;;   CRYOTHERAPY  05/12/2021   Procedure: CRYOTHERAPY;  Surgeon: Josephine Igo, DO;  Location: [facility];  Service: Pulmonary;;   HEMOSTASIS CONTROL  05/12/2021   Procedure: HEMOSTASIS CONTROL;  Surgeon: Josephine Igo, DO;  Location: [facility];  Service: Pulmonary;;  epi injection   IR IMAGING GUIDED PORT INSERTION  06/29/2021   Spine injection     Pain control   VIDEO BRONCHOSCOPY WITH ENDOBRONCHIAL ULTRASOUND N/A 05/12/2021   Procedure: VIDEO BRONCHOSCOPY WITH ENDOBRONCHIAL ULTRASOUND;  Surgeon: Josephine Igo, DO;  Location: [facility];  Service: Pulmonary;  Laterality: N/A;   WISDOM TOOTH EXTRACTION     no anesthesia involed    REVIEW OF SYSTEMS:  Constitutional: negative Eyes: negative Ears, nose, mouth, throat, and face: negative Respiratory: negative Cardiovascular: negative Gastrointestinal: negative Genitourinary:negative Integument/breast: negative Hematologic/lymphatic: negative Musculoskeletal:negative Neurological: negative Behavioral/Psych: negative Endocrine: negative Allergic/Immunologic: negative   PHYSICAL EXAMINATION: General appearance: alert, cooperative, and no distress Head: Normocephalic, without obvious abnormality, atraumatic Neck: no adenopathy, no JVD, supple, symmetrical, trachea midline,  and thyroid not enlarged, symmetric, no tenderness/mass/nodules Lymph nodes: Cervical, supraclavicular, and axillary nodes normal. Resp: clear to auscultation bilaterally Back: symmetric, no curvature. ROM normal. No CVA tenderness. Cardio: regular rate and rhythm, S1, S2 normal, no murmur, click, rub or gallop GI: soft, non-tender; bowel sounds normal; no masses,  no organomegaly Extremities: extremities normal, atraumatic, no cyanosis or edema Neurologic: Alert and oriented X 3, normal strength and tone. Normal symmetric reflexes. Normal coordination and gait  ECOG PERFORMANCE STATUS: 0 - Asymptomatic  Blood pressure (!) 106/58, pulse 61, temperature 98.2 F (36.8 C), temperature source Oral, resp. rate 18, height 5\' 9"  (1.753 m), weight 184 lb 9.6 oz (83.7 kg), SpO2 99%.  LABORATORY DATA: Lab Results  Component Value Date   WBC 6.0 06/26/2023   HGB 13.3 06/26/2023   HCT 40.6 06/26/2023   MCV 87.1 06/26/2023   PLT 243 06/26/2023      Chemistry      Component Value Date/Time   NA 140 06/26/2023 1026   K 4.2 06/26/2023 1026   CL 108 06/26/2023 1026   CO2 29 06/26/2023 1026   BUN 16 06/26/2023 1026   CREATININE 1.22 06/26/2023 1026      Component Value Date/Time   CALCIUM 9.4 06/26/2023 1026   ALKPHOS 116 06/26/2023 1026   AST 14 (L) 06/26/2023 1026   ALT 15 06/26/2023 1026   BILITOT 0.3 06/26/2023 1026       RADIOGRAPHIC STUDIES: CT Chest W Contrast  Result Date: 07/03/2023 CLINICAL DATA:  Non-small cell lung cancer staging; * Tracking Code: BO * EXAM: CT CHEST, ABDOMEN, AND PELVIS WITH CONTRAST TECHNIQUE: Multidetector CT imaging of the chest, abdomen and pelvis  was performed following the standard protocol during bolus administration of intravenous contrast. RADIATION DOSE REDUCTION: This exam was performed according to the departmental dose-optimization program which includes automated exposure control, adjustment of the mA and/or kV according to patient size and/or  use of iterative reconstruction technique. CONTRAST:  80mL OMNIPAQUE IOHEXOL 300 MG/ML  SOLN COMPARISON:  Multiple priors, most recent CT chest, and pelvis dated March 26, 2023 FINDINGS: CT CHEST FINDINGS Cardiovascular: Normal heart size. No pericardial effusion. Normal caliber thoracic aorta with mild atherosclerotic disease. Right chest wall port with tip the superior cavoatrial junction. Mediastinum/Nodes: Esophagus and thyroid are unremarkable. No enlarged lymph nodes seen in the chest. Lungs/Pleura: Central airways are patent. Mild centrilobular emphysema. Stable left perihilar postradiation change. No new or enlarging pulmonary nodules. Stable small left pleural effusion. Musculoskeletal: No chest wall mass or suspicious bone lesions identified. CT ABDOMEN PELVIS FINDINGS Hepatobiliary: No focal liver abnormality is seen. No gallstones, gallbladder wall thickening, or biliary dilatation. Pancreas: Unremarkable. No pancreatic ductal dilatation or surrounding inflammatory changes. Spleen: Normal in size without focal abnormality. Adrenals/Urinary Tract: Bilateral adrenal glands are unremarkable. No hydronephrosis or nephrolithiasis. Simple appearing cyst of the upper pole of the left kidney. Bladder is unremarkable. Stomach/Bowel: Stomach is within normal limits. Appendix appears normal. Diverticulosis. No evidence of bowel wall thickening, distention, or inflammatory changes. Vascular/Lymphatic: Normal caliber thoracic aorta with moderate atherosclerotic disease. No enlarged lymph nodes seen in the abdomen or pelvis. Reproductive: Prostate is unremarkable. Other: No abdominal wall hernia or abnormality. No abdominopelvic ascites. Musculoskeletal: No aggressive appearing osseous lesions. IMPRESSION: 1. Stable left perihilar postradiation change. No evidence of recurrent disease in the chest. 2. Stable small left pleural effusion. 3. No evidence of metastatic disease in the abdomen or pelvis. Electronically  Signed   By: Allegra Lai M.D.   On: 07/03/2023 09:03   CT Abdomen Pelvis W Contrast  Result Date: 07/03/2023 CLINICAL DATA:  Non-small cell lung cancer staging; * Tracking Code: BO * EXAM: CT CHEST, ABDOMEN, AND PELVIS WITH CONTRAST TECHNIQUE: Multidetector CT imaging of the chest, abdomen and pelvis was performed following the standard protocol during bolus administration of intravenous contrast. RADIATION DOSE REDUCTION: This exam was performed according to the departmental dose-optimization program which includes automated exposure control, adjustment of the mA and/or kV according to patient size and/or use of iterative reconstruction technique. CONTRAST:  80mL OMNIPAQUE IOHEXOL 300 MG/ML  SOLN COMPARISON:  Multiple priors, most recent CT chest, and pelvis dated March 26, 2023 FINDINGS: CT CHEST FINDINGS Cardiovascular: Normal heart size. No pericardial effusion. Normal caliber thoracic aorta with mild atherosclerotic disease. Right chest wall port with tip the superior cavoatrial junction. Mediastinum/Nodes: Esophagus and thyroid are unremarkable. No enlarged lymph nodes seen in the chest. Lungs/Pleura: Central airways are patent. Mild centrilobular emphysema. Stable left perihilar postradiation change. No new or enlarging pulmonary nodules. Stable small left pleural effusion. Musculoskeletal: No chest wall mass or suspicious bone lesions identified. CT ABDOMEN PELVIS FINDINGS Hepatobiliary: No focal liver abnormality is seen. No gallstones, gallbladder wall thickening, or biliary dilatation. Pancreas: Unremarkable. No pancreatic ductal dilatation or surrounding inflammatory changes. Spleen: Normal in size without focal abnormality. Adrenals/Urinary Tract: Bilateral adrenal glands are unremarkable. No hydronephrosis or nephrolithiasis. Simple appearing cyst of the upper pole of the left kidney. Bladder is unremarkable. Stomach/Bowel: Stomach is within normal limits. Appendix appears normal.  Diverticulosis. No evidence of bowel wall thickening, distention, or inflammatory changes. Vascular/Lymphatic: Normal caliber thoracic aorta with moderate atherosclerotic disease. No enlarged lymph nodes seen in the  abdomen or pelvis. Reproductive: Prostate is unremarkable. Other: No abdominal wall hernia or abnormality. No abdominopelvic ascites. Musculoskeletal: No aggressive appearing osseous lesions. IMPRESSION: 1. Stable left perihilar postradiation change. No evidence of recurrent disease in the chest. 2. Stable small left pleural effusion. 3. No evidence of metastatic disease in the abdomen or pelvis. Electronically Signed   By: Allegra Lai M.D.   On: 07/03/2023 09:03    ASSESSMENT AND PLAN: This is a very pleasant 47 years old white male recently diagnosed with a stage IV (T2b, N2, M1 B) non-small cell lung cancer, adenocarcinoma presented with large central left lower lobe lung mass in addition to left hilar and mediastinal lymphadenopathy as well as retroperitoneal abdominal lymph node diagnosed in May 2022.  The patient is currently undergoing palliative radiotherapy to the large left lower lobe lung mass under the care of of Dr. Mitzi Hansen.  He is expected to complete this course of treatment on June 12, 2021. MRI of the brain showed no evidence of metastatic disease to the brain. His molecular studies by [facility] showed no actionable mutations and PD-L1 expression of 50%. He underwent systemic chemotherapy with carboplatin for AUC of 5, Alimta 500 Mg/M2 and Keytruda 200 Mg IV every 3 weeks.  Starting from cycle #5 he is on maintenance treatment with Alimta at 400 Mg/M2 and Keytruda 200 Mg IV every 3 weeks status post 35 cycles.  The last few cycles were given with only single agent Keytruda. The patient is currently on observation and he is feeling fine with no concerning complaints. He had repeat CT scan of the chest, abdomen and pelvis performed recently.  I personally and independently reviewed  the scan and discussed results with the patient today. His scan showed no concerning findings for disease recurrence or metastasis. I recommended for him to continue on observation with repeat CT scan of the chest, abdomen and pelvis in 3 months. For the anemia he will continue with the oral iron tablets for now. He was advised to call immediately if he has any other concerning symptoms in the interval. The patient voices understanding of current disease status and treatment options and is in agreement with the current care plan. All questions were answered. The patient knows to call the clinic with any problems, questions or concerns. We can certainly see the patient much sooner if necessary. The total time spent in the appointment was 30 minutes.  Disclaimer: This note was dictated with voice recognition software. Similar sounding words can inadvertently be transcribed and may not be corrected upon review.

## 2023-07-06 ENCOUNTER — Other Ambulatory Visit: Payer: Self-pay

## 2023-07-24 ENCOUNTER — Encounter: Payer: Self-pay | Admitting: Internal Medicine

## 2023-09-26 ENCOUNTER — Inpatient Hospital Stay: Payer: 59 | Attending: Physician Assistant

## 2023-09-26 ENCOUNTER — Other Ambulatory Visit: Payer: Self-pay

## 2023-09-26 ENCOUNTER — Other Ambulatory Visit: Payer: Self-pay | Admitting: Internal Medicine

## 2023-09-26 ENCOUNTER — Ambulatory Visit (HOSPITAL_COMMUNITY)
Admission: RE | Admit: 2023-09-26 | Discharge: 2023-09-26 | Disposition: A | Discharge status: Home / Self Care | Payer: 59 | Source: Ambulatory Visit | Attending: Internal Medicine | Admitting: Internal Medicine

## 2023-09-26 ENCOUNTER — Other Ambulatory Visit: Payer: 59

## 2023-09-26 DIAGNOSIS — C3490 Malignant neoplasm of unspecified part of unspecified bronchus or lung: Secondary | ICD-10-CM

## 2023-09-26 DIAGNOSIS — J929 Pleural plaque without asbestos: Secondary | ICD-10-CM | POA: Diagnosis not present

## 2023-09-26 DIAGNOSIS — N281 Cyst of kidney, acquired: Secondary | ICD-10-CM | POA: Diagnosis not present

## 2023-09-26 DIAGNOSIS — Z95828 Presence of other vascular implants and grafts: Secondary | ICD-10-CM

## 2023-09-26 DIAGNOSIS — J90 Pleural effusion, not elsewhere classified: Secondary | ICD-10-CM | POA: Diagnosis not present

## 2023-09-26 DIAGNOSIS — C3432 Malignant neoplasm of lower lobe, left bronchus or lung: Secondary | ICD-10-CM

## 2023-09-26 DIAGNOSIS — N289 Disorder of kidney and ureter, unspecified: Secondary | ICD-10-CM | POA: Diagnosis not present

## 2023-09-26 LAB — CMP (CANCER CENTER ONLY)
ALT: 16 U/L (ref 0–44)
AST: 15 U/L (ref 15–41)
Albumin: 4 g/dL (ref 3.5–5.0)
Alkaline Phosphatase: 110 U/L (ref 38–126)
Anion gap: 5 (ref 5–15)
BUN: 14 mg/dL (ref 6–20)
CO2: 30 mmol/L (ref 22–32)
Calcium: 9.3 mg/dL (ref 8.9–10.3)
Chloride: 105 mmol/L (ref 98–111)
Creatinine: 1.36 mg/dL — ABNORMAL HIGH (ref 0.61–1.24)
GFR, Estimated: >60 mL/min (ref >60)
Glucose, Bld: 86 mg/dL (ref 70–99)
Potassium: 4.1 mmol/L (ref 3.5–5.1)
Sodium: 140 mmol/L (ref 135–145)
Total Bilirubin: 0.5 mg/dL (ref 0.3–1.2)
Total Protein: 7.3 g/dL (ref 6.5–8.1)

## 2023-09-26 LAB — CBC WITH DIFFERENTIAL (CANCER CENTER ONLY)
Abs Immature Granulocytes: 0.01 10*3/uL (ref 0.00–0.07)
Basophils Absolute: 0.1 10*3/uL (ref 0.0–0.1)
Basophils Relative: 1 %
Eosinophils Absolute: 0.3 10*3/uL (ref 0.0–0.5)
Eosinophils Relative: 5 %
HCT: 41.6 % (ref 39.0–52.0)
Hemoglobin: 13.5 g/dL (ref 13.0–17.0)
Immature Granulocytes: 0 %
Lymphocytes Relative: 14 %
Lymphs Abs: 0.9 10*3/uL (ref 0.7–4.0)
MCH: 28.5 pg (ref 26.0–34.0)
MCHC: 32.5 g/dL (ref 30.0–36.0)
MCV: 87.9 fL (ref 80.0–100.0)
Monocytes Absolute: 0.7 10*3/uL (ref 0.1–1.0)
Monocytes Relative: 12 %
Neutro Abs: 4.2 10*3/uL (ref 1.7–7.7)
Neutrophils Relative %: 68 %
Platelet Count: 233 10*3/uL (ref 150–400)
RBC: 4.73 MIL/uL (ref 4.22–5.81)
RDW: 14.3 % (ref 11.5–15.5)
WBC Count: 6.1 10*3/uL (ref 4.0–10.5)
nRBC: 0 % (ref 0.0–0.2)

## 2023-09-26 LAB — TSH: TSH: 2.665 u[IU]/mL (ref 0.350–4.500)

## 2023-09-26 MED ORDER — SODIUM CHLORIDE 0.9% FLUSH
10.0000 mL | Freq: Once | INTRAVENOUS | Status: AC
  Administered 2023-09-26: 10 mL

## 2023-09-26 MED ORDER — HEPARIN SOD (PORK) LOCK FLUSH 100 UNIT/ML IV SOLN
INTRAVENOUS | Status: AC
Start: 2023-09-26 — End: ?
  Filled 2023-09-26: qty 5

## 2023-09-26 MED ORDER — HEPARIN SOD (PORK) LOCK FLUSH 100 UNIT/ML IV SOLN
INTRAVENOUS | Status: AC
  Filled 2023-09-26: qty 5

## 2023-09-26 MED ORDER — IOHEXOL 300 MG/ML  SOLN
100.0000 mL | Freq: Once | INTRAMUSCULAR | Status: AC | PRN
Start: 2023-09-26 — End: 2023-09-26
  Administered 2023-09-26: 80 mL via INTRAVENOUS

## 2023-09-26 MED ORDER — HEPARIN SOD (PORK) LOCK FLUSH 100 UNIT/ML IV SOLN
500.0000 [IU] | Freq: Once | INTRAVENOUS | Status: AC
  Administered 2023-09-26: 500 [IU] via INTRAVENOUS

## 2023-10-02 ENCOUNTER — Inpatient Hospital Stay (HOSPITAL_BASED_OUTPATIENT_CLINIC_OR_DEPARTMENT_OTHER): Payer: 59 | Admitting: Internal Medicine

## 2023-10-02 VITALS — BP 128/66 | HR 74 | Temp 98.2°F | Resp 17 | Ht 69.0 in | Wt 184.4 lb

## 2023-10-02 DIAGNOSIS — N2889 Other specified disorders of kidney and ureter: Secondary | ICD-10-CM | POA: Diagnosis not present

## 2023-10-02 DIAGNOSIS — C3432 Malignant neoplasm of lower lobe, left bronchus or lung: Secondary | ICD-10-CM | POA: Insufficient documentation

## 2023-10-02 DIAGNOSIS — N289 Disorder of kidney and ureter, unspecified: Secondary | ICD-10-CM | POA: Diagnosis not present

## 2023-10-02 NOTE — Progress Notes (Signed)
[facility] [phone]   [phone]  OFFICE PROGRESS NOTE  Cameron Found, MD [address] [place] [address]  DIAGNOSIS:  Stage IV (T2b, N2, M1 B) non-small cell lung cancer, adenocarcinoma presented with large central left lower lobe lung mass with left hilar and mediastinal lymphadenopathy as well as abdominal retroperitoneal lymphadenopathy diagnosed in May 2022.  The patient also has bilateral parotid gland nodules that need close monitoring.  CARIS MOLECULAR STUDY: Results with Therapy Associations BIOMARKER METHOD ANALYTE RESULT THERAPY ASSOCIATION BIOMARKER LEVEL* .PD-L1 (22c3) IHC Protein Positive, TPS: 50% BENEFIT cemiplimab, pembrolizumab Level 1 .PD-L1 (28-8) IHC Protein Positive  1+, 50% BENEFIT nivolumab/ipilimumab combination Level 1 .TMB Seq DNA-Tumor High, 18 mut/Mb BENEFIT pembrolizumab Level 2 . alectinib, ceritinib, crizotinib, lorlatinib Level 1 . IHC Protein Negative  0 brigatinib Level 2 . ALK Seq RNA-Tumor Fusion Not Detected LACK OF BENEFIT alectinib, brigatinib, ceritinib, crizotinib, lorlatinib Level 2 .BRAF Seq DNA-Tumor Mutation Not Detected LACK OF BENEFIT dabrafenib and trametinib combination therapy, vemurafenib Level 2 .EGFR Seq DNA-Tumor Mutation Not Detected LACK OF BENEFIT erlotinib, gefitinib Level 2 .KRAS Seq DNA-Tumor Mutation Not Detected LACK OF BENEFIT sotorasib Level 2 .RET Seq RNA-Tumor Fusion Not Detected LACK OF BENEFIT pralsetinib, selpercatinib Level 2 .ROS1 Seq RNA-Tumor Fusion Not Detected LACK OF BENEFIT ceritinib, crizotinib, entrectinib, lorlatinib Level 2 . CNA-Seq DNA-Tumor Amplification Not Detected .MET Seq RNA-Tumor Variant Transcript Not Detected LACK OF BENEFIT crizotinib Level 3  PRIOR THERAPY:  1) Palliative radiotherapy to the large central left lower lobe lung mass under the care of Dr. Mitzi Hansen. 2) Systemic chemotherapy with carboplatin for AUC of 5,  Alimta 500 Mg/M2 and possibly Keytruda 200 Mg IV every 3 weeks.  First dose expected June 14, 2021.  Starting from cycle #5 the patient is on maintenance treatment with Alimta and Keytruda every 3 weeks.  Status post 35 cycles of treatment.  Starting from cycle #12 the dose of Alimta to 400 Mg/M2 because of his increasing fatigue and side effects.  Alimta was discontinued secondary to renal insufficiency.  CURRENT THERAPY: Observation.  INTERVAL HISTORY: Cameron Harper 47 y.o. male returns to the clinic today for follow-up visit.  Discussed the use of AI scribe software for clinical note transcription with the patient, who gave verbal consent to proceed.  History of Present Illness   The patient, a 47 year old with a history of stage 4 adenocarcinoma, completed two years of treatment with Keytruda, with the last dose administered in July 2024. The initial treatment regimen included Carboplatin, Alimta, and Keytruda for a few months, followed by a period of Alimta and Keytruda. Alimta was eventually discontinued due to worsening kidney function. Since the completion of Keytruda treatment, the patient has been under observation with no new complaints or symptoms such as chest pain, breathing issues, nausea, vomiting, diarrhea, or headaches. The patient reports a slight weight loss of approximately one pound since the last visit in August.  A recent scan revealed a 1.4 cm spot on the kidney, previously identified and monitored as a cyst. 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        MEDICAL HISTORY: Past Medical History:  Diagnosis Date   Chronic back pain    Eczema    GERD (gastroesophageal reflux disease)    Lung cancer (HCC)     ALLERGIES:  is allergic to penicillins and zofran [ondansetron].  MEDICATIONS:  Current Outpatient Medications  Medication Sig Dispense Refill   acetaminophen  (TYLENOL) 500 MG tablet Take 500-1,000 mg by mouth every 6 (six) hours as needed (for back pain.).     lidocaine-prilocaine (EMLA) cream Apply 1 application topically as needed. (Patient not taking: Reported on 05/30/2023) 30 g 2   prochlorperazine (COMPAZINE) 10 MG tablet Take 1 tablet (10 mg total) by mouth every 6 (six) hours as needed for nausea or vomiting. (Patient not taking: Reported on 05/30/2023) 30 tablet 0   triamcinolone cream (KENALOG) 0.1 % Apply 1 Application topically 2 (two) times daily as needed. 30 g 0   No current facility-administered medications for this visit.    SURGICAL HISTORY:  Past Surgical History:  Procedure Laterality Date   BRONCHIAL BRUSHINGS  05/12/2021   Procedure: BRONCHIAL BRUSHINGS;  Surgeon: Josephine Igo, DO;  Location: [facility];  Service: Pulmonary;;   BRONCHIAL DILITATION  05/12/2021   Procedure: BRONCHIAL DILITATION;  Surgeon: Josephine Igo, DO;  Location: [facility];  Service: Pulmonary;;   BRONCHIAL NEEDLE ASPIRATION BIOPSY  05/12/2021   Procedure: BRONCHIAL NEEDLE ASPIRATION BIOPSIES;  Surgeon: Josephine Igo, DO;  Location: [facility];  Service: Pulmonary;;   BRONCHIAL WASHINGS  05/12/2021   Procedure: BRONCHIAL WASHINGS;  Surgeon: Josephine Igo, DO;  Location: [facility];  Service: Pulmonary;;   CRYOTHERAPY  05/12/2021   Procedure: CRYOTHERAPY;  Surgeon: Josephine Igo, DO;  Location: [facility];  Service: Pulmonary;;   HEMOSTASIS CONTROL  05/12/2021   Procedure: HEMOSTASIS CONTROL;  Surgeon: Josephine Igo, DO;  Location: [facility];  Service: Pulmonary;;  epi injection   IR IMAGING GUIDED PORT INSERTION  06/29/2021   Spine injection     Pain control   VIDEO BRONCHOSCOPY WITH ENDOBRONCHIAL ULTRASOUND N/A 05/12/2021   Procedure: VIDEO BRONCHOSCOPY WITH ENDOBRONCHIAL ULTRASOUND;  Surgeon: Josephine Igo, DO;  Location: [facility];  Service: Pulmonary;  Laterality: N/A;   WISDOM TOOTH EXTRACTION     no anesthesia  involed    REVIEW OF SYSTEMS:  Constitutional: negative Eyes: negative Ears, nose, mouth, throat, and face: negative Respiratory: negative Cardiovascular: negative Gastrointestinal: negative Genitourinary:negative Integument/breast: negative Hematologic/lymphatic: negative Musculoskeletal:negative Neurological: negative Behavioral/Psych: negative Endocrine: negative Allergic/Immunologic: negative   PHYSICAL EXAMINATION: General appearance: alert, cooperative, and no distress Head: Normocephalic, without obvious abnormality, atraumatic Neck: no adenopathy, no JVD, supple, symmetrical, trachea midline, and thyroid not enlarged, symmetric, no tenderness/mass/nodules Lymph nodes: Cervical, supraclavicular, and axillary nodes normal. Resp: clear to auscultation bilaterally Back: symmetric, no curvature. ROM normal. No CVA tenderness. Cardio: regular rate and rhythm, S1, S2 normal, no murmur, click, rub or gallop GI: soft, non-tender; bowel sounds normal; no masses,  no organomegaly Extremities: extremities normal, atraumatic, no cyanosis or edema Neurologic: Alert and oriented X 3, normal strength and tone. Normal symmetric reflexes. Normal coordination and gait  ECOG PERFORMANCE STATUS: 0 - Asymptomatic  Blood pressure 128/66, pulse 74, temperature 98.2 F (36.8 C), temperature source Oral, resp. rate 17, height 5\' 9"  (1.753 m), weight 184 lb 6.4 oz (83.6 kg), SpO2 100%.  LABORATORY DATA: Lab Results  Component Value Date   WBC 6.1 09/26/2023   HGB 13.5 09/26/2023   HCT 41.6 09/26/2023   MCV 87.9 09/26/2023   PLT 233 09/26/2023      Chemistry      Component Value Date/Time   NA 140 09/26/2023 0850   K 4.1 09/26/2023 0850   CL 105 09/26/2023 0850   CO2 30 09/26/2023 0850   BUN 14 09/26/2023  0850   CREATININE 1.36 (H) 09/26/2023 0850      Component Value Date/Time   CALCIUM 9.3 09/26/2023 0850   ALKPHOS 110 09/26/2023 0850   AST 15 09/26/2023 0850   ALT 16  09/26/2023 0850   BILITOT 0.5 09/26/2023 0850       RADIOGRAPHIC STUDIES: CT CHEST ABDOMEN PELVIS W CONTRAST  Result Date: 10/01/2023 CLINICAL DATA:  Non-small cell lung cancer staging; * Tracking Code: BO * EXAM: CT CHEST, ABDOMEN, AND PELVIS WITH CONTRAST TECHNIQUE: Multidetector CT imaging of the chest, abdomen and pelvis was performed following the standard protocol during bolus administration of intravenous contrast. RADIATION DOSE REDUCTION: This exam was performed according to the departmental dose-optimization program which includes automated exposure control, adjustment of the mA and/or kV according to patient size and/or use of iterative reconstruction technique. CONTRAST:  80mL OMNIPAQUE IOHEXOL 300 MG/ML  SOLN COMPARISON:  CT chest, abdomen and pelvis dated June 26, 2023 FINDINGS: CT CHEST FINDINGS Cardiovascular: Normal heart size. No pericardial effusion. Normal caliber thoracic aorta with mild calcified plaque. Right chest wall port with tip of the superior cavoatrial junction. Mediastinum/Nodes: Esophagus thyroid are unremarkable. No enlarged lymph nodes seen in the chest. Lungs/Pleura: Central airways are patent. Mild emphysema. Stable left perihilar postradiation change. Small left pleural effusion with mild pleural thickening, unchanged when compared with the prior exam. No new or enlarging pulmonary nodules. Musculoskeletal: No chest wall mass or suspicious bone lesions identified. CT ABDOMEN PELVIS FINDINGS Hepatobiliary: No focal liver abnormality is seen. No gallstones, gallbladder wall thickening, or biliary dilatation. Pancreas: Unremarkable. No pancreatic ductal dilatation or surrounding inflammatory changes. Spleen: Normal in size without focal abnormality. Adrenals/Urinary Tract: Adrenal glands are unremarkable. No evidence of hydronephrosis. Possible enhancing lesion of the mid region of the left kidney measuring 1.4 x 1.4 cm on series 2, image 73. Interval decreased size and  increased attenuation of a left upper pole renal cyst, now measuring 10 mm, previously 13 mm, likely due to interval hemorrhage. Mild wall thickening of the anterior bladder, unchanged when compared with the prior exam. Stomach/Bowel: Stomach is within normal limits. Appendix appears normal. No evidence of bowel wall thickening, distention, or inflammatory changes. Vascular/Lymphatic: Aortic atherosclerosis. No enlarged abdominal or pelvic lymph nodes. Reproductive: Prostate is unremarkable. Other: No abdominal wall hernia or abnormality. No abdominopelvic ascites. Musculoskeletal: No suspicious bone lesions identified. IMPRESSION: 1. Stable left perihilar postradiation change. No evidence of local recurrence. 2. Stable small left pleural effusion. 3. No evidence of metastatic disease in the chest, abdomen or pelvis. 4. Possible enhancing lesion of the mid region of the left kidney measuring 1.4 cm, concerning for small RCC. Recommend renal protocol CT or MRI for further evaluation. 5. Additional left upper pole renal lesion is decreased in size and demonstrates increased attenuation, likely due to interval hemorrhage. Recommend attention on follow-up. 6. Aortic Atherosclerosis (ICD10-I70.0) and Emphysema (ICD10-J43.9). Electronically Signed   By: Allegra Lai M.D.   On: 10/01/2023 09:43    ASSESSMENT AND PLAN: This is a very pleasant 47 years old white male recently diagnosed with a stage IV (T2b, N2, M1 B) non-small cell lung cancer, adenocarcinoma presented with large central left lower lobe lung mass in addition to left hilar and mediastinal lymphadenopathy as well as retroperitoneal abdominal lymph node diagnosed in May 2022.  The patient underwent palliative radiotherapy to the large left lower lobe lung mass under the care of of Dr. Mitzi Hansen.  He is completed this course of treatment on June 12, 2021. MRI  of the brain showed no evidence of metastatic disease to the brain. His molecular studies by [facility]  showed no actionable mutations and PD-L1 expression of 50%. He underwent systemic chemotherapy with carboplatin for AUC of 5, Alimta 500 Mg/M2 and Keytruda 200 Mg IV every 3 weeks.  Starting from cycle #5 he is on maintenance treatment with Alimta at 400 Mg/M2 and Keytruda 200 Mg IV every 3 weeks status post 35 cycles.  The last few cycles were given with only single agent Keytruda.  He had repeat CT scan of the chest, abdomen and pelvis performed recently.  I personally and independently reviewed the scan and discussed the result with the patient today. His scan showed no concerning findings for disease progression but there was a possible enhancing lesion of the mid region of the left kidney measuring 1.4 cm concerning for small renal cell carcinoma.    Stage 4 Adenocarcinoma Completed two years of treatment with Keytruda, last dose in July 2024. Currently on observation with no new complaints or symptoms. Recent scan shows stable disease in the chest. -Continue observation.  Renal Lesion Stable 1.4 cm lesion on the kidney, previously identified and reported as a cyst. Recent radiology report raises the possibility of renal cell carcinoma. -Order MRI of the abdomen to further evaluate the renal lesion. -Based on MRI results, consider referral to urology for possible intervention if lesion is suspicious for renal cell carcinoma.  General Health Maintenance Stable weight and kidney function. No new complaints or symptoms. -Return for follow-up in 2-3 weeks to review MRI results and discuss next steps.   The patient was advised to call immediately if he has any other concerning symptoms in the interval. The patient voices understanding of current disease status and treatment options and is in agreement with the current care plan. All questions were answered. The patient knows to call the clinic with any problems, questions or concerns. We can certainly see the patient much sooner if necessary. The  total time spent in the appointment was 30 minutes.  Disclaimer: This note was dictated with voice recognition software. Similar sounding words can inadvertently be transcribed and may not be corrected upon review.

## 2023-10-04 ENCOUNTER — Telehealth: Payer: Self-pay | Admitting: *Deleted

## 2023-10-04 NOTE — Telephone Encounter (Signed)
Pt called regarding lab appt on 10/09/23. He is asking if he needs this and if he does he will need port flush with lab appt. Pt is having having Abd MRI soon.

## 2023-10-06 ENCOUNTER — Other Ambulatory Visit: Payer: Self-pay

## 2023-10-07 ENCOUNTER — Telehealth: Payer: Self-pay | Admitting: Medical Oncology

## 2023-10-07 ENCOUNTER — Other Ambulatory Visit: Payer: Self-pay | Admitting: Medical Oncology

## 2023-10-07 NOTE — Telephone Encounter (Signed)
Does Arbutus Ped want him to have contrast for MRI ?  I told him yes , Mohamed ordered MRA with and without contrast.   Does he need labs for MRI on wed?   I told him no labs needed -they were one 10/24.

## 2023-10-09 ENCOUNTER — Other Ambulatory Visit: Payer: 59

## 2023-10-10 ENCOUNTER — Ambulatory Visit (HOSPITAL_COMMUNITY)
Admission: RE | Admit: 2023-10-10 | Discharge: 2023-10-10 | Disposition: A | Discharge status: Home / Self Care | Payer: 59 | Source: Ambulatory Visit | Attending: Internal Medicine | Admitting: Internal Medicine

## 2023-10-10 ENCOUNTER — Inpatient Hospital Stay: Payer: 59 | Attending: Physician Assistant

## 2023-10-10 DIAGNOSIS — Z452 Encounter for adjustment and management of vascular access device: Secondary | ICD-10-CM | POA: Insufficient documentation

## 2023-10-10 DIAGNOSIS — N2889 Other specified disorders of kidney and ureter: Secondary | ICD-10-CM | POA: Insufficient documentation

## 2023-10-10 DIAGNOSIS — C3432 Malignant neoplasm of lower lobe, left bronchus or lung: Secondary | ICD-10-CM | POA: Insufficient documentation

## 2023-10-10 DIAGNOSIS — R590 Localized enlarged lymph nodes: Secondary | ICD-10-CM | POA: Diagnosis not present

## 2023-10-10 DIAGNOSIS — Z95828 Presence of other vascular implants and grafts: Secondary | ICD-10-CM

## 2023-10-10 DIAGNOSIS — C3490 Malignant neoplasm of unspecified part of unspecified bronchus or lung: Secondary | ICD-10-CM | POA: Diagnosis not present

## 2023-10-10 MED ORDER — SODIUM CHLORIDE 0.9% FLUSH
10.0000 mL | Freq: Once | INTRAVENOUS | Status: AC
Start: 2023-10-10 — End: 2023-10-10
  Administered 2023-10-10: 10 mL

## 2023-10-10 MED ORDER — GADOBUTROL 1 MMOL/ML IV SOLN
8.0000 mL | Freq: Once | INTRAVENOUS | Status: AC | PRN
  Administered 2023-10-10: 8 mL via INTRAVENOUS

## 2023-10-15 ENCOUNTER — Telehealth: Payer: Self-pay | Admitting: Medical Oncology

## 2023-10-15 NOTE — Telephone Encounter (Signed)
Spoke with pt about appts.  Confirmed 10/24/23 appt with Dr. Arbutus Ped @ 11.  Pt verbalized understanding.

## 2023-10-15 NOTE — Telephone Encounter (Signed)
LVM to return ,my call .

## 2023-10-23 ENCOUNTER — Encounter: Payer: Self-pay | Admitting: Physician Assistant

## 2023-10-23 ENCOUNTER — Encounter: Payer: Self-pay | Admitting: Internal Medicine

## 2023-10-23 NOTE — Telephone Encounter (Signed)
Telephone call  

## 2023-10-24 ENCOUNTER — Telehealth: Payer: Self-pay | Admitting: Medical Oncology

## 2023-10-24 ENCOUNTER — Inpatient Hospital Stay (HOSPITAL_BASED_OUTPATIENT_CLINIC_OR_DEPARTMENT_OTHER): Payer: 59 | Admitting: Internal Medicine

## 2023-10-24 VITALS — BP 114/69 | HR 71 | Temp 98.4°F | Resp 18 | Ht 69.0 in | Wt 183.6 lb

## 2023-10-24 DIAGNOSIS — Z452 Encounter for adjustment and management of vascular access device: Secondary | ICD-10-CM | POA: Diagnosis not present

## 2023-10-24 DIAGNOSIS — C3432 Malignant neoplasm of lower lobe, left bronchus or lung: Secondary | ICD-10-CM

## 2023-10-24 DIAGNOSIS — C3490 Malignant neoplasm of unspecified part of unspecified bronchus or lung: Secondary | ICD-10-CM

## 2023-10-24 NOTE — Telephone Encounter (Signed)
Spoke to [facility] -pt appt is Dec 3rd @ 1030. Pt notified.

## 2023-10-24 NOTE — Progress Notes (Signed)
[facility] [phone]   [phone]  OFFICE PROGRESS NOTE  Cameron Found, MD [address] [place] [address]  DIAGNOSIS:  Stage IV (T2b, N2, M1 B) non-small cell lung cancer, adenocarcinoma presented with large central left lower lobe lung mass with left hilar and mediastinal lymphadenopathy as well as abdominal retroperitoneal lymphadenopathy diagnosed in May 2022.  The patient also has bilateral parotid gland nodules that need close monitoring.  CARIS MOLECULAR STUDY: Results with Therapy Associations BIOMARKER METHOD ANALYTE RESULT THERAPY ASSOCIATION BIOMARKER LEVEL* .PD-L1 (22c3) IHC Protein Positive, TPS: 50% BENEFIT cemiplimab, pembrolizumab Level 1 .PD-L1 (28-8) IHC Protein Positive  1+, 50% BENEFIT nivolumab/ipilimumab combination Level 1 .TMB Seq DNA-Tumor High, 18 mut/Mb BENEFIT pembrolizumab Level 2 . alectinib, ceritinib, crizotinib, lorlatinib Level 1 . IHC Protein Negative  0 brigatinib Level 2 . ALK Seq RNA-Tumor Fusion Not Detected LACK OF BENEFIT alectinib, brigatinib, ceritinib, crizotinib, lorlatinib Level 2 .BRAF Seq DNA-Tumor Mutation Not Detected LACK OF BENEFIT dabrafenib and trametinib combination therapy, vemurafenib Level 2 .EGFR Seq DNA-Tumor Mutation Not Detected LACK OF BENEFIT erlotinib, gefitinib Level 2 .KRAS Seq DNA-Tumor Mutation Not Detected LACK OF BENEFIT sotorasib Level 2 .RET Seq RNA-Tumor Fusion Not Detected LACK OF BENEFIT pralsetinib, selpercatinib Level 2 .ROS1 Seq RNA-Tumor Fusion Not Detected LACK OF BENEFIT ceritinib, crizotinib, entrectinib, lorlatinib Level 2 . CNA-Seq DNA-Tumor Amplification Not Detected .MET Seq RNA-Tumor Variant Transcript Not Detected LACK OF BENEFIT crizotinib Level 3  PRIOR THERAPY:  1) Palliative radiotherapy to the large central left lower lobe lung mass under the care of Dr. Mitzi Hansen. 2) Systemic chemotherapy with carboplatin for AUC of 5,  Alimta 500 Mg/M2 and possibly Keytruda 200 Mg IV every 3 weeks.  First dose expected June 14, 2021.  Starting from cycle #5 the patient is on maintenance treatment with Alimta and Keytruda every 3 weeks.  Status post 35 cycles of treatment.  Starting from cycle #12 the dose of Alimta to 400 Mg/M2 because of his increasing fatigue and side effects.  Alimta was discontinued secondary to renal insufficiency.  CURRENT THERAPY: Observation.  INTERVAL HISTORY: Cameron Harper 47 y.o. male returns to the clinic today for follow-up visit. Discussed the use of AI scribe software for clinical note transcription with the patient, who gave verbal consent to proceed.  History of Present Illness   The patient, a 47 year old with a history of stage 4 adenocarcinoma, was treated with Carboplatin, Alimta, and Keytruda for 4 cycles. The patient remained on Alimta and Keytruda for a little longer, but Alimta was discontinued due to kidney issues. The patient continued with Keytruda for two years and has been under observation for the past three months since discontinuation of Keytruda.  During this period of observation, a spot was identified in the left kidney on a scan, raising concerns of potential kidney cancer. An MRI confirmed the presence of the spot, measuring 1.8 by 1.7 cm, in the lateral mid portion of the left kidney.  The patient denies any symptoms such as blood in the urine and reports feeling well overall.        MEDICAL HISTORY: Past Medical History:  Diagnosis Date   Chronic back pain    Eczema    GERD (gastroesophageal reflux disease)    Lung cancer (HCC)     ALLERGIES:  is allergic to penicillins and zofran [ondansetron].  MEDICATIONS:  Current Outpatient Medications  Medication Sig Dispense Refill   acetaminophen (TYLENOL) 500 MG tablet Take 500-1,000 mg  by mouth every 6 (six) hours as needed (for back pain.).     lidocaine-prilocaine (EMLA) cream Apply 1 application topically as  needed. (Patient not taking: Reported on 05/30/2023) 30 g 2   prochlorperazine (COMPAZINE) 10 MG tablet Take 1 tablet (10 mg total) by mouth every 6 (six) hours as needed for nausea or vomiting. (Patient not taking: Reported on 05/30/2023) 30 tablet 0   triamcinolone cream (KENALOG) 0.1 % Apply 1 Application topically 2 (two) times daily as needed. 30 g 0   No current facility-administered medications for this visit.    SURGICAL HISTORY:  Past Surgical History:  Procedure Laterality Date   BRONCHIAL BRUSHINGS  05/12/2021   Procedure: BRONCHIAL BRUSHINGS;  Surgeon: Josephine Igo, DO;  Location: [facility];  Service: Pulmonary;;   BRONCHIAL DILITATION  05/12/2021   Procedure: BRONCHIAL DILITATION;  Surgeon: Josephine Igo, DO;  Location: [facility];  Service: Pulmonary;;   BRONCHIAL NEEDLE ASPIRATION BIOPSY  05/12/2021   Procedure: BRONCHIAL NEEDLE ASPIRATION BIOPSIES;  Surgeon: Josephine Igo, DO;  Location: [facility];  Service: Pulmonary;;   BRONCHIAL WASHINGS  05/12/2021   Procedure: BRONCHIAL WASHINGS;  Surgeon: Josephine Igo, DO;  Location: [facility];  Service: Pulmonary;;   CRYOTHERAPY  05/12/2021   Procedure: CRYOTHERAPY;  Surgeon: Josephine Igo, DO;  Location: [facility];  Service: Pulmonary;;   HEMOSTASIS CONTROL  05/12/2021   Procedure: HEMOSTASIS CONTROL;  Surgeon: Josephine Igo, DO;  Location: [facility];  Service: Pulmonary;;  epi injection   IR IMAGING GUIDED PORT INSERTION  06/29/2021   Spine injection     Pain control   VIDEO BRONCHOSCOPY WITH ENDOBRONCHIAL ULTRASOUND N/A 05/12/2021   Procedure: VIDEO BRONCHOSCOPY WITH ENDOBRONCHIAL ULTRASOUND;  Surgeon: Josephine Igo, DO;  Location: [facility];  Service: Pulmonary;  Laterality: N/A;   WISDOM TOOTH EXTRACTION     no anesthesia involed    REVIEW OF SYSTEMS:  Constitutional: negative Eyes: negative Ears, nose, mouth, throat, and face: negative Respiratory: negative Cardiovascular:  negative Gastrointestinal: negative Genitourinary:negative Integument/breast: negative Hematologic/lymphatic: negative Musculoskeletal:negative Neurological: negative Behavioral/Psych: negative Endocrine: negative Allergic/Immunologic: negative   PHYSICAL EXAMINATION: General appearance: alert, cooperative, and no distress Head: Normocephalic, without obvious abnormality, atraumatic Neck: no adenopathy, no JVD, supple, symmetrical, trachea midline, and thyroid not enlarged, symmetric, no tenderness/mass/nodules Lymph nodes: Cervical, supraclavicular, and axillary nodes normal. Resp: clear to auscultation bilaterally Back: symmetric, no curvature. ROM normal. No CVA tenderness. Cardio: regular rate and rhythm, S1, S2 normal, no murmur, click, rub or gallop GI: soft, non-tender; bowel sounds normal; no masses,  no organomegaly Extremities: extremities normal, atraumatic, no cyanosis or edema Neurologic: Alert and oriented X 3, normal strength and tone. Normal symmetric reflexes. Normal coordination and gait  ECOG PERFORMANCE STATUS: 0 - Asymptomatic  Blood pressure 114/69, pulse 71, temperature 98.4 F (36.9 C), temperature source Temporal, resp. rate 18, height 5\' 9"  (1.753 m), weight 183 lb 9.6 oz (83.3 kg), SpO2 98%.  LABORATORY DATA: Lab Results  Component Value Date   WBC 6.1 09/26/2023   HGB 13.5 09/26/2023   HCT 41.6 09/26/2023   MCV 87.9 09/26/2023   PLT 233 09/26/2023      Chemistry      Component Value Date/Time   NA 140 09/26/2023 0850   K 4.1 09/26/2023 0850   CL 105 09/26/2023 0850   CO2 30 09/26/2023 0850   BUN 14 09/26/2023 0850   CREATININE 1.36 (H) 09/26/2023 0850      Component Value Date/Time  CALCIUM 9.3 09/26/2023 0850   ALKPHOS 110 09/26/2023 0850   AST 15 09/26/2023 0850   ALT 16 09/26/2023 0850   BILITOT 0.5 09/26/2023 0850       RADIOGRAPHIC STUDIES: MR Abdomen W Wo Contrast  Result Date: 10/14/2023 CLINICAL DATA:  Characterize  suspected left renal mass in the midportion of the left kidney incidentally identified on lung cancer restaging EXAM: MRI ABDOMEN WITHOUT AND WITH CONTRAST TECHNIQUE: Multiplanar multisequence MR imaging of the abdomen was performed both before and after the administration of intravenous contrast. CONTRAST:  8mL GADAVIST GADOBUTROL 1 MMOL/ML IV SOLN COMPARISON:  CT chest abdomen pelvis, 09/26/2023 FINDINGS: Lower chest: Loculated left pleural effusion. Hepatobiliary: No solid liver abnormality is seen. No gallstones, gallbladder wall thickening, or biliary dilatation. Pancreas: Unremarkable. No pancreatic ductal dilatation or surrounding inflammatory changes. Spleen: Normal in size without significant abnormality. Adrenals/Urinary Tract: Adrenal glands are unremarkable. Contrast enhancing mass within the cortex of the lateral midportion of the left kidney measuring 1.8 x 1.7 cm (series 16, image 64). Benign hemorrhagic or proteinaceous cyst of the medial superior pole of the left kidney (series 14, image 42) kidneys are otherwise normal without obvious calculi or hydronephrosis. Stomach/Bowel: Stomach is within normal limits. No evidence of bowel wall thickening, distention, or inflammatory changes. Vascular/Lymphatic: No significant vascular findings are present. Enlarged gastrohepatic ligament lymph nodes measuring up to 1.8 x 0.9 cm (series 16, image 24). Other: No abdominal wall hernia or abnormality. No ascites. Musculoskeletal: No acute or significant osseous findings. IMPRESSION: 1. Contrast enhancing mass within the cortex of the lateral midportion of the left kidney measuring 1.8 x 1.7 cm, consistent with renal cell carcinoma. 2. No evidence of renal vein invasion or specific findings of metastatic disease in the abdomen. 3. Unchanged, enlarged gastrohepatic ligament lymph nodes measuring up to 1.8 x 0.9 cm, nonspecific in the setting of lung cancer. 4. Loculated left pleural effusion. These results will be  called to the ordering clinician or representative by the Radiologist Assistant, and communication documented in the PACS or [employer]. Electronically Signed   By: Jearld Lesch M.D.   On: 10/14/2023 19:21   CT CHEST ABDOMEN PELVIS W CONTRAST  Result Date: 10/01/2023 CLINICAL DATA:  Non-small cell lung cancer staging; * Tracking Code: BO * EXAM: CT CHEST, ABDOMEN, AND PELVIS WITH CONTRAST TECHNIQUE: Multidetector CT imaging of the chest, abdomen and pelvis was performed following the standard protocol during bolus administration of intravenous contrast. RADIATION DOSE REDUCTION: This exam was performed according to the departmental dose-optimization program which includes automated exposure control, adjustment of the mA and/or kV according to patient size and/or use of iterative reconstruction technique. CONTRAST:  80mL OMNIPAQUE IOHEXOL 300 MG/ML  SOLN COMPARISON:  CT chest, abdomen and pelvis dated June 26, 2023 FINDINGS: CT CHEST FINDINGS Cardiovascular: Normal heart size. No pericardial effusion. Normal caliber thoracic aorta with mild calcified plaque. Right chest wall port with tip of the superior cavoatrial junction. Mediastinum/Nodes: Esophagus thyroid are unremarkable. No enlarged lymph nodes seen in the chest. Lungs/Pleura: Central airways are patent. Mild emphysema. Stable left perihilar postradiation change. Small left pleural effusion with mild pleural thickening, unchanged when compared with the prior exam. No new or enlarging pulmonary nodules. Musculoskeletal: No chest wall mass or suspicious bone lesions identified. CT ABDOMEN PELVIS FINDINGS Hepatobiliary: No focal liver abnormality is seen. No gallstones, gallbladder wall thickening, or biliary dilatation. Pancreas: Unremarkable. No pancreatic ductal dilatation or surrounding inflammatory changes. Spleen: Normal in size without focal abnormality. Adrenals/Urinary Tract: Adrenal  glands are unremarkable. No evidence of hydronephrosis.  Possible enhancing lesion of the mid region of the left kidney measuring 1.4 x 1.4 cm on series 2, image 73. Interval decreased size and increased attenuation of a left upper pole renal cyst, now measuring 10 mm, previously 13 mm, likely due to interval hemorrhage. Mild wall thickening of the anterior bladder, unchanged when compared with the prior exam. Stomach/Bowel: Stomach is within normal limits. Appendix appears normal. No evidence of bowel wall thickening, distention, or inflammatory changes. Vascular/Lymphatic: Aortic atherosclerosis. No enlarged abdominal or pelvic lymph nodes. Reproductive: Prostate is unremarkable. Other: No abdominal wall hernia or abnormality. No abdominopelvic ascites. Musculoskeletal: No suspicious bone lesions identified. IMPRESSION: 1. Stable left perihilar postradiation change. No evidence of local recurrence. 2. Stable small left pleural effusion. 3. No evidence of metastatic disease in the chest, abdomen or pelvis. 4. Possible enhancing lesion of the mid region of the left kidney measuring 1.4 cm, concerning for small RCC. Recommend renal protocol CT or MRI for further evaluation. 5. Additional left upper pole renal lesion is decreased in size and demonstrates increased attenuation, likely due to interval hemorrhage. Recommend attention on follow-up. 6. Aortic Atherosclerosis (ICD10-I70.0) and Emphysema (ICD10-J43.9). Electronically Signed   By: Allegra Lai M.D.   On: 10/01/2023 09:43    ASSESSMENT AND PLAN: This is a very pleasant 47 years old white male recently diagnosed with a stage IV (T2b, N2, M1 B) non-small cell lung cancer, adenocarcinoma presented with large central left lower lobe lung mass in addition to left hilar and mediastinal lymphadenopathy as well as retroperitoneal abdominal lymph node diagnosed in May 2022.  The patient underwent palliative radiotherapy to the large left lower lobe lung mass under the care of of Dr. Mitzi Hansen.  He is completed this course  of treatment on June 12, 2021. MRI of the brain showed no evidence of metastatic disease to the brain. His molecular studies by [facility] showed no actionable mutations and PD-L1 expression of 50%. He underwent systemic chemotherapy with carboplatin for AUC of 5, Alimta 500 Mg/M2 and Keytruda 200 Mg IV every 3 weeks.  Starting from cycle #5 he is on maintenance treatment with Alimta at 400 Mg/M2 and Keytruda 200 Mg IV every 3 weeks status post 35 cycles.  The last few cycles were given with only single agent Keytruda.  He had repeat CT scan of the chest, abdomen and pelvis performed recently.  I personally and independently reviewed the scan and discussed the result with the patient today. His scan showed no concerning findings for disease progression but there was a possible enhancing lesion of the mid region of the left kidney measuring 1.4 cm concerning for small renal cell carcinoma.  On review of several previous CT scan of the abdomen and pelvis with similar solid density was seen in the left kidney. The patient had MRI of the abdomen performed recently.  I personally and independently reviewed the scans and discussed the result with the patient today.    Stage IV Lung Adenocarcinoma Diagnosed in May 2022. Initially treated with carboplatin, pemetrexed, and pembrolizumab for four years. Continued on pembrolizumab monotherapy until May/June 2024 due to stable disease and renal issues. Recent imaging shows stable disease with a persistent abdominal lymph node. No new systemic symptoms. Discussed potential radiation therapy if the kidney mass is metastatic. Patient concerned about stopping pembrolizumab and the kidney mass. - Continue observation - Monitor abdominal lymph node  Suspicious Renal Mass Newly identified 1.8 x 1.7 cm mass in  the lateral mid portion of the left kidney. Initially described as a cyst, recent MRI suggests a solid mass, possibly kidney cancer. Differential diagnosis includes new  primary kidney cancer versus metastasis from lung cancer. No hematuria or urinary symptoms. Discussed surgical excision or cryoablation. Patient reassured about the mass's stability in previous scans. - Refer to urology for further evaluation and management - Discuss surgical excision or cryoablation with urology  Follow-up - Ensure referral to [facility] is completed - Follow up with urology to discuss management options - Monitor for new symptoms or changes in condition. - follow up visit in 3 months with repeat CT scan of the chest, Abdomen and Pelvis.   The patient was advised to call immediately if he has any concerning symptoms in the interval. The patient voices understanding of current disease status and treatment options and is in agreement with the current care plan. All questions were answered. The patient knows to call the clinic with any problems, questions or concerns. We can certainly see the patient much sooner if necessary. The total time spent in the appointment was 30 minutes.  Disclaimer: This note was dictated with voice recognition software. Similar sounding words can inadvertently be transcribed and may not be corrected upon review.

## 2023-11-06 ENCOUNTER — Telehealth: Payer: Self-pay | Admitting: Medical Oncology

## 2023-11-06 NOTE — Telephone Encounter (Addendum)
He saw a provider yesterday . " I don't want to see him anymore. He asked me if anybody has talked about my life expectancy". Cameron Harper said this came up several times in their conversation. The provider told him he will talk to [facility] and said he will get back in touch with him.   " I do not want this provider to get back with me and  I do not want him involved in any way". He would like to see another provider.

## 2023-11-12 ENCOUNTER — Telehealth: Payer: Self-pay | Admitting: Medical Oncology

## 2023-11-12 ENCOUNTER — Encounter: Payer: Self-pay | Admitting: Medical Oncology

## 2023-11-12 NOTE — Telephone Encounter (Signed)
Dr. Asa Lente response re urologist given to Kathlene November and he will call [facility] and speak to them about getting another provider.

## 2023-11-15 ENCOUNTER — Other Ambulatory Visit: Payer: Self-pay | Admitting: Urology

## 2023-11-15 DIAGNOSIS — N2889 Other specified disorders of kidney and ureter: Secondary | ICD-10-CM

## 2023-11-16 ENCOUNTER — Other Ambulatory Visit: Payer: Self-pay

## 2023-11-19 ENCOUNTER — Telehealth: Payer: Self-pay | Admitting: Medical Oncology

## 2023-11-19 NOTE — Telephone Encounter (Signed)
Dr. Cardell Peach met with tumor board re his case. Pt going to have a tumor ablation in IR.   IR consult  12/23 .  Requests Dr. Asa Lente input.

## 2023-11-20 ENCOUNTER — Encounter: Payer: Self-pay | Admitting: Medical Oncology

## 2023-11-20 ENCOUNTER — Encounter: Payer: Self-pay | Admitting: Physician Assistant

## 2023-11-20 ENCOUNTER — Encounter: Payer: Self-pay | Admitting: Internal Medicine

## 2023-11-25 ENCOUNTER — Ambulatory Visit
Admission: RE | Admit: 2023-11-25 | Discharge: 2023-11-25 | Disposition: A | Discharge status: Home / Self Care | Payer: 59 | Source: Ambulatory Visit | Attending: Urology

## 2023-11-25 DIAGNOSIS — N2889 Other specified disorders of kidney and ureter: Secondary | ICD-10-CM

## 2023-11-25 HISTORY — PX: IR RADIOLOGIST EVAL & MGMT: IMG5224

## 2023-11-25 NOTE — Consult Note (Signed)
Chief Complaint: Left renal mass  Referring Physician(s): Gay,Matthew R  Oncology: Dr. Arbutus Ped PCP: Dr. Phillips Odor  History of Present Illness: Cameron Harper is a 47 y.o. male presenting today to [facility] clinic, kindly referred by Dr. Cardell Peach, to discuss his left renal mass, and treatment options.   He is here today with his wife for the interview.   Cameron Harper is now in remission for non-small cell lung cancer, adenocarcinoma, Dr. Arbutus Ped treating him.   His diagnosis/therapy was initiated May 2022. His last note from Dr. Arbutus Ped 10/24/23: ______________________ DIAGNOSIS:  Stage IV (T2b, N2, M1 B) non-small cell lung cancer, adenocarcinoma presented with large central left lower lobe lung mass with left hilar and mediastinal lymphadenopathy as well as abdominal retroperitoneal lymphadenopathy diagnosed in May 2022.  PRIOR THERAPY:  1) Palliative radiotherapy to the large central left lower lobe lung mass under the care of Dr. Mitzi Hansen. 2) Systemic chemotherapy with carboplatin for AUC of 5, Alimta 500 Mg/M2 and possibly Keytruda 200 Mg IV every 3 weeks.  First dose expected June 14, 2021.  Starting from cycle #5 the patient is on maintenance treatment with Alimta and Keytruda every 3 weeks.  Status post 35 cycles of treatment.  Starting from cycle #12 the dose of Alimta to 400 Mg/M2 because of his increasing fatigue and side effects.  Alimta was discontinued secondary to renal insufficiency. CURRENT THERAPY: Observation. _____________________________  On his surveillance studies, a left renal mass was identified. While first described 09/26/23, this was likely present dating to CT 10/16/21.  In the interval, the CT scans were performed without contrast, secondary to renal insufficiency.   On recent CT and Cameron the estimated size is 1.8cm x 1.7cm.   CT 09/26/23      Cameron 10/10/23    Cameron Harper denies any hematuria.  Denies flank pain. Currently feeling fine, with no symptoms.    He is in surveillance mode, remission. He understands his surveillance CT schedule is about every 3 months.   Labs: WBC: 6.1 H/H: 13.5/41.6 Platelet: 233 Na: 140 K: 4.1 Cr: 1.36 BUN: 14 Tbili: 0.5 Alk phos: 110 AST: 15 ALT: 16    Past Medical History:  Diagnosis Date   Chronic back pain    Eczema    GERD (gastroesophageal reflux disease)    Lung cancer (HCC)     Past Surgical History:  Procedure Laterality Date   BRONCHIAL BRUSHINGS  05/12/2021   Procedure: BRONCHIAL BRUSHINGS;  Surgeon: Josephine Igo, DO;  [facility];  Service: Pulmonary;;   BRONCHIAL DILITATION  05/12/2021   Procedure: BRONCHIAL DILITATION;  Surgeon: Josephine Igo, DO;  [facility];  Service: Pulmonary;;   BRONCHIAL NEEDLE ASPIRATION BIOPSY  05/12/2021   Procedure: BRONCHIAL NEEDLE ASPIRATION BIOPSIES;  Surgeon: Josephine Igo, DO;  [facility];  Service: Pulmonary;;   BRONCHIAL WASHINGS  05/12/2021   Procedure: BRONCHIAL WASHINGS;  Surgeon: Josephine Igo, DO;  [facility];  Service: Pulmonary;;   CRYOTHERAPY  05/12/2021   Procedure: CRYOTHERAPY;  Surgeon: Josephine Igo, DO;  [facility];  Service: Pulmonary;;   HEMOSTASIS CONTROL  05/12/2021   Procedure: HEMOSTASIS CONTROL;  Surgeon: Josephine Igo, DO;  [facility];  Service: Pulmonary;;  epi injection   IR IMAGING GUIDED PORT INSERTION  06/29/2021   Spine injection     Pain control   VIDEO BRONCHOSCOPY WITH ENDOBRONCHIAL ULTRASOUND N/A 05/12/2021   Procedure: VIDEO BRONCHOSCOPY WITH ENDOBRONCHIAL ULTRASOUND;  Surgeon: Audie Box  L, DO;  [facility];  Service: Pulmonary;  Laterality: N/A;   WISDOM TOOTH EXTRACTION     no anesthesia involed    Allergies: Penicillins and Zofran [ondansetron]  Medications: Prior to Admission medications   Medication Sig Start Date End Date Taking? Authorizing Provider  acetaminophen (TYLENOL) 500 MG tablet Take  500-1,000 mg by mouth every 6 (six) hours as needed (for back pain.).    [provider]  lidocaine-prilocaine (EMLA) cream Apply 1 application topically as needed. Patient not taking: Reported on 05/30/2023 06/30/21   Heilingoetter, Cassandra L, PA-C  prochlorperazine (COMPAZINE) 10 MG tablet Take 1 tablet (10 mg total) by mouth every 6 (six) hours as needed for nausea or vomiting. Patient not taking: Reported on 05/30/2023 02/05/22   Si Gaul, MD  triamcinolone cream (KENALOG) 0.1 % Apply 1 Application topically 2 (two) times daily as needed. 04/18/23   Si Gaul, MD     Family History  Problem Relation Age of Onset   Heart attack Maternal Grandmother    CAD Maternal Grandmother    Diabetes Maternal Grandfather     Social History   Socioeconomic History   Marital status: Married    Spouse name: Not on file   Number of children: Not on file   Years of education: Not on file   Highest education level: Not on file  Occupational History   Not on file  Tobacco Use   Smoking status: Former    Current packs/day: 0.00    Average packs/day: 1 pack/day for 25.0 years (25.0 ttl pk-yrs)    Types: Cigarettes    Start date: 04/18/1996    Quit date: 04/18/2021    Years since quitting: 2.6   Smokeless tobacco: Never   Tobacco comments:    currently 7 cigs per day/ 04/18/21//lmr  Vaping Use   Vaping status: Not on file  Substance and Sexual Activity   Alcohol use: No   Drug use: No   Sexual activity: Not on file  Other Topics Concern   Not on file  Social History Narrative   Not on file   Social Drivers of Health   Financial Resource Strain: Low Risk  (05/19/2021)   Overall Financial Resource Strain (CARDIA)    Difficulty of Paying Living Expenses: Not very hard  Food Insecurity: No Food Insecurity (05/19/2021)   Hunger Vital Sign    Worried About Running Out of Food in the Last Year: Never true    Ran Out of Food in the Last Year: Never true  Transportation  Needs: No Transportation Needs (05/19/2021)   PRAPARE - Administrator, Civil Service (Medical): No    Lack of Transportation (Non-Medical): No  Physical Activity: Not on file  Stress: Not on file  Social Connections: Socially Integrated (05/19/2021)   Social Connection and Isolation Panel [NHANES]    Frequency of Communication with Friends and Family: More than three times a week    Frequency of Social Gatherings with Friends and Family: More than three times a week    Attends Religious Services: More than 4 times per year    Active Member of [employer] or Organizations: Yes    Attends Engineer, structural: More than 4 times per year    Marital Status: Married    ECOG Status: 0 - Asymptomatic  Review of Systems: A 12 point ROS discussed and pertinent positives are indicated in the HPI above.  All other systems are negative.  Review of Systems  Vital Signs: BP 114/64 (BP Location: Left Arm)   Pulse 74   Temp 98 F (36.7 C) (Oral)   Resp 18   SpO2 96%     Physical Exam General: 47 yo male appearing stated age.  Well-developed, well-nourished.  No distress. HEENT: Atraumatic, normocephalic.  Conjugate gaze, extra-ocular motor intact. No scleral icterus or scleral injection. No lesions on external ears, nose, lips, or gums.  Oral mucosa moist, pink.  Neck: Symmetric with no goiter enlargement.  Chest/Lungs:  Symmetric chest with inspiration/expiration.  No labored breathing.  Clear to auscultation with no wheezes, rhonchi, or rales.  Heart:  RRR, with no third heart sounds appreciated. No JVD appreciated.  Abdomen:  Soft, NT/ND, with + bowel sounds.   Genito-urinary: Deferred Neurologic: Alert & Oriented to person, place, and time.   Normal affect and insight.  Appropriate questions.  Moving all 4 extremities with gross sensory intact.  Pulse Exam:  bilateral radial pulse palpable Extremities: No swelling       Mallampati Score:     Imaging: No results  found.  Labs:  CBC: Recent Labs    05/10/23 0926 05/30/23 0749 06/26/23 1026 09/26/23 0850  WBC 6.4 6.7 6.0 6.1  HGB 12.5* 13.1 13.3 13.5  HCT 39.0 40.1 40.6 41.6  PLT 271 259 243 233    COAGS: No results for input(s): "INR", "APTT" in the last 8760 hours.  BMP: Recent Labs    05/10/23 0926 05/30/23 0749 06/26/23 1026 09/26/23 0850  NA 140 140 140 140  K 3.9 4.1 4.2 4.1  CL 107 107 108 105  CO2 28 27 29 30   GLUCOSE 125* 91 68* 86  BUN 18 19 16 14   CALCIUM 9.1 9.2 9.4 9.3  CREATININE 1.42* 1.32* 1.22 1.36*  GFRNONAA >60 >60 >60 >60    LIVER FUNCTION TESTS: Recent Labs    05/10/23 0926 05/30/23 0749 06/26/23 1026 09/26/23 0850  BILITOT 0.3 0.3 0.3 0.5  AST 14* 12* 14* 15  ALT 15 13 15 16   ALKPHOS 107 110 116 110  PROT 7.2 7.3 7.0 7.3  ALBUMIN 3.7 3.6 3.9 4.0    TUMOR MARKERS: No results for input(s): "AFPTM", "CEA", "CA199", "CHROMGRNA" in the last 8760 hours.  Assessment and Plan:  Cameron Harper is 47 yo male with history of treated left lung, Stage IV (T2b, N2, M1 B) non-small cell lung cancer, adenocarcinoma, with a left renal lesion.   The left renal lesion does not have the appearance of met, instead appears to be a primary renal tumor.   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   The nephrometry score is calculated 8p.  ~11% risk for injury/complication for surgery.  He has an opinion from Urology, with notes reviewed regarding the thoughts on his prognosis, and his surgical risk.  I do agree with Urology that this most likely will turn out to be an RCC, and consideration of treatment is reasonable given remission status.  I had a discussion with Cameron Harper and his wife regarding small renal masses, meaning on imaging, and role for surveillance, surgery, and image guided ablation.  I did explain that treatment versus observation, versus biopsy has a lot to do with the patient's desire for treatment,  and that certain in usual circumstances a 47 yo is a very reasonable candidate for treatment. He does voice to me his desire to be cancer free, and to live a fulfilling life  in his circumstance.   We discussed the possible role of biopsy here, which could be performed at the time of the treatment.  I think certainly important to know the tissue type/path diagnosis, given his alternative lung CA diagnosis, and avoid any confusion.  Specific risks for ablation: bleeding, infection, injury to local structures, need for another procedures/surgery, need for hospitalization, organ failure, anesthesia risks, cardiopulmonary collapse, death.  Ultimately after our discussion, he wishes to proceed with image guided ablation for therapy.   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 Loreta Ave at [facility] in January/February, at his convenience.  With [facility]. - Continue current care   Thank you for this interesting consult.  I greatly enjoyed meeting JAMAURY CUDDIHY and look forward to participating in their care.  A copy of this report was sent to the requesting provider on this date.  Electronically Signed: Gilmer Mor 11/25/2023, 11:55 AM   I spent a total of  60 Minutes   in face to face in clinical consultation, greater than 50% of which was counseling/coordinating care for left renal tumor, possible renal ablation and biopsy, cryotherapy

## 2023-11-27 ENCOUNTER — Other Ambulatory Visit: Payer: Self-pay

## 2023-12-06 ENCOUNTER — Other Ambulatory Visit (HOSPITAL_COMMUNITY): Payer: Self-pay | Admitting: Interventional Radiology

## 2023-12-06 ENCOUNTER — Other Ambulatory Visit: Payer: Self-pay

## 2023-12-06 DIAGNOSIS — N2889 Other specified disorders of kidney and ureter: Secondary | ICD-10-CM

## 2023-12-07 ENCOUNTER — Encounter: Payer: Self-pay | Admitting: Physician Assistant

## 2023-12-07 ENCOUNTER — Encounter: Payer: Self-pay | Admitting: Internal Medicine

## 2023-12-09 ENCOUNTER — Other Ambulatory Visit: Payer: Self-pay

## 2023-12-12 ENCOUNTER — Encounter: Payer: Self-pay | Admitting: Physician Assistant

## 2023-12-12 ENCOUNTER — Encounter: Payer: Self-pay | Admitting: Internal Medicine

## 2023-12-16 ENCOUNTER — Other Ambulatory Visit (HOSPITAL_COMMUNITY): Payer: Self-pay | Admitting: Radiology

## 2023-12-16 DIAGNOSIS — N2889 Other specified disorders of kidney and ureter: Secondary | ICD-10-CM

## 2023-12-17 NOTE — Progress Notes (Addendum)
Anesthesia Review:  PCP: [name]  Cardiologist : none  Oncology- DR Mohamed  Chest x-ray : 12/25/23 CT Chest- 10/01/23  EKG : 12/25/23  Echo : 2022  Stress test: Cardiac Cath :  Activity level: can do a flight of stairs wthout difficutly  Sleep Study/ CPAP : none  Fasting Blood Sugar :      / Checks Blood Sugar -- times a day:   Blood Thinner/ Instructions /Last Dose: ASA / Instructions/ Last Dose :    PT to  come in on 12/25/23 to have labs and EKG and cxr done prior to procedure on 01/01/24. PT has PORT.  PT is a difficult stick per pt.  PT knows to hydrate himself prior to labs on 12/25/23.  Lab appt set up.  Med hx and instructioiin completed on 12/23/23.  PT to pick up bag with instructons and soap on 12/25/23.     PT cam in for labs and to pick up instructions and hibiclens on 12/25/23.  Labs, EKG and cxr done on 12/25/23.     PORT- right anterior chest  PT completed chemo 07/2023.

## 2023-12-20 ENCOUNTER — Encounter: Payer: Self-pay | Admitting: Internal Medicine

## 2023-12-23 ENCOUNTER — Other Ambulatory Visit: Payer: Self-pay

## 2023-12-23 ENCOUNTER — Encounter: Payer: Self-pay | Admitting: Physician Assistant

## 2023-12-23 ENCOUNTER — Encounter: Payer: Self-pay | Admitting: Internal Medicine

## 2023-12-23 ENCOUNTER — Encounter (HOSPITAL_COMMUNITY): Payer: Self-pay

## 2023-12-23 ENCOUNTER — Encounter (HOSPITAL_COMMUNITY)
Admission: RE | Admit: 2023-12-23 | Discharge: 2023-12-23 | Disposition: A | Discharge status: Home / Self Care | Payer: HMO | Source: Ambulatory Visit | Attending: Interventional Radiology | Admitting: Interventional Radiology

## 2023-12-25 ENCOUNTER — Encounter (HOSPITAL_COMMUNITY)
Admission: RE | Admit: 2023-12-25 | Discharge: 2023-12-25 | Disposition: A | Discharge status: Home / Self Care | Payer: HMO | Source: Ambulatory Visit | Attending: Interventional Radiology

## 2023-12-25 ENCOUNTER — Ambulatory Visit (HOSPITAL_COMMUNITY)
Admission: RE | Admit: 2023-12-25 | Discharge: 2023-12-25 | Disposition: A | Discharge status: Home / Self Care | Payer: HMO | Source: Ambulatory Visit | Attending: Radiology | Admitting: Radiology

## 2023-12-25 DIAGNOSIS — Z01818 Encounter for other preprocedural examination: Secondary | ICD-10-CM | POA: Diagnosis not present

## 2023-12-25 DIAGNOSIS — N2889 Other specified disorders of kidney and ureter: Secondary | ICD-10-CM | POA: Diagnosis not present

## 2023-12-25 DIAGNOSIS — J90 Pleural effusion, not elsewhere classified: Secondary | ICD-10-CM | POA: Diagnosis not present

## 2023-12-25 LAB — BASIC METABOLIC PANEL
Anion gap: 8 (ref 5–15)
BUN: 17 mg/dL (ref 6–20)
CO2: 26 mmol/L (ref 22–32)
Calcium: 9.3 mg/dL (ref 8.9–10.3)
Chloride: 105 mmol/L (ref 98–111)
Creatinine, Ser: 1.16 mg/dL (ref 0.61–1.24)
GFR, Estimated: >60 mL/min (ref >60)
Glucose, Bld: 87 mg/dL (ref 70–99)
Potassium: 4.4 mmol/L (ref 3.5–5.1)
Sodium: 139 mmol/L (ref 135–145)

## 2023-12-25 LAB — CBC WITH DIFFERENTIAL/PLATELET
Abs Immature Granulocytes: 0.02 10*3/uL (ref 0.00–0.07)
Basophils Absolute: 0.1 10*3/uL (ref 0.0–0.1)
Basophils Relative: 1 %
Eosinophils Absolute: 0.3 10*3/uL (ref 0.0–0.5)
Eosinophils Relative: 5 %
HCT: 48.3 % (ref 39.0–52.0)
Hemoglobin: 15.1 g/dL (ref 13.0–17.0)
Immature Granulocytes: 0 %
Lymphocytes Relative: 17 %
Lymphs Abs: 1.1 10*3/uL (ref 0.7–4.0)
MCH: 28.6 pg (ref 26.0–34.0)
MCHC: 31.3 g/dL (ref 30.0–36.0)
MCV: 91.5 fL (ref 80.0–100.0)
Monocytes Absolute: 0.7 10*3/uL (ref 0.1–1.0)
Monocytes Relative: 11 %
Neutro Abs: 3.9 10*3/uL (ref 1.7–7.7)
Neutrophils Relative %: 66 %
Platelets: 246 10*3/uL (ref 150–400)
RBC: 5.28 MIL/uL (ref 4.22–5.81)
RDW: 13.9 % (ref 11.5–15.5)
WBC: 6 10*3/uL (ref 4.0–10.5)
nRBC: 0 % (ref 0.0–0.2)

## 2023-12-25 LAB — PROTIME-INR
INR: 1 (ref 0.8–1.2)
Prothrombin Time: 13.1 s (ref 11.4–15.2)

## 2023-12-26 NOTE — Anesthesia Preprocedure Evaluation (Addendum)
Anesthesia Evaluation  Patient identified by MRN, date of birth, ID band Patient awake    Reviewed: Allergy & Precautions, NPO status , Patient's Chart, lab work & pertinent test results  Airway Mallampati: III  TM Distance: >3 FB Neck ROM: Full    Dental no notable dental hx.    Pulmonary former smoker Lung CA   Pulmonary exam normal        Cardiovascular negative cardio ROS Normal cardiovascular exam     Neuro/Psych negative neurological ROS  negative psych ROS   GI/Hepatic negative GI ROS, Neg liver ROS,,,  Endo/Other  negative endocrine ROS    Renal/GU Renal disease     Musculoskeletal Chronic back pain   Abdominal   Peds  Hematology negative hematology ROS (+)   Anesthesia Other Findings LEFT RENAL MASS  Reproductive/Obstetrics                             Anesthesia Physical Anesthesia Plan  ASA: 2  Anesthesia Plan: General   Post-op Pain Management:    Induction: Intravenous  PONV Risk Score and Plan: 2 and Ondansetron, Dexamethasone, Midazolam and Treatment may vary due to age or medical condition  Airway Management Planned: Oral ETT  Additional Equipment:   Intra-op Plan:   Post-operative Plan: Extubation in OR  Informed Consent: I have reviewed the patients History and Physical, chart, labs and discussed the procedure including the risks, benefits and alternatives for the proposed anesthesia with the patient or authorized representative who has indicated his/her understanding and acceptance.     Dental advisory given  Plan Discussed with: CRNA  Anesthesia Plan Comments: (PAT note from 1/20 by Sherlie Ban PA-C )        Anesthesia Quick Evaluation

## 2023-12-26 NOTE — Progress Notes (Signed)
Case: [id number] Date/Time: 01/01/24 0815   Procedure: CT CRYOABLATIONWITH ANESTHESIA   Anesthesia type: General   Pre-op diagnosis: LEFT RENAL MASS   [facility]   Surgeons: Gilmer Mor, DO       DISCUSSION: Cameron Harper is a 48 yo male who presents to PAT prior to surgery above. PMH of former smoking, stage 4 lung cancer s/p XRT and chemo  Patient follows with Oncology for stage 4 lung cancer and is s/p chemo. Last seen on 10/24/23. CT C/A/P on 09/26/23 showed metastatic disease to the left kidney and he was referred for treatment.  Patient had a w/u for chest pain with Cardiology prior to his lung cancer diagnosis in 2022 including echo and stress test which came back normal.  VS:     12/25/2023    9:54 AM 11/25/2023   10:01 AM 10/24/2023   10:52 AM  Vitals with BMI  Height 5\' 10"   5\' 9"   Weight 180 lbs  183 lbs 10 oz  BMI 25.83  27.1  Systolic 120 114 811  Diastolic 76 64 69  Pulse 63 74 71     PROVIDERS: [name], MD   LABS: Labs reviewed: Acceptable for surgery. (all labs ordered are listed, but only abnormal results are displayed)  Labs Reviewed - No data to display   IMAGES: CT Chest/Abd/Pelvis 09/26/23:  IMPRESSION: 1. Stable left perihilar postradiation change. No evidence of local recurrence. 2. Stable small left pleural effusion. 3. No evidence of metastatic disease in the chest, abdomen or pelvis. 4. Possible enhancing lesion of the mid region of the left kidney measuring 1.4 cm, concerning for small RCC. Recommend renal protocol CT or MRI for further evaluation. 5. Additional left upper pole renal lesion is decreased in size and demonstrates increased attenuation, likely due to interval hemorrhage. Recommend attention on follow-up. 6. Aortic Atherosclerosis (ICD10-I70.0) and Emphysema (ICD10-J43.9).   EKG 12/25/23 Normal sinus rhythm, rate 71 Cannot rule out Anterior infarct , age undetermined  CV:  Stress test  03/15/2021:  Blood pressure demonstrated a normal response to exercise. There was no ST segment deviation noted during stress.   Normal ETT No significant arrhythmias Normal hemodynamic response    Echo 03/13/2021:  IMPRESSIONS    1. Left ventricular ejection fraction, by estimation, is 60 to 65%. The left ventricle has normal function. The left ventricle has no regional wall motion abnormalities. Left ventricular diastolic parameters were normal. The average left ventricular global longitudinal strain is 20.1 %. The global longitudinal strain is normal.  2. Right ventricular systolic function is normal. The right ventricular size is normal.  3. The mitral valve is normal in structure. No evidence of mitral valve regurgitation. No evidence of mitral stenosis.  4. The aortic valve is tricuspid. Aortic valve regurgitation is not visualized. No aortic stenosis is present.  5. The inferior vena cava is normal in size with greater than 50% respiratory variability, suggesting right atrial pressure of 3 mmHg.  Past Medical History:  Diagnosis Date   Chronic back pain    Eczema    Lung cancer [facility])     Past Surgical History:  Procedure Laterality Date   BRONCHIAL BRUSHINGS  05/12/2021   Procedure: BRONCHIAL BRUSHINGS;  Surgeon: Josephine Igo, DO;  [facility];  Service: Pulmonary;;   BRONCHIAL DILITATION  05/12/2021   Procedure: BRONCHIAL DILITATION;  Surgeon: Josephine Igo, DO;  [facility];  Service: Pulmonary;;   BRONCHIAL NEEDLE ASPIRATION BIOPSY  05/12/2021  Procedure: BRONCHIAL NEEDLE ASPIRATION BIOPSIES;  Surgeon: Josephine Igo, DO;  [facility];  Service: Pulmonary;;   BRONCHIAL WASHINGS  05/12/2021   Procedure: BRONCHIAL WASHINGS;  Surgeon: Josephine Igo, DO;  [facility];  Service: Pulmonary;;   CRYOTHERAPY  05/12/2021   Procedure: CRYOTHERAPY;  Surgeon: Josephine Igo, DO;  [facility];  Service:  Pulmonary;;   HEMOSTASIS CONTROL  05/12/2021   Procedure: HEMOSTASIS CONTROL;  Surgeon: Josephine Igo, DO;  [facility];  Service: Pulmonary;;  epi injection   IR IMAGING GUIDED PORT INSERTION  06/29/2021   IR RADIOLOGIST EVAL & MGMT  11/25/2023   PORTACATH PLACEMENT     Spine injection     Pain control   VIDEO BRONCHOSCOPY WITH ENDOBRONCHIAL ULTRASOUND N/A 05/12/2021   Procedure: VIDEO BRONCHOSCOPY WITH ENDOBRONCHIAL ULTRASOUND;  Surgeon: Josephine Igo, DO;  [facility];  Service: Pulmonary;  Laterality: N/A;   WISDOM TOOTH EXTRACTION     no anesthesia involed    MEDICATIONS:  acetaminophen (TYLENOL) 500 MG tablet   lidocaine-prilocaine (EMLA) cream   prochlorperazine (COMPAZINE) 10 MG tablet   triamcinolone cream (KENALOG) 0.1 %   No current facility-administered medications for this encounter.   [name] [facility] Short Stay/Anesthesiology [facility] Phone [phone] 12/26/2023 9:30 AM

## 2023-12-31 ENCOUNTER — Other Ambulatory Visit: Payer: Self-pay | Admitting: Radiology

## 2023-12-31 DIAGNOSIS — N2889 Other specified disorders of kidney and ureter: Secondary | ICD-10-CM

## 2023-12-31 NOTE — H&P (Deleted)
The note originally documented on this encounter has been moved the the encounter in which it belongs.

## 2023-12-31 NOTE — H&P (Addendum)
Chief Complaint: Patient was seen in consultation today for a left renal mass   Procedure: CT Guided Tissue Cryoablation and possible Renal biopsy with General Anesthesia  Referring Physician(s): Dr. Jannifer Hick / Dr. Ardelle Anton  Supervising Physician: Gilmer Mor  Patient Status: [facility] - Out-pt  History of Present Illness: Cameron Harper is a 48 y.o. male with a history of tobacco use, non-small cell lung cancer, adenocarcinoma, and chronic back pain presenting due to a left renal mass. Patient was referred by Dr. Cardell Peach after CT A/P with contrast on 09/26/23 revealed 'Possible enhancing lesion of the mid region of the left kidney measuring 1.4 cm, concerning for small RCC. Recommend renal protocol CT or MRI for further evaluation.' MR Abdomen on 10/14/23 further confirmed 'Contrast enhancing mass within the cortex of the lateral midportion of the left kidney measuring 1.8 x 1.7 cm, consistent with renal cell carcinoma.' Referred to IR for renal biopsy with tissue cryoablation.    Patient is currently resting in bed with his wife at the bedside. Currently has no complaints. He denies any changes in urinary frequency, urgency, dysuria, or hematuria. Recent labs are all WNL. Afebrile. Not on any anticoagulants. NPO since midnight.   Code Status: Full code per pt  Past Medical History:  Diagnosis Date   Chronic back pain    Eczema    Lung cancer [facility])     Past Surgical History:  Procedure Laterality Date   BRONCHIAL BRUSHINGS  05/12/2021   Procedure: BRONCHIAL BRUSHINGS;  Surgeon: Josephine Igo, DO;  [facility]: [facility];  Service: Pulmonary;;   BRONCHIAL DILITATION  05/12/2021   Procedure: BRONCHIAL DILITATION;  Surgeon: Josephine Igo, DO;  Location: [facility];  Service: Pulmonary;;   BRONCHIAL NEEDLE ASPIRATION BIOPSY  05/12/2021   Procedure: BRONCHIAL NEEDLE ASPIRATION BIOPSIES;  Surgeon: Josephine Igo, DO;  Location: [facility];  Service: Pulmonary;;    BRONCHIAL WASHINGS  05/12/2021   Procedure: BRONCHIAL WASHINGS;  Surgeon: Josephine Igo, DO;  [facility]: [facility];  Service: Pulmonary;;   CRYOTHERAPY  05/12/2021   Procedure: CRYOTHERAPY;  Surgeon: Josephine Igo, DO;  [facility]: [facility];  Service: Pulmonary;;   HEMOSTASIS CONTROL  05/12/2021   Procedure: HEMOSTASIS CONTROL;  Surgeon: Josephine Igo, DO;  [facility]: [facility];  Service: Pulmonary;;  epi injection   IR IMAGING GUIDED PORT INSERTION  06/29/2021   IR RADIOLOGIST EVAL & MGMT  11/25/2023   PORTACATH PLACEMENT     Spine injection     Pain control   VIDEO BRONCHOSCOPY WITH ENDOBRONCHIAL ULTRASOUND N/A 05/12/2021   Procedure: VIDEO BRONCHOSCOPY WITH ENDOBRONCHIAL ULTRASOUND;  Surgeon: Josephine Igo, DO;  [facility]: [facility];  Service: Pulmonary;  Laterality: N/A;   WISDOM TOOTH EXTRACTION     no anesthesia involed    Allergies: Penicillins and Zofran [ondansetron]  Medications: Prior to Admission medications   Medication Sig Start Date End Date Taking? Authorizing Provider  acetaminophen (TYLENOL) 500 MG tablet Take 500-1,000 mg by mouth every 6 (six) hours as needed (for back pain.).    [provider]  lidocaine-prilocaine (EMLA) cream Apply 1 application topically as needed. Patient not taking: Reported on 05/30/2023 06/30/21   Heilingoetter, Cassandra L, PA-C  prochlorperazine (COMPAZINE) 10 MG tablet Take 1 tablet (10 mg total) by mouth every 6 (six) hours as needed for nausea or vomiting. Patient not taking: Reported on 05/30/2023 02/05/22   Si Gaul, MD  triamcinolone cream (KENALOG) 0.1 % Apply 1 Application topically 2 (two)  times daily as needed. 04/18/23   Si Gaul, MD     Family History  Problem Relation Age of Onset   Heart attack Maternal Grandmother    CAD Maternal Grandmother    Diabetes Maternal Grandfather     Social History   Socioeconomic History   Marital status: Married    Spouse name: Not on file    Number of children: Not on file   Years of education: Not on file   Highest education level: Not on file  Occupational History   Not on file  Tobacco Use   Smoking status: Former    Current packs/day: 0.00    Average packs/day: 1 pack/day for 25.0 years (25.0 ttl pk-yrs)    Types: Cigarettes    Start date: 04/18/1996    Quit date: 04/18/2021    Years since quitting: 2.7   Smokeless tobacco: Never   Tobacco comments:    currently 7 cigs per day/ 04/18/21//lmr  Vaping Use   Vaping status: Former  Substance and Sexual Activity   Alcohol use: Not Currently   Drug use: No   Sexual activity: Not on file  Other Topics Concern   Not on file  Social History Narrative   Not on file   Social Drivers of Health   Financial Resource Strain: Low Risk  (05/19/2021)   Overall Financial Resource Strain (CARDIA)    Difficulty of Paying Living Expenses: Not very hard  Food Insecurity: No Food Insecurity (05/19/2021)   Hunger Vital Sign    Worried About Running Out of Food in the Last Year: Never true    Ran Out of Food in the Last Year: Never true  Transportation Needs: No Transportation Needs (05/19/2021)   PRAPARE - Administrator, Civil Service (Medical): No    Lack of Transportation (Non-Medical): No  Physical Activity: Not on file  Stress: Not on file  Social Connections: Socially Integrated (05/19/2021)   Social Connection and Isolation Panel [NHANES]    Frequency of Communication with Friends and Family: More than three times a week    Frequency of Social Gatherings with Friends and Family: More than three times a week    Attends Religious Services: More than 4 times per year    Active Member of [employer] or Organizations: Yes    Attends Engineer, structural: More than 4 times per year    Marital Status: Married    Review of Systems  Constitutional:  Negative for chills and fever.  Respiratory:  Negative for shortness of breath.   Cardiovascular:  Negative for chest  pain.  Gastrointestinal:  Negative for nausea and vomiting.  Genitourinary:  Negative for decreased urine volume, dysuria, frequency and urgency.  Musculoskeletal:  Negative for myalgias.  Neurological:  Negative for dizziness.  All other ROS negative.  Vital Signs:    01/01/2024    6:41 AM 01/01/2024    6:20 AM    Vitals with BMI  Height  5\' 10"    Weight  180 lbs   BMI  25.83   Systolic 90    Diastolic 61    Pulse 51      Physical Exam Vitals reviewed.  Constitutional:      Appearance: Normal appearance.  HENT:     Head: Normocephalic and atraumatic.     Mouth/Throat:     Mouth: Mucous membranes are moist.     Pharynx: Oropharynx is clear.  Pulmonary:     Effort: Pulmonary effort is normal.  Musculoskeletal:        General: Normal range of motion.     Cervical back: Normal range of motion.  Skin:    General: Skin is warm.  Neurological:     General: No focal deficit present.     Mental Status: He is alert and oriented to person, place, and time. Mental status is at baseline.  Psychiatric:        Mood and Affect: Mood normal.        Behavior: Behavior normal.        Judgment: Judgment normal.     Imaging: DG Chest 1 View Result Date: 12/28/2023 CLINICAL DATA:  Preprocedure exam. EXAM: CHEST  1 VIEW COMPARISON:  Chest radiograph 09/14/2021. CT chest 09/26/2023 FINDINGS: Port-A-Cath tip projects over the superior vena cava. Stable cardiac and mediastinal contours. Redemonstrated moderate left pleural effusion with underlying consolidation. No pneumothorax. IMPRESSION: Redemonstrated moderate left pleural effusion with underlying consolidation. Electronically Signed   By: Annia Belt M.D.   On: 12/28/2023 11:24    Labs:  CBC: Recent Labs    05/30/23 0749 06/26/23 1026 09/26/23 0850 12/25/23 0944  WBC 6.7 6.0 6.1 6.0  HGB 13.1 13.3 13.5 15.1  HCT 40.1 40.6 41.6 48.3  PLT 259 243 233 246    COAGS: Recent Labs    12/25/23 0944  INR 1.0     BMP: Recent Labs    05/30/23 0749 06/26/23 1026 09/26/23 0850 12/25/23 0944  NA 140 140 140 139  K 4.1 4.2 4.1 4.4  CL 107 108 105 105  CO2 27 29 30 26   GLUCOSE 91 68* 86 87  BUN 19 16 14 17   CALCIUM 9.2 9.4 9.3 9.3  CREATININE 1.32* 1.22 1.36* 1.16  GFRNONAA >60 >60 >60 >60    LIVER FUNCTION TESTS: Recent Labs    05/10/23 0926 05/30/23 0749 06/26/23 1026 09/26/23 0850  BILITOT 0.3 0.3 0.3 0.5  AST 14* 12* 14* 15  ALT 15 13 15 16   ALKPHOS 107 110 116 110  PROT 7.2 7.3 7.0 7.3  ALBUMIN 3.7 3.6 3.9 4.0    TUMOR MARKERS: No results for input(s): "AFPTM", "CEA", "CA199", "CHROMGRNA" in the last 8760 hours.  Assessment and Plan:  48 y.o. male with a history of tobacco use, non-small cell lung cancer, adenocarcinoma, and chronic back pain presenting due to a left renal mass. Patient was referred by Dr. Cardell Peach after CT A/P with contrast on 09/26/23 revealed 'Possible enhancing lesion of the mid region of the left kidney measuring 1.4 cm, concerning for small RCC. Recommend renal protocol CT or MRI for further evaluation.' MR Abdomen on 10/14/23 further confirmed 'Contrast enhancing mass within the cortex of the lateral midportion of the left kidney measuring 1.8 x 1.7 cm, consistent with renal cell carcinoma.' Referred to IR for renal biopsy with tissue ablation.    Plan for tissue cryoablation with possible renal biopsy on 01/01/24 with Dr. Mosie Epstein  Risks and benefits of renal biopsy and ablation was discussed with the patient and/or patient's family including, but not limited to bleeding, infection, damage to adjacent structures or low yield requiring additional tests.  All of the questions were answered and there is agreement to proceed.  Consent signed and in chart.  Thank you for this interesting consult. I greatly enjoyed meeting EKAM BESSON and look forward to participating in their care. A copy of this report was sent to the requesting provider on this  date.  Electronically Signed: Jama Flavors,  PA-C 01/01/2024, 8:07 AM   I spent a total of  30 Minutes  in face to face clinical consultation, greater than 50% of which was counseling/coordinating care for tissue cryoablation and renal biopsy with general anesthesia.

## 2024-01-01 ENCOUNTER — Encounter (HOSPITAL_COMMUNITY)
Admission: RE | Disposition: A | Discharge status: Home / Self Care | Payer: Self-pay | Source: Ambulatory Visit | Attending: Interventional Radiology

## 2024-01-01 ENCOUNTER — Ambulatory Visit (HOSPITAL_COMMUNITY)
Admission: RE | Admit: 2024-01-01 | Discharge: 2024-01-01 | Disposition: A | Discharge status: Home / Self Care | Payer: HMO | Source: Ambulatory Visit | Attending: Interventional Radiology | Admitting: Interventional Radiology

## 2024-01-01 ENCOUNTER — Ambulatory Visit (HOSPITAL_COMMUNITY): Payer: HMO | Admitting: Medical

## 2024-01-01 ENCOUNTER — Other Ambulatory Visit: Payer: Self-pay

## 2024-01-01 ENCOUNTER — Encounter (HOSPITAL_COMMUNITY): Payer: Self-pay | Admitting: Interventional Radiology

## 2024-01-01 ENCOUNTER — Encounter (HOSPITAL_COMMUNITY): Payer: Self-pay

## 2024-01-01 ENCOUNTER — Ambulatory Visit (HOSPITAL_BASED_OUTPATIENT_CLINIC_OR_DEPARTMENT_OTHER): Payer: HMO | Admitting: Certified Registered Nurse Anesthetist

## 2024-01-01 DIAGNOSIS — C649 Malignant neoplasm of unspecified kidney, except renal pelvis: Secondary | ICD-10-CM | POA: Insufficient documentation

## 2024-01-01 DIAGNOSIS — M549 Dorsalgia, unspecified: Secondary | ICD-10-CM | POA: Insufficient documentation

## 2024-01-01 DIAGNOSIS — Z85118 Personal history of other malignant neoplasm of bronchus and lung: Secondary | ICD-10-CM | POA: Insufficient documentation

## 2024-01-01 DIAGNOSIS — G8929 Other chronic pain: Secondary | ICD-10-CM | POA: Insufficient documentation

## 2024-01-01 DIAGNOSIS — N2889 Other specified disorders of kidney and ureter: Secondary | ICD-10-CM

## 2024-01-01 DIAGNOSIS — J90 Pleural effusion, not elsewhere classified: Secondary | ICD-10-CM | POA: Diagnosis not present

## 2024-01-01 DIAGNOSIS — Z87891 Personal history of nicotine dependence: Secondary | ICD-10-CM | POA: Insufficient documentation

## 2024-01-01 DIAGNOSIS — C642 Malignant neoplasm of left kidney, except renal pelvis: Secondary | ICD-10-CM | POA: Insufficient documentation

## 2024-01-01 DIAGNOSIS — D49512 Neoplasm of unspecified behavior of left kidney: Secondary | ICD-10-CM | POA: Diagnosis not present

## 2024-01-01 HISTORY — PX: RADIOLOGY WITH ANESTHESIA: SHX6223

## 2024-01-01 LAB — TYPE AND SCREEN
ABO/RH(D): A NEG
Antibody Screen: NEGATIVE

## 2024-01-01 SURGERY — CT WITH ANESTHESIA
Anesthesia: General

## 2024-01-01 MED ORDER — OXYCODONE HCL 5 MG PO TABS: 5.0000 mg | ORAL_TABLET | Freq: Once | ORAL | Status: DC | PRN

## 2024-01-01 MED ORDER — SUGAMMADEX SODIUM 200 MG/2ML IV SOLN
INTRAVENOUS | Status: DC | PRN
  Administered 2024-01-01: 200 mg via INTRAVENOUS

## 2024-01-01 MED ORDER — LACTATED RINGERS IV SOLN: INTRAVENOUS | Status: DC

## 2024-01-01 MED ORDER — ONDANSETRON HCL 4 MG/2ML IJ SOLN
INTRAMUSCULAR | Status: DC | PRN
  Administered 2024-01-01: 4 mg via INTRAVENOUS

## 2024-01-01 MED ORDER — DOCUSATE SODIUM 100 MG PO CAPS
100.0000 mg | ORAL_CAPSULE | Freq: Two times a day (BID) | ORAL | Status: DC
Start: 2024-01-01 — End: 2024-01-01
  Filled 2024-01-01: qty 1

## 2024-01-01 MED ORDER — DROPERIDOL 2.5 MG/ML IJ SOLN
0.6250 mg | Freq: Once | INTRAMUSCULAR | Status: AC | PRN
  Administered 2024-01-01: 0.625 mg via INTRAVENOUS

## 2024-01-01 MED ORDER — AMISULPRIDE (ANTIEMETIC) 5 MG/2ML IV SOLN
INTRAVENOUS | Status: AC
  Administered 2024-01-01: 10 mg via INTRAVENOUS
  Filled 2024-01-01: qty 4

## 2024-01-01 MED ORDER — FENTANYL CITRATE (PF) 100 MCG/2ML IJ SOLN
INTRAMUSCULAR | Status: DC | PRN
  Administered 2024-01-01: 100 ug via INTRAVENOUS

## 2024-01-01 MED ORDER — PHENYLEPHRINE HCL-NACL 20-0.9 MG/250ML-% IV SOLN
INTRAVENOUS | Status: AC
  Filled 2024-01-01: qty 250

## 2024-01-01 MED ORDER — CHLORHEXIDINE GLUCONATE CLOTH 2 % EX PADS: 6.0000 | MEDICATED_PAD | Freq: Every day | CUTANEOUS | Status: DC

## 2024-01-01 MED ORDER — MIDAZOLAM HCL 5 MG/5ML IJ SOLN
INTRAMUSCULAR | Status: DC | PRN
  Administered 2024-01-01: 2 mg via INTRAVENOUS

## 2024-01-01 MED ORDER — MIDAZOLAM HCL 2 MG/2ML IJ SOLN
INTRAMUSCULAR | Status: AC
  Filled 2024-01-01: qty 2

## 2024-01-01 MED ORDER — IOHEXOL 300 MG/ML  SOLN
80.0000 mL | Freq: Once | INTRAMUSCULAR | Status: AC | PRN
  Administered 2024-01-01: 80 mL via INTRAVENOUS

## 2024-01-01 MED ORDER — LIDOCAINE 2% (20 MG/ML) 5 ML SYRINGE
INTRAMUSCULAR | Status: DC | PRN
  Administered 2024-01-01: 60 mg via INTRAVENOUS

## 2024-01-01 MED ORDER — DEXAMETHASONE SODIUM PHOSPHATE 10 MG/ML IJ SOLN
INTRAMUSCULAR | Status: DC | PRN
  Administered 2024-01-01: 10 mg via INTRAVENOUS

## 2024-01-01 MED ORDER — FENTANYL CITRATE (PF) 250 MCG/5ML IJ SOLN
INTRAMUSCULAR | Status: AC
  Filled 2024-01-01: qty 5

## 2024-01-01 MED ORDER — ONDANSETRON HCL 4 MG/2ML IJ SOLN
INTRAMUSCULAR | Status: AC
  Filled 2024-01-01: qty 2

## 2024-01-01 MED ORDER — FENTANYL CITRATE PF 50 MCG/ML IJ SOSY
PREFILLED_SYRINGE | INTRAMUSCULAR | Status: AC
  Administered 2024-01-01: 25 ug via INTRAVENOUS
  Filled 2024-01-01: qty 1

## 2024-01-01 MED ORDER — SODIUM CHLORIDE 0.9 % IV SOLN: INTRAVENOUS | Status: DC

## 2024-01-01 MED ORDER — LEVOFLOXACIN IN D5W 500 MG/100ML IV SOLN
500.0000 mg | Freq: Once | INTRAVENOUS | Status: AC
  Administered 2024-01-01: 500 mg via INTRAVENOUS
  Filled 2024-01-01: qty 100

## 2024-01-01 MED ORDER — PHENYLEPHRINE 80 MCG/ML (10ML) SYRINGE FOR IV PUSH (FOR BLOOD PRESSURE SUPPORT)
PREFILLED_SYRINGE | INTRAVENOUS | Status: DC | PRN
  Administered 2024-01-01 (×5): 160 ug via INTRAVENOUS

## 2024-01-01 MED ORDER — ROCURONIUM BROMIDE 10 MG/ML (PF) SYRINGE
PREFILLED_SYRINGE | INTRAVENOUS | Status: DC | PRN
  Administered 2024-01-01: 60 mg via INTRAVENOUS

## 2024-01-01 MED ORDER — PROPOFOL 10 MG/ML IV BOLUS
INTRAVENOUS | Status: DC | PRN
  Administered 2024-01-01: 150 mg via INTRAVENOUS

## 2024-01-01 MED ORDER — PHENYLEPHRINE HCL-NACL 20-0.9 MG/250ML-% IV SOLN
INTRAVENOUS | Status: DC | PRN
  Administered 2024-01-01: 40 ug/min via INTRAVENOUS

## 2024-01-01 MED ORDER — SODIUM CHLORIDE 0.9 % IV SOLN
INTRAVENOUS | Status: AC
  Filled 2024-01-01: qty 250

## 2024-01-01 MED ORDER — ACETAMINOPHEN 10 MG/ML IV SOLN: 1000.0000 mg | Freq: Once | INTRAVENOUS | Status: DC | PRN

## 2024-01-01 MED ORDER — SODIUM CHLORIDE 0.9 % IV SOLN
12.5000 mg | Freq: Once | INTRAVENOUS | Status: DC | PRN
  Filled 2024-01-01: qty 0.5

## 2024-01-01 MED ORDER — FENTANYL CITRATE PF 50 MCG/ML IJ SOSY
25.0000 ug | PREFILLED_SYRINGE | INTRAMUSCULAR | Status: DC | PRN
  Administered 2024-01-01: 25 ug via INTRAVENOUS

## 2024-01-01 MED ORDER — LACTATED RINGERS IV BOLUS: 500.0000 mL | Freq: Once | INTRAVENOUS | Status: DC

## 2024-01-01 MED ORDER — ACETAMINOPHEN 10 MG/ML IV SOLN
INTRAVENOUS | Status: AC
  Administered 2024-01-01: 1000 mg via INTRAVENOUS
  Filled 2024-01-01: qty 100

## 2024-01-01 MED ORDER — LACTATED RINGERS IV BOLUS
500.0000 mL | Freq: Once | INTRAVENOUS | Status: AC
  Administered 2024-01-01: 500 mL via INTRAVENOUS

## 2024-01-01 MED ORDER — DROPERIDOL 2.5 MG/ML IJ SOLN
INTRAMUSCULAR | Status: AC
  Filled 2024-01-01: qty 2

## 2024-01-01 MED ORDER — OXYCODONE HCL 5 MG/5ML PO SOLN: 5.0000 mg | Freq: Once | ORAL | Status: DC | PRN

## 2024-01-01 MED ORDER — HYDROCODONE-ACETAMINOPHEN 5-325 MG PO TABS
1.0000 | ORAL_TABLET | ORAL | Status: DC | PRN
Start: 2024-01-01 — End: 2024-01-01

## 2024-01-01 MED ORDER — AMISULPRIDE (ANTIEMETIC) 5 MG/2ML IV SOLN: 10.0000 mg | Freq: Once | INTRAVENOUS | Status: AC | PRN

## 2024-01-01 NOTE — Anesthesia Procedure Notes (Signed)
Procedure Name: Intubation Date/Time: 01/01/2024 9:04 AM  Performed by: Kizzie Fantasia, CRNAPre-anesthesia Checklist: Patient identified, Emergency Drugs available, Suction available, Patient being monitored and Timeout performed Patient Re-evaluated:Patient Re-evaluated prior to induction Oxygen Delivery Method: Circle system utilized Preoxygenation: Pre-oxygenation with 100% oxygen Induction Type: IV induction Ventilation: Mask ventilation without difficulty Laryngoscope Size: Mac and 4 Grade View: Grade I Tube type: Oral Tube size: 7.5 mm Number of attempts: 1 Airway Equipment and Method: Stylet Placement Confirmation: ETT inserted through vocal cords under direct vision, positive ETCO2 and breath sounds checked- equal and bilateral Secured at: 23 cm Tube secured with: Tape Dental Injury: Teeth and Oropharynx as per pre-operative assessment

## 2024-01-01 NOTE — Sedation Documentation (Signed)
Anesthesia in to sedate and monitor.

## 2024-01-01 NOTE — Transfer of Care (Signed)
Immediate Anesthesia Transfer of Care Note  Patient: Cameron Harper  Procedure(s) Performed: CT CRYOABLATIONWITH ANESTHESIA  Patient Location: PACU  Anesthesia Type:General  Level of Consciousness: awake, alert , and oriented  Airway & Oxygen Therapy: Patient Spontanous Breathing and Patient connected to face mask oxygen  Post-op Assessment: Report given to RN and Post -op Vital signs reviewed and stable  Post vital signs: Reviewed and stable  Last Vitals:  Vitals Value Taken Time  BP 162/86 01/01/24 1112  Temp    Pulse 70 01/01/24 1113  Resp 19 01/01/24 1113  SpO2 100 % 01/01/24 1113  Vitals shown include unfiled device data.  Last Pain:  Vitals:   01/01/24 0641  TempSrc: Oral  PainSc:          Complications: No notable events documented.

## 2024-01-01 NOTE — Procedures (Signed)
Interventional Radiology Procedure Note  Procedure:   CT guided tissue ablation (cryo technology) of left renal mass, with biopsy.   Anesthesia: GETA EBL: none Complications: None  Recommendations:  -Stable to PACU - anticipate same day discharge after recovery - hematuria (pink urine) not unexpected - ice and OTC's for any muscle soreness - Do not submerge for 7 days - Routine wound care - can shower tomorrow  - follow up with Cameron Harper in ~4 weeks.  First imaging ~3-4 months    Signed,  Yvone Neu. Loreta Ave, DO, ABVM, RPVI

## 2024-01-01 NOTE — Progress Notes (Signed)
Patient ID: Cameron Harper, male   DOB: 1976/06/24, 48 y.o.   MRN: [id number] Pt seen in f/u today in PACU following left renal mass bx/cryoablation earlier this am; VSS; afebrile; pt denied pain, fever, chills, dyspnea, cough, abd/back pain or vomiting ; he did have mild nausea which was relieved with antiemetic, IV fluid bolus. He was able to ambulate , void yellow urine, tolerate few crackers. Pt was seen by Dr. Loreta Ave. Puncture sites left flank clean and dry, NT, no hematomas. Abd NT. Pt was deemed stable for dc home today. IR f/u will be scheduled in 4 weeks. Discharge instructions administered by PACU nursing.

## 2024-01-02 ENCOUNTER — Encounter (HOSPITAL_COMMUNITY): Payer: Self-pay | Admitting: Interventional Radiology

## 2024-01-02 LAB — SURGICAL PATHOLOGY

## 2024-01-02 NOTE — Anesthesia Postprocedure Evaluation (Signed)
Anesthesia Post Note  Patient: Cameron Harper  Procedure(s) Performed: CT CRYOABLATIONWITH ANESTHESIA     Patient location during evaluation: PACU Anesthesia Type: General Level of consciousness: awake Pain management: pain level controlled Vital Signs Assessment: post-procedure vital signs reviewed and stable Respiratory status: spontaneous breathing, nonlabored ventilation and respiratory function stable Cardiovascular status: blood pressure returned to baseline and stable Postop Assessment: no apparent nausea or vomiting Anesthetic complications: no   No notable events documented.  Last Vitals:  Vitals:   01/01/24 1415 01/01/24 1515  BP: 118/72 116/71  Pulse: 66 76  Resp: 18 20  Temp: 36.5 C 36.6 C  SpO2: 98% 99%    Last Pain:  Vitals:   01/01/24 1515  TempSrc:   PainSc: 0-No pain                 Nakiyah Beverley P Bellamarie Pflug

## 2024-01-06 ENCOUNTER — Telehealth: Payer: Self-pay | Admitting: *Deleted

## 2024-01-06 NOTE — Telephone Encounter (Signed)
Patient called with concerns about recent reoccurring headaches. They have been coming more frequently centrally located in his forehead. He does not typically get headaches.  He is concerned he has not had a scan on his head since his diagnosis. He is asking if it would be possible to get this done soon again. He has a CT Scan scheduled for 2/10.

## 2024-01-07 ENCOUNTER — Other Ambulatory Visit: Payer: Self-pay | Admitting: *Deleted

## 2024-01-07 ENCOUNTER — Encounter: Payer: Self-pay | Admitting: Internal Medicine

## 2024-01-07 ENCOUNTER — Encounter: Payer: Self-pay | Admitting: Physician Assistant

## 2024-01-07 DIAGNOSIS — C3490 Malignant neoplasm of unspecified part of unspecified bronchus or lung: Secondary | ICD-10-CM

## 2024-01-07 NOTE — Telephone Encounter (Signed)
Scan ordered and added to appointment on 01/13/24

## 2024-01-08 ENCOUNTER — Telehealth: Payer: Self-pay | Admitting: *Deleted

## 2024-01-08 ENCOUNTER — Other Ambulatory Visit: Payer: Self-pay

## 2024-01-08 NOTE — Telephone Encounter (Signed)
 Mr. Boudoin called with question r/t CTs scheduled for Monday 2/10. He had renal biopsy w/ablation on 01/01/24 and wanted to alert radiology of this since he is receiving dye for the scheduled CTs and understands it is processed by his kidneys. He asked if would check with CT to make sure the CTs could proceed as scheduled.  Contacted CT - spoke with Pam. She reviewed question with radiology PA. She was advised his creatinine from labs drawn Monday AM will be reviewed prior to CT to make sure they are within parameters. Pam asked that patient be advised to inform CT staff about renal bx/ablation on his arrival Monday AM.   Contacted patient with this information - left this information on named voice mail and note placed in appointment.

## 2024-01-13 ENCOUNTER — Ambulatory Visit (HOSPITAL_COMMUNITY)
Admission: RE | Admit: 2024-01-13 | Discharge: 2024-01-13 | Disposition: A | Discharge status: Home / Self Care | Payer: HMO | Source: Ambulatory Visit | Attending: Internal Medicine | Admitting: Internal Medicine

## 2024-01-13 ENCOUNTER — Ambulatory Visit
Admission: EM | Admit: 2024-01-13 | Discharge: 2024-01-13 | Disposition: A | Discharge status: Home / Self Care | Payer: HMO | Attending: Nurse Practitioner | Admitting: Nurse Practitioner

## 2024-01-13 ENCOUNTER — Inpatient Hospital Stay: Payer: HMO | Attending: Physician Assistant

## 2024-01-13 ENCOUNTER — Other Ambulatory Visit: Payer: Self-pay | Admitting: Internal Medicine

## 2024-01-13 DIAGNOSIS — C3490 Malignant neoplasm of unspecified part of unspecified bronchus or lung: Secondary | ICD-10-CM | POA: Diagnosis not present

## 2024-01-13 DIAGNOSIS — N289 Disorder of kidney and ureter, unspecified: Secondary | ICD-10-CM | POA: Diagnosis not present

## 2024-01-13 DIAGNOSIS — C3432 Malignant neoplasm of lower lobe, left bronchus or lung: Secondary | ICD-10-CM | POA: Diagnosis not present

## 2024-01-13 DIAGNOSIS — Z95828 Presence of other vascular implants and grafts: Secondary | ICD-10-CM

## 2024-01-13 DIAGNOSIS — Z79899 Other long term (current) drug therapy: Secondary | ICD-10-CM | POA: Diagnosis not present

## 2024-01-13 DIAGNOSIS — Z87891 Personal history of nicotine dependence: Secondary | ICD-10-CM | POA: Insufficient documentation

## 2024-01-13 DIAGNOSIS — R519 Headache, unspecified: Secondary | ICD-10-CM | POA: Diagnosis not present

## 2024-01-13 DIAGNOSIS — J101 Influenza due to other identified influenza virus with other respiratory manifestations: Secondary | ICD-10-CM | POA: Diagnosis not present

## 2024-01-13 DIAGNOSIS — J90 Pleural effusion, not elsewhere classified: Secondary | ICD-10-CM | POA: Diagnosis not present

## 2024-01-13 DIAGNOSIS — J8410 Pulmonary fibrosis, unspecified: Secondary | ICD-10-CM | POA: Diagnosis not present

## 2024-01-13 LAB — CBC WITH DIFFERENTIAL (CANCER CENTER ONLY)
Abs Immature Granulocytes: 0.02 10*3/uL (ref 0.00–0.07)
Basophils Absolute: 0.1 10*3/uL (ref 0.0–0.1)
Basophils Relative: 1 %
Eosinophils Absolute: 0.2 10*3/uL (ref 0.0–0.5)
Eosinophils Relative: 2 %
HCT: 43.4 % (ref 39.0–52.0)
Hemoglobin: 14 g/dL (ref 13.0–17.0)
Immature Granulocytes: 0 %
Lymphocytes Relative: 5 %
Lymphs Abs: 0.5 10*3/uL — ABNORMAL LOW (ref 0.7–4.0)
MCH: 29 pg (ref 26.0–34.0)
MCHC: 32.3 g/dL (ref 30.0–36.0)
MCV: 89.9 fL (ref 80.0–100.0)
Monocytes Absolute: 0.7 10*3/uL (ref 0.1–1.0)
Monocytes Relative: 8 %
Neutro Abs: 7.4 10*3/uL (ref 1.7–7.7)
Neutrophils Relative %: 84 %
Platelet Count: 224 10*3/uL (ref 150–400)
RBC: 4.83 MIL/uL (ref 4.22–5.81)
RDW: 13.8 % (ref 11.5–15.5)
WBC Count: 8.8 10*3/uL (ref 4.0–10.5)
nRBC: 0 % (ref 0.0–0.2)

## 2024-01-13 LAB — CMP (CANCER CENTER ONLY)
ALT: 15 U/L (ref 0–44)
AST: 12 U/L — ABNORMAL LOW (ref 15–41)
Albumin: 3.9 g/dL (ref 3.5–5.0)
Alkaline Phosphatase: 103 U/L (ref 38–126)
Anion gap: 6 (ref 5–15)
BUN: 15 mg/dL (ref 6–20)
CO2: 27 mmol/L (ref 22–32)
Calcium: 9 mg/dL (ref 8.9–10.3)
Chloride: 105 mmol/L (ref 98–111)
Creatinine: 1.33 mg/dL — ABNORMAL HIGH (ref 0.61–1.24)
GFR, Estimated: >60 mL/min (ref >60)
Glucose, Bld: 94 mg/dL (ref 70–99)
Potassium: 4.3 mmol/L (ref 3.5–5.1)
Sodium: 138 mmol/L (ref 135–145)
Total Bilirubin: 0.5 mg/dL (ref 0.0–1.2)
Total Protein: 7.1 g/dL (ref 6.5–8.1)

## 2024-01-13 LAB — POCT INFLUENZA A/B
Influenza A, POC: POSITIVE — AB
Influenza B, POC: NEGATIVE

## 2024-01-13 LAB — LACTATE DEHYDROGENASE: LDH: 175 U/L (ref 98–192)

## 2024-01-13 MED ORDER — IOHEXOL 350 MG/ML SOLN
100.0000 mL | Freq: Once | INTRAVENOUS | Status: AC | PRN
  Administered 2024-01-13: 100 mL via INTRAVENOUS

## 2024-01-13 MED ORDER — HEPARIN SOD (PORK) LOCK FLUSH 100 UNIT/ML IV SOLN
INTRAVENOUS | Status: AC
Start: 2024-01-13 — End: ?
  Filled 2024-01-13: qty 5

## 2024-01-13 MED ORDER — HEPARIN SOD (PORK) LOCK FLUSH 100 UNIT/ML IV SOLN
500.0000 [IU] | Freq: Once | INTRAVENOUS | Status: AC
  Administered 2024-01-13: 500 [IU] via INTRAVENOUS

## 2024-01-13 MED ORDER — SODIUM CHLORIDE 0.9% FLUSH
10.0000 mL | Freq: Once | INTRAVENOUS | Status: AC
Start: 2024-01-13 — End: 2024-01-13
  Administered 2024-01-13: 10 mL

## 2024-01-13 MED ORDER — OSELTAMIVIR PHOSPHATE 75 MG PO CAPS: 75.0000 mg | ORAL_CAPSULE | Freq: Two times a day (BID) | ORAL | 0 refills | Status: AC

## 2024-01-13 NOTE — Discharge Instructions (Addendum)
 Your your influenza test is positive for flu A.  You have been prescribed Tamiflu  75 mg 1 tablet every 12 hours for 5 days.  We encourage conservative treatment with symptom relief. You are encouraged to hydrate well and get plenty rest. We encourage you to use Tylenol  alternating with Ibuprofen  for your fever if not contraindicated. (Remember to use as directed do not exceed daily dosing recommendations).  Your cough can be soothed with a cough suppressant.  You should also consider throat lozenges and chloraseptic spray. We also encourage salt water gargles for your sore throat.

## 2024-01-13 NOTE — ED Provider Notes (Signed)
 [facility]    CSN: [id number] Arrival date & time: 01/13/24  1429      History   Chief Complaint Chief Complaint  Patient presents with   cough    HPI Cameron Harper is a 48 y.o. male.   HPI  He is in today for evaluation of headaches, body aches and chest congestion.  He reports that his daughter was recently tested positive for flu.  He has a history of lung cancer and is not sure if his achiness may be related.  He reports he may have had a sinus pressure.  He denies any fever, chills, ear pain, sore throat shortness of breath or chest pain. Past Medical History:  Diagnosis Date   Chronic back pain    Eczema    Lung cancer [facility])     Patient Active Problem List   Diagnosis Date Noted   Renal insufficiency 04/26/2022   Port-A-Cath in place 07/27/2021   Encounter for antineoplastic immunotherapy 06/22/2021   Encounter for antineoplastic chemotherapy 05/18/2021   Lung mass    Malignant neoplasm of lower lobe of left lung (HCC)    Adenopathy 05/04/2021   Mass of left lung 04/18/2021   Chronic cough 04/03/2021   Cigarette smoker 04/03/2021    Past Surgical History:  Procedure Laterality Date   BRONCHIAL BRUSHINGS  05/12/2021   Procedure: BRONCHIAL BRUSHINGS;  Surgeon: Prudy Brownie, DO;  Location: [facility];  Service: Pulmonary;;   BRONCHIAL DILITATION  05/12/2021   Procedure: BRONCHIAL DILITATION;  Surgeon: Prudy Brownie, DO;  [facility]: [facility];  Service: Pulmonary;;   BRONCHIAL NEEDLE ASPIRATION BIOPSY  05/12/2021   Procedure: BRONCHIAL NEEDLE ASPIRATION BIOPSIES;  Surgeon: Prudy Brownie, DO;  [facility]: [facility];  Service: Pulmonary;;   BRONCHIAL WASHINGS  05/12/2021   Procedure: BRONCHIAL WASHINGS;  Surgeon: Prudy Brownie, DO;  [facility];  Service: Pulmonary;;   CRYOTHERAPY  05/12/2021   Procedure: CRYOTHERAPY;  Surgeon: Prudy Brownie, DO;  [facility];  Service: Pulmonary;;   HEMOSTASIS CONTROL   05/12/2021   Procedure: HEMOSTASIS CONTROL;  Surgeon: Prudy Brownie, DO;  [facility];  Service: Pulmonary;;  epi injection   IR IMAGING GUIDED PORT INSERTION  06/29/2021   IR RADIOLOGIST EVAL & MGMT  11/25/2023   PORTACATH PLACEMENT     RADIOLOGY WITH ANESTHESIA N/A 01/01/2024   Procedure: CT CRYOABLATIONWITH ANESTHESIA;  Surgeon: Myrlene Asper, DO;  Location: [facility];  Service: Anesthesiology;  Laterality: N/A;   Spine injection     Pain control   VIDEO BRONCHOSCOPY WITH ENDOBRONCHIAL ULTRASOUND N/A 05/12/2021   Procedure: VIDEO BRONCHOSCOPY WITH ENDOBRONCHIAL ULTRASOUND;  Surgeon: Prudy Brownie, DO;  Location: [facility];  Service: Pulmonary;  Laterality: N/A;   WISDOM TOOTH EXTRACTION     no anesthesia involed       Home Medications    Prior to Admission medications   Medication Sig Start Date End Date Taking? Authorizing Provider  oseltamivir  (TAMIFLU ) 75 MG capsule Take 1 capsule (75 mg total) by mouth every 12 (twelve) hours. 01/13/24  Yes Gregoria Leas, NP  acetaminophen  (TYLENOL ) 500 MG tablet Take 500-1,000 mg by mouth every 6 (six) hours as needed (for back pain.).    [provider]  lidocaine -prilocaine  (EMLA ) cream Apply 1 application topically as needed. Patient not taking: Reported on 05/30/2023 06/30/21   Heilingoetter, Cassandra L, PA-C  prochlorperazine  (COMPAZINE ) 10 MG tablet Take 1 tablet (10 mg total) by mouth every 6 (six)  hours as needed for nausea or vomiting. Patient not taking: Reported on 05/30/2023 02/05/22   Marlene Simas, MD  triamcinolone  cream (KENALOG ) 0.1 % Apply 1 Application topically 2 (two) times daily as needed. 04/18/23   Marlene Simas, MD    Family History Family History  Problem Relation Age of Onset   Heart attack Maternal Grandmother    CAD Maternal Grandmother    Diabetes Maternal Grandfather     Social History Social History   Tobacco Use   Smoking status: Former    Current packs/day: 0.00     Average packs/day: 1 pack/day for 25.0 years (25.0 ttl pk-yrs)    Types: Cigarettes    Start date: 04/18/1996    Quit date: 04/18/2021    Years since quitting: 2.7   Smokeless tobacco: Never   Tobacco comments:    currently 7 cigs per day/ 04/18/21//lmr  Vaping Use   Vaping status: Former  Substance Use Topics   Alcohol use: Not Currently   Drug use: No     Allergies   Penicillins and Zofran  [ondansetron ]   Review of Systems Review of Systems   Physical Exam Triage Vital Signs ED Triage Vitals  Encounter Vitals Group     BP      Systolic BP Percentile      Diastolic BP Percentile      Pulse      Resp      Temp      Temp src      SpO2      Weight      Height      Head Circumference      Peak Flow      Pain Score      Pain Loc      Pain Education      Exclude from Growth Chart    No data found.  Updated Vital Signs BP 109/74 (BP Location: Right Arm)   Pulse 98   Temp 98 F (36.7 C) (Oral)   Resp 18   SpO2 95%   Visual Acuity Right Eye Distance:   Left Eye Distance:   Bilateral Distance:    Right Eye Near:   Left Eye Near:    Bilateral Near:     Physical Exam Constitutional:      General: He is not in acute distress.    Appearance: He is not ill-appearing.  HENT:     Head: Normocephalic and atraumatic.  Cardiovascular:     Rate and Rhythm: Normal rate and regular rhythm.     Pulses: Normal pulses.     Heart sounds: Normal heart sounds.  Pulmonary:     Effort: Pulmonary effort is normal.     Comments: Diminished left upper Musculoskeletal:        General: Normal range of motion.     Cervical back: Normal range of motion.  Skin:    General: Skin is warm.     Capillary Refill: Capillary refill takes less than 2 seconds.  Neurological:     General: No focal deficit present.     Mental Status: He is alert and oriented to person, place, and time.  Psychiatric:        Mood and Affect: Mood normal.        Behavior: Behavior normal.       UC Treatments / Results  Labs (all labs ordered are listed, but only abnormal results are displayed) Labs Reviewed  POCT INFLUENZA A/B - Abnormal; Notable for the following  components:      Result Value   Influenza A, POC Positive (*)    All other components within normal limits    EKG   Radiology No results found.  Procedures Procedures (including critical care time)  Medications Ordered in UC Medications - No data to display  Initial Impression / Assessment and Plan / UC Course  I have reviewed the triage vital signs and the nursing notes.  Pertinent labs & imaging results that were available during my care of the patient were reviewed by me and considered in my medical decision making (see chart for details).     Flu like symptoms  Final Clinical Impressions(s) / UC Diagnoses   Final diagnoses:  Influenza A     Discharge Instructions      Your your influenza test is positive for flu A.  You have been prescribed Tamiflu  75 mg 1 tablet every 12 hours for 5 days.  We encourage conservative treatment with symptom relief. You are encouraged to hydrate well and get plenty rest. We encourage you to use Tylenol  alternating with Ibuprofen  for your fever if not contraindicated. (Remember to use as directed do not exceed daily dosing recommendations).  Your cough can be soothed with a cough suppressant.  You should also consider throat lozenges and chloraseptic spray. We also encourage salt water gargles for your sore throat.       ED Prescriptions     Medication Sig Dispense Auth. Provider   oseltamivir  (TAMIFLU ) 75 MG capsule Take 1 capsule (75 mg total) by mouth every 12 (twelve) hours. 10 capsule Gregoria Leas, NP      PDMP not reviewed this encounter.   Eleanore Grey [employer] [place] 01/13/24 [phone]

## 2024-01-13 NOTE — ED Triage Notes (Signed)
 Pt reports he has a headache, chest congestion, body aches since this morning.

## 2024-02-06 ENCOUNTER — Telehealth: Payer: Self-pay | Admitting: Internal Medicine

## 2024-02-07 ENCOUNTER — Other Ambulatory Visit: Payer: Self-pay

## 2024-02-07 NOTE — Progress Notes (Signed)
 [facility] OFFICE PROGRESS NOTE  Cameron Found, MD [address] [place] [address]  DIAGNOSIS:  1) Stage IV (T2b, N2, M1 B) non-small cell lung cancer, adenocarcinoma presented with large central left lower lobe lung mass with left hilar and mediastinal lymphadenopathy as well as abdominal retroperitoneal lymphadenopathy diagnosed in May 2022.  The patient also has bilateral parotid gland nodules that need close monitoring. 2) Renal Cell Carcinoma, grade 3. Underwent ablation on 1/292/5.    [facility] MOLECULAR STUDY: Results with Therapy Associations BIOMARKER METHOD ANALYTE RESULT THERAPY ASSOCIATION BIOMARKER LEVEL* .PD-L1 (22c3) IHC Protein Positive, TPS: 50% BENEFIT cemiplimab, pembrolizumab Level 1 .PD-L1 (28-8) IHC Protein Positive  1+, 50% BENEFIT nivolumab/ipilimumab combination Level 1 .TMB Seq DNA-Tumor High, 18 mut/Mb BENEFIT pembrolizumab Level 2 . alectinib, ceritinib, crizotinib, lorlatinib Level 1 . IHC Protein Negative  0 brigatinib Level 2 . ALK Seq RNA-Tumor Fusion Not Detected LACK OF BENEFIT alectinib, brigatinib, ceritinib, crizotinib, lorlatinib Level 2 .BRAF Seq DNA-Tumor Mutation Not Detected LACK OF BENEFIT dabrafenib and trametinib combination therapy, vemurafenib Level 2 .EGFR Seq DNA-Tumor Mutation Not Detected LACK OF BENEFIT erlotinib, gefitinib Level 2 .KRAS Seq DNA-Tumor Mutation Not Detected LACK OF BENEFIT sotorasib Level 2 .RET Seq RNA-Tumor Fusion Not Detected LACK OF BENEFIT pralsetinib, selpercatinib Level 2 .ROS1 Seq RNA-Tumor Fusion Not Detected LACK OF BENEFIT ceritinib, crizotinib, entrectinib, lorlatinib Level 2 . CNA-Seq DNA-Tumor Amplification Not Detected .MET Seq RNA-Tumor Variant Transcript Not Detected LACK OF BENEFIT crizotinib Level 3  PRIOR THERAPY: 1) Palliative radiotherapy to the large central left lower lobe lung mass under the care of Dr. Mitzi Hansen. 2) Systemic chemotherapy with carboplatin for  AUC of 5, Alimta 500 Mg/M2 and possibly Keytruda 200 Mg IV every 3 weeks.  First dose expected June 14, 2021.  Starting from cycle #5 the patient is on maintenance treatment with Alimta and Keytruda every 3 weeks.  Status post 35 cycles of treatment.  Starting from cycle #12 the dose of Alimta to 400 Mg/M2 because of his increasing fatigue and side effects.  Alimta was discontinued secondary to renal insufficiency. 3) Ablation to the kidney on 01/01/24 by IR   CURRENT THERAPY: Observation   INTERVAL HISTORY: Cameron Harper 48 y.o. male returns to the clinic today for a follow-up visit.  The patient was last seen in the clinic by Dr. Arbutus Ped on 10/24/2023.  The patient is followed for his history of stage IV lung cancer.  Alimta was discontinued due to renal insufficiency for which he follows with nephrology.  He completed 2 years of Rande Lawman and is currently on observation.  He also had a lesion on his kidney that had been being followed on surveillance imaging.  He was referred to IR and underwent biopsy and ablation of the left kidney on 01/01/2024 and tolerated it well.  The pathology showed renal cell carcinoma. He had a follow up with IR yesterday.    In the interval since being seen, he did have the flu but recovered well and only had mild symptoms. He was treated with tamiflu.  He denies any fever, chills, night sweats, unexplained weight loss.  Denies any chest pain or hemoptysis.  He denies any significant cough. He denies any nausea, vomiting, diarrhea, or constipation.  Patient very rarely gets headaches. However, he had had slightly more headaches last month.  Therefore, he requested imaging of his brain which was negative for metastatic disease. Denies any rashes.  His breathing and shortness of breath is stable  He recently  had a restaging CT scan performed.  He is here today for evaluation and repeat blood work and to review his scan results.    MEDICAL HISTORY: Past Medical History:   Diagnosis Date   Chronic back pain    Eczema    Lung cancer (HCC)     ALLERGIES:  is allergic to penicillins and zofran [ondansetron].  MEDICATIONS:  Current Outpatient Medications  Medication Sig Dispense Refill   acetaminophen (TYLENOL) 500 MG tablet Take 500-1,000 mg by mouth every 6 (six) hours as needed (for back pain.).     lidocaine-prilocaine (EMLA) cream Apply 1 application topically as needed. (Patient not taking: Reported on 05/30/2023) 30 g 2   oseltamivir (TAMIFLU) 75 MG capsule Take 1 capsule (75 mg total) by mouth every 12 (twelve) hours. 10 capsule 0   prochlorperazine (COMPAZINE) 10 MG tablet Take 1 tablet (10 mg total) by mouth every 6 (six) hours as needed for nausea or vomiting. (Patient not taking: Reported on 05/30/2023) 30 tablet 0   triamcinolone cream (KENALOG) 0.1 % Apply 1 Application topically 2 (two) times daily as needed. 30 g 0   No current facility-administered medications for this visit.    SURGICAL HISTORY:  Past Surgical History:  Procedure Laterality Date   BRONCHIAL BRUSHINGS  05/12/2021   Procedure: BRONCHIAL BRUSHINGS;  Surgeon: Josephine Igo, DO;  Location: [facility];  Service: Pulmonary;;   BRONCHIAL DILITATION  05/12/2021   Procedure: BRONCHIAL DILITATION;  Surgeon: Josephine Igo, DO;  Location: [facility];  Service: Pulmonary;;   BRONCHIAL NEEDLE ASPIRATION BIOPSY  05/12/2021   Procedure: BRONCHIAL NEEDLE ASPIRATION BIOPSIES;  Surgeon: Josephine Igo, DO;  Location: [facility];  Service: Pulmonary;;   BRONCHIAL WASHINGS  05/12/2021   Procedure: BRONCHIAL WASHINGS;  Surgeon: Josephine Igo, DO;  Location: [facility];  Service: Pulmonary;;   CRYOTHERAPY  05/12/2021   Procedure: CRYOTHERAPY;  Surgeon: Josephine Igo, DO;  Location: [facility];  Service: Pulmonary;;   HEMOSTASIS CONTROL  05/12/2021   Procedure: HEMOSTASIS CONTROL;  Surgeon: Josephine Igo, DO;  Location: [facility];  Service: Pulmonary;;  epi  injection   IR IMAGING GUIDED PORT INSERTION  06/29/2021   IR RADIOLOGIST EVAL & MGMT  11/25/2023   PORTACATH PLACEMENT     RADIOLOGY WITH ANESTHESIA N/A 01/01/2024   Procedure: CT CRYOABLATIONWITH ANESTHESIA;  Surgeon: Gilmer Mor, DO;  Location: [facility];  Service: Anesthesiology;  Laterality: N/A;   Spine injection     Pain control   VIDEO BRONCHOSCOPY WITH ENDOBRONCHIAL ULTRASOUND N/A 05/12/2021   Procedure: VIDEO BRONCHOSCOPY WITH ENDOBRONCHIAL ULTRASOUND;  Surgeon: Josephine Igo, DO;  Location: [facility];  Service: Pulmonary;  Laterality: N/A;   WISDOM TOOTH EXTRACTION     no anesthesia involed    REVIEW OF SYSTEMS:   Review of Systems  Constitutional: Negative for appetite change, chills, fatigue, fever and unexpected weight change.  HENT:   Negative for mouth sores, nosebleeds, sore throat and trouble swallowing.   Eyes: Negative for eye problems and icterus.  Respiratory: Negative for cough, hemoptysis, shortness of breath and wheezing.   Cardiovascular: Negative for chest pain and leg swelling.  Gastrointestinal: Negative for abdominal pain, constipation, diarrhea, nausea and vomiting.  Genitourinary: Negative for bladder incontinence, difficulty urinating, dysuria, frequency and hematuria.   Musculoskeletal: Negative for back pain, gait problem, neck pain and neck stiffness.  Skin: Negative for itching and rash.  Neurological: Negative for dizziness, extremity weakness, gait problem, headaches, light-headedness and seizures.  Hematological: Negative  for adenopathy. Does not bruise/bleed easily.  Psychiatric/Behavioral: Negative for confusion, depression and sleep disturbance. The patient is not nervous/anxious.     PHYSICAL EXAMINATION:  There were no vitals taken for this visit.  ECOG PERFORMANCE STATUS: 1  Physical Exam  Constitutional: Oriented to person, place, and time and well-developed, well-nourished, and in no distress.  HENT:  Head: Normocephalic and  atraumatic.  Mouth/Throat: Oropharynx is clear and moist. No oropharyngeal exudate.  Eyes: Conjunctivae are normal. Right eye exhibits no discharge. Left eye exhibits no discharge. No scleral icterus.  Neck: Normal range of motion. Neck supple.  Cardiovascular: Normal rate, regular rhythm, normal heart sounds and intact distal pulses.   Pulmonary/Chest: Effort normal and breath sounds normal. No respiratory distress. No wheezes. No rales.  Abdominal: Soft. Bowel sounds are normal. Exhibits no distension and no mass. There is no tenderness.  Musculoskeletal: Normal range of motion. Exhibits no edema.  Lymphadenopathy:    No cervical adenopathy.  Neurological: Alert and oriented to person, place, and time. Exhibits normal muscle tone. Gait normal. Coordination normal.  Skin: Skin is warm and dry. No rash noted. Not diaphoretic. No erythema. No pallor.  Psychiatric: Mood, memory and judgment normal.  Vitals reviewed.  LABORATORY DATA: Lab Results  Component Value Date   WBC 8.8 01/13/2024   HGB 14.0 01/13/2024   HCT 43.4 01/13/2024   MCV 89.9 01/13/2024   PLT 224 01/13/2024      Chemistry      Component Value Date/Time   NA 138 01/13/2024 0937   K 4.3 01/13/2024 0937   CL 105 01/13/2024 0937   CO2 27 01/13/2024 0937   BUN 15 01/13/2024 0937   CREATININE 1.33 (H) 01/13/2024 0937      Component Value Date/Time   CALCIUM 9.0 01/13/2024 0937   ALKPHOS 103 01/13/2024 0937   AST 12 (L) 01/13/2024 0937   ALT 15 01/13/2024 0937   BILITOT 0.5 01/13/2024 4403       RADIOGRAPHIC STUDIES:  CT HEAD W & WO CONTRAST ( ) Result Date: 01/21/2024 CLINICAL DATA:  Staging for non-small-cell lung cancer, new onset headaches. EXAM: CT HEAD WITHOUT AND WITH CONTRAST TECHNIQUE: Contiguous axial images were obtained from the base of the skull through the vertex without and with intravenous contrast. RADIATION DOSE REDUCTION: This exam was performed according to the departmental  dose-optimization program which includes automated exposure control, adjustment of the mA and/or kV according to patient size and/or use of iterative reconstruction technique. CONTRAST:  OMNIPAQUE IOHEXOL 350 MG/ML SOLN COMPARISON:  MRI brain 06/02/2021 FINDINGS: Brain: No acute intracranial hemorrhage. No CT evidence of acute infarct. No edema, mass effect, or midline shift. The basilar cisterns are patent. No abnormal enhancement noted. Ventricles: The ventricles are normal. Vascular: No hyperdense vessel or unexpected calcification. Skull: No acute or aggressive finding. Orbits: Orbits are symmetric. Sinuses: Mucosal thickening in the partially visualized right maxillary sinus with a possible mucous retention cyst within. The visualized paranasal sinuses are otherwise clear. Other: Mastoid air cells are clear. IMPRESSION: No CT evidence of acute intracranial abnormality. No abnormal enhancement. Mucosal thickening in the partially visualized right maxillary sinus. Electronically Signed   By: Emily Filbert M.D.   On: 01/21/2024 17:03   CT CHEST ABDOMEN PELVIS W CONTRAST Result Date: 01/21/2024 CLINICAL DATA:  Non-small cell lung cancer. LEFT renal ablation. * Tracking Code: BO * EXAM: CT CHEST, ABDOMEN, AND PELVIS WITH CONTRAST TECHNIQUE: Multidetector CT imaging of the chest, abdomen and pelvis was performed following the  standard protocol during bolus administration of intravenous contrast. RADIATION DOSE REDUCTION: This exam was performed according to the departmental dose-optimization program which includes automated exposure control, adjustment of the mA and/or kV according to patient size and/or use of iterative reconstruction technique. CONTRAST:  OMNIPAQUE IOHEXOL 350 MG/ML SOLN COMPARISON:  09/26/2023 FINDINGS: CT CHEST FINDINGS Cardiovascular: No significant vascular findings. Normal heart size. No pericardial effusion. Port in the anterior chest wall with tip in distal SVC.  Mediastinum/Nodes: No axillary or supraclavicular adenopathy. No mediastinal or hilar adenopathy. No pericardial fluid. Esophagus normal. Lungs/Pleura: Linear fibrotic change along the LEFT hilum consistent with radiation therapy. Pattern is unchanged from comparison CT. There is a chronic small volume LEFT pleural effusion. There is subtle thin No suspicious pulmonary nodules.  Septations within the effusion. Musculoskeletal: No aggressive osseous lesion. CT ABDOMEN AND PELVIS FINDINGS Hepatobiliary: No focal hepatic lesion. No biliary ductal dilatation. Gallbladder is normal. Common bile duct is normal. Pancreas: Pancreas is normal. No ductal dilatation. No pancreatic inflammation. Spleen: Normal spleen Adrenals/urinary tract: Adrenal glands normal. Large region of hypoperfusion in the lateral aspect the LEFT kidney at site of prior enhancing renal lesion. Findings consistent with ablation site devascularization. Lesion measures 4.6 x 2.6 cm. Within the upper pole of the LEFT kidney along the medial border 1.5 cm lesion (64/2) is not changed in size from comparison MRI. This lesion had imaging characteristics typical of a benign hemorrhagic cyst. No RIGHT renal lesions. Ureters and bladder Stomach/Bowel: Stomach, small bowel, appendix, and cecum are normal. The colon and rectosigmoid colon are normal. Vascular/Lymphatic: Abdominal aorta is normal caliber. There is no retroperitoneal or periportal lymphadenopathy. No pelvic lymphadenopathy. Reproductive: Prostate unremarkable Other: No free fluid. Musculoskeletal: No aggressive osseous lesion. IMPRESSION: CHEST: 1. No evidence of lung cancer recurrence or metastasis. 2. 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 3. Chronic  LEFT pleural effusion. PELVIS: 1. No evidence of lung cancer recurrence or metastasis in the abdomen pelvis. 2. Post ablation changes in the LEFT kidney.  Baseline exam. 3. Stable benign hemorrhagic cyst in the upper pole of the LEFT kidney.  (Bosniak 2) Electronically Signed   By: Genevive Bi M.D.   On: 01/21/2024 12:33     ASSESSMENT/PLAN:  This is a very pleasant 48 year old Caucasian male diagnosed with stage IV (T2b, N2, M1 B) non-small cell lung cancer, adenocarcinoma.  He presented with a large central left lower lobe lung mass in addition to left hilar mediastinal lymphadenopathy.  He also has retroperitoneal abdominal lymphadenopathy.  He was diagnosed in May 2022.  His PD-L1 expression is 50%.  His molecular studies by [facility] did not show any evidence of any actionable mutations.  He underwent palliative radiotherapy to the large left lower lobe lung mass under the care of Dr. Mitzi Hansen.  He completed this on 06/12/2021.   He completed 2 years of systemic treatment. He was on chemotherapy with carboplatin for an AUC of 5, Alimta 500 mg per metered squared, and Keytruda 200 mg IV every 3 weeks.  He is status post 35 cycles.  Starting from cycle #5, the patient had been on maintenance Alimta and Keytruda.  The dose of Alimta was reduced to 400 mg per metered squared starting from cycle #12 due to fatigue.  Alimta was removed from the treatment plan starting from cycle #13 due to renal insufficiency.   The patient was seen with Dr. Arbutus Ped today.  Dr. Arbutus Ped personally and independently reviewed the scan and discussed results with the patient today.  The scan showed no evidence of disease progression.  Dr. Arbutus Ped recommends he continue on observation with a repeat CT scan in 3 months  [place], and the appointment note to draw an i-STAT creatinine the day of his scan and hold the IV contrast if his kidney function is above parameters.  We will see him back for labs and follow up visit in 3 months and to review his scan results.   For his renal cell carcinoma, he is status post ablation which was performed on 01/01/24. He is following with  IR.  He saw them yesterday.  I will also arrange Port-A-Cath flush appointments every 6 to 8  weeks.   The patient was advised to call immediately if he has any concerning symptoms in the interval. The patient voices understanding of current disease status and treatment options and is in agreement with the current care plan. All questions were answered. The patient knows to call the clinic with any problems, questions or concerns. We can certainly see the patient much sooner if necessary     No orders of the defined types were placed in this encounter.    Elysia Grand L Nicolas Banh, PA-C 02/07/24  ADDENDUM: Hematology/Oncology Attending: I had a face-to-face encounter with the patient today.  I reviewed his record, lab, scan and recommended his care plan.  This is a very pleasant 48 years old white male with a stage IV non-small cell lung cancer, adenocarcinoma diagnosed in May 2022 status post treatment with systemic chemotherapy with carboplatin, Alimta and Keytruda followed by maintenance Alimta and Keytruda and then single agent Keytruda for 35 cycles.  His treatment has been on hold after he completed 2 years of treatment with immunotherapy.  The patient also has a history of renal cell carcinoma status post ablation of the left kidney tumor by interventional radiology in January 2025.  He is feeling fine these days with no concerning complaints.  He had repeat CT scan of the chest, abdomen and pelvis performed recently.  I personally independently reviewed the scan and discussed the result with the patient today.  His scan showed no concerning finding for disease progression. I recommended for the patient to continue on observation with repeat CT scan of the chest, abdomen and pelvis in 3 months. The patient was advised to call immediately if he has any concerning symptoms in the interval. The total time spent in the appointment was 30 minutes. Disclaimer: This note was dictated with voice recognition software. Similar sounding words can inadvertently be transcribed and may be  missed upon review. Lajuana Matte, MD

## 2024-02-10 ENCOUNTER — Other Ambulatory Visit: Payer: Self-pay

## 2024-02-12 ENCOUNTER — Ambulatory Visit
Admission: RE | Admit: 2024-02-12 | Discharge: 2024-02-12 | Disposition: A | Discharge status: Home / Self Care | Payer: HMO | Source: Ambulatory Visit | Attending: Radiology | Admitting: Radiology

## 2024-02-12 DIAGNOSIS — C642 Malignant neoplasm of left kidney, except renal pelvis: Secondary | ICD-10-CM | POA: Diagnosis not present

## 2024-02-12 DIAGNOSIS — N2889 Other specified disorders of kidney and ureter: Secondary | ICD-10-CM

## 2024-02-12 HISTORY — PX: IR RADIOLOGIST EVAL & MGMT: IMG5224

## 2024-02-12 NOTE — Progress Notes (Signed)
 Chief Complaint: Left renal mass SP ablation   Referring Physician(s): Harper,Cameron R   Oncology: Dr. Arbutus Harper PCP: Dr. Phillips Harper   History of Present Illness: Cameron Harper is a 48 y.o. male presenting today to [facility] clinic, scheduled follow up after image guided ablation of left renal tumor.    He joins [place] today by way of telemedicine visit. We confirmed his ID with 2 personal identifiers.   History: (e record)  We first met Cameron Harper 11/25/23.  At the time in remission for non-small cell lung cancer, adenocarcinoma, Dr. Arbutus Harper treating him.   His diagnosis/therapy was initiated May 2022. Note from Dr. Arbutus Harper 10/24/23: ______________________ DIAGNOSIS:  Stage IV (T2b, N2, M1 B) non-small cell lung cancer, adenocarcinoma presented with large central left lower lobe lung mass with left hilar and mediastinal lymphadenopathy as well as abdominal retroperitoneal lymphadenopathy diagnosed in May 2022.  PRIOR THERAPY:  1) Palliative radiotherapy to the large central left lower lobe lung mass under the care of Dr. Mitzi Harper. 2) Systemic chemotherapy with carboplatin for AUC of 5, Alimta 500 Mg/M2 and possibly Keytruda 200 Mg IV every 3 weeks.  First dose expected June 14, 2021.  Starting from cycle #5 the patient is on maintenance treatment with Alimta and Keytruda every 3 weeks.  Status post 35 cycles of treatment.  Starting from cycle #12 the dose of Alimta to 400 Mg/M2 because of his increasing fatigue and side effects.  Alimta was discontinued secondary to renal insufficiency. CURRENT THERAPY: Observation. _____________________________   On his surveillance studies, a left renal mass was identified. While first described 09/26/23, this was likely present dating to CT 10/16/21.  In the interval, the CT scans were performed without contrast, secondary to renal insufficiency.    On recent CT and Cameron the estimated size is 1.8cm x 1.7cm.    CT 09/26/23          Cameron 10/10/23           Interval History:  We treated Cameron Harper 01/01/24 with CT/US guided tissue ablation of left kidney mass, using cryo technology. He was discharged same day home.   We performed a same session biopsy, with result:    Currently, he tells me he is feeling fine.  He does report about a week ago or so that he "moved some cabinets" and has some mild back pain.  He wonders whether related to the kidney.  He specifically denies any hematuria.  I reassured him most likely MSK type pain.    He has had interval C/A/P CT scan 01/13/24 with his oncology team for his hx of lung CA.  This shows the ablation site with expected post-surgical changes.  No concerns.    Past Medical History:  Diagnosis Date   Chronic back pain    Eczema    Lung cancer [facility])     Past Surgical History:  Procedure Laterality Date   BRONCHIAL BRUSHINGS  05/12/2021   Procedure: BRONCHIAL BRUSHINGS;  Surgeon: Cameron Igo, DO;  Location: [facility];  Service: Pulmonary;;   BRONCHIAL DILITATION  05/12/2021   Procedure: BRONCHIAL DILITATION;  Surgeon: Cameron Igo, DO;  Location: [facility];  Service: Pulmonary;;   BRONCHIAL NEEDLE ASPIRATION BIOPSY  05/12/2021   Procedure: BRONCHIAL NEEDLE ASPIRATION BIOPSIES;  Surgeon: Cameron Igo, DO;  Location: [facility];  Service: Pulmonary;;   BRONCHIAL WASHINGS  05/12/2021   Procedure: BRONCHIAL WASHINGS;  Surgeon: Cameron Igo, DO;  Location: [facility];  Service: Pulmonary;;  CRYOTHERAPY  05/12/2021   Procedure: CRYOTHERAPY;  Surgeon: Cameron Igo, DO;  Location: [facility];  Service: Pulmonary;;   HEMOSTASIS CONTROL  05/12/2021   Procedure: HEMOSTASIS CONTROL;  Surgeon: Cameron Igo, DO;  Location: [facility];  Service: Pulmonary;;  epi injection   IR IMAGING GUIDED PORT INSERTION  06/29/2021   IR RADIOLOGIST EVAL & MGMT  11/25/2023   PORTACATH PLACEMENT     RADIOLOGY WITH ANESTHESIA N/A 01/01/2024   Procedure: CT CRYOABLATIONWITH  ANESTHESIA;  Surgeon: Gilmer Mor, DO;  Location: [facility];  Service: Anesthesiology;  Laterality: N/A;   Spine injection     Pain control   VIDEO BRONCHOSCOPY WITH ENDOBRONCHIAL ULTRASOUND N/A 05/12/2021   Procedure: VIDEO BRONCHOSCOPY WITH ENDOBRONCHIAL ULTRASOUND;  Surgeon: Cameron Igo, DO;  Location: [facility];  Service: Pulmonary;  Laterality: N/A;   WISDOM TOOTH EXTRACTION     no anesthesia involed    Allergies: Penicillins and Zofran [ondansetron]  Medications: Prior to Admission medications   Medication Sig Start Date End Date Taking? Authorizing Provider  acetaminophen (TYLENOL) 500 MG tablet Take 500-1,000 mg by mouth every 6 (six) hours as needed (for back pain.).    [provider]  lidocaine-prilocaine (EMLA) cream Apply 1 application topically as needed. Patient not taking: Reported on 05/30/2023 06/30/21   Heilingoetter, Cassandra L, PA-C  oseltamivir (TAMIFLU) 75 MG capsule Take 1 capsule (75 mg total) by mouth every 12 (twelve) hours. 01/13/24   Barbette Merino, NP  prochlorperazine (COMPAZINE) 10 MG tablet Take 1 tablet (10 mg total) by mouth every 6 (six) hours as needed for nausea or vomiting. Patient not taking: Reported on 05/30/2023 02/05/22   Si Gaul, MD  triamcinolone cream (KENALOG) 0.1 % Apply 1 Application topically 2 (two) times daily as needed. 04/18/23   Si Gaul, MD     Family History  Problem Relation Age of Onset   Heart attack Maternal Grandmother    CAD Maternal Grandmother    Diabetes Maternal Grandfather     Social History   Socioeconomic History   Marital status: Married    Spouse name: Not on file   Number of children: Not on file   Years of education: Not on file   Highest education level: Not on file  Occupational History   Not on file  Tobacco Use   Smoking status: Former    Current packs/day: 0.00    Average packs/day: 1 pack/day for 25.0 years (25.0 ttl pk-yrs)    Types: Cigarettes    Start date:  04/18/1996    Quit date: 04/18/2021    Years since quitting: 2.8   Smokeless tobacco: Never   Tobacco comments:    currently 7 cigs per day/ 04/18/21//lmr  Vaping Use   Vaping status: Former  Substance and Sexual Activity   Alcohol use: Not Currently   Drug use: No   Sexual activity: Not on file  Other Topics Concern   Not on file  Social History Narrative   Not on file   Social Drivers of Health   Financial Resource Strain: Low Risk  (05/19/2021)   Overall Financial Resource Strain (CARDIA)    Difficulty of Paying Living Expenses: Not very hard  Food Insecurity: No Food Insecurity (05/19/2021)   Hunger Vital Sign    Worried About Running Out of Food in the Last Year: Never true    Ran Out of Food in the Last Year: Never true  Transportation Needs: No Transportation Needs (05/19/2021)   PRAPARE -  Administrator, Civil Service (Medical): No    Lack of Transportation (Non-Medical): No  Physical Activity: Not on file  Stress: Not on file  Social Connections: Socially Integrated (05/19/2021)   Social Connection and Isolation Panel [NHANES]    Frequency of Communication with Friends and Family: More than three times a week    Frequency of Social Gatherings with Friends and Family: More than three times a week    Attends Religious Services: More than 4 times per year    Active Member of [employer] or Organizations: Yes    Attends Engineer, structural: More than 4 times per year    Marital Status: Married       Review of Systems  Review of Systems: A 12 point ROS discussed and pertinent positives are indicated in the HPI above.  All other systems are negative.    Physical Exam No direct physical exam was performed (except for noted visual exam findings with Video Visits).    Vital Signs: There were no vitals taken for this visit.  Imaging: CT HEAD W & WO CONTRAST ( ) Result Date: 01/21/2024 CLINICAL DATA:  Staging for non-small-cell lung cancer, new onset  headaches. EXAM: CT HEAD WITHOUT AND WITH CONTRAST TECHNIQUE: Contiguous axial images were obtained from the base of the skull through the vertex without and with intravenous contrast. RADIATION DOSE REDUCTION: This exam was performed according to the departmental dose-optimization program which includes automated exposure control, adjustment of the mA and/or kV according to patient size and/or use of iterative reconstruction technique. CONTRAST:  OMNIPAQUE IOHEXOL 350 MG/ML SOLN COMPARISON:  MRI brain 06/02/2021 FINDINGS: Brain: No acute intracranial hemorrhage. No CT evidence of acute infarct. No edema, mass effect, or midline shift. The basilar cisterns are patent. No abnormal enhancement noted. Ventricles: The ventricles are normal. Vascular: No hyperdense vessel or unexpected calcification. Skull: No acute or aggressive finding. Orbits: Orbits are symmetric. Sinuses: Mucosal thickening in the partially visualized right maxillary sinus with a possible mucous retention cyst within. The visualized paranasal sinuses are otherwise clear. Other: Mastoid air cells are clear. IMPRESSION: No CT evidence of acute intracranial abnormality. No abnormal enhancement. Mucosal thickening in the partially visualized right maxillary sinus. Electronically Signed   By: Emily Filbert M.D.   On: 01/21/2024 17:03   CT CHEST ABDOMEN PELVIS W CONTRAST Result Date: 01/21/2024 CLINICAL DATA:  Non-small cell lung cancer. LEFT renal ablation. * Tracking Code: BO * EXAM: CT CHEST, ABDOMEN, AND PELVIS WITH CONTRAST TECHNIQUE: Multidetector CT imaging of the chest, abdomen and pelvis was performed following the standard protocol during bolus administration of intravenous contrast. RADIATION DOSE REDUCTION: This exam was performed according to the departmental dose-optimization program which includes automated exposure control, adjustment of the mA and/or kV according to patient size and/or use of iterative reconstruction technique.  CONTRAST:  OMNIPAQUE IOHEXOL 350 MG/ML SOLN COMPARISON:  09/26/2023 FINDINGS: CT CHEST FINDINGS Cardiovascular: No significant vascular findings. Normal heart size. No pericardial effusion. Port in the anterior chest wall with tip in distal SVC. Mediastinum/Nodes: No axillary or supraclavicular adenopathy. No mediastinal or hilar adenopathy. No pericardial fluid. Esophagus normal. Lungs/Pleura: Linear fibrotic change along the LEFT hilum consistent with radiation therapy. Pattern is unchanged from comparison CT. There is a chronic small volume LEFT pleural effusion. There is subtle thin No suspicious pulmonary nodules.  Septations within the effusion. Musculoskeletal: No aggressive osseous lesion. CT ABDOMEN AND PELVIS FINDINGS Hepatobiliary: No focal hepatic lesion. No biliary ductal dilatation. Gallbladder is  normal. Common bile duct is normal. Pancreas: Pancreas is normal. No ductal dilatation. No pancreatic inflammation. Spleen: Normal spleen Adrenals/urinary tract: Adrenal glands normal. Large region of hypoperfusion in the lateral aspect the LEFT kidney at site of prior enhancing renal lesion. Findings consistent with ablation site devascularization. Lesion measures 4.6 x 2.6 cm. Within the upper pole of the LEFT kidney along the medial border 1.5 cm lesion (64/2) is not changed in size from comparison MRI. This lesion had imaging characteristics typical of a benign hemorrhagic cyst. No RIGHT renal lesions. Ureters and bladder Stomach/Bowel: Stomach, small bowel, appendix, and cecum are normal. The colon and rectosigmoid colon are normal. Vascular/Lymphatic: Abdominal aorta is normal caliber. There is no retroperitoneal or periportal lymphadenopathy. No pelvic lymphadenopathy. Reproductive: Prostate unremarkable Other: No free fluid. Musculoskeletal: No aggressive osseous lesion. IMPRESSION: CHEST: 1. No evidence of lung cancer recurrence or metastasis. 2. 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 3.  Chronic  LEFT pleural effusion. PELVIS: 1. No evidence of lung cancer recurrence or metastasis in the abdomen pelvis. 2. Post ablation changes in the LEFT kidney.  Baseline exam. 3. Stable benign hemorrhagic cyst in the upper pole of the LEFT kidney. (Bosniak 2) Electronically Signed   By: Genevive Bi M.D.   On: 01/21/2024 12:33    Labs:  CBC: Recent Labs    06/26/23 1026 09/26/23 0850 12/25/23 0944 01/13/24 0937  WBC 6.0 6.1 6.0 8.8  HGB 13.3 13.5 15.1 14.0  HCT 40.6 41.6 48.3 43.4  PLT 243 233 246 224    COAGS: Recent Labs    12/25/23 0944  INR 1.0    BMP: Recent Labs    06/26/23 1026 09/26/23 0850 12/25/23 0944 01/13/24 0937  NA 140 140 139 138  K 4.2 4.1 4.4 4.3  CL 108 105 105 105  CO2 29 30 26 27   GLUCOSE 68* 86 87 94  BUN 16 14 17 15   CALCIUM 9.4 9.3 9.3 9.0  CREATININE 1.22 1.36* 1.16 1.33*  GFRNONAA >60 >60 >60 >60    LIVER FUNCTION TESTS: Recent Labs    05/30/23 0749 06/26/23 1026 09/26/23 0850 01/13/24 0937  BILITOT 0.3 0.3 0.5 0.5  AST 12* 14* 15 12*  ALT 13 15 16 15   ALKPHOS 110 116 110 103  PROT 7.3 7.0 7.3 7.1  ALBUMIN 3.6 3.9 4.0 3.9    TUMOR MARKERS: No results for input(s): "AFPTM", "CEA", "CA199", "CHROMGRNA" in the last 8760 hours.  Assessment and Plan:  Cameron Harper is 48 yo male SP Ct guided cryo ablation of left side RCC (path proven) 01/01/24.    He is doing fine.   We discussed the role now for surveillance, with our first anticipated contrast CT in about 3 months from treatment. I think after the first scan, as long as looks good, we can consolidate scanning to Dr. Asa Lente schedule for his previous lung CA.   Plan: - follow up office visit in ~3 months, around early May, with contrast enhanced CT of the abdomen.    Electronically Signed: Gilmer Mor 02/12/2024, 8:44 AM   I spent a total of    15 Minutes in remote  clinical consultation, greater than 50% of which was counseling/coordinating care for follow up  .    Visit type: Audio and video (epic video conference).   Alternative for in-person consultation at [facility], [address] [place], [place], [place]. This visit type was conducted due to national recommendations for restrictions regarding the COVID-19 Pandemic (e.g. social  distancing).  This format is felt to be most appropriate for this patient at this time.  All issues noted in this document were discussed and addressed.

## 2024-02-13 ENCOUNTER — Inpatient Hospital Stay

## 2024-02-13 ENCOUNTER — Other Ambulatory Visit

## 2024-02-13 ENCOUNTER — Other Ambulatory Visit: Payer: Self-pay | Admitting: Physician Assistant

## 2024-02-13 ENCOUNTER — Inpatient Hospital Stay: Attending: Physician Assistant | Admitting: Physician Assistant

## 2024-02-13 VITALS — BP 108/65 | HR 58 | Temp 98.2°F | Resp 16 | Ht 70.0 in | Wt 184.5 lb

## 2024-02-13 DIAGNOSIS — Z79620 Long term (current) use of immunosuppressive biologic: Secondary | ICD-10-CM | POA: Insufficient documentation

## 2024-02-13 DIAGNOSIS — N289 Disorder of kidney and ureter, unspecified: Secondary | ICD-10-CM | POA: Diagnosis not present

## 2024-02-13 DIAGNOSIS — C3432 Malignant neoplasm of lower lobe, left bronchus or lung: Secondary | ICD-10-CM | POA: Diagnosis not present

## 2024-02-13 DIAGNOSIS — C642 Malignant neoplasm of left kidney, except renal pelvis: Secondary | ICD-10-CM | POA: Diagnosis not present

## 2024-02-13 DIAGNOSIS — Z87891 Personal history of nicotine dependence: Secondary | ICD-10-CM | POA: Insufficient documentation

## 2024-02-13 DIAGNOSIS — Z95828 Presence of other vascular implants and grafts: Secondary | ICD-10-CM

## 2024-02-13 LAB — CBC WITH DIFFERENTIAL (CANCER CENTER ONLY)
Abs Immature Granulocytes: 0.04 10*3/uL (ref 0.00–0.07)
Basophils Absolute: 0.1 10*3/uL (ref 0.0–0.1)
Basophils Relative: 1 %
Eosinophils Absolute: 0.3 10*3/uL (ref 0.0–0.5)
Eosinophils Relative: 5 %
HCT: 41 % (ref 39.0–52.0)
Hemoglobin: 13.2 g/dL (ref 13.0–17.0)
Immature Granulocytes: 1 %
Lymphocytes Relative: 17 %
Lymphs Abs: 1 10*3/uL (ref 0.7–4.0)
MCH: 28.1 pg (ref 26.0–34.0)
MCHC: 32.2 g/dL (ref 30.0–36.0)
MCV: 87.4 fL (ref 80.0–100.0)
Monocytes Absolute: 0.7 10*3/uL (ref 0.1–1.0)
Monocytes Relative: 12 %
Neutro Abs: 3.9 10*3/uL (ref 1.7–7.7)
Neutrophils Relative %: 64 %
Platelet Count: 197 10*3/uL (ref 150–400)
RBC: 4.69 MIL/uL (ref 4.22–5.81)
RDW: 13.6 % (ref 11.5–15.5)
WBC Count: 6 10*3/uL (ref 4.0–10.5)
nRBC: 0 % (ref 0.0–0.2)

## 2024-02-13 LAB — CMP (CANCER CENTER ONLY)
ALT: 17 U/L (ref 0–44)
AST: 16 U/L (ref 15–41)
Albumin: 3.5 g/dL (ref 3.5–5.0)
Alkaline Phosphatase: 83 U/L (ref 38–126)
Anion gap: 7 (ref 5–15)
BUN: 17 mg/dL (ref 6–20)
CO2: 26 mmol/L (ref 22–32)
Calcium: 9 mg/dL (ref 8.9–10.3)
Chloride: 107 mmol/L (ref 98–111)
Creatinine: 1.39 mg/dL — ABNORMAL HIGH (ref 0.61–1.24)
GFR, Estimated: >60 mL/min (ref >60)
Glucose, Bld: 99 mg/dL (ref 70–99)
Potassium: 4.1 mmol/L (ref 3.5–5.1)
Sodium: 140 mmol/L (ref 135–145)
Total Bilirubin: 0.6 mg/dL (ref 0.0–1.2)
Total Protein: 7 g/dL (ref 6.5–8.1)

## 2024-02-13 LAB — TSH: TSH: 3.552 u[IU]/mL (ref 0.350–4.500)

## 2024-02-13 MED ORDER — SODIUM CHLORIDE 0.9% FLUSH
10.0000 mL | Freq: Once | INTRAVENOUS | Status: AC
  Administered 2024-02-13: 10 mL

## 2024-02-13 MED ORDER — HEPARIN SOD (PORK) LOCK FLUSH 100 UNIT/ML IV SOLN
500.0000 [IU] | Freq: Once | INTRAVENOUS | Status: AC
  Administered 2024-02-13: 500 [IU]

## 2024-02-14 ENCOUNTER — Other Ambulatory Visit: Payer: Self-pay

## 2024-02-16 ENCOUNTER — Encounter: Payer: Self-pay | Admitting: Internal Medicine

## 2024-02-16 ENCOUNTER — Encounter: Payer: Self-pay | Admitting: Physician Assistant

## 2024-02-27 ENCOUNTER — Other Ambulatory Visit: Payer: Self-pay

## 2024-03-25 ENCOUNTER — Telehealth: Payer: Self-pay | Admitting: Medical Oncology

## 2024-03-25 ENCOUNTER — Other Ambulatory Visit: Payer: Self-pay | Admitting: Medical Oncology

## 2024-03-25 NOTE — Telephone Encounter (Signed)
 R arm swelling/discomfort  @ bend of elbow on dorsal side. Noticed it  yesterday. He has a right port a cath.  Port flush appt tomorrow.   CS number given to pt.   Pt notified to contact PCP re elbow .

## 2024-03-26 ENCOUNTER — Inpatient Hospital Stay: Attending: Physician Assistant

## 2024-03-26 DIAGNOSIS — Z95828 Presence of other vascular implants and grafts: Secondary | ICD-10-CM

## 2024-03-26 DIAGNOSIS — Z87891 Personal history of nicotine dependence: Secondary | ICD-10-CM | POA: Insufficient documentation

## 2024-03-26 DIAGNOSIS — C3432 Malignant neoplasm of lower lobe, left bronchus or lung: Secondary | ICD-10-CM | POA: Diagnosis not present

## 2024-03-26 DIAGNOSIS — Z452 Encounter for adjustment and management of vascular access device: Secondary | ICD-10-CM | POA: Insufficient documentation

## 2024-03-26 MED ORDER — HEPARIN SOD (PORK) LOCK FLUSH 100 UNIT/ML IV SOLN
500.0000 [IU] | Freq: Once | INTRAVENOUS | Status: AC
  Administered 2024-03-26: 500 [IU]

## 2024-03-26 MED ORDER — SODIUM CHLORIDE 0.9% FLUSH
10.0000 mL | Freq: Once | INTRAVENOUS | Status: AC
  Administered 2024-03-26: 10 mL

## 2024-03-29 ENCOUNTER — Other Ambulatory Visit: Payer: Self-pay

## 2024-03-31 DIAGNOSIS — E663 Overweight: Secondary | ICD-10-CM | POA: Diagnosis not present

## 2024-03-31 DIAGNOSIS — M7701 Medial epicondylitis, right elbow: Secondary | ICD-10-CM | POA: Diagnosis not present

## 2024-03-31 DIAGNOSIS — Z6827 Body mass index (BMI) 27.0-27.9, adult: Secondary | ICD-10-CM | POA: Diagnosis not present

## 2024-03-31 DIAGNOSIS — M7711 Lateral epicondylitis, right elbow: Secondary | ICD-10-CM | POA: Diagnosis not present

## 2024-04-01 ENCOUNTER — Other Ambulatory Visit: Payer: Self-pay

## 2024-04-22 ENCOUNTER — Telehealth: Payer: Self-pay | Admitting: *Deleted

## 2024-04-22 NOTE — Telephone Encounter (Signed)
 Received call from pt stating that he has appts next week for lab/CT & see Dr Marguerita Shih.  He doesn't think he needs appt for Dr until after CT.  Reviewed notes & agree.  Message sent to scheduler to move MD appt to 1 wk after CT.

## 2024-04-23 ENCOUNTER — Other Ambulatory Visit: Payer: Self-pay

## 2024-04-29 ENCOUNTER — Ambulatory Visit (HOSPITAL_COMMUNITY)
Admission: RE | Admit: 2024-04-29 | Discharge: 2024-04-29 | Disposition: A | Discharge status: Home / Self Care | Source: Ambulatory Visit | Attending: Physician Assistant | Admitting: Physician Assistant

## 2024-04-29 ENCOUNTER — Ambulatory Visit: Admitting: Internal Medicine

## 2024-04-29 ENCOUNTER — Ambulatory Visit (HOSPITAL_BASED_OUTPATIENT_CLINIC_OR_DEPARTMENT_OTHER)

## 2024-04-29 ENCOUNTER — Inpatient Hospital Stay: Attending: Physician Assistant

## 2024-04-29 DIAGNOSIS — Z85118 Personal history of other malignant neoplasm of bronchus and lung: Secondary | ICD-10-CM | POA: Diagnosis not present

## 2024-04-29 DIAGNOSIS — Z95828 Presence of other vascular implants and grafts: Secondary | ICD-10-CM

## 2024-04-29 DIAGNOSIS — Z87891 Personal history of nicotine dependence: Secondary | ICD-10-CM | POA: Diagnosis not present

## 2024-04-29 DIAGNOSIS — C3432 Malignant neoplasm of lower lobe, left bronchus or lung: Secondary | ICD-10-CM

## 2024-04-29 DIAGNOSIS — J811 Chronic pulmonary edema: Secondary | ICD-10-CM | POA: Diagnosis not present

## 2024-04-29 DIAGNOSIS — K5730 Diverticulosis of large intestine without perforation or abscess without bleeding: Secondary | ICD-10-CM | POA: Diagnosis not present

## 2024-04-29 DIAGNOSIS — J439 Emphysema, unspecified: Secondary | ICD-10-CM | POA: Diagnosis not present

## 2024-04-29 LAB — CMP (CANCER CENTER ONLY)
ALT: 23 U/L (ref 0–44)
AST: 20 U/L (ref 15–41)
Albumin: 3.6 g/dL (ref 3.5–5.0)
Alkaline Phosphatase: 81 U/L (ref 38–126)
Anion gap: 9 (ref 5–15)
BUN: 14 mg/dL (ref 6–20)
CO2: 24 mmol/L (ref 22–32)
Calcium: 9 mg/dL (ref 8.9–10.3)
Chloride: 106 mmol/L (ref 98–111)
Creatinine: 1.34 mg/dL — ABNORMAL HIGH (ref 0.61–1.24)
GFR, Estimated: >60 mL/min (ref >60)
Glucose, Bld: 88 mg/dL (ref 70–99)
Potassium: 4.1 mmol/L (ref 3.5–5.1)
Sodium: 139 mmol/L (ref 135–145)
Total Bilirubin: 0.6 mg/dL (ref 0.0–1.2)
Total Protein: 6.8 g/dL (ref 6.5–8.1)

## 2024-04-29 LAB — CBC WITH DIFFERENTIAL (CANCER CENTER ONLY)
Abs Immature Granulocytes: 0.07 10*3/uL (ref 0.00–0.07)
Basophils Absolute: 0.1 10*3/uL (ref 0.0–0.1)
Basophils Relative: 1 %
Eosinophils Absolute: 0.2 10*3/uL (ref 0.0–0.5)
Eosinophils Relative: 4 %
HCT: 44.5 % (ref 39.0–52.0)
Hemoglobin: 14.6 g/dL (ref 13.0–17.0)
Immature Granulocytes: 1 %
Lymphocytes Relative: 22 %
Lymphs Abs: 1.3 10*3/uL (ref 0.7–4.0)
MCH: 28.7 pg (ref 26.0–34.0)
MCHC: 32.8 g/dL (ref 30.0–36.0)
MCV: 87.6 fL (ref 80.0–100.0)
Monocytes Absolute: 0.7 10*3/uL (ref 0.1–1.0)
Monocytes Relative: 12 %
Neutro Abs: 3.4 10*3/uL (ref 1.7–7.7)
Neutrophils Relative %: 60 %
Platelet Count: 200 10*3/uL (ref 150–400)
RBC: 5.08 MIL/uL (ref 4.22–5.81)
RDW: 14.1 % (ref 11.5–15.5)
WBC Count: 5.7 10*3/uL (ref 4.0–10.5)
nRBC: 0 % (ref 0.0–0.2)

## 2024-04-29 LAB — POCT I-STAT CREATININE: Creatinine, Ser: 1.3 mg/dL — ABNORMAL HIGH (ref 0.61–1.24)

## 2024-04-29 MED ORDER — SODIUM CHLORIDE 0.9% FLUSH
10.0000 mL | Freq: Once | INTRAVENOUS | Status: AC
  Administered 2024-04-29: 10 mL

## 2024-04-29 MED ORDER — IOHEXOL 300 MG/ML  SOLN
100.0000 mL | Freq: Once | INTRAMUSCULAR | Status: AC | PRN
Start: 2024-04-29 — End: 2024-04-29
  Administered 2024-04-29: 100 mL via INTRAVENOUS

## 2024-05-07 ENCOUNTER — Inpatient Hospital Stay: Attending: Physician Assistant | Admitting: Internal Medicine

## 2024-05-07 VITALS — BP 111/76 | HR 63 | Temp 97.6°F | Resp 16 | Ht 70.0 in | Wt 183.5 lb

## 2024-05-07 DIAGNOSIS — C3490 Malignant neoplasm of unspecified part of unspecified bronchus or lung: Secondary | ICD-10-CM

## 2024-05-07 DIAGNOSIS — Z9221 Personal history of antineoplastic chemotherapy: Secondary | ICD-10-CM | POA: Insufficient documentation

## 2024-05-07 DIAGNOSIS — Z85118 Personal history of other malignant neoplasm of bronchus and lung: Secondary | ICD-10-CM | POA: Diagnosis not present

## 2024-05-07 DIAGNOSIS — Z923 Personal history of irradiation: Secondary | ICD-10-CM | POA: Insufficient documentation

## 2024-05-07 NOTE — Progress Notes (Signed)
 [facility] [phone]   [phone]  OFFICE PROGRESS NOTE  Minus Amel, MD [address] [place] [address]  DIAGNOSIS:  Stage IV (T2b, N2, M1 B) non-small cell lung cancer, adenocarcinoma presented with large central left lower lobe lung mass with left hilar and mediastinal lymphadenopathy as well as abdominal retroperitoneal lymphadenopathy diagnosed in May 2022.  The patient also has bilateral parotid gland nodules that need close monitoring.  [facility] MOLECULAR STUDY: Results with Therapy Associations BIOMARKER METHOD ANALYTE RESULT THERAPY ASSOCIATION BIOMARKER LEVEL* .PD-L1 (22c3) IHC Protein Positive, TPS: 50% BENEFIT cemiplimab, pembrolizumab  Level 1 .PD-L1 (28-8) IHC Protein Positive  1+, 50% BENEFIT nivolumab/ipilimumab combination Level 1 .TMB Seq DNA-Tumor High, 18 mut/Mb BENEFIT pembrolizumab  Level 2 . alectinib, ceritinib, crizotinib, lorlatinib Level 1 . IHC Protein Negative  0 brigatinib Level 2 . ALK Seq RNA-Tumor Fusion Not Detected LACK OF BENEFIT alectinib, brigatinib, ceritinib, crizotinib, lorlatinib Level 2 .BRAF Seq DNA-Tumor Mutation Not Detected LACK OF BENEFIT dabrafenib and trametinib combination therapy, vemurafenib Level 2 .EGFR Seq DNA-Tumor Mutation Not Detected LACK OF BENEFIT erlotinib, gefitinib Level 2 .KRAS Seq DNA-Tumor Mutation Not Detected LACK OF BENEFIT sotorasib Level 2 .RET Seq RNA-Tumor Fusion Not Detected LACK OF BENEFIT pralsetinib, selpercatinib Level 2 .ROS1 Seq RNA-Tumor Fusion Not Detected LACK OF BENEFIT ceritinib, crizotinib, entrectinib, lorlatinib Level 2 . CNA-Seq DNA-Tumor Amplification Not Detected .MET Seq RNA-Tumor Variant Transcript Not Detected LACK OF BENEFIT crizotinib Level 3  PRIOR THERAPY:  1) Palliative radiotherapy to the large central left lower lobe lung mass under the care of Dr. Jeryl Moris. 2) Systemic chemotherapy with carboplatin  for AUC of 5,  Alimta  500 Mg/M2 and possibly Keytruda  200 Mg IV every 3 weeks.  First dose expected June 14, 2021.  Starting from cycle #5 the patient is on maintenance treatment with Alimta  and Keytruda  every 3 weeks.  Status post 35 cycles of treatment.  Starting from cycle #12 the dose of Alimta  to 400 Mg/M2 because of his increasing fatigue and side effects.  Alimta  was discontinued secondary to renal insufficiency.  CURRENT THERAPY: Observation.  INTERVAL HISTORY: Cameron Harper 48 y.o. male returns to the clinic today for follow-up visit. Discussed the use of AI scribe software for clinical note transcription with the patient, who gave verbal consent to proceed.  History of Present Illness   Cameron Harper is a 48 year old male with stage four non-small cell lung cancer who presents for evaluation and repeat CT scan.  He was diagnosed with stage four non-small cell lung cancer, adenocarcinoma, in May 2022. Initially, he underwent systemic chemotherapy with carboplatin , Alimta , and Keytruda  for four cycles. He has since been on maintenance therapy with Alimta  and Keytruda , and subsequently on single-agent Keytruda  for a total of 35 cycles. He feels 'pretty good' overall.  He experiences a 'nagging ache' on the right side of his lower back, which he initially thought was muscular. The ache bothers him when lying down or bending over extensively. This pain is not on the same side as a previous kidney lesion, which was on the left side.  He has not had a colonoscopy before, despite being 48 years old. He has been informed of some inflammation in the colon, but he has not experienced any symptoms related to this.  He is currently on observation and has been for more than a year. He is scheduled for a follow-up in six months, with a repeat scan and lab work a week prior  to the visit.        MEDICAL HISTORY: Past Medical History:  Diagnosis Date   Chronic back pain    Eczema    Lung cancer (HCC)      ALLERGIES:  is allergic to penicillins and zofran  [ondansetron ].  MEDICATIONS:  Current Outpatient Medications  Medication Sig Dispense Refill   acetaminophen  (TYLENOL ) 500 MG tablet Take 500-1,000 mg by mouth every 6 (six) hours as needed (for back pain.). (Patient not taking: Reported on 02/13/2024)     lidocaine -prilocaine  (EMLA ) cream Apply 1 application topically as needed. (Patient not taking: Reported on 05/30/2023) 30 g 2   oseltamivir  (TAMIFLU ) 75 MG capsule Take 1 capsule (75 mg total) by mouth every 12 (twelve) hours. (Patient not taking: Reported on 02/13/2024) 10 capsule 0   prochlorperazine  (COMPAZINE ) 10 MG tablet Take 1 tablet (10 mg total) by mouth every 6 (six) hours as needed for nausea or vomiting. (Patient not taking: Reported on 05/30/2023) 30 tablet 0   triamcinolone  cream (KENALOG ) 0.1 % Apply 1 Application topically 2 (two) times daily as needed. 30 g 0   No current facility-administered medications for this visit.    SURGICAL HISTORY:  Past Surgical History:  Procedure Laterality Date   BRONCHIAL BRUSHINGS  05/12/2021   Procedure: BRONCHIAL BRUSHINGS;  Surgeon: Prudy Brownie, DO;  Location: [facility];  Service: Pulmonary;;   BRONCHIAL DILITATION  05/12/2021   Procedure: BRONCHIAL DILITATION;  Surgeon: Prudy Brownie, DO;  Location: [facility];  Service: Pulmonary;;   BRONCHIAL NEEDLE ASPIRATION BIOPSY  05/12/2021   Procedure: BRONCHIAL NEEDLE ASPIRATION BIOPSIES;  Surgeon: Prudy Brownie, DO;  Location: [facility];  Service: Pulmonary;;   BRONCHIAL WASHINGS  05/12/2021   Procedure: BRONCHIAL WASHINGS;  Surgeon: Prudy Brownie, DO;  Location: [facility];  Service: Pulmonary;;   CRYOTHERAPY  05/12/2021   Procedure: CRYOTHERAPY;  Surgeon: Prudy Brownie, DO;  Location: [facility];  Service: Pulmonary;;   HEMOSTASIS CONTROL  05/12/2021   Procedure: HEMOSTASIS CONTROL;  Surgeon: Prudy Brownie, DO;  Location: [facility];  Service:  Pulmonary;;  epi injection   IR IMAGING GUIDED PORT INSERTION  06/29/2021   IR RADIOLOGIST EVAL & MGMT  11/25/2023   IR RADIOLOGIST EVAL & MGMT  02/12/2024   PORTACATH PLACEMENT     RADIOLOGY WITH ANESTHESIA N/A 01/01/2024   Procedure: CT CRYOABLATIONWITH ANESTHESIA;  Surgeon: Myrlene Asper, DO;  Location: [facility];  Service: Anesthesiology;  Laterality: N/A;   Spine injection     Pain control   VIDEO BRONCHOSCOPY WITH ENDOBRONCHIAL ULTRASOUND N/A 05/12/2021   Procedure: VIDEO BRONCHOSCOPY WITH ENDOBRONCHIAL ULTRASOUND;  Surgeon: Prudy Brownie, DO;  Location: [facility];  Service: Pulmonary;  Laterality: N/A;   WISDOM TOOTH EXTRACTION     no anesthesia involed    REVIEW OF SYSTEMS:  Constitutional: negative Eyes: negative Ears, nose, mouth, throat, and face: negative Respiratory: negative Cardiovascular: negative Gastrointestinal: negative Genitourinary:negative Integument/breast: negative Hematologic/lymphatic: negative Musculoskeletal:positive for back pain Neurological: negative Behavioral/Psych: negative Endocrine: negative Allergic/Immunologic: negative   PHYSICAL EXAMINATION: General appearance: alert, cooperative, and no distress Head: Normocephalic, without obvious abnormality, atraumatic Neck: no adenopathy, no JVD, supple, symmetrical, trachea midline, and thyroid  not enlarged, symmetric, no tenderness/mass/nodules Lymph nodes: Cervical, supraclavicular, and axillary nodes normal. Resp: clear to auscultation bilaterally Back: symmetric, no curvature. ROM normal. No CVA tenderness. Cardio: regular rate and rhythm, S1, S2 normal, no murmur, click, rub or gallop GI: soft, non-tender; bowel sounds normal; no masses,  no organomegaly Extremities: extremities  normal, atraumatic, no cyanosis or edema Neurologic: Alert and oriented X 3, normal strength and tone. Normal symmetric reflexes. Normal coordination and gait  ECOG PERFORMANCE STATUS: 0 - Asymptomatic  Blood  pressure 111/76, pulse 63, temperature 97.6 F (36.4 C), temperature source Temporal, resp. rate 16, height 5\' 10"  (1.778 m), weight 183 lb 8 oz (83.2 kg), SpO2 99%.  LABORATORY DATA: Lab Results  Component Value Date   WBC 5.7 04/29/2024   HGB 14.6 04/29/2024   HCT 44.5 04/29/2024   MCV 87.6 04/29/2024   PLT 200 04/29/2024      Chemistry      Component Value Date/Time   NA 139 04/29/2024 0928   K 4.1 04/29/2024 0928   CL 106 04/29/2024 0928   CO2 24 04/29/2024 0928   BUN 14 04/29/2024 0928   CREATININE 1.30 (H) 04/29/2024 1055   CREATININE 1.34 (H) 04/29/2024 0928      Component Value Date/Time   CALCIUM 9.0 04/29/2024 0928   ALKPHOS 81 04/29/2024 0928   AST 20 04/29/2024 0928   ALT 23 04/29/2024 0928   BILITOT 0.6 04/29/2024 0928       RADIOGRAPHIC STUDIES: CT CHEST ABDOMEN PELVIS W CONTRAST Result Date: 04/29/2024 CLINICAL DATA:  History of non-small cell lung cancer, left renal ablation. Follow-up. * Tracking Code: BO * EXAM: CT CHEST, ABDOMEN, AND PELVIS WITH CONTRAST TECHNIQUE: Multidetector CT imaging of the chest, abdomen and pelvis was performed following the standard protocol during bolus administration of intravenous contrast. RADIATION DOSE REDUCTION: This exam was performed according to the departmental dose-optimization program which includes automated exposure control, adjustment of the mA and/or kV according to patient size and/or use of iterative reconstruction technique. CONTRAST:  OMNIPAQUE  IOHEXOL  300 MG/ML  SOLN COMPARISON:  Multiple priors including CT January 13, 2024. FINDINGS: CT CHEST FINDINGS Cardiovascular: Accessed right chest Port-A-Cath with tip at the superior cavoatrial junction. Aortic atherosclerosis. Normal size heart. Trace pericardial effusion. Mediastinum/Nodes: No suspicious thyroid  nodule. No pathologically enlarged mediastinal, hilar or axillary lymph nodes. Similar soft tissue/fluid along the left hilum. Lungs/Pleura: Left  perihilar predominant linear opacity with architectural distortion bronchiectasis/bronchiolectasis is similar to prior compatible with post treatment change. No new suspicious nodularity in the treatment bed. Similar partially loculated small left pleural effusion. No new suspicious pulmonary nodules or masses. Scattered atelectasis/scarring. Emphysema. Musculoskeletal: No aggressive lytic or blastic lesion of bone. Probable post radiation change in the midthoracic spine. CT ABDOMEN PELVIS FINDINGS Hepatobiliary: No suspicious hepatic lesion. Gallbladder is unremarkable. No biliary ductal dilation. Pancreas: No pancreatic ductal dilation or evidence of acute inflammation. Spleen: No splenomegaly. Adrenals/Urinary Tract: Bilateral adrenal glands appear normal. No hydronephrosis. Decreased size of the heterogeneous area along the left interpolar kidney now measuring 3.2 x 1.1 cm on image 71/2 previously 4.6 x 2.6 cm no new suspicious enhancing soft tissue nodularity. Stomach/Bowel: Colonic diverticulosis. Short segment wall thickening of the sigmoid colon without significant adjacent inflammatory stranding on image 97/2. Vascular/Lymphatic: Normal caliber abdominal aorta. Smooth IVC contours. The portal, splenic and superior mesenteric veins are patent. No pathologically enlarged abdominal or pelvic lymph nodes. Reproductive: Prostate is unremarkable. Other: No significant abdominopelvic free fluid. Musculoskeletal: No aggressive lytic or blastic lesion of bone. IMPRESSION: 1. Stable post treatment changes in the left lung without evidence of local recurrence. 2. Decreased size of the heterogeneous area along the left interpolar kidney, compatible with evolving post ablation change. 3. No evidence of new or progressive metastatic disease in the chest, abdomen or pelvis. 4. Short segment  wall thickening of the sigmoid colon without significant adjacent inflammatory stranding, possibly reflecting sequela of prior  diverticulitis. Consider correlation with colonoscopy if not recently performed. 5. Aortic Atherosclerosis (ICD10-I70.0) and Emphysema (ICD10-J43.9). Electronically Signed   By: Tama Fails M.D.   On: 04/29/2024 15:17    ASSESSMENT AND PLAN: This is a very pleasant 48 years old white male recently diagnosed with a stage IV (T2b, N2, M1 B) non-small cell lung cancer, adenocarcinoma presented with large central left lower lobe lung mass in addition to left hilar and mediastinal lymphadenopathy as well as retroperitoneal abdominal lymph node diagnosed in May 2022.  The patient underwent palliative radiotherapy to the large left lower lobe lung mass under the care of of Dr. Jeryl Moris.  He is completed this course of treatment on June 12, 2021. MRI of the brain showed no evidence of metastatic disease to the brain. His molecular studies by [facility] showed no actionable mutations and PD-L1 expression of 50%. He underwent systemic chemotherapy with carboplatin  for AUC of 5, Alimta  500 Mg/M2 and Keytruda  200 Mg IV every 3 weeks.  Starting from cycle #5 he is on maintenance treatment with Alimta  at 400 Mg/M2 and Keytruda  200 Mg IV every 3 weeks status post 35 cycles.  The last few cycles were given with only single agent Keytruda .   The patient is currently on observation and he is feeling fine with no concerning complaints except for mild right flank pain. He had repeat CT scan of the chest, abdomen and pelvis performed recently.  I personally independently reviewed the scan and discussed the result with the patient today.  His scan showed no concerning findings for disease progression Assessment and Plan    Stage 4 non-small cell lung cancer Stage 4 non-small cell lung cancer, adenocarcinoma subtype, diagnosed in May 2022. No actionable mutations identified. He has been on maintenance therapy with Alimta  and Keytruda , and currently on single-agent Keytruda  for a total of 35 cycles. Recent CT scan shows well-managed  lung nodules with no significant changes. Reports a nagging ache on the right side, not related to previous kidney lesion ablation on the left side. Overall, the disease is well-managed with no new concerning findings on imaging. The kidney lesion post-ablation is shrinking as expected. - Continue single-agent Keytruda  therapy. - Schedule follow-up CT scan of the chest, abdomen, and pelvis in six months. - Instruct to report any new symptoms such as hemoptysis or increased dyspnea. - Follow up with Dr. Mabel Savage regarding kidney lesion ablation.  Inflammation of colon Inflammation noted in the colon on recent imaging. He has never had a colonoscopy, which is overdue as he is 48 years old. The inflammation is not the primary reason for recommending a colonoscopy; it is due to age-appropriate screening guidelines. - Refer to gastroenterologist for routine colonoscopy screening.   The patient was advised to call immediately if he has any concerning symptoms in the interval. The patient voices understanding of current disease status and treatment options and is in agreement with the current care plan. All questions were answered. The patient knows to call the clinic with any problems, questions or concerns. We can certainly see the patient much sooner if necessary. The total time spent in the appointment was 30 minutes.  Disclaimer: This note was dictated with voice recognition software. Similar sounding words can inadvertently be transcribed and may not be corrected upon review.

## 2024-05-10 ENCOUNTER — Other Ambulatory Visit: Payer: Self-pay

## 2024-06-16 ENCOUNTER — Telehealth: Payer: Self-pay | Admitting: Medical Oncology

## 2024-06-16 NOTE — Telephone Encounter (Signed)
 Message sent to schedule port flushes. Pt asking about f/u after ablation. Message sent to Franky Rakers, PA

## 2024-06-17 ENCOUNTER — Encounter: Payer: Self-pay | Admitting: Medical Oncology

## 2024-06-17 ENCOUNTER — Other Ambulatory Visit: Payer: Self-pay

## 2024-06-17 DIAGNOSIS — C642 Malignant neoplasm of left kidney, except renal pelvis: Secondary | ICD-10-CM

## 2024-06-24 ENCOUNTER — Ambulatory Visit
Admission: RE | Admit: 2024-06-24 | Discharge: 2024-06-24 | Disposition: A | Discharge status: Home / Self Care | Source: Ambulatory Visit

## 2024-06-24 DIAGNOSIS — C642 Malignant neoplasm of left kidney, except renal pelvis: Secondary | ICD-10-CM

## 2024-06-24 NOTE — Evaluation (Signed)
 Virtual Visit via Telephone Note  I connected with Cameron Harper on @TODAY @ at  9:00 AM EDT by telephone and verified that I am speaking with the correct person using two identifiers.  Location: Patient: Home Provider: [facility]   I discussed the limitations, risks, security and privacy concerns of performing an evaluation and management service by telephone and the availability of in person appointments. I also discussed with the patient that there may be a patient responsible charge related to this service. The patient expressed understanding and agreed to proceed.   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     Observations/Objective: CT CAP performed on 04/29/24 was reviewed and independently interpreted by me prior to calling the patient to discuss the results.  Assessment and Plan: The treated region of the left kidney demonstrates no growth, no new abnormal enhancement and appears to be smaller in size when compared to the previous CT chest, abdomen and pelvis from 10/01/23 which was also reviewed and independently interpreted by me.  The Cr is elevated at 1.34 when reviewing the CMP from 04/29/24, but remains stable when compared to the previous Cr values over the last year.  No new or ongoing symptoms that could be related to the renal lesion or procedure site.  Follow Up Instructions: We will obtain a repeat CTA abdomen attn kidneys in one year and repeat BMP with a follow up office visit or virtual phone visit.   I discussed the assessment and treatment plan with the patient. The patient was provided an opportunity to ask questions and all were answered. The patient agreed with the plan and demonstrated an understanding of the instructions.   The patient was advised to call back or seek an in-person evaluation if the symptoms worsen or if the condition fails to improve as  anticipated.  I provided 15 minutes of non-face-to-face time during this encounter.   Cordella DELENA Banner, MD

## 2024-06-25 ENCOUNTER — Inpatient Hospital Stay: Attending: Physician Assistant

## 2024-07-10 ENCOUNTER — Telehealth: Payer: Self-pay

## 2024-07-10 NOTE — Telephone Encounter (Signed)
 error

## 2024-07-10 NOTE — Telephone Encounter (Signed)
 Received VM from patient stating he had some questions and wanted a call back.    Tried to reach patient in regards to VM.  LVM for a return call.

## 2024-07-10 NOTE — Telephone Encounter (Signed)
 Spoke with the patient regarding new symptoms. Patient reported intermittent right-sided flank pain that has been occurring over the past few months. He stated the pain worsens with certain activities, leading him to believe it may be a pulled muscle.  Over the past week, the patient has also noticed a tingling sensation in the middle of his back on both sides, which comes and goes. He denied any current pain, difficulty breathing, or recent heavy lifting. Patient does not feel the symptoms are urgent but described the sensation as unusual and wanted to inform our office.  Advised patient I would relay the information to Dr. Sherrod for review on Monday. Patient voiced understanding.

## 2024-07-14 ENCOUNTER — Telehealth: Payer: Self-pay | Admitting: Medical Oncology

## 2024-07-14 ENCOUNTER — Encounter: Payer: Self-pay | Admitting: Internal Medicine

## 2024-07-14 ENCOUNTER — Encounter: Payer: Self-pay | Admitting: Physician Assistant

## 2024-07-14 NOTE — Telephone Encounter (Signed)
 Patient called stating he was returning a phone call. Informed patient that there is no record of a call placed to him today. Advised patient that Dr. Sherrod recommended, regarding his flank pain, it may be appropriate to follow up with urology. Additionally, if symptoms worsen, repeat imaging may be needed sooner. Patient Cameron Harper) stated that his pain is not worsening at this time. He said he will f/u with urology.

## 2024-07-28 ENCOUNTER — Telehealth: Payer: Self-pay | Admitting: Medical Oncology

## 2024-07-28 NOTE — Telephone Encounter (Signed)
 Patient reports persistent right flank pain x 4 weeks. It has migrated to the front. Describes the sensation as a muscle ache in the upper back with accompanying tingling, which has now progressed to a cramping feeling. Pain is intermittent, occurring frequently throughout the day. Patient took Tylenol  1000 mg  , which provided temporary relief;  however, the pain returns the following day. Denies urinary symptoms, GI symptoms or change in appetite.  Next scan is expected for November.  Cameron Harper is asking to consider moving up scan.

## 2024-07-30 ENCOUNTER — Encounter: Payer: Self-pay | Admitting: Medical Oncology

## 2024-07-31 ENCOUNTER — Telehealth: Payer: Self-pay | Admitting: Medical Oncology

## 2024-07-31 NOTE — Telephone Encounter (Signed)
 CT scan date moved up. Pt will call to schedule scan.

## 2024-08-13 ENCOUNTER — Inpatient Hospital Stay: Attending: Physician Assistant

## 2024-08-13 ENCOUNTER — Inpatient Hospital Stay

## 2024-08-13 ENCOUNTER — Ambulatory Visit (HOSPITAL_COMMUNITY)
Admission: RE | Admit: 2024-08-13 | Discharge: 2024-08-13 | Disposition: A | Discharge status: Home / Self Care | Source: Ambulatory Visit | Attending: Internal Medicine | Admitting: Internal Medicine

## 2024-08-13 ENCOUNTER — Other Ambulatory Visit: Payer: Self-pay | Admitting: Internal Medicine

## 2024-08-13 ENCOUNTER — Other Ambulatory Visit

## 2024-08-13 DIAGNOSIS — J439 Emphysema, unspecified: Secondary | ICD-10-CM | POA: Diagnosis not present

## 2024-08-13 DIAGNOSIS — Z87891 Personal history of nicotine dependence: Secondary | ICD-10-CM | POA: Insufficient documentation

## 2024-08-13 DIAGNOSIS — C3432 Malignant neoplasm of lower lobe, left bronchus or lung: Secondary | ICD-10-CM | POA: Diagnosis not present

## 2024-08-13 DIAGNOSIS — C3490 Malignant neoplasm of unspecified part of unspecified bronchus or lung: Secondary | ICD-10-CM

## 2024-08-13 DIAGNOSIS — J90 Pleural effusion, not elsewhere classified: Secondary | ICD-10-CM | POA: Diagnosis not present

## 2024-08-13 DIAGNOSIS — I700 Atherosclerosis of aorta: Secondary | ICD-10-CM | POA: Diagnosis not present

## 2024-08-13 LAB — CBC WITH DIFFERENTIAL (CANCER CENTER ONLY)
Abs Immature Granulocytes: 0.02 K/uL (ref 0.00–0.07)
Basophils Absolute: 0.1 K/uL (ref 0.0–0.1)
Basophils Relative: 1 %
Eosinophils Absolute: 0.1 K/uL (ref 0.0–0.5)
Eosinophils Relative: 2 %
HCT: 42.6 % (ref 39.0–52.0)
Hemoglobin: 14.3 g/dL (ref 13.0–17.0)
Immature Granulocytes: 0 %
Lymphocytes Relative: 17 %
Lymphs Abs: 1.1 K/uL (ref 0.7–4.0)
MCH: 29.7 pg (ref 26.0–34.0)
MCHC: 33.6 g/dL (ref 30.0–36.0)
MCV: 88.6 fL (ref 80.0–100.0)
Monocytes Absolute: 0.5 K/uL (ref 0.1–1.0)
Monocytes Relative: 8 %
Neutro Abs: 4.4 K/uL (ref 1.7–7.7)
Neutrophils Relative %: 72 %
Platelet Count: 199 K/uL (ref 150–400)
RBC: 4.81 MIL/uL (ref 4.22–5.81)
RDW: 13.3 % (ref 11.5–15.5)
WBC Count: 6.2 K/uL (ref 4.0–10.5)
nRBC: 0 % (ref 0.0–0.2)

## 2024-08-13 LAB — CMP (CANCER CENTER ONLY)
ALT: 16 U/L (ref 0–44)
AST: 15 U/L (ref 15–41)
Albumin: 4 g/dL (ref 3.5–5.0)
Alkaline Phosphatase: 87 U/L (ref 38–126)
Anion gap: 5 (ref 5–15)
BUN: 15 mg/dL (ref 6–20)
CO2: 29 mmol/L (ref 22–32)
Calcium: 9 mg/dL (ref 8.9–10.3)
Chloride: 107 mmol/L (ref 98–111)
Creatinine: 1.3 mg/dL — ABNORMAL HIGH (ref 0.61–1.24)
GFR, Estimated: >60 mL/min (ref >60)
Glucose, Bld: 126 mg/dL — ABNORMAL HIGH (ref 70–99)
Potassium: 4.2 mmol/L (ref 3.5–5.1)
Sodium: 141 mmol/L (ref 135–145)
Total Bilirubin: 0.4 mg/dL (ref 0.0–1.2)
Total Protein: 6.8 g/dL (ref 6.5–8.1)

## 2024-08-13 MED ORDER — HEPARIN SOD (PORK) LOCK FLUSH 100 UNIT/ML IV SOLN
500.0000 [IU] | Freq: Once | INTRAVENOUS | Status: AC
Start: 2024-08-13 — End: 2024-08-13
  Administered 2024-08-13: 500 [IU] via INTRAVENOUS

## 2024-08-13 MED ORDER — IOHEXOL 300 MG/ML  SOLN
100.0000 mL | Freq: Once | INTRAMUSCULAR | Status: AC | PRN
  Administered 2024-08-13: 100 mL via INTRAVENOUS

## 2024-09-08 ENCOUNTER — Encounter: Payer: Self-pay | Admitting: Medical Oncology

## 2024-09-22 ENCOUNTER — Inpatient Hospital Stay: Admitting: Internal Medicine

## 2024-09-23 ENCOUNTER — Telehealth: Payer: Self-pay | Admitting: Internal Medicine

## 2024-09-23 NOTE — Telephone Encounter (Signed)
 Left the patient a voicemail with the scheduled appointment details per staff message. The patient is active on MyChart.

## 2024-09-24 ENCOUNTER — Telehealth: Payer: Self-pay

## 2024-09-24 NOTE — Telephone Encounter (Signed)
 S/w representative at [facility] regarding clearance to place patient on doxycycline for 30-60 days to treat periodontal disease. Dr. Sherrod aware and agreeable. Representative made aware and will inform Dr. Myra that he can proceed with prescribing the antibiotics.

## 2024-09-28 ENCOUNTER — Inpatient Hospital Stay: Admitting: Internal Medicine

## 2024-09-28 ENCOUNTER — Telehealth: Payer: Self-pay | Admitting: *Deleted

## 2024-09-28 NOTE — Telephone Encounter (Signed)
 Cameron Harper called and left VM: He does not feel well today. Has sore throat and temperature. He wants to cancel today's appt with Dr. Sherrod and reschedule. Appts canceled and schedule message sent. Contacted patient and left VM with this information.

## 2024-10-01 ENCOUNTER — Inpatient Hospital Stay

## 2024-10-01 DIAGNOSIS — J060 Acute laryngopharyngitis: Secondary | ICD-10-CM | POA: Diagnosis not present

## 2024-10-01 DIAGNOSIS — Z85118 Personal history of other malignant neoplasm of bronchus and lung: Secondary | ICD-10-CM | POA: Diagnosis not present

## 2024-10-06 ENCOUNTER — Telehealth: Payer: Self-pay | Admitting: *Deleted

## 2024-10-06 NOTE — Telephone Encounter (Signed)
 Pt. called to cancel port flush on 10/08/24 and add port flush before MD visit 10/21/24. Appt. changed and pt. aware.

## 2024-10-08 ENCOUNTER — Inpatient Hospital Stay

## 2024-10-19 DIAGNOSIS — R1310 Dysphagia, unspecified: Secondary | ICD-10-CM | POA: Diagnosis not present

## 2024-10-19 DIAGNOSIS — J312 Chronic pharyngitis: Secondary | ICD-10-CM | POA: Diagnosis not present

## 2024-10-21 ENCOUNTER — Telehealth: Payer: Self-pay | Admitting: Internal Medicine

## 2024-10-21 ENCOUNTER — Inpatient Hospital Stay

## 2024-10-21 ENCOUNTER — Inpatient Hospital Stay: Attending: Physician Assistant | Admitting: Internal Medicine

## 2024-10-21 VITALS — BP 107/63 | HR 69 | Temp 97.7°F | Resp 17 | Ht 70.0 in | Wt 183.0 lb

## 2024-10-21 DIAGNOSIS — C3490 Malignant neoplasm of unspecified part of unspecified bronchus or lung: Secondary | ICD-10-CM

## 2024-10-21 DIAGNOSIS — J312 Chronic pharyngitis: Secondary | ICD-10-CM | POA: Diagnosis not present

## 2024-10-21 DIAGNOSIS — Z87891 Personal history of nicotine dependence: Secondary | ICD-10-CM | POA: Insufficient documentation

## 2024-10-21 DIAGNOSIS — C3432 Malignant neoplasm of lower lobe, left bronchus or lung: Secondary | ICD-10-CM | POA: Diagnosis not present

## 2024-10-21 NOTE — Telephone Encounter (Signed)
 Scheduled patient for next appointment. Called and spoke with the patients son, he is aware.

## 2024-10-21 NOTE — Progress Notes (Signed)
 [facility] [phone]   [phone]  OFFICE PROGRESS NOTE  [name], MD [address] [place] [address]  DIAGNOSIS:  Stage IV (T2b, N2, M1 B) non-small cell lung cancer, adenocarcinoma presented with large central left lower lobe lung mass with left hilar and mediastinal lymphadenopathy as well as abdominal retroperitoneal lymphadenopathy diagnosed in May 2022.  The patient also has bilateral parotid gland nodules that need close monitoring.  [facility] MOLECULAR STUDY: Results with Therapy Associations BIOMARKER METHOD ANALYTE RESULT THERAPY ASSOCIATION BIOMARKER LEVEL* .PD-L1 (22c3) IHC Protein Positive, TPS: 50% BENEFIT cemiplimab, pembrolizumab  Level 1 .PD-L1 (28-8) IHC Protein Positive  1+, 50% BENEFIT nivolumab/ipilimumab combination Level 1 .TMB Seq DNA-Tumor High, 18 mut/Mb BENEFIT pembrolizumab  Level 2 . alectinib, ceritinib, crizotinib, lorlatinib Level 1 . IHC Protein Negative  0 brigatinib Level 2 . ALK Seq RNA-Tumor Fusion Not Detected LACK OF BENEFIT alectinib, brigatinib, ceritinib, crizotinib, lorlatinib Level 2 .BRAF Seq DNA-Tumor Mutation Not Detected LACK OF BENEFIT dabrafenib and trametinib combination therapy, vemurafenib Level 2 .EGFR Seq DNA-Tumor Mutation Not Detected LACK OF BENEFIT erlotinib, gefitinib Level 2 .KRAS Seq DNA-Tumor Mutation Not Detected LACK OF BENEFIT sotorasib Level 2 .RET Seq RNA-Tumor Fusion Not Detected LACK OF BENEFIT pralsetinib, selpercatinib Level 2 .ROS1 Seq RNA-Tumor Fusion Not Detected LACK OF BENEFIT ceritinib, crizotinib, entrectinib, lorlatinib Level 2 . CNA-Seq DNA-Tumor Amplification Not Detected .MET Seq RNA-Tumor Variant Transcript Not Detected LACK OF BENEFIT crizotinib Level 3  PRIOR THERAPY:  1) Palliative radiotherapy to the large central left lower lobe lung mass under the care of Dr. Dewey. 2) Systemic chemotherapy with carboplatin  for AUC of 5, Alimta  500  Mg/M2 and possibly Keytruda  200 Mg IV every 3 weeks.  First dose expected June 14, 2021.  Starting from cycle #5 the patient is on maintenance treatment with Alimta  and Keytruda  every 3 weeks.  Status post 35 cycles of treatment.  Starting from cycle #12 the dose of Alimta  to 400 Mg/M2 because of his increasing fatigue and side effects.  Alimta  was discontinued secondary to renal insufficiency.  CURRENT THERAPY: Observation.  INTERVAL HISTORY: Cameron Harper 48 y.o. male returns to the clinic today for follow-up visit.Discussed the use of AI scribe software for clinical note transcription with the patient, who gave verbal consent to proceed.  History of Present Illness Cameron Harper is a 48 year old male with a history of cancer who presents with a persistent sore throat. He was referred by his primary care doctor for evaluation of a persistent sore throat.  He underwent a CT scan of the chest, abdomen, and pelvis in September for restaging of his disease. However, he did not have a follow-up appointment scheduled until now.  He has been experiencing a persistent sore throat for a few weeks, which initially improved with antibiotics but then returned. He describes the sore throat as unusual for him, stating that he typically does not get sore throats. He has constant throat clearing and drainage. No fever, chills, or other systemic symptoms. COVID-19 and flu tests were negative.     MEDICAL HISTORY: Past Medical History:  Diagnosis Date   Chronic back pain    Eczema    Lung cancer (HCC)     ALLERGIES:  is allergic to penicillins and zofran  [ondansetron ].  MEDICATIONS:  Current Outpatient Medications  Medication Sig Dispense Refill   acetaminophen  (TYLENOL ) 500 MG tablet Take 500-1,000 mg by mouth every 6 (six) hours as needed (for  back pain.). (Patient not taking: Reported on 02/13/2024)     lidocaine -prilocaine  (EMLA ) cream Apply 1 application topically as needed. (Patient not  taking: Reported on 05/30/2023) 30 g 2   oseltamivir  (TAMIFLU ) 75 MG capsule Take 1 capsule (75 mg total) by mouth every 12 (twelve) hours. (Patient not taking: Reported on 02/13/2024) 10 capsule 0   prochlorperazine  (COMPAZINE ) 10 MG tablet Take 1 tablet (10 mg total) by mouth every 6 (six) hours as needed for nausea or vomiting. (Patient not taking: Reported on 05/30/2023) 30 tablet 0   triamcinolone  cream (KENALOG ) 0.1 % Apply 1 Application topically 2 (two) times daily as needed. 30 g 0   No current facility-administered medications for this visit.    SURGICAL HISTORY:  Past Surgical History:  Procedure Laterality Date   BRONCHIAL BRUSHINGS  05/12/2021   Procedure: BRONCHIAL BRUSHINGS;  Surgeon: Brenna Adine CROME, DO;  Location: [facility];  Service: Pulmonary;;   BRONCHIAL DILITATION  05/12/2021   Procedure: BRONCHIAL DILITATION;  Surgeon: Brenna Adine CROME, DO;  Location: [facility];  Service: Pulmonary;;   BRONCHIAL NEEDLE ASPIRATION BIOPSY  05/12/2021   Procedure: BRONCHIAL NEEDLE ASPIRATION BIOPSIES;  Surgeon: Brenna Adine CROME, DO;  Location: [facility];  Service: Pulmonary;;   BRONCHIAL WASHINGS  05/12/2021   Procedure: BRONCHIAL WASHINGS;  Surgeon: Brenna Adine CROME, DO;  Location: [facility];  Service: Pulmonary;;   CRYOTHERAPY  05/12/2021   Procedure: CRYOTHERAPY;  Surgeon: Brenna Adine CROME, DO;  Location: [facility];  Service: Pulmonary;;   HEMOSTASIS CONTROL  05/12/2021   Procedure: HEMOSTASIS CONTROL;  Surgeon: Brenna Adine CROME, DO;  Location: [facility];  Service: Pulmonary;;  epi injection   IR IMAGING GUIDED PORT INSERTION  06/29/2021   IR RADIOLOGIST EVAL & MGMT  11/25/2023   IR RADIOLOGIST EVAL & MGMT  02/12/2024   PORTACATH PLACEMENT     RADIOLOGY WITH ANESTHESIA N/A 01/01/2024   Procedure: CT CRYOABLATIONWITH ANESTHESIA;  Surgeon: Alona Corners, DO;  Location: [facility];  Service: Anesthesiology;  Laterality: N/A;   Spine injection     Pain control   VIDEO  BRONCHOSCOPY WITH ENDOBRONCHIAL ULTRASOUND N/A 05/12/2021   Procedure: VIDEO BRONCHOSCOPY WITH ENDOBRONCHIAL ULTRASOUND;  Surgeon: Brenna Adine CROME, DO;  Location: [facility];  Service: Pulmonary;  Laterality: N/A;   WISDOM TOOTH EXTRACTION     no anesthesia involed    REVIEW OF SYSTEMS:  Constitutional: positive for fatigue Eyes: negative Ears, nose, mouth, throat, and face: positive for sore throat Respiratory: negative Cardiovascular: negative Gastrointestinal: negative Genitourinary:negative Integument/breast: negative Hematologic/lymphatic: negative Musculoskeletal:negative Neurological: negative Behavioral/Psych: negative Endocrine: negative Allergic/Immunologic: negative   PHYSICAL EXAMINATION: General appearance: alert, cooperative, and no distress Head: Normocephalic, without obvious abnormality, atraumatic Neck: no adenopathy, no JVD, supple, symmetrical, trachea midline, and thyroid  not enlarged, symmetric, no tenderness/mass/nodules Lymph nodes: Cervical, supraclavicular, and axillary nodes normal. Resp: clear to auscultation bilaterally Back: symmetric, no curvature. ROM normal. No CVA tenderness. Cardio: regular rate and rhythm, S1, S2 normal, no murmur, click, rub or gallop GI: soft, non-tender; bowel sounds normal; no masses,  no organomegaly Extremities: extremities normal, atraumatic, no cyanosis or edema Neurologic: Alert and oriented X 3, normal strength and tone. Normal symmetric reflexes. Normal coordination and gait  ECOG PERFORMANCE STATUS: 0 - Asymptomatic  Blood pressure 107/63, pulse 69, temperature 97.7 F (36.5 C), temperature source Temporal, resp. rate 17, height 5' 10 (1.778 m), weight 183 lb (83 kg), SpO2 99%.  LABORATORY DATA: Lab Results  Component Value Date   WBC 6.2 08/13/2024  HGB 14.3 08/13/2024   HCT 42.6 08/13/2024   MCV 88.6 08/13/2024   PLT 199 08/13/2024      Chemistry      Component Value Date/Time   NA 141 08/13/2024  1147   K 4.2 08/13/2024 1147   CL 107 08/13/2024 1147   CO2 29 08/13/2024 1147   BUN 15 08/13/2024 1147   CREATININE 1.30 (H) 08/13/2024 1147      Component Value Date/Time   CALCIUM 9.0 08/13/2024 1147   ALKPHOS 87 08/13/2024 1147   AST 15 08/13/2024 1147   ALT 16 08/13/2024 1147   BILITOT 0.4 08/13/2024 1147       RADIOGRAPHIC STUDIES: No results found.   ASSESSMENT AND PLAN: This is a very pleasant 48 years old white male recently diagnosed with a stage IV (T2b, N2, M1 B) non-small cell lung cancer, adenocarcinoma presented with large central left lower lobe lung mass in addition to left hilar and mediastinal lymphadenopathy as well as retroperitoneal abdominal lymph node diagnosed in May 2022.  The patient underwent palliative radiotherapy to the large left lower lobe lung mass under the care of of Dr. Dewey.  He is completed this course of treatment on June 12, 2021. MRI of the brain showed no evidence of metastatic disease to the brain. His molecular studies by [facility] showed no actionable mutations and PD-L1 expression of 50%. He underwent systemic chemotherapy with carboplatin  for AUC of 5, Alimta  500 Mg/M2 and Keytruda  200 Mg IV every 3 weeks.  Starting from cycle #5 he is on maintenance treatment with Alimta  at 400 Mg/M2 and Keytruda  200 Mg IV every 3 weeks status post 35 cycles.  The last few cycles were given with only single agent Keytruda .   The patient is currently on observation and he is feeling fine with no concerning complaints except for mild right flank pain. He had repeat CT scan of the chest, abdomen performed few weeks ago.  I personally independently reviewed the scan and discussed the result with the patient today.  His scan showed no concerning findings for disease progression. Assessment and Plan Assessment & Plan History of malignant neoplasm under surveillance Under surveillance for malignant neoplasm. Recent CT scan of chest, abdomen, and pelvis in September  showed no concerning findings. Good response to previous treatment. No new symptoms suggestive of disease progression. - Will order repeat CT scan of chest, abdomen, pelvis, and neck in one month to ensure no new findings.  Chronic sore throat and throat clearing Persisting for several weeks. Initial treatment with antibiotics provided temporary relief. Symptoms suggestive of viral etiology, possibly exacerbated by recent cold. No fever or chills. Negative COVID and flu tests. Concern for potential throat cancer due to cancer history, but recent scan did not include neck area. - Recommended use of cough drops and warm liquids for symptomatic relief. - Advised ENT evaluation for laryngoscopy if symptoms persist or worsen. - Will include neck in next CT scan to rule out any concerning findings. The patient was advised to call immediately if he has any concerning symptoms in the interval.  The patient voices understanding of current disease status and treatment options and is in agreement with the current care plan. All questions were answered. The patient knows to call the clinic with any problems, questions or concerns. We can certainly see the patient much sooner if necessary. The total time spent in the appointment was 30 minutes.  Disclaimer: This note was dictated with voice recognition software. Similar sounding words  can inadvertently be transcribed and may not be corrected upon review.

## 2024-10-22 ENCOUNTER — Other Ambulatory Visit: Payer: Self-pay

## 2024-10-27 ENCOUNTER — Other Ambulatory Visit

## 2024-10-30 ENCOUNTER — Encounter (INDEPENDENT_AMBULATORY_CARE_PROVIDER_SITE_OTHER): Payer: Self-pay

## 2024-11-11 ENCOUNTER — Ambulatory Visit: Admitting: Internal Medicine

## 2024-11-11 ENCOUNTER — Ambulatory Visit (HOSPITAL_COMMUNITY)
Admission: RE | Admit: 2024-11-11 | Discharge: 2024-11-11 | Disposition: A | Discharge status: Home / Self Care | Source: Ambulatory Visit | Attending: Internal Medicine | Admitting: Internal Medicine

## 2024-11-11 ENCOUNTER — Inpatient Hospital Stay

## 2024-11-11 ENCOUNTER — Inpatient Hospital Stay: Attending: Physician Assistant

## 2024-11-11 DIAGNOSIS — C3432 Malignant neoplasm of lower lobe, left bronchus or lung: Secondary | ICD-10-CM | POA: Insufficient documentation

## 2024-11-11 DIAGNOSIS — Z87891 Personal history of nicotine dependence: Secondary | ICD-10-CM | POA: Diagnosis not present

## 2024-11-11 DIAGNOSIS — C3490 Malignant neoplasm of unspecified part of unspecified bronchus or lung: Secondary | ICD-10-CM

## 2024-11-11 DIAGNOSIS — K118 Other diseases of salivary glands: Secondary | ICD-10-CM | POA: Diagnosis not present

## 2024-11-11 LAB — CMP (CANCER CENTER ONLY)
ALT: 22 U/L (ref 0–44)
AST: 20 U/L (ref 15–41)
Albumin: 4 g/dL (ref 3.5–5.0)
Alkaline Phosphatase: 92 U/L (ref 38–126)
Anion gap: 8 (ref 5–15)
BUN: 16 mg/dL (ref 6–20)
CO2: 26 mmol/L (ref 22–32)
Calcium: 9.1 mg/dL (ref 8.9–10.3)
Chloride: 106 mmol/L (ref 98–111)
Creatinine: 1.18 mg/dL (ref 0.61–1.24)
GFR, Estimated: >60 mL/min (ref >60)
Glucose, Bld: 100 mg/dL — ABNORMAL HIGH (ref 70–99)
Potassium: 4.1 mmol/L (ref 3.5–5.1)
Sodium: 140 mmol/L (ref 135–145)
Total Bilirubin: 0.3 mg/dL (ref 0.0–1.2)
Total Protein: 7 g/dL (ref 6.5–8.1)

## 2024-11-11 LAB — CBC WITH DIFFERENTIAL (CANCER CENTER ONLY)
Abs Immature Granulocytes: 0.01 K/uL (ref 0.00–0.07)
Basophils Absolute: 0.1 K/uL (ref 0.0–0.1)
Basophils Relative: 1 %
Eosinophils Absolute: 0.2 K/uL (ref 0.0–0.5)
Eosinophils Relative: 3 %
HCT: 42.7 % (ref 39.0–52.0)
Hemoglobin: 14.2 g/dL (ref 13.0–17.0)
Immature Granulocytes: 0 %
Lymphocytes Relative: 21 %
Lymphs Abs: 1.1 K/uL (ref 0.7–4.0)
MCH: 29.7 pg (ref 26.0–34.0)
MCHC: 33.3 g/dL (ref 30.0–36.0)
MCV: 89.3 fL (ref 80.0–100.0)
Monocytes Absolute: 0.6 K/uL (ref 0.1–1.0)
Monocytes Relative: 11 %
Neutro Abs: 3.3 K/uL (ref 1.7–7.7)
Neutrophils Relative %: 64 %
Platelet Count: 206 K/uL (ref 150–400)
RBC: 4.78 MIL/uL (ref 4.22–5.81)
RDW: 13.2 % (ref 11.5–15.5)
WBC Count: 5.2 K/uL (ref 4.0–10.5)
nRBC: 0 % (ref 0.0–0.2)

## 2024-11-11 MED ORDER — HEPARIN SOD (PORK) LOCK FLUSH 100 UNIT/ML IV SOLN
500.0000 [IU] | Freq: Once | INTRAVENOUS | Status: AC
  Administered 2024-11-11: 500 [IU] via INTRAVENOUS

## 2024-11-11 MED ORDER — HEPARIN SOD (PORK) LOCK FLUSH 100 UNIT/ML IV SOLN
INTRAVENOUS | Status: AC
  Filled 2024-11-11: qty 5

## 2024-11-11 MED ORDER — SODIUM CHLORIDE (PF) 0.9 % IJ SOLN
INTRAMUSCULAR | Status: AC
  Filled 2024-11-11: qty 50

## 2024-11-11 MED ORDER — IOHEXOL 300 MG/ML  SOLN
100.0000 mL | Freq: Once | INTRAMUSCULAR | Status: AC | PRN
  Administered 2024-11-11: 100 mL via INTRAVENOUS

## 2024-11-18 ENCOUNTER — Telehealth: Payer: Self-pay | Admitting: Internal Medicine

## 2024-11-18 ENCOUNTER — Inpatient Hospital Stay: Admitting: Internal Medicine

## 2024-11-18 VITALS — BP 128/73 | HR 66 | Temp 98.2°F | Resp 17 | Ht 71.0 in | Wt 189.0 lb

## 2024-11-18 DIAGNOSIS — C3490 Malignant neoplasm of unspecified part of unspecified bronchus or lung: Secondary | ICD-10-CM

## 2024-11-18 DIAGNOSIS — C3432 Malignant neoplasm of lower lobe, left bronchus or lung: Secondary | ICD-10-CM | POA: Diagnosis not present

## 2024-11-18 NOTE — Progress Notes (Signed)
 [facility] [phone]   [phone]  OFFICE PROGRESS NOTE  Cameron Rush, MD [address] [place] [address]  DIAGNOSIS:  Stage IV (T2b, N2, M1 B) non-small cell lung cancer, adenocarcinoma presented with large central left lower lobe lung mass with left hilar and mediastinal lymphadenopathy as well as abdominal retroperitoneal lymphadenopathy diagnosed in May 2022.  The patient also has bilateral parotid gland nodules that need close monitoring.  CARIS MOLECULAR STUDY: Results with Therapy Associations BIOMARKER METHOD ANALYTE RESULT THERAPY ASSOCIATION BIOMARKER LEVEL* .PD-L1 (22c3) IHC Protein Positive, TPS: 50% BENEFIT cemiplimab, pembrolizumab  Level 1 .PD-L1 (28-8) IHC Protein Positive  1+, 50% BENEFIT nivolumab/ipilimumab combination Level 1 .TMB Seq DNA-Tumor High, 18 mut/Mb BENEFIT pembrolizumab  Level 2 . alectinib, ceritinib, crizotinib, lorlatinib Level 1 . IHC Protein Negative  0 brigatinib Level 2 . ALK Seq RNA-Tumor Fusion Not Detected LACK OF BENEFIT alectinib, brigatinib, ceritinib, crizotinib, lorlatinib Level 2 .BRAF Seq DNA-Tumor Mutation Not Detected LACK OF BENEFIT dabrafenib and trametinib combination therapy, vemurafenib Level 2 .EGFR Seq DNA-Tumor Mutation Not Detected LACK OF BENEFIT erlotinib, gefitinib Level 2 .KRAS Seq DNA-Tumor Mutation Not Detected LACK OF BENEFIT sotorasib Level 2 .RET Seq RNA-Tumor Fusion Not Detected LACK OF BENEFIT pralsetinib, selpercatinib Level 2 .ROS1 Seq RNA-Tumor Fusion Not Detected LACK OF BENEFIT ceritinib, crizotinib, entrectinib, lorlatinib Level 2 . CNA-Seq DNA-Tumor Amplification Not Detected .MET Seq RNA-Tumor Variant Transcript Not Detected LACK OF BENEFIT crizotinib Level 3  PRIOR THERAPY:  1) Palliative radiotherapy to the large central left lower lobe lung mass under the care of Dr. Dewey. 2) Systemic chemotherapy with carboplatin  for AUC of 5, Alimta  500  Mg/M2 and possibly Keytruda  200 Mg IV every 3 weeks.  First dose expected June 14, 2021.  Starting from cycle #5 the patient is on maintenance treatment with Alimta  and Keytruda  every 3 weeks.  Status post 35 cycles of treatment.  Starting from cycle #12 the dose of Alimta  to 400 Mg/M2 because of his increasing fatigue and side effects.  Alimta  was discontinued secondary to renal insufficiency.  CURRENT THERAPY: Observation.  INTERVAL HISTORY: Cameron Harper 48 y.o. male returns to the clinic today for follow-up visit.Discussed the use of AI scribe software for clinical note transcription with the patient, who gave verbal consent to proceed.  History of Present Illness Cameron Harper is a 48 year old male with stage IV non-small cell lung adenocarcinoma who presents for routine follow-up and re-staging imaging.  Diagnosed with stage IV non-small cell lung adenocarcinoma in May 2022, he completed two years of maintenance pembrolizumab  and has been on observation for approximately 18 months. He is currently asymptomatic, denying chest pain, dyspnea, cough, headaches, nausea, vomiting, or diarrhea. A prior episode of sore throat has resolved without intervention. No new or concerning symptoms have developed since the last visit.  He continues to feel well.  He has an indwelling Restenza port, which is flushed every two months for maintenance.    MEDICAL HISTORY: Past Medical History:  Diagnosis Date   Chronic back pain    Eczema    Lung cancer (HCC)     ALLERGIES:  is allergic to penicillins and zofran  [ondansetron ].  MEDICATIONS:  Current Outpatient Medications  Medication Sig Dispense Refill   acetaminophen  (TYLENOL ) 500 MG tablet Take 500-1,000 mg by mouth every 6 (six) hours as needed (for back pain.). (Patient not taking: Reported on 02/13/2024)     lidocaine -prilocaine  (EMLA ) cream Apply 1 application topically as  needed. (Patient not taking: Reported on 05/30/2023) 30 g 2    oseltamivir  (TAMIFLU ) 75 MG capsule Take 1 capsule (75 mg total) by mouth every 12 (twelve) hours. (Patient not taking: Reported on 02/13/2024) 10 capsule 0   prochlorperazine  (COMPAZINE ) 10 MG tablet Take 1 tablet (10 mg total) by mouth every 6 (six) hours as needed for nausea or vomiting. (Patient not taking: Reported on 05/30/2023) 30 tablet 0   triamcinolone  cream (KENALOG ) 0.1 % Apply 1 Application topically 2 (two) times daily as needed. 30 g 0   No current facility-administered medications for this visit.    SURGICAL HISTORY:  Past Surgical History:  Procedure Laterality Date   BRONCHIAL BRUSHINGS  05/12/2021   Procedure: BRONCHIAL BRUSHINGS;  Surgeon: Brenna Adine CROME, DO;  Location: [facility];  Service: Pulmonary;;   BRONCHIAL DILITATION  05/12/2021   Procedure: BRONCHIAL DILITATION;  Surgeon: Brenna Adine CROME, DO;  Location: [facility];  Service: Pulmonary;;   BRONCHIAL NEEDLE ASPIRATION BIOPSY  05/12/2021   Procedure: BRONCHIAL NEEDLE ASPIRATION BIOPSIES;  Surgeon: Brenna Adine CROME, DO;  Location: [facility];  Service: Pulmonary;;   BRONCHIAL WASHINGS  05/12/2021   Procedure: BRONCHIAL WASHINGS;  Surgeon: Brenna Adine CROME, DO;  Location: [facility];  Service: Pulmonary;;   CRYOTHERAPY  05/12/2021   Procedure: CRYOTHERAPY;  Surgeon: Brenna Adine CROME, DO;  Location: [facility];  Service: Pulmonary;;   HEMOSTASIS CONTROL  05/12/2021   Procedure: HEMOSTASIS CONTROL;  Surgeon: Brenna Adine CROME, DO;  Location: [facility];  Service: Pulmonary;;  epi injection   IR IMAGING GUIDED PORT INSERTION  06/29/2021   IR RADIOLOGIST EVAL & MGMT  11/25/2023   IR RADIOLOGIST EVAL & MGMT  02/12/2024   PORTACATH PLACEMENT     RADIOLOGY WITH ANESTHESIA N/A 01/01/2024   Procedure: CT CRYOABLATIONWITH ANESTHESIA;  Surgeon: Alona Corners, DO;  Location: [facility];  Service: Anesthesiology;  Laterality: N/A;   Spine injection     Pain control   VIDEO BRONCHOSCOPY WITH ENDOBRONCHIAL ULTRASOUND N/A  05/12/2021   Procedure: VIDEO BRONCHOSCOPY WITH ENDOBRONCHIAL ULTRASOUND;  Surgeon: Brenna Adine CROME, DO;  Location: [facility];  Service: Pulmonary;  Laterality: N/A;   WISDOM TOOTH EXTRACTION     no anesthesia involed    REVIEW OF SYSTEMS:  Constitutional: negative Eyes: negative Ears, nose, mouth, throat, and face: negative Respiratory: negative Cardiovascular: negative Gastrointestinal: negative Genitourinary:negative Integument/breast: negative Hematologic/lymphatic: negative Musculoskeletal:negative Neurological: negative Behavioral/Psych: negative Endocrine: negative Allergic/Immunologic: negative   PHYSICAL EXAMINATION: General appearance: alert, cooperative, and no distress Head: Normocephalic, without obvious abnormality, atraumatic Neck: no adenopathy, no JVD, supple, symmetrical, trachea midline, and thyroid  not enlarged, symmetric, no tenderness/mass/nodules Lymph nodes: Cervical, supraclavicular, and axillary nodes normal. Resp: clear to auscultation bilaterally Back: symmetric, no curvature. ROM normal. No CVA tenderness. Cardio: regular rate and rhythm, S1, S2 normal, no murmur, click, rub or gallop GI: soft, non-tender; bowel sounds normal; no masses,  no organomegaly Extremities: extremities normal, atraumatic, no cyanosis or edema Neurologic: Alert and oriented X 3, normal strength and tone. Normal symmetric reflexes. Normal coordination and gait  ECOG PERFORMANCE STATUS: 0 - Asymptomatic  Blood pressure 128/73, pulse 66, temperature 98.2 F (36.8 C), temperature source Temporal, resp. rate 17, height 5' 11 (1.803 m), weight 189 lb (85.7 kg), SpO2 100%.  LABORATORY DATA: Lab Results  Component Value Date   WBC 5.2 11/11/2024   HGB 14.2 11/11/2024   HCT 42.7 11/11/2024   MCV 89.3 11/11/2024   PLT 206 11/11/2024      Chemistry  Component Value Date/Time   NA 140 11/11/2024 0917   K 4.1 11/11/2024 0917   CL 106 11/11/2024 0917   CO2 26  11/11/2024 0917   BUN 16 11/11/2024 0917   CREATININE 1.18 11/11/2024 0917      Component Value Date/Time   CALCIUM 9.1 11/11/2024 0917   ALKPHOS 92 11/11/2024 0917   AST 20 11/11/2024 0917   ALT 22 11/11/2024 0917   BILITOT 0.3 11/11/2024 0917       RADIOGRAPHIC STUDIES: CT Soft Tissue Neck W Contrast Result Date: 11/13/2024 CLINICAL DATA:  Stage IV lung cancer, parotid nodules EXAM: CT NECK WITH CONTRAST TECHNIQUE: Multidetector CT imaging of the neck was performed using the standard protocol following the bolus administration of intravenous contrast. RADIATION DOSE REDUCTION: This exam was performed according to the departmental dose-optimization program which includes automated exposure control, adjustment of the mA and/or kV according to patient size and/or use of iterative reconstruction technique. CONTRAST:  OMNIPAQUE  IOHEXOL  300 MG/ML  SOLN COMPARISON:  MRI June 02, 2021 FINDINGS: Pharynx: The nasopharynx, oropharynx and hypopharynx are normal Oral cavity/floor of mouth: Normal Larynx: Normal Salivary glands: The parotid and submandibular glands are normal Thyroid : Normal Lymph nodes: No adenopathy Vascular: No significant abnormality Limited intracranial: No significant abnormality Visualized orbits: No significant abnormality Mastoids and visualized paranasal sinuses: There is opacification of the right maxillary sinus. Skeleton: No significant abnormality Upper chest: There is post radiation change in the medial left lung Other: None IMPRESSION: 1. The parotid glands are normal. There were also normal on the previous brain MRI from June 02, 2021 2. No adenopathy or abnormal mass Electronically Signed   By: Nancyann Burns M.D.   On: 11/13/2024 16:30   CT CHEST ABDOMEN PELVIS W CONTRAST Result Date: 11/11/2024 CLINICAL DATA:  Non-small cell lung cancer (NSCLC), staging. * Tracking Code: BO * EXAM: CT CHEST, ABDOMEN, AND PELVIS WITH CONTRAST TECHNIQUE: Multidetector CT imaging of the  chest, abdomen and pelvis was performed following the standard protocol during bolus administration of intravenous contrast. RADIATION DOSE REDUCTION: This exam was performed according to the departmental dose-optimization program which includes automated exposure control, adjustment of the mA and/or kV according to patient size and/or use of iterative reconstruction technique. CONTRAST:  OMNIPAQUE  IOHEXOL  300 MG/ML  SOLN COMPARISON:  CT scan chest, abdomen and pelvis from 08/13/2024. FINDINGS: CT CHEST FINDINGS Cardiovascular: Normal cardiac size. No pericardial effusion. No aortic aneurysm. There are minimal peripheral atherosclerotic vascular calcifications of thoracic aorta and its major branches. Mediastinum/Nodes: Visualized thyroid  gland appears grossly unremarkable. No solid / cystic mediastinal masses. The esophagus is nondistended precluding optimal assessment. No axillary, mediastinal or hilar lymphadenopathy by size criteria. Lungs/Pleura: The central tracheo-bronchial tree is patent. Redemonstration of focal scarring of the left lung, along the mediastinal margin and associated bronchiectasis. There is also stable small-to-moderate loculated pleural effusion in the left lower hemithorax. Minimal upper lobe predominant centrilobular emphysematous changes noted. Linear area of atelectasis/scarring noted in the right lung lower lobe. No mass, consolidation or right pleural effusion. No suspicious lung nodule. Musculoskeletal: A CT Port-a-Cath is seen in the right upper chest wall with the catheter terminating in the cavo-atrial junction region. Visualized soft tissues of the chest wall are otherwise grossly unremarkable. No suspicious osseous lesions. There are mild multilevel degenerative changes in the visualized spine. CT ABDOMEN PELVIS FINDINGS Hepatobiliary: The liver is normal in size. Non-cirrhotic configuration. No suspicious mass. These is mild diffuse hepatic steatosis. No intrahepatic or  extrahepatic bile duct  dilation. No calcified gallstones. Normal gallbladder wall thickness. No pericholecystic inflammatory changes. Pancreas: Unremarkable. No pancreatic ductal dilatation or surrounding inflammatory changes. Spleen: Within normal limits. No focal lesion. Adrenals/Urinary Tract: Adrenal glands are unremarkable. Redemonstration of focal irregular soft tissue along the left kidney lower pole, posterolaterally which corresponds to post ablation changes and essentially unchanged since the prior study. However, there is redemonstration of well-circumscribed 1.4 x 2.0 cm hyperattenuating structure in the left kidney upper pole, medially which is present on multiple prior studies and favored to represent proteinaceous/hemorrhagic cyst. No new suspicious renal lesion on either side. No nephroureterolithiasis or obstructive uropathy. Urinary bladder is under distended, precluding optimal assessment. However, no large mass or stones identified. No perivesical fat stranding. Stomach/Bowel: No disproportionate dilation of the small or large bowel loops. No evidence of abnormal bowel wall thickening or inflammatory changes. The appendix is unremarkable. There are multiple diverticula mainly in the sigmoid colon, without imaging signs of diverticulitis. Vascular/Lymphatic: No ascites or pneumoperitoneum. No abdominal or pelvic lymphadenopathy, by size criteria. No aneurysmal dilation of the major abdominal arteries. Reproductive: Normal size prostate. Symmetric seminal vesicles. Other: There are fat containing umbilical and bilateral inguinal hernias. The soft tissues and abdominal wall are otherwise unremarkable. Musculoskeletal: No suspicious osseous lesions. There are mild multilevel degenerative changes in the visualized spine. IMPRESSION: 1. Redemonstration of post ablation changes in the left kidney lower pole. No new suspicious renal lesion. 2. No metastatic disease identified within the chest, abdomen or  pelvis. 3. Multiple other nonacute observations, as described above. Aortic Atherosclerosis (ICD10-I70.0) and Emphysema (ICD10-J43.9). Electronically Signed   By: Ree Molt M.D.   On: 11/11/2024 17:36     ASSESSMENT AND PLAN: This is a very pleasant 48 years old white male recently diagnosed with a stage IV (T2b, N2, M1 B) non-small cell lung cancer, adenocarcinoma presented with large central left lower lobe lung mass in addition to left hilar and mediastinal lymphadenopathy as well as retroperitoneal abdominal lymph node diagnosed in May 2022.  The patient underwent palliative radiotherapy to the large left lower lobe lung mass under the care of of Dr. Dewey.  He is completed this course of treatment on June 12, 2021. MRI of the brain showed no evidence of metastatic disease to the brain. His molecular studies by CARIS showed no actionable mutations and PD-L1 expression of 50%. He underwent systemic chemotherapy with carboplatin  for AUC of 5, Alimta  500 Mg/M2 and Keytruda  200 Mg IV every 3 weeks.  Starting from cycle #5 he is on maintenance treatment with Alimta  at 400 Mg/M2 and Keytruda  200 Mg IV every 3 weeks status post 35 cycles.  The last few cycles were given with only single agent Keytruda .   The patient is currently on observation and he is feeling fine with no concerning complaints. He had repeat CT scan of the neck, chest, abdomen and pelvis performed recently.  I personally independently reviewed the scan and discussed the result with the patient today.  His scan showed no concerning findings for disease progression. Assessment and Plan Assessment & Plan Stage IV non-small cell lung adenocarcinoma Stage IV non-small cell lung adenocarcinoma in remission following two years of maintenance pembrolizumab , with no actionable mutations and negative PD-L1 expression. He has been under observation for approximately 18 months, remains asymptomatic, and recent CT imaging of the chest, abdomen,  pelvis, and neck showed no evidence of disease progression. - Reviewed recent CT imaging confirming no evidence of disease progression. - Recommended continued observation with follow-up  in four months for the next one to two visits, with consideration to extend to six-month intervals if disease remains stable.  Indwelling venous access device management Restenza port in place from prior therapy, not currently in use. - Instructed to have the port flushed every two months to maintain patency. He was advised to call immediately if he has any other concerning symptoms in the interval. The patient voices understanding of current disease status and treatment options and is in agreement with the current care plan. All questions were answered. The patient knows to call the clinic with any problems, questions or concerns. We can certainly see the patient much sooner if necessary. The total time spent in the appointment was 30 minutes including review of chart and various tests results, discussions about plan of care and coordination of care plan .   Disclaimer: This note was dictated with voice recognition software. Similar sounding words can inadvertently be transcribed and may not be corrected upon review.

## 2024-11-18 NOTE — Telephone Encounter (Signed)
 Scheduled patient for next appointments. Called and spoke with the patient, went over all the appointments I made, he is aware.

## 2024-11-20 ENCOUNTER — Other Ambulatory Visit: Payer: Self-pay

## 2025-01-19 ENCOUNTER — Inpatient Hospital Stay: Attending: Physician Assistant

## 2025-03-09 ENCOUNTER — Inpatient Hospital Stay

## 2025-03-17 ENCOUNTER — Inpatient Hospital Stay: Admitting: Internal Medicine

## 2025-05-19 ENCOUNTER — Inpatient Hospital Stay: Attending: Physician Assistant

## 2025-07-19 ENCOUNTER — Inpatient Hospital Stay: Attending: Physician Assistant

## 2025-09-20 ENCOUNTER — Inpatient Hospital Stay: Attending: Physician Assistant

## 2025-11-22 ENCOUNTER — Inpatient Hospital Stay: Attending: Physician Assistant
